# Patient Record
Sex: Female | Born: 1970
Health system: Southern US, Community
[De-identification: ages and names within clinical notes are randomized; demographics above are authoritative.]

## PROBLEM LIST (undated history)

## (undated) DIAGNOSIS — E876 Hypokalemia: Secondary | ICD-10-CM

## (undated) DIAGNOSIS — K219 Gastro-esophageal reflux disease without esophagitis: Secondary | ICD-10-CM

## (undated) DIAGNOSIS — J302 Other seasonal allergic rhinitis: Secondary | ICD-10-CM

## (undated) DIAGNOSIS — Z9109 Other allergy status, other than to drugs and biological substances: Secondary | ICD-10-CM

## (undated) DIAGNOSIS — R45851 Suicidal ideations: Secondary | ICD-10-CM

## (undated) DIAGNOSIS — N739 Female pelvic inflammatory disease, unspecified: Secondary | ICD-10-CM

## (undated) DIAGNOSIS — F419 Anxiety disorder, unspecified: Secondary | ICD-10-CM

## (undated) DIAGNOSIS — J45909 Unspecified asthma, uncomplicated: Secondary | ICD-10-CM

## (undated) DIAGNOSIS — G4730 Sleep apnea, unspecified: Secondary | ICD-10-CM

## (undated) DIAGNOSIS — F32A Depression, unspecified: Secondary | ICD-10-CM

## (undated) DIAGNOSIS — N879 Dysplasia of cervix uteri, unspecified: Secondary | ICD-10-CM

## (undated) DIAGNOSIS — N390 Urinary tract infection, site not specified: Secondary | ICD-10-CM

## (undated) DIAGNOSIS — B019 Varicella without complication: Secondary | ICD-10-CM

## (undated) DIAGNOSIS — I10 Essential (primary) hypertension: Secondary | ICD-10-CM

## (undated) DIAGNOSIS — R51 Headache: Secondary | ICD-10-CM

## (undated) DIAGNOSIS — J982 Interstitial emphysema: Secondary | ICD-10-CM

## (undated) DIAGNOSIS — T8859XA Other complications of anesthesia, initial encounter: Secondary | ICD-10-CM

## (undated) DIAGNOSIS — Z8489 Family history of other specified conditions: Secondary | ICD-10-CM

## (undated) DIAGNOSIS — F329 Major depressive disorder, single episode, unspecified: Secondary | ICD-10-CM

## (undated) HISTORY — PX: ABLATION: SHX5711

## (undated) HISTORY — DX: Essential (primary) hypertension: I10

## (undated) HISTORY — DX: Headache: R51

## (undated) HISTORY — DX: Other allergy status, other than to drugs and biological substances: Z91.09

## (undated) HISTORY — PX: REDUCTION MAMMAPLASTY: SUR839

## (undated) HISTORY — DX: Urinary tract infection, site not specified: N39.0

## (undated) HISTORY — PX: TUBAL LIGATION: SHX77

## (undated) HISTORY — DX: Other seasonal allergic rhinitis: J30.2

## (undated) HISTORY — DX: Varicella without complication: B01.9

## (undated) HISTORY — DX: Female pelvic inflammatory disease, unspecified: N73.9

---

## 2011-04-14 HISTORY — PX: BREAST BIOPSY: SHX20

## 2011-12-09 ENCOUNTER — Inpatient Hospital Stay (HOSPITAL_COMMUNITY): Payer: 59

## 2011-12-09 ENCOUNTER — Inpatient Hospital Stay (HOSPITAL_COMMUNITY)
Admission: EM | Admit: 2011-12-09 | Discharge: 2011-12-11 | DRG: 202 | Disposition: A | Discharge status: Home / Self Care | Payer: 59 | Attending: Internal Medicine | Admitting: Internal Medicine

## 2011-12-09 ENCOUNTER — Emergency Department (HOSPITAL_COMMUNITY): Payer: 59

## 2011-12-09 ENCOUNTER — Encounter (HOSPITAL_COMMUNITY): Payer: Self-pay | Admitting: Emergency Medicine

## 2011-12-09 DIAGNOSIS — E872 Acidosis, unspecified: Secondary | ICD-10-CM | POA: Diagnosis present

## 2011-12-09 DIAGNOSIS — J982 Interstitial emphysema: Secondary | ICD-10-CM

## 2011-12-09 DIAGNOSIS — F341 Dysthymic disorder: Secondary | ICD-10-CM

## 2011-12-09 DIAGNOSIS — R000 Tachycardia, unspecified: Secondary | ICD-10-CM

## 2011-12-09 DIAGNOSIS — J069 Acute upper respiratory infection, unspecified: Secondary | ICD-10-CM | POA: Diagnosis present

## 2011-12-09 DIAGNOSIS — Z880 Allergy status to penicillin: Secondary | ICD-10-CM

## 2011-12-09 DIAGNOSIS — F329 Major depressive disorder, single episode, unspecified: Secondary | ICD-10-CM | POA: Diagnosis present

## 2011-12-09 DIAGNOSIS — J45901 Unspecified asthma with (acute) exacerbation: Principal | ICD-10-CM | POA: Diagnosis present

## 2011-12-09 DIAGNOSIS — F411 Generalized anxiety disorder: Secondary | ICD-10-CM | POA: Diagnosis present

## 2011-12-09 DIAGNOSIS — F3289 Other specified depressive episodes: Secondary | ICD-10-CM | POA: Diagnosis present

## 2011-12-09 DIAGNOSIS — F419 Anxiety disorder, unspecified: Secondary | ICD-10-CM | POA: Diagnosis present

## 2011-12-09 DIAGNOSIS — Z833 Family history of diabetes mellitus: Secondary | ICD-10-CM

## 2011-12-09 DIAGNOSIS — E876 Hypokalemia: Secondary | ICD-10-CM

## 2011-12-09 DIAGNOSIS — R0600 Dyspnea, unspecified: Secondary | ICD-10-CM

## 2011-12-09 DIAGNOSIS — Z8249 Family history of ischemic heart disease and other diseases of the circulatory system: Secondary | ICD-10-CM

## 2011-12-09 DIAGNOSIS — Z882 Allergy status to sulfonamides status: Secondary | ICD-10-CM

## 2011-12-09 HISTORY — DX: Anxiety disorder, unspecified: F41.9

## 2011-12-09 HISTORY — DX: Depression, unspecified: F32.A

## 2011-12-09 HISTORY — DX: Major depressive disorder, single episode, unspecified: F32.9

## 2011-12-09 HISTORY — DX: Hypokalemia: E87.6

## 2011-12-09 HISTORY — DX: Unspecified asthma, uncomplicated: J45.909

## 2011-12-09 LAB — CBC WITH DIFFERENTIAL/PLATELET
Basophils Absolute: 0 10*3/uL (ref 0.0–0.1)
Basophils Relative: 0 % (ref 0–1)
Eosinophils Absolute: 0 K/uL (ref 0.0–0.7)
Eosinophils Relative: 0 % (ref 0–5)
HCT: 41.1 % (ref 36.0–46.0)
Hemoglobin: 14.4 g/dL (ref 12.0–15.0)
Lymphocytes Relative: 3 % — ABNORMAL LOW (ref 12–46)
Lymphs Abs: 0.4 K/uL — ABNORMAL LOW (ref 0.7–4.0)
MCH: 29.8 pg (ref 26.0–34.0)
MCHC: 35 g/dL (ref 30.0–36.0)
MCV: 84.9 fL (ref 78.0–100.0)
Monocytes Absolute: 0 K/uL — ABNORMAL LOW (ref 0.1–1.0)
Monocytes Relative: 0 % — ABNORMAL LOW (ref 3–12)
Neutro Abs: 13.3 10*3/uL — ABNORMAL HIGH (ref 1.7–7.7)
Neutrophils Relative %: 97 % — ABNORMAL HIGH (ref 43–77)
Platelets: 313 10*3/uL (ref 150–400)
RBC: 4.84 MIL/uL (ref 3.87–5.11)
RDW: 12.7 % (ref 11.5–15.5)
WBC: 13.7 10*3/uL — ABNORMAL HIGH (ref 4.0–10.5)

## 2011-12-09 LAB — POCT I-STAT 3, ART BLOOD GAS (G3+)
Acid-base deficit: 6 mmol/L — ABNORMAL HIGH (ref 0.0–2.0)
Bicarbonate: 21.3 mEq/L (ref 20.0–24.0)
O2 Saturation: 90 %
TCO2: 23 mmol/L (ref 0–100)
pCO2 arterial: 46 mmHg — ABNORMAL HIGH (ref 35.0–45.0)
pH, Arterial: 7.273 — ABNORMAL LOW (ref 7.350–7.450)
pO2, Arterial: 66 mmHg — ABNORMAL LOW (ref 80.0–100.0)

## 2011-12-09 LAB — CARBOXYHEMOGLOBIN
Carboxyhemoglobin: 0.8 % (ref 0.5–1.5)
Methemoglobin: 0.9 % (ref 0.0–1.5)
O2 Saturation: 90.3 %
Total hemoglobin: 14.7 g/dL (ref 12.0–16.0)

## 2011-12-09 LAB — BASIC METABOLIC PANEL
CO2: 20 mEq/L (ref 19–32)
Chloride: 100 mEq/L (ref 96–112)
Creatinine, Ser: 0.56 mg/dL (ref 0.50–1.10)
GFR calc Af Amer: >90 mL/min (ref >90)
Sodium: 138 mEq/L (ref 135–145)

## 2011-12-09 LAB — BASIC METABOLIC PANEL WITH GFR
BUN: 7 mg/dL (ref 6–23)
Calcium: 9 mg/dL (ref 8.4–10.5)
GFR calc non Af Amer: >90 mL/min (ref >90)
Glucose, Bld: 203 mg/dL — ABNORMAL HIGH (ref 70–99)
Potassium: 2.8 meq/L — ABNORMAL LOW (ref 3.5–5.1)

## 2011-12-09 LAB — D-DIMER, QUANTITATIVE: D-Dimer, Quant: 1.01 ug/mL-FEU — ABNORMAL HIGH (ref 0.00–0.48)

## 2011-12-09 MED ORDER — POTASSIUM CHLORIDE CRYS ER 20 MEQ PO TBCR
20.0000 meq | EXTENDED_RELEASE_TABLET | Freq: Once | ORAL | Status: DC
  Filled 2011-12-09: qty 1

## 2011-12-09 MED ORDER — RACEPINEPHRINE HCL 2.25 % IN NEBU: 0.5000 mL | INHALATION_SOLUTION | Freq: Once | RESPIRATORY_TRACT | Status: DC

## 2011-12-09 MED ORDER — HYDROMORPHONE HCL PF 1 MG/ML IJ SOLN
1.0000 mg | Freq: Once | INTRAMUSCULAR | Status: AC
  Administered 2011-12-09: 1 mg via INTRAVENOUS

## 2011-12-09 MED ORDER — ALBUTEROL SULFATE (5 MG/ML) 0.5% IN NEBU
2.5000 mg | INHALATION_SOLUTION | RESPIRATORY_TRACT | Status: DC
  Filled 2011-12-09 (×2): qty 0.5

## 2011-12-09 MED ORDER — ACETAMINOPHEN 650 MG RE SUPP: 650.0000 mg | Freq: Four times a day (QID) | RECTAL | Status: DC | PRN

## 2011-12-09 MED ORDER — IPRATROPIUM BROMIDE 0.02 % IN SOLN
RESPIRATORY_TRACT | Status: AC
  Filled 2011-12-09: qty 2.5

## 2011-12-09 MED ORDER — SODIUM CHLORIDE 0.9 % IV SOLN
INTRAVENOUS | Status: DC
  Administered 2011-12-10 (×2): via INTRAVENOUS

## 2011-12-09 MED ORDER — VENLAFAXINE HCL ER 75 MG PO CP24
75.0000 mg | ORAL_CAPSULE | Freq: Every day | ORAL | Status: DC
  Filled 2011-12-09 (×2): qty 1

## 2011-12-09 MED ORDER — ENOXAPARIN SODIUM 40 MG/0.4ML ~~LOC~~ SOLN
40.0000 mg | SUBCUTANEOUS | Status: DC
  Administered 2011-12-09 – 2011-12-10 (×2): 40 mg via SUBCUTANEOUS
  Filled 2011-12-09 (×3): qty 0.4

## 2011-12-09 MED ORDER — HYDROMORPHONE HCL PF 1 MG/ML IJ SOLN
0.5000 mg | INTRAMUSCULAR | Status: DC | PRN
  Administered 2011-12-09 – 2011-12-11 (×5): 0.5 mg via INTRAVENOUS
  Filled 2011-12-09 (×5): qty 1

## 2011-12-09 MED ORDER — LORAZEPAM 2 MG/ML IJ SOLN
1.0000 mg | Freq: Once | INTRAMUSCULAR | Status: AC
  Administered 2011-12-09: 1 mg via INTRAVENOUS
  Filled 2011-12-09: qty 1

## 2011-12-09 MED ORDER — IOHEXOL 350 MG/ML SOLN
80.0000 mL | Freq: Once | INTRAVENOUS | Status: AC | PRN
Start: 2011-12-09 — End: 2011-12-09
  Administered 2011-12-09: 80 mL via INTRAVENOUS

## 2011-12-09 MED ORDER — ACETAMINOPHEN 325 MG PO TABS: 650.0000 mg | ORAL_TABLET | Freq: Four times a day (QID) | ORAL | Status: DC | PRN

## 2011-12-09 MED ORDER — POTASSIUM CHLORIDE CRYS ER 20 MEQ PO TBCR
40.0000 meq | EXTENDED_RELEASE_TABLET | Freq: Two times a day (BID) | ORAL | Status: DC
  Administered 2011-12-09: 40 meq via ORAL
  Filled 2011-12-09 (×3): qty 2

## 2011-12-09 MED ORDER — HYDROMORPHONE HCL PF 1 MG/ML IJ SOLN
0.5000 mg | INTRAMUSCULAR | Status: DC | PRN
  Filled 2011-12-09: qty 1

## 2011-12-09 MED ORDER — IPRATROPIUM BROMIDE 0.02 % IN SOLN
0.5000 mg | Freq: Four times a day (QID) | RESPIRATORY_TRACT | Status: AC
  Administered 2011-12-09 – 2011-12-10 (×2): 0.5 mg via RESPIRATORY_TRACT
  Filled 2011-12-09 (×2): qty 2.5

## 2011-12-09 MED ORDER — INFLUENZA VIRUS VACC SPLIT PF IM SUSP
0.5000 mL | INTRAMUSCULAR | Status: AC
  Administered 2011-12-10: 0.5 mL via INTRAMUSCULAR
  Filled 2011-12-09: qty 0.5

## 2011-12-09 MED ORDER — ALBUTEROL SULFATE (5 MG/ML) 0.5% IN NEBU
2.5000 mg | INHALATION_SOLUTION | Freq: Four times a day (QID) | RESPIRATORY_TRACT | Status: AC
  Administered 2011-12-09 – 2011-12-10 (×2): 2.5 mg via RESPIRATORY_TRACT
  Filled 2011-12-09 (×2): qty 0.5

## 2011-12-09 MED ORDER — RACEPINEPHRINE HCL 2.25 % IN NEBU
INHALATION_SOLUTION | RESPIRATORY_TRACT | Status: AC
  Administered 2011-12-09: 0.5 mL via RESPIRATORY_TRACT
  Filled 2011-12-09: qty 0.5

## 2011-12-09 MED ORDER — IPRATROPIUM BROMIDE 0.02 % IN SOLN
0.5000 mg | RESPIRATORY_TRACT | Status: DC
  Filled 2011-12-09 (×2): qty 2.5

## 2011-12-09 MED ORDER — IPRATROPIUM BROMIDE 0.02 % IN SOLN
0.5000 mg | RESPIRATORY_TRACT | Status: DC | PRN
  Administered 2011-12-09 (×2): 0.5 mg via RESPIRATORY_TRACT

## 2011-12-09 MED ORDER — ALBUTEROL SULFATE (5 MG/ML) 0.5% IN NEBU
2.5000 mg | INHALATION_SOLUTION | RESPIRATORY_TRACT | Status: DC | PRN
  Administered 2011-12-10 – 2011-12-11 (×2): 2.5 mg via RESPIRATORY_TRACT
  Filled 2011-12-09 (×4): qty 0.5

## 2011-12-09 MED ORDER — ALBUTEROL (5 MG/ML) CONTINUOUS INHALATION SOLN
INHALATION_SOLUTION | RESPIRATORY_TRACT | Status: AC
  Filled 2011-12-09: qty 20

## 2011-12-09 MED ORDER — ALBUTEROL SULFATE (5 MG/ML) 0.5% IN NEBU
5.0000 mg | INHALATION_SOLUTION | Freq: Once | RESPIRATORY_TRACT | Status: AC
  Administered 2011-12-09: 5 mg via RESPIRATORY_TRACT
  Filled 2011-12-09: qty 1

## 2011-12-09 MED ORDER — RACEPINEPHRINE HCL 2.25 % IN NEBU
0.5000 mL | INHALATION_SOLUTION | RESPIRATORY_TRACT | Status: AC
  Administered 2011-12-09: 0.5 mL via RESPIRATORY_TRACT

## 2011-12-09 MED ORDER — SODIUM CHLORIDE 0.9 % IV SOLN
Freq: Once | INTRAVENOUS | Status: AC
  Administered 2011-12-09: 16:00:00 via INTRAVENOUS

## 2011-12-09 MED ORDER — IPRATROPIUM BROMIDE 0.02 % IN SOLN
0.5000 mg | RESPIRATORY_TRACT | Status: DC | PRN
  Administered 2011-12-10 – 2011-12-11 (×3): 0.5 mg via RESPIRATORY_TRACT
  Filled 2011-12-09 (×3): qty 2.5

## 2011-12-09 MED ORDER — ALBUTEROL SULFATE (5 MG/ML) 0.5% IN NEBU
2.5000 mg | INHALATION_SOLUTION | RESPIRATORY_TRACT | Status: DC | PRN
  Administered 2011-12-09 (×2): 2.5 mg via RESPIRATORY_TRACT

## 2011-12-09 NOTE — ED Notes (Signed)
Pt transported to CT ?

## 2011-12-09 NOTE — ED Notes (Signed)
Called to CT ref. Pt having breathing diff.  O2 sats 86% on 3LPM .  Increased 02 to 4LPM .  Pt was able to tolerate CT. But continues to be restless.  St's she can't get enough air.

## 2011-12-09 NOTE — ED Notes (Signed)
MD at bedside. 

## 2011-12-09 NOTE — H&P (Signed)
Hospital Admission Note Date: 12/09/2011  Patient name: Nancy Barnes Medical record number: [id number] Date of birth: 08-28-1970 Age: 41 y.o. Gender: female PCP: Donzetta Sprung, MD  Medical Service: [facility]  Attending physician: Dr. Dalphine Handing  1st Contact: Dr. Heloise Beecham   Pager: [phone] 2nd Contact: Dr. Bosie Clos   Pager: [phone] After 5 pm or weekends: 1st Contact:  Intern on call   Pager: [phone] 2nd Contact:  Resident on call  Pager: [phone]  Chief Complaint: Shortness of breath  History of Present Illness: Nancy Barnes is a 41 year old female with past medical history of poorly controlled asthma as well as depression who presents with acute worsening of shortness of breath. History is limited due to patient's shortness of breath during examination. History was augmented by discussion with parents at bedside as well as outside records from [facility].  Nancy Barnes reports that she has had cold-like symptoms for the past 3 days, including stuffy nose and mild cough. Over the same period of time, she is noticed increasing shortness of breath necessitating more frequent use of Combivent inhaler for asthma. She reports that she was diagnosed with asthma 20 years ago, but has never had pulmonary function tests performed. She denies any previous hospitalizations or intubations for asthma attack. She has had several ED visits for asthma exacerbation. She reports that her asthma has been subacutely worsening over the past year, requiring her to use her Combivent inhaler now multiple times daily and even nightly during the week. She's not on any inhaled steroid maintenance therapy at home. This morning, she woke acutely short of breath around 5 AM and presented to [facility], [facility] local hospital, for evaluation. She was treated with Solu-Medrol, albuterol/ipratropium nebulizer treatments, and azithromycin. She had a chest x-ray at the time, and there were no abnormalities reported from that  film. She returned home, but continued to feel very uncomfortable and short of breath, so she presented to [facility] ED. She denies any acute chest pain, but says she feels very uncomfortable breathing. She denies any recent fever, chills, nausea, vomiting, lower chimney edema, headache, dizziness, weakness, numbness or tingling.    Meds: Current Outpatient Rx  Name  Route  Sig  Dispense  Refill  . ALBUTEROL SULFATE (2.5 MG/3ML) 0.083% IN NEBU   Nebulization   Take 2.5 mg by nebulization every 4 (four) hours as needed. For shortness of breath         . IPRATROPIUM-ALBUTEROL 18-103 MCG/ACT IN AERO   Inhalation   Inhale 2 puffs into the lungs every 6 (six) hours as needed. For shortness of breath         . DESVENLAFAXINE SUCCINATE ER 50 MG PO TB24   Oral   Take 50 mg by mouth daily.           Allergies: Allergies as of 12/09/2011 - Review Complete 12/09/2011  Allergen Reaction Noted  . Nsaids Shortness Of Breath 12/09/2011  . Fish allergy Other (See Comments) 12/09/2011  . Levaquin (levofloxacin in d5w) Other (See Comments) 12/09/2011  . Other Other (See Comments) 12/09/2011  . Penicillins Other (See Comments) 12/09/2011  . Prednisone Other (See Comments) 12/09/2011  . Sulfa antibiotics Other (See Comments) 12/09/2011   Past Medical History  Diagnosis Date  . Asthma   . Hypokalemia    Past Surgical History  Procedure Date  . Tubal ligation   . Ablation    History reviewed. No pertinent family history. History   Social History  . Marital Status: Divorced  Spouse Name: N/A    Number of Children: N/A  . Years of Education: N/A   Occupational History  . Not on file.   Social History Main Topics  . Smoking status: Not on file  . Smokeless tobacco: Not on file  . Alcohol Use: No  . Drug Use: No  . Sexually Active: Not on file   Other Topics Concern  . Not on file   Social History Narrative  . No narrative on file    Review of Systems: 10 pt ROS  performed, pertinent positives and negatives noted in HPI  Physical Exam: Blood pressure 122/99, pulse 119, resp. rate 30, last menstrual period 12/09/2006, SpO2 95.00%. Vitals reviewed. General: female sitting up in bed, leaning forward, distressed appearing, rapid breathing HEENT: PERRL, EOMI, no scleral icterus. + Neck crepitus Cardiac: tachycardic to 120s with regular rate, no rubs, murmurs or gallops Pulm: Mild accessory muscle use w retractions, prominent decreased breath sounds over entire L lung fields. R lung field with soft bilateral inspiratory and expiratory wheezes but good air movement. No stridor.  Abd: soft, nontender, nondistended, BS present Ext: warm and well perfused, no pedal edema Neuro: alert and oriented X3, cranial nerves II-XII grossly intact, strength and sensation to light touch equal in bilateral upper and lower extremities  Lab results: Basic Metabolic Panel:  Basename 12/09/11 1240  NA 138  K 2.8*  CL 100  CO2 20  GLUCOSE 203*  BUN 7  CREATININE 0.56  CALCIUM 9.0  MG --  PHOS --   CBC:  Basename 12/09/11 1240  WBC 13.7*  NEUTROABS 13.3*  HGB 14.4  HCT 41.1  MCV 84.9  PLT 313   D-Dimer:  Basename 12/09/11 1432  DDIMER 1.01*   Imaging results:  Ct Angio Chest W/cm &/or Wo Cm  12/09/2011  *RADIOLOGY REPORT*  Clinical Data: Severe shortness of breath.  Cough.  Chest pain.  CT ANGIOGRAPHY CHEST  Technique:  Multidetector CT imaging of the chest using the standard protocol during bolus administration of intravenous contrast. Multiplanar reconstructed images including MIPs were obtained and reviewed to evaluate the vascular anatomy.  Contrast: 80mL OMNIPAQUE IOHEXOL 350 MG/ML SOLN  Comparison: No prior CT.  Two-view chest x-ray [facility] and portable chest x-ray [facility], both obtained earlier same date.  Findings: Since examinations earlier today, the patient has developed extensive pneumomediastinum, with gas tracking  upward into the central neck.  I do not identify a discrete defect within the trachea, mainstem bronchi, or the visualized gas filled esophagus.  There is no mediastinal fluid.  Contrast opacification of the pulmonary arteries is very good. Respiratory motion blurred images of the lung bases.  Overall, study is of good diagnostic quality for the detection of pulmonary emboli.  No filling defects within either main pulmonary artery or their branches in either lung to suggest pulmonary embolism.  Heart size normal.  No pericardial effusion.  No convincing pneumopericardium. No visible thoracic or upper abdominal atherosclerosis.  No visible coronary calcification.  Pulmonary parenchyma clear without localized airspace consolidation, interstitial disease, or parenchymal nodules or masses.  No pleural effusions.  No pneumothorax.  Visualized upper abdomen unremarkable.  No evidence of pneumoperitoneum or pneumoretroperitoneum.  Note is made of a simple cyst in the central liver.  IMPRESSION:  1.  Interval development of extensive pneumomediastinum since the chest x-rays earlier today. There is subcutaneous emphysema in the central neck as well.  There is no evidence of pneumothorax, pneumoperitoneum, or pneumoretroperitoneum. 2.  No acute cardiopulmonary disease otherwise. 3.  No evidence of pulmonary embolism.  These results were called by telephone on 12/09/2011 at 1634 hours to Dr. Judd Lien of the emergency department, who verbally acknowledged these results.   Original Report Authenticated By: Hulan Saas, M.D.    Dg Chest Port 1 View  12/09/2011  *RADIOLOGY REPORT*  Clinical Data: Shortness of breath all day.  Mid chest pain.  PORTABLE CHEST - 1 VIEW  Comparison: Chest x-ray 12/09/2011.  Findings: Lung volumes are normal.  No consolidative airspace disease.  No pleural effusions.  No pneumothorax.  No pulmonary nodule or mass noted.  Pulmonary vasculature and the cardiomediastinal silhouette are within normal  limits.  IMPRESSION: 1. No radiographic evidence of acute cardiopulmonary disease.   Original Report Authenticated By: Trudie Reed, M.D.    Dg Chest Port 1 View  12/09/2011  *RADIOLOGY REPORT*  Clinical Data: The chest pain and productive cough.  PORTABLE CHEST - 1 VIEW  Comparison: 12/09/2011.  Findings: The cardiac silhouette, mediastinal and hilar contours are normal and stable.  The lungs are clear.  No pleural effusion. The bony thorax is intact  IMPRESSION: Normal chest x-ray.   Original Report Authenticated By: Rudie Meyer, M.D.     Other results: EKG: Sinus tachycardia w rate 133. Normal axis and intervals, no acute or chronic ischemic changes visualized.  Assessment & Plan by Problem: Ms. Roulette is a 41 year old female with past medical history of poorly controlled asthma who presents with asthma exacerbation complicated by pneumomediastinum.   1) Asthma exacerbation complicated by pneumomediastinum Patient presents with what sounds like acute on subacute worsening of her asthma control. This current exacerbation was possibly precipitated by viral upper respiratory infection per history; however patient with very poor prior asthma control. She's never had pulmonary function tests, and her only medications have included Combivent inhaler. She's not any inhaled maintenance steroid therapy. She reports that for the last year she's been using her Combivent inhaler multiple times per day. Over the past week she reports using, the inhaler more than 3 times daily and even waking at night to use it. She is noted to have an elevated d-dimer in the ED, and CT of the chest was ordered to investigate for possible pulmonary embolism. No pulmonary embolus and was found, but CT did reveal extensive pneumomediastinum and subcutaneous emphysema. When we evaluated her, she was satting 96-100% on 4 L , but appeared uncomfortable. Also of concern was markedly decreased breath sounds over the entire left  lung field. She is hemodynamically stable. While we were in the room, cardiothoracic surgery (Dr. Dorris Fetch) and Pulmonology/Critical Care (Dr. Sung Amabile) came to evaluate the patient.  Per cardiothoracic surgery, there is no indication for surgical intervention unless the patient's respiratory status acutely decompensates. Pulm/Critical Care recommended repeating chest x-ray now and every 6 hours. Dr. Sung Amabile also recommened providing nonrebreather at 100% which will hasten the resorption of the pneumomediastinum. Treatment should be titrated to pneumomediastinum resorption and not oxygenation. We will treat her for asthma exacerbation was scheduled and when necessary albuterol and ipratropium nebs. There is not much wheezing on her examination. -Cardiothoracic surgery and pulmonology on board as described, precervical recommendations -Chest x-ray now and every 6 hours to monitor for worsening pneumomediastinum or tension pneumothorax. May require emergent needle decompression or chest tube placement expanding. -Scheduled nebulizer treatments every 6 hours and prn treatments every 3 hours -Nonrebreather with goal pneumomediastinum resorption and maximum oxygenation -Will continue to monitor for any evidence of hemodynamic compromise  in the step down unit -Will need to be discharged on daily inhaled corticosteroids and scheduled for pulmonary function tests after acute illness resolves   2) Combined acid/base disturbance Patient with pH of 7.27. Has AG acidosis w gap of 18 and delta-delta of 0.3 w PCO2 of 46, indicating concomitant respiratory acidosis. The respiratory acidosis can be explained by retention in setting of acute asthma exacerbation. The etiology of the anion gap acidosis is not clear at this point. She does not have any known diabetic disease. Her urine has been collected yet for ketones. Denies alcohol. Did not have a lactic acid ordered in ED. -Will treat respiratory acidosis as per  #1 -Will investigate anion gap metabolic acidosis by ordering lactate, urinalysis look for ketones, and salicylate level.  3) Hypokalemia  Patient noted to have potassium of 2.8 at [facility] this morning which was the same on repeat lab draw here in the emergency room. She received 20 mEq of potassium in ED. Etiology of hyperkalemia not clear this point. Possibly from heavy albuterol use. -Will replace orally as needed   4) Depression/anxiety Patient acknowledges increasing anxiety today up from her baseline which is not unexpected given her current condition. She does report increasing frequency of anxiety episodes at home, multiple times a week.  - Will need better outpatient followup of her depression and anxiety.  her current presentation.   Dispo: Disposition is deferred at this time, awaiting improvement of current medical problems. Anticipated discharge in approximately 2 day(s).   The patient does have a current PCP (DANIEL, TERRY, MD), therefore will not be requiring OPC follow-up after discharge.   The patient does not have transportation limitations that hinder transportation to clinic appointments.  Signed: Bronson Curb 12/09/2011, 6:53 PM

## 2011-12-09 NOTE — Consult Note (Signed)
Reason for Consult:Pneumomediastinum Referring Physician: Dr. Patrcia Dolly Nancy Barnes is an 41 y.o. female.  HPI: 41 yo WF with history of asthma brought to ED by EMS with a cc/o Cp and SOB. She is a nonsmoker with a history of asthma. Her asthma symptoms had been worsening over the past several days prior to admission. She was taking cold medication and inhalers at home without much relief.   Last night her shortness of breath worsened even more and after a severe coughing spell she developed chest pain radiating to her back. She went to the [facility] ED and was treated with steroids and nebulizers. A CXR was done. She was then discharged with nebs and a Z-pak.  She had to call EMS almost as soon as she got home. EMS gave her 2 more nebulizers, one nebulized racemic epinephrine and transported her to the [facility] ED. EMS noted that her sat was 88% and she appeared pale and a little cyanotic. She says the treatments helped "a little" but she still has pain and SOB. She feels like she can't get a complete breath in. She had a CT of the chest to rule out a PE and was found to have a pneumomediastinum.    Past Medical History  Diagnosis Date  . Asthma   . Hypokalemia     Past Surgical History  Procedure Date  . Tubal ligation   . Ablation     History reviewed. No pertinent family history.  Social History:  does not have a smoking history on file. She does not have any smokeless tobacco history on file. She reports that she does not drink alcohol or use illicit drugs.  Allergies:  Allergies  Allergen Reactions  . Nsaids Shortness Of Breath  . Fish Allergy Other (See Comments)    Reaction unknown  . Levaquin (Levofloxacin In D5w) Other (See Comments)    Reaction unknown  . Other Other (See Comments)    Reaction unknown  . Penicillins Other (See Comments)    Reaction unknown  . Prednisone Other (See Comments)    Reaction unknown  . Sulfa Antibiotics Other (See Comments)    Reaction  unknown    Medications:  Albuterol Atrovent Dilaudid Potassium chloride  Results for orders placed during the hospital encounter of 12/09/11 (from the past 48 hour(s))  CBC WITH DIFFERENTIAL     Status: Abnormal   Collection Time   12/09/11 12:40 PM      Component Value Range Comment   WBC 13.7 (*) 4.0 - 10.5 K/uL    RBC 4.84  3.87 - 5.11 MIL/uL    Hemoglobin 14.4  12.0 - 15.0 g/dL    HCT 16.1  09.6 - 04.5 %    MCV 84.9  78.0 - 100.0 fL    MCH 29.8  26.0 - 34.0 pg    MCHC 35.0  30.0 - 36.0 g/dL    RDW 40.9  81.1 - 91.4 %    Platelets 313  150 - 400 K/uL    Neutrophils Relative 97 (*) 43 - 77 %    Neutro Abs 13.3 (*) 1.7 - 7.7 K/uL    Lymphocytes Relative 3 (*) 12 - 46 %    Lymphs Abs 0.4 (*) 0.7 - 4.0 K/uL    Monocytes Relative 0 (*) 3 - 12 %    Monocytes Absolute 0.0 (*) 0.1 - 1.0 K/uL    Eosinophils Relative 0  0 - 5 %    Eosinophils Absolute 0.0  0.0 -  0.7 K/uL    Basophils Relative 0  0 - 1 %    Basophils Absolute 0.0  0.0 - 0.1 K/uL   BASIC METABOLIC PANEL     Status: Abnormal   Collection Time   12/09/11 12:40 PM      Component Value Range Comment   Sodium 138  135 - 145 mEq/L    Potassium 2.8 (*) 3.5 - 5.1 mEq/L    Chloride 100  96 - 112 mEq/L    CO2 20  19 - 32 mEq/L    Glucose, Bld 203 (*) 70 - 99 mg/dL    BUN 7  6 - 23 mg/dL    Creatinine, Ser 1.61  0.50 - 1.10 mg/dL    Calcium 9.0  8.4 - 09.6 mg/dL    GFR calc non Af Amer >90  >90 mL/min    GFR calc Af Amer >90  >90 mL/min   D-DIMER, QUANTITATIVE     Status: Abnormal   Collection Time   12/09/11  2:32 PM      Component Value Range Comment   D-Dimer, Quant 1.01 (*) 0.00 - 0.48 ug/mL-FEU   CARBOXYHEMOGLOBIN     Status: Normal   Collection Time   12/09/11  3:55 PM      Component Value Range Comment   Total hemoglobin 14.7  12.0 - 16.0 g/dL    O2 Saturation 04.5      Carboxyhemoglobin 0.8  0.5 - 1.5 %    Methemoglobin 0.9  0.0 - 1.5 %   POCT I-STAT 3, BLOOD GAS (G3+)     Status: Abnormal    Collection Time   12/09/11  3:58 PM      Component Value Range Comment   pH, Arterial 7.273 (*) 7.350 - 7.450    pCO2 arterial 46.0 (*) 35.0 - 45.0 mmHg    pO2, Arterial 66.0 (*) 80.0 - 100.0 mmHg    Bicarbonate 21.3  20.0 - 24.0 mEq/L    TCO2 23  0 - 100 mmol/L    O2 Saturation 90.0      Acid-base deficit 6.0 (*) 0.0 - 2.0 mmol/L    Collection site RADIAL, ALLEN'S TEST ACCEPTABLE      Drawn by Operator      Sample type ARTERIAL       Ct Angio Chest W/cm &/or Wo Cm  12/09/2011  *RADIOLOGY REPORT*  Clinical Data: Severe shortness of breath.  Cough.  Chest pain.  CT ANGIOGRAPHY CHEST  Technique:  Multidetector CT imaging of the chest using the standard protocol during bolus administration of intravenous contrast. Multiplanar reconstructed images including MIPs were obtained and reviewed to evaluate the vascular anatomy.  Contrast: 80mL OMNIPAQUE IOHEXOL 350 MG/ML SOLN  Comparison: No prior CT.  Two-view chest x-ray [facility] and portable chest x-ray [facility], both obtained earlier same date.  Findings: Since examinations earlier today, the patient has developed extensive pneumomediastinum, with gas tracking upward into the central neck.  I do not identify a discrete defect within the trachea, mainstem bronchi, or the visualized gas filled esophagus.  There is no mediastinal fluid.  Contrast opacification of the pulmonary arteries is very good. Respiratory motion blurred images of the lung bases.  Overall, study is of good diagnostic quality for the detection of pulmonary emboli.  No filling defects within either main pulmonary artery or their branches in either lung to suggest pulmonary embolism.  Heart size normal.  No pericardial effusion.  No convincing pneumopericardium. No visible  thoracic or upper abdominal atherosclerosis.  No visible coronary calcification.  Pulmonary parenchyma clear without localized airspace consolidation, interstitial disease, or parenchymal  nodules or masses.  No pleural effusions.  No pneumothorax.  Visualized upper abdomen unremarkable.  No evidence of pneumoperitoneum or pneumoretroperitoneum.  Note is made of a simple cyst in the central liver.  IMPRESSION:  1.  Interval development of extensive pneumomediastinum since the chest x-rays earlier today. There is subcutaneous emphysema in the central neck as well.  There is no evidence of pneumothorax, pneumoperitoneum, or pneumoretroperitoneum. 2.  No acute cardiopulmonary disease otherwise. 3.  No evidence of pulmonary embolism.  These results were called by telephone on 12/09/2011 at 1634 hours to Dr. Judd Lien of the emergency department, who verbally acknowledged these results.   Original Report Authenticated By: Nancy Barnes, M.D.    Dg Chest Port 1 View  12/09/2011  *RADIOLOGY REPORT*  Clinical Data: The chest pain and productive cough.  PORTABLE CHEST - 1 VIEW  Comparison: 12/09/2011.  Findings: The cardiac silhouette, mediastinal and hilar contours are normal and stable.  The lungs are clear.  No pleural effusion. The bony thorax is intact  IMPRESSION: Normal chest x-ray.   Original Report Authenticated By: Rudie Meyer, M.D.     Review of Systems  Constitutional: Negative for fever and chills.  Respiratory: Positive for cough, shortness of breath and wheezing. Negative for hemoptysis.   Cardiovascular: Positive for chest pain.   Blood pressure 122/99, pulse 119, resp. rate 30, last menstrual period 12/09/2006, SpO2 95.00%. Physical Exam  Vitals reviewed. Constitutional: She is oriented to person, place, and time. She appears well-developed and well-nourished. She appears distressed (mild).  HENT:  Head: Normocephalic and atraumatic.  Eyes: EOM are normal. Pupils are equal, round, and reactive to light.  Neck: Neck supple. No tracheal deviation present. No thyromegaly present.  Cardiovascular: Normal rate, regular rhythm and normal heart sounds.   Respiratory: Stridor  present. She is in respiratory distress. She has wheezes (right upper).       Increased WOB  GI: Soft. There is no tenderness.  Musculoskeletal: She exhibits no edema.  Lymphadenopathy:    She has no cervical adenopathy.  Neurological: She is alert and oriented to person, place, and time. No cranial nerve deficit.  Skin: Skin is warm and dry.    Assessment/Plan: 41 yo with a prolonged, severe exacerbation of her asthma who has a pneumomediastinum. She relates that the pain started immediately after a severe coughing spell. She has not had any difficulty with swallowing and denies vomiting. This is classic for a ruptured bleb with air dissecting back along the airway into the mediastinum. Treatment is control of the underlying asthma as well as pain medication to control the pain.   She is being seen by Dr. Sung Amabile from Pulmonary/ Critical care who will manage the asthma. Would follow with serial CXR overnight to make sure she does not develop a pneumothorax  Graeden Bitner C 12/09/2011, 6:40 PM

## 2011-12-09 NOTE — ED Provider Notes (Addendum)
History     CSN: [id number]  Arrival date & time 12/09/11  1206   First MD Initiated Contact with Patient 12/09/11 1210      Chief Complaint  Patient presents with  . Respiratory Distress    (Consider location/radiation/quality/duration/timing/severity/associated sxs/prior treatment) HPI Comments: Level 5 caveat due to respiratory distress.  Pt is brought by EMS from home due to respiratroy distress.  Pt has h/o asthma, doesn't smoke, has had pneumonia 2 times in the past, was worse over the past few days due to cold symptoms, has been taking meds at home with no sig relief.  Went to the local ED last night, was given steroids, 3 nebs, had CXR performed, discharged with meds and z pak, but she reports not feeling much improved.  She had to call EMS as soon as she got home again.  EMS gave her 2 more nebs, one nebulized epi and transported to [facility].  She denies recent long distance travel, chest tightness is present.  No fevers or chills.  Per EMS, at home, her RA sat was 88% and she appeared pale, somewhat cyanotic.  She improved en route with EMS meds.  Pt reports feeling somewhat improved.  On O2 currently, sats are 100%.    The history is provided by the patient and the EMS personnel.    Past Medical History  Diagnosis Date  . Asthma   . Hypokalemia     Past Surgical History  Procedure Date  . Tubal ligation   . Ablation     History reviewed. No pertinent family history.  History  Substance Use Topics  . Smoking status: Not on file  . Smokeless tobacco: Not on file  . Alcohol Use: No    OB History    Grav Para Term Preterm Abortions TAB SAB Ect Mult Living                  Review of Systems  Unable to perform ROS: Unstable vital signs    Allergies  Nsaids; Fish allergy; Levaquin; Other; Penicillins; Prednisone; and Sulfa antibiotics  Home Medications   Current Outpatient Rx  Name  Route  Sig  Dispense  Refill  . ALBUTEROL SULFATE (2.5 MG/3ML) 0.083% IN  NEBU   Nebulization   Take 2.5 mg by nebulization every 4 (four) hours as needed. For shortness of breath         . IPRATROPIUM-ALBUTEROL 18-103 MCG/ACT IN AERO   Inhalation   Inhale 2 puffs into the lungs every 6 (six) hours as needed. For shortness of breath         . DESVENLAFAXINE SUCCINATE ER 50 MG PO TB24   Oral   Take 50 mg by mouth daily.           BP 152/94  Pulse 131  Resp 28  SpO2 94%  LMP 12/09/2006  Physical Exam  Nursing note and vitals reviewed. Constitutional: She is oriented to person, place, and time. She appears well-developed and well-nourished. She appears distressed.  HENT:  Head: Normocephalic and atraumatic.  Eyes: Pupils are equal, round, and reactive to light.  Neck: Normal range of motion. Neck supple.  Cardiovascular: Regular rhythm, normal heart sounds and intact distal pulses.   No extrasystoles are present. Tachycardia present.   No murmur heard. Pulmonary/Chest: Accessory muscle usage present. Tachypnea noted. She has no decreased breath sounds. She has no wheezes. She has no rhonchi. She has no rales.  Abdominal: She exhibits no distension. There is no  tenderness.  Musculoskeletal: She exhibits no tenderness.  Neurological: She is oriented to person, place, and time. No cranial nerve deficit.  Skin: Skin is warm and dry. No rash noted. She is not diaphoretic. No pallor.  Psychiatric: She has a normal mood and affect.    ED Course  Procedures (including critical care time)  Labs Reviewed  CBC WITH DIFFERENTIAL - Abnormal; Notable for the following:    WBC 13.7 (*)     Neutrophils Relative 97 (*)     Neutro Abs 13.3 (*)     Lymphocytes Relative 3 (*)     Lymphs Abs 0.4 (*)     Monocytes Relative 0 (*)     Monocytes Absolute 0.0 (*)     All other components within normal limits  BASIC METABOLIC PANEL - Abnormal; Notable for the following:    Potassium 2.8 (*)     Glucose, Bld 203 (*)     All other components within normal limits   D-DIMER, QUANTITATIVE - Abnormal; Notable for the following:    D-Dimer, Quant 1.01 (*)     All other components within normal limits  CARBOXYHEMOGLOBIN   Dg Chest Port 1 View  12/09/2011  *RADIOLOGY REPORT*  Clinical Data: The chest pain and productive cough.  PORTABLE CHEST - 1 VIEW  Comparison: 12/09/2011.  Findings: The cardiac silhouette, mediastinal and hilar contours are normal and stable.  The lungs are clear.  No pleural effusion. The bony thorax is intact  IMPRESSION: Normal chest x-ray.   Original Report Authenticated By: Rudie Meyer, M.D.      1. Dyspnea   2. Hypokalemia   3. Sinus tachycardia     sats are 100% on O2 which is adequate on my interpretation.   ECG at time 13:00 shows sinus tachycardia at rate 133, normal axis, no ST or T wave abn's.    2:16 PM RT was unaware of ABG order.  For past 1.5 hours, pt's sats remain at 100% now on [place] O2 at 2 L.  HR remains at 130.  Will give some IV ativan for anti-anxiety, CXR is normal . No wheezing on exam initially.  I question whether anxiety had larger component of her SOB.  K+ is low, likely due to repeated doses of albuterol.  Pt also  With a known h/o hypokalemia in the past.  Will give some oral replacement here.     3:30 PM DDimer came back elevated at 1.01.  WBC up likely due to steroid given earlier as well as stress.  Lungs continue to be clear.  Will ask for records to be sent from [facility].  No PE risks, but due to continued tachycardia, sensation of dyspnea and + DDimer, will get CT of chest, angio.  Will sign out pt to Dr. Judd Lien to follow up on CT scan and provide definitive disposition.  ABG and CO are pending    MDM  No sig wheezing on my exam here.  No records in EPIC here to review.  Will get ABG, ddimer, CXR, give additional neb.  No risk factors for PE so neg ddimer will r/o PE.  Tachycardia is likely due to anxiety, medication side effects.  Will continue to monitor and obtain ECG as well.           Gavin Pound. Oletta Lamas, MD 12/09/11 1532  Gavin Pound. Nicholi Ghuman, MD 12/09/11 1646

## 2011-12-09 NOTE — ED Notes (Signed)
Patient states "unable to swallow K+ and that she took one this am".  MD notified of same.

## 2011-12-09 NOTE — Consult Note (Signed)
PULMONARY/CCM CONSULT NOTE  Requesting MD/Service: IMTS Date of admission: 11/26 Date of consult: 11/26 Reason for consultation: acute asthma exacerbation, pneumomediastinum  Pt Profile:  41 yo F admitted with acute asthma exac c/b pneumomediastinum   HPI:  41 yo F with hx of asthma and worsening control over several weeks to months presented to [facility] ED on the DOA with 3 days of URI symptoms and increased SOB. She was treated with methyl prednisolone, nebulized BDs and was discharged to home. She had continued symptoms, particularly chest pain and presented to later on this DOA with these symptoms. CT chest revealed pneumomediastinum. PCCM is asked to assist with mgmt of above. She denies F/C/S, purulent sputum, LE edma and calf tenderness  Past Medical History  Diagnosis Date  . Asthma   . Hypokalemia   . Anxiety   . Depression     MEDICATIONS: reviewed  History   Social History  . Marital Status: Divorced    Spouse Name: N/A    Number of Children: N/A  . Years of Education: N/A   Occupational History  . Not on file.   Social History Main Topics  . Smoking status: Never Smoker   . Smokeless tobacco: Not on file  . Alcohol Use: No  . Drug Use: No  . Sexually Active: Not on file   Other Topics Concern  . Not on file   Social History Narrative   Works for [employer]. Lives at home with 2 teenage children. Never smoker.    Family History  Problem Relation Age of Onset  . Heart attack Mother     Bypass x4  . Diabetes Mellitus II Father   . Hypertension Father     ROS - as per HPI. Otherwise a detailed ROS is N/C  Filed Vitals:   12/09/11 2145 12/09/11 2150 12/09/11 2237 12/09/11 2315  BP: 152/97   140/92  Pulse: 122 111  116  Temp: 98.6 F (37 C)     TempSrc: Oral     Resp: 23 31  27   Height: 5' 4.5" (1.638 m)     Weight: 73.6 kg (162 lb 4.1 oz)     SpO2: 96% 95% 97% 95%    EXAM:  Gen: appears uncomfortable but no overt  respiratory distress HEENT: WNL Neck: no JVD, plapable crepitations  Lungs: splinting, diminished BS, scattered distant wheezes Cardiovascular: RRR s M Abdomen: soft, NT, NABS Musculoskeletal: No edema, warm Neuro: grossly intact  DATA:   CXR: NACPD detected  CT chest: pneumomediastinum  All labs reviewed  IMPRESSION:    Pneumomediastinum likely due to asthma exacerbation  Asthma with acute exacerbation  Possible URI, acute Poolry controlled asthma @ baseline   PLAN:  Discussed in detail with Dr Bosie Clos Usual Rx for acute asthma exac - systemic steroids, nebulized BDs, empiric abx, etc Analgesia for chest pain High flow O2 will help speed resolution of pneumomediastinum Serial CXRs q 6 hrs X 2 to ensure stability   Billy Fischer, MD ; [facility] service Mobile [phone].  After 5:30 PM or weekends, call [phone]

## 2011-12-09 NOTE — ED Notes (Signed)
Family at bedside. 

## 2011-12-09 NOTE — Progress Notes (Signed)
Pt admitted from ER via stretcher on 6LPM [place]. Patient on arrival appears in respiratory distress with accessory muscle use and c/o SOB. Placed patient on 100% NRB. Notified RT of patient needing treatment. No family at bedside at this time. Oriented patient to unit and room. Will continue to monitor.

## 2011-12-09 NOTE — ED Notes (Signed)
[phone] Father Shonell Dutchover

## 2011-12-09 NOTE — ED Notes (Signed)
Pt given Dilaudid 1mg  IV for chest pain, st's pain has subsided.  Receiving Neb tx at this time.

## 2011-12-09 NOTE — ED Notes (Signed)
Patient is resting comfortably. 

## 2011-12-09 NOTE — ED Notes (Signed)
RT at bedside.

## 2011-12-09 NOTE — ED Notes (Signed)
Per EMS pt presents with severe respiratory distress. Pt was released from [facility] hospital this morning, was not admitted. Pt was 88% on 10L NRB. EMS gave 5mg  albuterol via neb and 500 mcg atrovent and 0.3mg  Epi 1:1,000 via neb, 125mg  Solumedrol via IV. Most recent SpO2 was 97%. Bp- 154/88 HR-138. Pt has hx of asthma, anxiety, depression.

## 2011-12-10 ENCOUNTER — Inpatient Hospital Stay (HOSPITAL_COMMUNITY): Payer: 59

## 2011-12-10 DIAGNOSIS — J069 Acute upper respiratory infection, unspecified: Secondary | ICD-10-CM

## 2011-12-10 LAB — URINALYSIS, ROUTINE W REFLEX MICROSCOPIC
Glucose, UA: 250 mg/dL — AB
Ketones, ur: 15 mg/dL — AB
Protein, ur: 100 mg/dL — AB

## 2011-12-10 LAB — CBC
HCT: 38.6 % (ref 36.0–46.0)
Hemoglobin: 13 g/dL (ref 12.0–15.0)
RDW: 13.1 % (ref 11.5–15.5)
WBC: 23.1 10*3/uL — ABNORMAL HIGH (ref 4.0–10.5)

## 2011-12-10 LAB — EXPECTORATED SPUTUM ASSESSMENT W GRAM STAIN, RFLX TO RESP C

## 2011-12-10 LAB — BASIC METABOLIC PANEL
Chloride: 104 mEq/L (ref 96–112)
GFR calc Af Amer: >90 mL/min (ref >90)
Potassium: 4.2 mEq/L (ref 3.5–5.1)
Sodium: 139 mEq/L (ref 135–145)

## 2011-12-10 LAB — URINE MICROSCOPIC-ADD ON

## 2011-12-10 MED ORDER — ALBUTEROL SULFATE (5 MG/ML) 0.5% IN NEBU: 2.5000 mg | INHALATION_SOLUTION | Freq: Four times a day (QID) | RESPIRATORY_TRACT | Status: DC

## 2011-12-10 MED ORDER — ALBUTEROL SULFATE (5 MG/ML) 0.5% IN NEBU
5.0000 mg | INHALATION_SOLUTION | Freq: Once | RESPIRATORY_TRACT | Status: AC
  Administered 2011-12-10: 5 mg via RESPIRATORY_TRACT

## 2011-12-10 MED ORDER — METHYLPREDNISOLONE SODIUM SUCC 125 MG IJ SOLR
125.0000 mg | Freq: Once | INTRAMUSCULAR | Status: AC
  Administered 2011-12-10: 125 mg via INTRAVENOUS
  Filled 2011-12-10: qty 2

## 2011-12-10 MED ORDER — AZITHROMYCIN 250 MG PO TABS: 250.0000 mg | ORAL_TABLET | Freq: Every day | ORAL | Status: DC

## 2011-12-10 MED ORDER — IPRATROPIUM BROMIDE 0.02 % IN SOLN
0.5000 mg | Freq: Four times a day (QID) | RESPIRATORY_TRACT | Status: DC
  Administered 2011-12-10 – 2011-12-11 (×4): 0.5 mg via RESPIRATORY_TRACT
  Filled 2011-12-10 (×4): qty 2.5

## 2011-12-10 MED ORDER — IPRATROPIUM BROMIDE 0.02 % IN SOLN: 0.5000 mg | Freq: Four times a day (QID) | RESPIRATORY_TRACT | Status: DC

## 2011-12-10 MED ORDER — POTASSIUM CHLORIDE CRYS ER 20 MEQ PO TBCR
40.0000 meq | EXTENDED_RELEASE_TABLET | Freq: Every day | ORAL | Status: DC
  Administered 2011-12-10 – 2011-12-11 (×2): 40 meq via ORAL
  Filled 2011-12-10: qty 2

## 2011-12-10 MED ORDER — ALBUTEROL SULFATE (5 MG/ML) 0.5% IN NEBU
2.5000 mg | INHALATION_SOLUTION | Freq: Four times a day (QID) | RESPIRATORY_TRACT | Status: DC
  Administered 2011-12-10 – 2011-12-11 (×4): 2.5 mg via RESPIRATORY_TRACT
  Filled 2011-12-10 (×4): qty 0.5

## 2011-12-10 MED ORDER — DESVENLAFAXINE SUCCINATE ER 50 MG PO TB24
50.0000 mg | ORAL_TABLET | Freq: Every day | ORAL | Status: DC
  Administered 2011-12-10: 50 mg via ORAL

## 2011-12-10 MED ORDER — DEXTROSE 5 % IV SOLN
500.0000 mg | INTRAVENOUS | Status: DC
  Administered 2011-12-10: 500 mg via INTRAVENOUS
  Filled 2011-12-10 (×2): qty 500

## 2011-12-10 NOTE — Progress Notes (Signed)
Utilization review completed.  

## 2011-12-10 NOTE — Progress Notes (Signed)
  Subjective: Still c/o difficulty breathing and pain, both still significant but much improved from time of admission. No difficulty swallowing   Objective: Vital signs in last 24 hours: Temp:  [97.5 F (36.4 C)-98.6 F (37 C)] 98.1 F (36.7 C) (11/27 0729) Pulse Rate:  [108-134] 108  (11/27 0340) Cardiac Rhythm:  [-] Sinus tachycardia (11/27 0340) Resp:  [20-33] 20  (11/27 0340) BP: (122-165)/(83-124) 146/94 mmHg (11/27 0729) SpO2:  [89 %-100 %] 100 % (11/27 0744) FiO2 (%):  [100 %] 100 % (11/27 0744) Weight:  [162 lb 4.1 oz (73.6 kg)] 162 lb 4.1 oz (73.6 kg) (11/26 2145)  Hemodynamic parameters for last 24 hours:    Intake/Output from previous day: 11/26 0701 - 11/27 0700 In: 1600 [P.O.:600; I.V.:1000] Out: 370 [Urine:370] Intake/Output this shift: Total I/O In: -  Out: 400 [Urine:400]  General appearance: alert and mild distress Lungs: wheezes bilaterally  Lab Results:  Basename 12/10/11 0435 12/09/11 1240  WBC 23.1* 13.7*  HGB 13.0 14.4  HCT 38.6 41.1  PLT 330 313   BMET:  Basename 12/10/11 0435 12/09/11 1240  NA 139 138  K 4.2 2.8*  CL 104 100  CO2 25 20  GLUCOSE 138* 203*  BUN 11 7  CREATININE 0.54 0.56  CALCIUM 8.9 9.0    PT/INR: No results found for this basename: LABPROT,INR in the last 72 hours ABG    Component Value Date/Time   PHART 7.273* 12/09/2011 1558   HCO3 21.3 12/09/2011 1558   TCO2 23 12/09/2011 1558   ACIDBASEDEF 6.0* 12/09/2011 1558   O2SAT 90.0 12/09/2011 1558   CBG (last 3)  No results found for this basename: GLUCAP:3 in the last 72 hours  Assessment/Plan: S/P   Pneumomediastinum Secondary to acute asthma attack Still wheezing badly but is moving air better than she was last night Suspect leukocytosis due to steroids given in ED CXR still shows pneumomediastinum and some subQ emphysema, no pneumothorax No indication for surgery, please call if we can be of any assitance   LOS: 1 day    HENDRICKSON,STEVEN  C 12/10/2011

## 2011-12-10 NOTE — H&P (Signed)
Internal Medicine Teaching Service Attending Note Date: 12/10/2011  Patient name: Nancy Barnes  Medical record number: [id number]  Date of birth: 12-13-1970   Chief Complaint: Shortness of breath . History of Present Illness The patient, Nancy Barnes, is a 41 y.o. year old female who comes in with the chief complaint of shortness of breath and discomfort breathing.The patient had not been feeling well for the past few days and went to a local hospital called [facility] where she was treated with Solumedrol, nebulizers and Azithromycin. She returned but did not feel better. She denies chest pain, but feels palpitations. She denies lightheadedness. She has some cough, but denies any fever, nausea, vomiting, dizziness, weakness or numbness. She is a non-smoker.  Past Medical History   has a past medical history of Asthma; Hypokalemia; Anxiety; and Depression.  Medications  Reviewed  Family History family history includes Diabetes Mellitus II in her father; Heart attack in her mother; and Hypertension in her father.  Social History  reports that she has never smoked. She does not have any smokeless tobacco history on file. She reports that she does not drink alcohol or use illicit drugs.  Review of Systems Positive for - shortness of breath, pain during breathing (can't get a complete breath in) Negative for - chest pain, lightheadedness, palpitations, fever chills.   Vital Signs: Filed Vitals:   12/10/11 0729  BP: 146/94  Pulse:   Temp: 98.1 F (36.7 C)  Resp:     Physical Exam:  I met with patient around 9 am today  Vitals reviewed.  General: Resting in bed, non-rebreather on. HEENT: PERRL, EOMI, no scleral icterus, no cervical adenopathy. Heart: Tachycardia no rubs, murmurs or gallops. Lungs: Breath sounds diminished on left side, no wheezes, rales, or rhonchi. Abdomen: Soft, nontender, nondistended, BS present. Extremities: Warm, no pedal edema. Neuro: Alert and  oriented X3, cranial nerves II-XII grossly intact,  strength and sensation to light touch equal in bilateral upper and lower extremities  Lab results: CMP     Component Value Date/Time   NA 139 12/10/2011 0435   K 4.2 12/10/2011 0435   CL 104 12/10/2011 0435   CO2 25 12/10/2011 0435   GLUCOSE 138* 12/10/2011 0435   BUN 11 12/10/2011 0435   CREATININE 0.54 12/10/2011 0435   CALCIUM 8.9 12/10/2011 0435   GFRNONAA >90 12/10/2011 0435   GFRAA >90 12/10/2011 0435   CBC    Component Value Date/Time   WBC 23.1* 12/10/2011 0435   RBC 4.42 12/10/2011 0435   HGB 13.0 12/10/2011 0435   HCT 38.6 12/10/2011 0435   PLT 330 12/10/2011 0435   MCV 87.3 12/10/2011 0435   MCH 29.4 12/10/2011 0435   MCHC 33.7 12/10/2011 0435   RDW 13.1 12/10/2011 0435   LYMPHSABS 0.4* 12/09/2011 1240   MONOABS 0.0* 12/09/2011 1240   EOSABS 0.0 12/09/2011 1240   BASOSABS 0.0 12/09/2011 1240   DDIMER 1.01*   Urinalysis    Component Value Date/Time   COLORURINE YELLOW 12/10/2011 0145   APPEARANCEUR CLEAR 12/10/2011 0145   LABSPEC 1.025 12/10/2011 0145   PHURINE 5.5 12/10/2011 0145   GLUCOSEU 250* 12/10/2011 0145   HGBUR TRACE* 12/10/2011 0145   BILIRUBINUR NEGATIVE 12/10/2011 0145   KETONESUR 15* 12/10/2011 0145   PROTEINUR 100* 12/10/2011 0145   UROBILINOGEN 0.2 12/10/2011 0145   NITRITE NEGATIVE 12/10/2011 0145   LEUKOCYTESUR NEGATIVE 12/10/2011 0145     Imaging results:  Ct Angio Chest W/cm &/or Wo Cm  12/09/2011  *  RADIOLOGY REPORT*  Clinical Data: Severe shortness of breath.  Cough.  Chest pain.  CT ANGIOGRAPHY CHEST  Technique:  Multidetector CT imaging of the chest using the standard protocol during bolus administration of intravenous contrast. Multiplanar reconstructed images including MIPs were obtained and reviewed to evaluate the vascular anatomy.  Contrast: 80mL OMNIPAQUE IOHEXOL 350 MG/ML SOLN  Comparison: No prior CT.  Two-view chest x-ray [facility] and portable chest  x-ray [facility], both obtained earlier same date.  Findings: Since examinations earlier today, the patient has developed extensive pneumomediastinum, with gas tracking upward into the central neck.  I do not identify a discrete defect within the trachea, mainstem bronchi, or the visualized gas filled esophagus.  There is no mediastinal fluid.  Contrast opacification of the pulmonary arteries is very good. Respiratory motion blurred images of the lung bases.  Overall, study is of good diagnostic quality for the detection of pulmonary emboli.  No filling defects within either main pulmonary artery or their branches in either lung to suggest pulmonary embolism.  Heart size normal.  No pericardial effusion.  No convincing pneumopericardium. No visible thoracic or upper abdominal atherosclerosis.  No visible coronary calcification.  Pulmonary parenchyma clear without localized airspace consolidation, interstitial disease, or parenchymal nodules or masses.  No pleural effusions.  No pneumothorax.  Visualized upper abdomen unremarkable.  No evidence of pneumoperitoneum or pneumoretroperitoneum.  Note is made of a simple cyst in the central liver.  IMPRESSION:  1.  Interval development of extensive pneumomediastinum since the chest x-rays earlier today. There is subcutaneous emphysema in the central neck as well.  There is no evidence of pneumothorax, pneumoperitoneum, or pneumoretroperitoneum. 2.  No acute cardiopulmonary disease otherwise. 3.  No evidence of pulmonary embolism.  These results were called by telephone on 12/09/2011 at 1634 hours to Dr. Judd Lien of the emergency department, who verbally acknowledged these results.   Original Report Authenticated By: Hulan Saas, M.D.    Dg Chest Port 1 View  12/10/2011  *RADIOLOGY REPORT*  Clinical Data: Pneumomediastinum  PORTABLE CHEST - 1 VIEW  Comparison: 12/09/2011  Findings: Improving pneumomediastinum and subcutaneous emphysema in the supraclavicular  regions.  No enlarging pneumothorax.  Stable heart size and vascularity.  Lungs remain clear.  No effusion. Trachea is midline.  IMPRESSION: Improving pneumomediastinum and subcutaneous emphysema.  No pneumothorax or other acute process   Original Report Authenticated By: Judie Petit. Miles Costain, M.D.    Dg Chest Port 1 View  12/09/2011  *RADIOLOGY REPORT*  Clinical Data: Known pneumomediastinum; assess for pneumothorax.  PORTABLE CHEST - 1 VIEW  Comparison: Chest radiograph and CTA of the chest performed earlier today, at 06:30 p.m. and 04:18 p.m.  Findings: Pneumomediastinum is again noted, with scattered soft tissue air tracking along the neck.  There is no definite evidence of pneumothorax.  The lungs appear grossly clear.  No focal consolidation or pleural effusion is seen.  The cardiomediastinal silhouette is normal in size.  No acute osseous abnormalities are identified.  IMPRESSION: Pneumomediastinum again noted; no definite evidence of pneumothorax.   Original Report Authenticated By: Tonia Ghent, M.D.    Dg Chest Port 1 View  12/09/2011  *RADIOLOGY REPORT*  Clinical Data: Shortness of breath all day.  Mid chest pain.  PORTABLE CHEST - 1 VIEW  Comparison: Chest x-ray 12/09/2011.  Findings: Lung volumes are normal.  No consolidative airspace disease.  No pleural effusions.  No pneumothorax.  No pulmonary nodule or mass noted.  Pulmonary vasculature and the cardiomediastinal silhouette are  within normal limits.  IMPRESSION: 1. No radiographic evidence of acute cardiopulmonary disease.   Original Report Authenticated By: Trudie Reed, M.D.    Dg Chest Port 1 View  12/09/2011  *RADIOLOGY REPORT*  Clinical Data: The chest pain and productive cough.  PORTABLE CHEST - 1 VIEW  Comparison: 12/09/2011.  Findings: The cardiac silhouette, mediastinal and hilar contours are normal and stable.  The lungs are clear.  No pleural effusion. The bony thorax is intact  IMPRESSION: Normal chest x-ray.   Original Report  Authenticated By: Rudie Meyer, M.D.     Assessment and Plan:  Asthma Exacerbation with pneumomediastinum. The patient is a 41 year old lady who has complicated asthma at this point - pneumomediastinum after a suspected coughing spell during her asthma exacerbation. From her last exam per my resident, I feel that there is improvement in her movement of air in her left lung. We are doing usual asthma management with solumedrol and nebs. CTA Chest has ruled out PE. Critical care/Pulmomology is already on board and we have followed their recommendations. CT Surgery contacted and followed their recommendations.   Rest chronic problem management, per Dr. Wyvonnia Dusky note.   Thanks, Aletta Edouard, MD 11/27/201311:46 AM

## 2011-12-10 NOTE — Progress Notes (Signed)
Subjective: Pt sitting up in bed finishing breakfast when I enter. Satting 82-85% on 4L nasal cannula while eating. 100% NRB put back on after finished breakfast, sats up to 96-100%.  Breathing much easier, able to speak in full sentences, nondistressed. Still with chest pain with deep inspiration.  Denies N/V, dizziness, abdominal pain, diarrhea. No dysphagia.  Objective: Vital signs in last 24 hours: Filed Vitals:   12/10/11 0340 12/10/11 0400 12/10/11 0729 12/10/11 0744  BP: 127/85  146/94   Pulse: 108     Temp:  97.5 F (36.4 C) 98.1 F (36.7 C)   TempSrc:  Oral Oral   Resp: 20     Height:      Weight:      SpO2: 100%   100%   Weight change:   Intake/Output Summary (Last 24 hours) at 12/10/11 0957 Last data filed at 12/10/11 0729  Gross per 24 hour  Intake   1600 ml  Output    770 ml  Net    830 ml   Vitals reviewed. General: female sitting in bed, non-distressed. Speaking in full sentences HEENT: PERRL, EOMI, no scleral icterus.   Cardiac: tachycardic to 120-140s with regular rate, no rubs, murmurs or gallops  Pulm: No accessory muscle use. Interval improvement in aeration of L lung field. Bilateral soft inspiratory and expiratory wheezes. No stridor.   Abd: soft, nontender, nondistended, BS present  Ext: warm and well perfused, no pedal edema  Neuro: alert and oriented X3, cranial nerves II-XII grossly intact, strength and sensation to light touch equal in bilateral upper and lower extremities  Lab Results: Basic Metabolic Panel:  Lab 12/10/11 1610 12/09/11 1240  NA 139 138  K 4.2 2.8*  CL 104 100  CO2 25 20  GLUCOSE 138* 203*  BUN 11 7  CREATININE 0.54 0.56  CALCIUM 8.9 9.0  MG -- --  PHOS -- --   CBC:  Lab 12/10/11 0435 12/09/11 1240  WBC 23.1* 13.7*  NEUTROABS -- 13.3*  HGB 13.0 14.4  HCT 38.6 41.1  MCV 87.3 84.9  PLT 330 313   D-Dimer:  Lab 12/09/11 1432  DDIMER 1.01*   Urinalysis:  Lab 12/10/11 0145  COLORURINE YELLOW  LABSPEC 1.025    PHURINE 5.5  GLUCOSEU 250*  HGBUR TRACE*  BILIRUBINUR NEGATIVE  KETONESUR 15*  PROTEINUR 100*  UROBILINOGEN 0.2  NITRITE NEGATIVE  LEUKOCYTESUR NEGATIVE    Micro Results: Recent Results (from the past 240 hour(s))  MRSA PCR SCREENING     Status: Normal   Collection Time   12/09/11 10:15 PM      Component Value Range Status Comment   MRSA by PCR NEGATIVE  NEGATIVE Final    Studies/Results: Ct Angio Chest W/cm &/or Wo Cm  12/09/2011  *RADIOLOGY REPORT*  Clinical Data: Severe shortness of breath.  Cough.  Chest pain.  CT ANGIOGRAPHY CHEST  Technique:  Multidetector CT imaging of the chest using the standard protocol during bolus administration of intravenous contrast. Multiplanar reconstructed images including MIPs were obtained and reviewed to evaluate the vascular anatomy.  Contrast: 80mL OMNIPAQUE IOHEXOL 350 MG/ML SOLN  Comparison: No prior CT.  Two-view chest x-ray [facility] and portable chest x-ray [facility], both obtained earlier same date.  Findings: Since examinations earlier today, the patient has developed extensive pneumomediastinum, with gas tracking upward into the central neck.  I do not identify a discrete defect within the trachea, mainstem bronchi, or the visualized gas filled esophagus.  There is  no mediastinal fluid.  Contrast opacification of the pulmonary arteries is very good. Respiratory motion blurred images of the lung bases.  Overall, study is of good diagnostic quality for the detection of pulmonary emboli.  No filling defects within either main pulmonary artery or their branches in either lung to suggest pulmonary embolism.  Heart size normal.  No pericardial effusion.  No convincing pneumopericardium. No visible thoracic or upper abdominal atherosclerosis.  No visible coronary calcification.  Pulmonary parenchyma clear without localized airspace consolidation, interstitial disease, or parenchymal nodules or masses.  No pleural effusions.   No pneumothorax.  Visualized upper abdomen unremarkable.  No evidence of pneumoperitoneum or pneumoretroperitoneum.  Note is made of a simple cyst in the central liver.  IMPRESSION:  1.  Interval development of extensive pneumomediastinum since the chest x-rays earlier today. There is subcutaneous emphysema in the central neck as well.  There is no evidence of pneumothorax, pneumoperitoneum, or pneumoretroperitoneum. 2.  No acute cardiopulmonary disease otherwise. 3.  No evidence of pulmonary embolism.  These results were called by telephone on 12/09/2011 at 1634 hours to Dr. Judd Lien of the emergency department, who verbally acknowledged these results.   Original Report Authenticated By: Hulan Saas, M.D.    Dg Chest Port 1 View  12/10/2011  *RADIOLOGY REPORT*  Clinical Data: Pneumomediastinum  PORTABLE CHEST - 1 VIEW  Comparison: 12/09/2011  Findings: Improving pneumomediastinum and subcutaneous emphysema in the supraclavicular regions.  No enlarging pneumothorax.  Stable heart size and vascularity.  Lungs remain clear.  No effusion. Trachea is midline.  IMPRESSION: Improving pneumomediastinum and subcutaneous emphysema.  No pneumothorax or other acute process   Original Report Authenticated By: Judie Petit. Miles Costain, M.D.    Dg Chest Port 1 View  12/09/2011  *RADIOLOGY REPORT*  Clinical Data: Known pneumomediastinum; assess for pneumothorax.  PORTABLE CHEST - 1 VIEW  Comparison: Chest radiograph and CTA of the chest performed earlier today, at 06:30 p.m. and 04:18 p.m.  Findings: Pneumomediastinum is again noted, with scattered soft tissue air tracking along the neck.  There is no definite evidence of pneumothorax.  The lungs appear grossly clear.  No focal consolidation or pleural effusion is seen.  The cardiomediastinal silhouette is normal in size.  No acute osseous abnormalities are identified.  IMPRESSION: Pneumomediastinum again noted; no definite evidence of pneumothorax.   Original Report Authenticated By:  Tonia Ghent, M.D.    Dg Chest Port 1 View  12/09/2011  *RADIOLOGY REPORT*  Clinical Data: Shortness of breath all day.  Mid chest pain.  PORTABLE CHEST - 1 VIEW  Comparison: Chest x-ray 12/09/2011.  Findings: Lung volumes are normal.  No consolidative airspace disease.  No pleural effusions.  No pneumothorax.  No pulmonary nodule or mass noted.  Pulmonary vasculature and the cardiomediastinal silhouette are within normal limits.  IMPRESSION: 1. No radiographic evidence of acute cardiopulmonary disease.   Original Report Authenticated By: Trudie Reed, M.D.    Dg Chest Port 1 View  12/09/2011  *RADIOLOGY REPORT*  Clinical Data: The chest pain and productive cough.  PORTABLE CHEST - 1 VIEW  Comparison: 12/09/2011.  Findings: The cardiac silhouette, mediastinal and hilar contours are normal and stable.  The lungs are clear.  No pleural effusion. The bony thorax is intact  IMPRESSION: Normal chest x-ray.   Original Report Authenticated By: Rudie Meyer, M.D.    Medications: I have reviewed the patient's current medications. Scheduled Meds:   . [COMPLETED] sodium chloride   Intravenous Once  . [COMPLETED] albuterol  2.5 mg Nebulization  Q6H  . albuterol  2.5 mg Nebulization Q6H  . [COMPLETED] albuterol  5 mg Nebulization Once  . [COMPLETED] albuterol  5 mg Nebulization Once  . desvenlafaxine  50 mg Oral Daily  . enoxaparin (LOVENOX) injection  40 mg Subcutaneous Q24H  . [COMPLETED]  HYDROmorphone (DILAUDID) injection  1 mg Intravenous Once  . influenza  inactive virus vaccine  0.5 mL Intramuscular Tomorrow-1000  . [COMPLETED] ipratropium  0.5 mg Nebulization Q6H  . ipratropium  0.5 mg Nebulization Q6H  . [COMPLETED] LORazepam  1 mg Intravenous Once  . methylPREDNISolone (SOLU-MEDROL) injection  125 mg Intravenous Once  . potassium chloride SA  40 mEq Oral Daily  . [COMPLETED] Racepinephrine HCl  0.5 mL Nebulization STAT  . [DISCONTINUED] albuterol  2.5 mg Nebulization Q4H  .  [DISCONTINUED] ipratropium  0.5 mg Nebulization Q4H  . [DISCONTINUED] potassium chloride  20 mEq Oral Once  . [DISCONTINUED] potassium chloride SA  40 mEq Oral BID  . [DISCONTINUED] Racepinephrine HCl  0.5 mL Nebulization Once  . [DISCONTINUED] venlafaxine XR  75 mg Oral Daily   Continuous Infusions:   . sodium chloride 100 mL/hr at 12/10/11 0722   PRN Meds:.acetaminophen, acetaminophen, albuterol, HYDROmorphone (DILAUDID) injection, [COMPLETED] iohexol, ipratropium, [DISCONTINUED] albuterol, [DISCONTINUED]  HYDROmorphone (DILAUDID) injection, [DISCONTINUED] ipratropium  Assessment/Plan: 1) Asthma exacerbation complicated by pneumomediastinum  Asthma exacerbation improving with solumedrol, duonebs scheduled an prn. Pt much more comfortable. Satting 96-100% on NRB, no distress. Chest Xrays q6h x3 overnight with no obvious pneumothorax. Better interval aeration of L lung, still w bilateral wheezing.  CVTS (Dr. Joya Gaskins) and PCCM (Dr. Sung Amabile) following. - Solumedrol 125mg  IV, duonebs q6 hours scheduled and q3 hours prn - Continue 100% NRB for goal of resorption of pneumomediastinum w hyperoxygenation -Will continue to monitor for any evidence of hemodynamic compromise in the step down unit  -Will need to be discharged on daily inhaled corticosteroids and scheduled for pulmonary function tests after acute illness resolves  - No abx for acute asthma exacerbation.   2) Leukocytosis Suspect 2/2 IV steroids. Will continue to monitor for clinical signs/symptoms of infection.  3) Combined acid/base disturbance  Resolved.  On admission, patient with pH of 7.27 and AG acidosis w gap of 18 and delta-delta of 0.3 w PCO2 of 46, indicating concomitant respiratory acidosis. The respiratory acidosis can be explained by retention in setting of acute asthma exacerbation. She also had elevated lactic acid which explains her anion gap. Repeat anion gap this morning is normal.  3) Hypokalemia  Patient  noted to have potassium of 2.8 in ED, possibly in setting of albuterol use. Replaced orally, repeat is 4.2 this morning. - Kdur 40mg  daily  4) Depression/anxiety  Patient acknowledges increasing anxiety today up from her baseline which is not unexpected given her current condition. She does report increasing frequency of anxiety episodes at home, multiple times a week. Prefers to take home Pristiq over formulary Effexor. - Pt will bring in meds from home to take while in hospital   Dispo: Disposition is deferred at this time, awaiting improvement of current medical problems.  Anticipated discharge in approximately 1-2 day(s).   The patient does not have a current PCP (DANIEL, TERRY, MD), therefore will not be requiring [facility]PC follow-up after discharge.   The patient does have transportation limitations that hinder transportation to clinic appointments.  .Services Needed at time of discharge: Y = Yes, Blank = No PT:   OT:   RN:   Equipment:   Other:  LOS: 1 day   Bronson Curb 12/10/2011, 9:57 AM

## 2011-12-10 NOTE — Progress Notes (Addendum)
PULMONARY/CCM CONSULT NOTE  Requesting MD/Service: IMTS Date of admission: 11/26 Date of consult: 11/26 Reason for consultation: acute asthma exacerbation, pneumomediastinum  Pt Profile:  41 yo F admitted with acute asthma exac c/b pneumomediastinum  Subjective/Overnight:  Very SOB this am. Wants more frequent BD.  Looks like they have been d/c? Increased sputum production - purulent   Filed Vitals:   12/10/11 0400 12/10/11 0729 12/10/11 0744 12/10/11 1146  BP:  146/94    Pulse:      Temp: 97.5 F (36.4 C) 98.1 F (36.7 C)  98 F (36.7 C)  TempSrc: Oral Oral  Oral  Resp:      Height:      Weight:      SpO2:   100%     EXAM:  Gen: appears uncomfortable but no overt respiratory distress HEENT: WNL Neck: no JVD, plapable crepitations  Lungs: resps even, mildly labored, mildly tachypneic, significant wheeze throughout, few scattered ronchi L>R  Cardiovascular: RRR s M Abdomen: soft, NT, NABS Musculoskeletal: No edema, warm Neuro: grossly intact  DATA:   CXR: 11/27>> improved  CT chest: pneumomediastinum  All labs reviewed  IMPRESSION:    Pneumomediastinum likely due to asthma exacerbation/couging  Asthma with acute exacerbation  Possible URI, acute Poorly controlled asthma @ baseline   PLAN:  Usual Rx for acute asthma exac - systemic steroids, nebulized BDs, empiric abx, etc On d/c needs asthma controller (Advair, etc) but cont scheduled nebs for now with cont bronchospasm, change to q4 Cont IV solumedrol Analgesia for chest pain High flow O2 will help speed resolution of pneumomediastinum F/u CXR in am  Will need outpt pulm f/u  -- have arranged outpt appt with Dr. Shelle Iron 12/10 Sputum culture  Consider empiric abx for ?purulent bronchitis    WHITEHEART,KATHRYN, NP 12/10/2011  12:21 PM Pager: [phone] or [phone]      I have interviewed and examined the patient and reviewed the database. I have formulated the assessment and plan as  reflected in the note above with amendments made by me. She looks much better but is still wheezing. Chest pain seems markedly improved. From her on out, would treat as any other asthma exacerbation. She will need a controller medication after discharge - advair or symbicort. She has F/U with [facility] as above. PCCM will sign off. Please call if we can be of further assistance  Billy Fischer, MD;  PCCM service; Mobile [phone]

## 2011-12-11 ENCOUNTER — Inpatient Hospital Stay (HOSPITAL_COMMUNITY): Payer: 59

## 2011-12-11 LAB — CBC WITH DIFFERENTIAL/PLATELET
Basophils Absolute: 0 10*3/uL (ref 0.0–0.1)
Basophils Relative: 0 % (ref 0–1)
Eosinophils Relative: 0 % (ref 0–5)
HCT: 38.6 % (ref 36.0–46.0)
MCHC: 32.4 g/dL (ref 30.0–36.0)
MCV: 87.9 fL (ref 78.0–100.0)
Monocytes Absolute: 0.4 10*3/uL (ref 0.1–1.0)
Platelets: 313 10*3/uL (ref 150–400)
RDW: 13 % (ref 11.5–15.5)
WBC: 15.9 10*3/uL — ABNORMAL HIGH (ref 4.0–10.5)

## 2011-12-11 LAB — BASIC METABOLIC PANEL
Calcium: 8.8 mg/dL (ref 8.4–10.5)
Creatinine, Ser: 0.51 mg/dL (ref 0.50–1.10)
GFR calc Af Amer: >90 mL/min (ref >90)
GFR calc non Af Amer: >90 mL/min (ref >90)
Sodium: 141 mEq/L (ref 135–145)

## 2011-12-11 MED ORDER — AZITHROMYCIN 250 MG PO TABS: ORAL_TABLET | ORAL | Status: DC

## 2011-12-11 MED ORDER — FLUTICASONE-SALMETEROL 500-50 MCG/DOSE IN AEPB: 1.0000 | INHALATION_SPRAY | Freq: Two times a day (BID) | RESPIRATORY_TRACT | Status: DC

## 2011-12-11 MED ORDER — IPRATROPIUM-ALBUTEROL 0.5-2.5 (3) MG/3ML IN SOLN: 3.0000 mL | Freq: Four times a day (QID) | RESPIRATORY_TRACT | Status: DC | PRN

## 2011-12-11 MED ORDER — ALBUTEROL SULFATE HFA 108 (90 BASE) MCG/ACT IN AERS: 2.0000 | INHALATION_SPRAY | RESPIRATORY_TRACT | Status: DC | PRN

## 2011-12-11 NOTE — Discharge Summary (Signed)
Internal Medicine Teaching [facility] Discharge Note  Name: Nancy Barnes MRN: [id number] DOB: 08/20/1970 41 y.o.  Date of Admission: 12/09/2011 12:06 PM Date of Discharge: 12/12/2011 Attending Physician: Aletta Edouard  Discharge Diagnosis: Asthma exacerbation complicated by pneumomediastinum Combined acid/base disturbance Hypokalemia Depression/Anxiety  Discharge Medications:   Medication List     As of 12/12/2011  6:27 PM    TAKE these medications         albuterol 108 (90 BASE) MCG/ACT inhaler   Commonly known as: PROVENTIL HFA;VENTOLIN HFA   Inhale 2 puffs into the lungs every 2 (two) hours as needed for wheezing or shortness of breath (cough).      albuterol (2.5 MG/3ML) 0.083% nebulizer solution   Commonly known as: PROVENTIL   Take 2.5 mg by nebulization every 4 (four) hours as needed. For shortness of breath      azithromycin 250 MG tablet   Commonly known as: ZITHROMAX   2 po day one, then 1 daily x 4 days      Fluticasone-Salmeterol 500-50 MCG/DOSE Aepb   Commonly known as: ADVAIR   Inhale 1 puff into the lungs 2 (two) times daily.      ipratropium-albuterol 0.5-2.5 (3) MG/3ML Soln   Commonly known as: DUONEB   Take 3 mLs by nebulization every 6 (six) hours as needed.      albuterol-ipratropium 18-103 MCG/ACT inhaler   Commonly known as: COMBIVENT   Inhale 2 puffs into the lungs every 6 (six) hours as needed. For shortness of breath      PRISTIQ 50 MG 24 hr tablet   Generic drug: desvenlafaxine   Take 50 mg by mouth daily.         Disposition and follow-up:   [name] was discharged from [facility] in good condition.  At the hospital follow up visit please address   1) Asthma control Patient was discharged on daily maintenance therapy with inhaled corticosteroid which she had not been on previously. Will ultimately need PFTs when acute illness resolves as pt has never had in the past. Please assess disease control on  current therapy.   2) Pneumomediastinum Patient will need repeat CXR to look for resolution of pneumomediastinum. She has an appointment with pulmonology on 12/23/11.   3) Hypokalemia Pt had potassium of 2.8 on admission, rose to 4.2 with oral supplementation x1. Possibly low in setting of heavy albuterol usage. Please recheck potassium level at follow-up appointment and assess need for replacement.   4) Anxiety Patient feels is not well controlled on Pristiq. Will need closer follow-up as outpatient.    Follow-up Appointments: Follow-up Information    Follow up with Barbaraann Share, MD. On 12/23/2011. (PULMONOLOGY - Your appointment is on 12/23/2011 at 11:00am )    Contact information:   [address] [place] [address] [phone]       Follow up with Donzetta Sprung, MD. On 12/18/2011. (PRIMARY CARE PHYSICIAN - Your appointment is on 12/18/2011 at 9:45AM)    Contact information:   [address] [place] [address] [phone]         Discharge Orders    Future Appointments: Provider: Department: Dept Phone: Center:   12/23/2011 11:00 AM Barbaraann Share, MD  Pulmonary Care [phone] None     Future Orders Please Complete By Expires   Diet - low sodium heart healthy      Increase activity slowly      Discharge instructions  Comments:   1. Please start using advair inhaler, 1 puff into the lungs twice a day, EVERY DAY, NO MATTER HOW YOU FEEL.  2. Please use the duoneb treatment every 6 hours ONLY IF YOU HAVE WHEEZING OR SHORTNESS OF BREATH on your home nebulizer. Use your combivent inhaler as a rescue inhaler to use if your symptoms get out of control. 3. Please take azithromycin for 5 more days as prescribed. 4. Please follow-up with your primary care doctor, Dr. Aurther Loft, on Thursday, December 5th at 9:30 am 5. Please follow-up with Dr. Shelle Iron with pulmonology on 12/23/11 at 11:00 am 6. Please come back to the Emergency Room if your symptoms of shortness  of breath and chest pain get dramatically worse, or if your symptoms do not respond to nebulizer/inhaler therapy.   Call MD for:  difficulty breathing, headache or visual disturbances      Call MD for:  severe uncontrolled pain          Consultations:  Pulm/Critical Care - Dr. Sung Amabile Cardiothoracic Surgery - Dr. Joya Gaskins   Procedures Performed:  Ct Angio Chest W/cm &/or Wo Cm  12/09/2011  *RADIOLOGY REPORT*  Clinical Data: Severe shortness of breath.  Cough.  Chest pain.  CT ANGIOGRAPHY CHEST  Technique:  Multidetector CT imaging of the chest using the standard protocol during bolus administration of intravenous contrast. Multiplanar reconstructed images including MIPs were obtained and reviewed to evaluate the vascular anatomy.  Contrast: 80mL OMNIPAQUE IOHEXOL 350 MG/ML SOLN  Comparison: No prior CT.  Two-view chest x-ray [facility] and portable chest x-ray [facility], both obtained earlier same date.  Findings: Since examinations earlier today, the patient has developed extensive pneumomediastinum, with gas tracking upward into the central neck.  I do not identify a discrete defect within the trachea, mainstem bronchi, or the visualized gas filled esophagus.  There is no mediastinal fluid.  Contrast opacification of the pulmonary arteries is very good. Respiratory motion blurred images of the lung bases.  Overall, study is of good diagnostic quality for the detection of pulmonary emboli.  No filling defects within either main pulmonary artery or their branches in either lung to suggest pulmonary embolism.  Heart size normal.  No pericardial effusion.  No convincing pneumopericardium. No visible thoracic or upper abdominal atherosclerosis.  No visible coronary calcification.  Pulmonary parenchyma clear without localized airspace consolidation, interstitial disease, or parenchymal nodules or masses.  No pleural effusions.  No pneumothorax.  Visualized upper abdomen  unremarkable.  No evidence of pneumoperitoneum or pneumoretroperitoneum.  Note is made of a simple cyst in the central liver.  IMPRESSION:  1.  Interval development of extensive pneumomediastinum since the chest x-rays earlier today. There is subcutaneous emphysema in the central neck as well.  There is no evidence of pneumothorax, pneumoperitoneum, or pneumoretroperitoneum. 2.  No acute cardiopulmonary disease otherwise. 3.  No evidence of pulmonary embolism.  These results were called by telephone on 12/09/2011 at 1634 hours to Dr. Judd Lien of the emergency department, who verbally acknowledged these results.   Original Report Authenticated By: Hulan Saas, M.D.    Dg Chest Port 1 View  12/11/2011  *RADIOLOGY REPORT*  Clinical Data: Asthmatic attack, pneumomediastinum  PORTABLE CHEST - 1 VIEW  Comparison: 12/10/2011; 12/09/2011; chest CT - 12/09/2011  Findings: Unchanged cardiac silhouette and mediastinal contours. Apparent resolution of previously noted pneumomediastinum.  No definite pneumothorax.  Interval development of a small left-sided pleural effusion.  Lung volumes are reduced with worsening basilar opacities.  Unchanged  bones.  IMPRESSION: 1.  Apparent resolution of previously noted pneumomediastinum on this AP portable examination.  Further evaluation with a PA and lateral chest radiograph may be obtained as clinically indicated. 2.  Interval development of small left-sided effusion and bibasilar opacities, atelectasis versus infiltrate.  Continued attention on follow-up is recommended.   Original Report Authenticated By: Tacey Ruiz, MD    Dg Chest Port 1 View  12/10/2011  *RADIOLOGY REPORT*  Clinical Data: Pneumomediastinum  PORTABLE CHEST - 1 VIEW  Comparison: 12/09/2011  Findings: Improving pneumomediastinum and subcutaneous emphysema in the supraclavicular regions.  No enlarging pneumothorax.  Stable heart size and vascularity.  Lungs remain clear.  No effusion. Trachea is midline.   IMPRESSION: Improving pneumomediastinum and subcutaneous emphysema.  No pneumothorax or other acute process   Original Report Authenticated By: Judie Petit. Miles Costain, M.D.    Dg Chest Port 1 View  12/09/2011  *RADIOLOGY REPORT*  Clinical Data: Known pneumomediastinum; assess for pneumothorax.  PORTABLE CHEST - 1 VIEW  Comparison: Chest radiograph and CTA of the chest performed earlier today, at 06:30 p.m. and 04:18 p.m.  Findings: Pneumomediastinum is again noted, with scattered soft tissue air tracking along the neck.  There is no definite evidence of pneumothorax.  The lungs appear grossly clear.  No focal consolidation or pleural effusion is seen.  The cardiomediastinal silhouette is normal in size.  No acute osseous abnormalities are identified.  IMPRESSION: Pneumomediastinum again noted; no definite evidence of pneumothorax.   Original Report Authenticated By: Tonia Ghent, M.D.    Dg Chest Port 1 View  12/09/2011  *RADIOLOGY REPORT*  Clinical Data: Shortness of breath all day.  Mid chest pain.  PORTABLE CHEST - 1 VIEW  Comparison: Chest x-ray 12/09/2011.  Findings: Lung volumes are normal.  No consolidative airspace disease.  No pleural effusions.  No pneumothorax.  No pulmonary nodule or mass noted.  Pulmonary vasculature and the cardiomediastinal silhouette are within normal limits.  IMPRESSION: 1. No radiographic evidence of acute cardiopulmonary disease.   Original Report Authenticated By: Trudie Reed, M.D.    Dg Chest Port 1 View  12/09/2011  *RADIOLOGY REPORT*  Clinical Data: The chest pain and productive cough.  PORTABLE CHEST - 1 VIEW  Comparison: 12/09/2011.  Findings: The cardiac silhouette, mediastinal and hilar contours are normal and stable.  The lungs are clear.  No pleural effusion. The bony thorax is intact  IMPRESSION: Normal chest x-ray.   Original Report Authenticated By: Rudie Meyer, M.D.    Admission HPI:  Ms. Castaneda is a 41 year old female with past medical history of poorly  controlled asthma as well as depression who presents with acute worsening of shortness of breath. History is limited due to patient's shortness of breath during examination. History was augmented by discussion with parents at bedside as well as outside records from [facility].  Ms. Mizenko reports that she has had cold-like symptoms for the past 3 days, including stuffy nose and mild cough. Over the same period of time, she is noticed increasing shortness of breath necessitating more frequent use of Combivent inhaler for asthma. She reports that she was diagnosed with asthma 20 years ago, but has never had pulmonary function tests performed. She denies any previous hospitalizations or intubations for asthma attack. She has had several ED visits for asthma exacerbation. She reports that her asthma has been subacutely worsening over the past year, requiring her to use her Combivent inhaler now multiple times daily and even nightly during the week. She's not on  any inhaled steroid maintenance therapy at home.  This morning, she woke acutely short of breath around 5 AM and presented to [facility], her local hospital, for evaluation. She was treated with Solu-Medrol, albuterol/ipratropium nebulizer treatments, and azithromycin. She had a chest x-ray at the time, and there were no abnormalities reported from that film.  She returned home, but continued to feel very uncomfortable and short of breath, so she presented to [facility] ED. She denies any acute chest pain, but says she feels very uncomfortable breathing. She denies any recent fever, chills, nausea, vomiting, lower chimney edema, headache, dizziness, weakness, numbness or tingling.   Hospital Course by problem list: 1) Asthma exacerbation complicated by pneumomediastinum  Patient presented acute on subacute worsening of her asthma control. This current exacerbation was possibly precipitated by viral upper respiratory infection per history;  however patient with very poor prior asthma control. She had never had pulmonary function tests, and her only medications have included Combivent inhaler. She was not taking any inhaled maintenance steroid therapy prior to admission. She reported that for the last year she's been using her Combivent inhaler multiple times per day. During the week prior to admission, she reported using the inhaler more than 3 times daily and even waking at night to use it.  She was noted to have an elevated d-dimer in the ED, and CT of the chest was ordered to investigate for possible pulmonary embolism. No pulmonary embolus and was found, but CT did reveal extensive pneumomediastinum and subcutaneous emphysema.  When we evaluated her, she was satting 96-100% on 4 L , but appeared uncomfortable. She had increased WOB and accessory muscle use. ABG showed pH of 7.27 and pCO2 of 46. She had soft inspiratory and expiratory wheezes of R lung field. Also of concern was markedly decreased breath sounds over the entire left lung field. She was hemodynamically stable. At time of admisssion, she was evaluated by Cardiothoracic Surgery (Dr. Dorris Fetch) and Pulmonology/Critical Care (Dr. Sung Amabile). Per cardiothoracic surgery, there was no indication for surgical intervention of pneumomediastinum. Pulm/Critical Care recommended repeating chest x-ray every 6 hours x3 during the first day of her admission. Dr. Sung Amabile also recommended providing nonrebreather at 100%, which would hasten the resorption of the pneumomediastinum. Treatment was titrated to pneumomediastinum resorption and not oxygenation.  We treated her with duonebs scheduled every 6 hrs and every 3 hours prn for asthma exacerbation. We also provided solumedrol 125mg  qd and azithromycin. Patient's clinical status improved greatly with no worsening of pneumomediastinum or evidence of tension PTX on sequential chest Xrays. She had no hemodynamic compromise. Her wheezing and aeration  of L lung field also improved. She was treated with 100% NRB oxygen for 2 days for resorption of pneumomediastinum, and then was transitioned to nasal cannula oxygen and then room air on hospital day 3. On day of discharge, she was without any wheezes or increased work of breathing. She was satting 100% on room air and was able to maintain saturations >98% on ambulation. She was discharged on daily inhaled corticosteroid therapy, a 5 day oral course of azithromycin, and refills for her duonebs. She refused prednisone therapy as she reported hive allergy.  She has a follow-up appt with pulmonology on 12/23/11 to monitor resolution of pneumomediastinum  2) Combined acid/base disturbance  On admission, patient with pH of 7.27 and AG acidosis w gap of 18 and delta-delta of 0.3 w PCO2 of 46, indicating concomitant respiratory acidosis. The respiratory acidosis can be explained by retention in  setting of acute asthma exacerbation. She also had elevated lactic acid which explains her anion gap. Repeat metabolic panels demonstrate closed AG.  3) Hypokalemia  Patient noted to have potassium of 2.8 at [facility] the morning of admissoin which was the same on repeat lab draw here in the emergency room. She received 20 mEq of potassium in ED. Etiology of hypokalemia possibly from heavy albuterol use. Was stable at 4.2 on last 2 days of hospitalization with no need for oral replacement.    4) Depression/anxiety  Patient acknowledged increasing anxiety up from her baseline. During admission, she preferred to take home Pristiq over formulary Effexor. She brought in Pristiq from home to take while in hospital. Will need better follow-up as outpatient.  .   Discharge Vitals:  BP 137/93  Pulse 84  Temp 97.6 F (36.4 C) (Oral)  Resp 18  Ht 5' 4.5" (1.638 m)  Wt 162 lb 4.1 oz (73.6 kg)  BMI 27.42 kg/m2  SpO2 99%  LMP 12/09/2006  Discharge Labs:  No results found for this or any previous visit (from the  past 24 hour(s)).  Signed: Bronson Curb 12/12/2011, 6:27 PM   Time Spent on Discharge: 40 min Services Ordered on Discharge: none Equipment Ordered on Discharge: none

## 2011-12-11 NOTE — Progress Notes (Signed)
Patient has been discharged home today. IV was D/C'd. Have reviewed all discharge instructions with the patient, all answers and concerns have been addressed.

## 2011-12-11 NOTE — Progress Notes (Signed)
Patient has called [phone] and is now getting family member to go to  [employer] [facility] Store here in [place] to pick up her Rx.

## 2011-12-11 NOTE — Progress Notes (Signed)
Subjective: Patient with uneventful night and sleeping soundly. This morning satting 100% on nasal cannula. I removed nasal cannula and maintained sats 96-100% on room air. Chest pain is improving, no longer w SOB. No dysphagia.  Denies SOB, N/V, dizziness, abdominal pain, diarrhea. No dysphagia.  Objective: Vital signs in last 24 hours: Filed Vitals:   12/11/11 0409 12/11/11 0800 12/11/11 0816 12/11/11 0859  BP: 132/90 137/93 137/93   Pulse: 100  84   Temp: 98.1 F (36.7 C)  97.6 F (36.4 C)   TempSrc: Oral  Oral   Resp: 21  18   Height:      Weight:      SpO2: 99%  100% 99%   Weight change:   Intake/Output Summary (Last 24 hours) at 12/11/11 0959 Last data filed at 12/11/11 4540  Gross per 24 hour  Intake 4025.83 ml  Output   2400 ml  Net 1625.83 ml   Vitals reviewed. General: Female resting in bed peacefully HEENT: PERRL, EOMI, no scleral icterus.   Cardiac:Regular rate and rhythm, no rubs, murmurs or gallops  Pulm: No accessory muscle use. Good air movement bilateral lung fields. Soft end expiratory wheezes Abd: soft, nontender, nondistended, BS present  Ext: warm and well perfused, no pedal edema  Neuro: alert and oriented X3, cranial nerves II-XII grossly intact, strength and sensation to light touch equal in bilateral upper and lower extremities  Lab Results: Basic Metabolic Panel:  Lab 12/11/11 9811 12/10/11 0435  NA 141 139  K 4.2 4.2  CL 107 104  CO2 27 25  GLUCOSE 138* 138*  BUN 11 11  CREATININE 0.51 0.54  CALCIUM 8.8 8.9  MG -- --  PHOS -- --   CBC:  Lab 12/11/11 0427 12/10/11 0435 12/09/11 1240  WBC 15.9* 23.1* --  NEUTROABS 14.7* -- 13.3*  HGB 12.5 13.0 --  HCT 38.6 38.6 --  MCV 87.9 87.3 --  PLT 313 330 --   D-Dimer:  Lab 12/09/11 1432  DDIMER 1.01*   Urinalysis:  Lab 12/10/11 0145  COLORURINE YELLOW  LABSPEC 1.025  PHURINE 5.5  GLUCOSEU 250*  HGBUR TRACE*  BILIRUBINUR NEGATIVE  KETONESUR 15*  PROTEINUR 100*  UROBILINOGEN  0.2  NITRITE NEGATIVE  LEUKOCYTESUR NEGATIVE    Micro Results: Recent Results (from the past 240 hour(s))  MRSA PCR SCREENING     Status: Normal   Collection Time   12/09/11 10:15 PM      Component Value Range Status Comment   MRSA by PCR NEGATIVE  NEGATIVE Final   CULTURE, EXPECTORATED SPUTUM-ASSESSMENT     Status: Normal   Collection Time   12/10/11  3:37 PM      Component Value Range Status Comment   Specimen Description SPUTUM   Final    Special Requests NONE   Final    Sputum evaluation     Final    Value: THIS SPECIMEN IS ACCEPTABLE. RESPIRATORY CULTURE REPORT TO FOLLOW.   Report Status 12/10/2011 FINAL   Final   CULTURE, RESPIRATORY     Status: Normal (Preliminary result)   Collection Time   12/10/11  3:37 PM      Component Value Range Status Comment   Specimen Description SPUTUM   Final    Special Requests NONE   Final    Gram Stain PENDING   Incomplete    Culture NO GROWTH 1 DAY   Final    Report Status PENDING   Incomplete    Studies/Results: Ct Angio Chest  W/cm &/or Wo Cm  12/09/2011  *RADIOLOGY REPORT*  Clinical Data: Severe shortness of breath.  Cough.  Chest pain.  CT ANGIOGRAPHY CHEST  Technique:  Multidetector CT imaging of the chest using the standard protocol during bolus administration of intravenous contrast. Multiplanar reconstructed images including MIPs were obtained and reviewed to evaluate the vascular anatomy.  Contrast: 80mL OMNIPAQUE IOHEXOL 350 MG/ML SOLN  Comparison: No prior CT.  Two-view chest x-ray [facility] and portable chest x-ray [facility], both obtained earlier same date.  Findings: Since examinations earlier today, the patient has developed extensive pneumomediastinum, with gas tracking upward into the central neck.  I do not identify a discrete defect within the trachea, mainstem bronchi, or the visualized gas filled esophagus.  There is no mediastinal fluid.  Contrast opacification of the pulmonary arteries is very  good. Respiratory motion blurred images of the lung bases.  Overall, study is of good diagnostic quality for the detection of pulmonary emboli.  No filling defects within either main pulmonary artery or their branches in either lung to suggest pulmonary embolism.  Heart size normal.  No pericardial effusion.  No convincing pneumopericardium. No visible thoracic or upper abdominal atherosclerosis.  No visible coronary calcification.  Pulmonary parenchyma clear without localized airspace consolidation, interstitial disease, or parenchymal nodules or masses.  No pleural effusions.  No pneumothorax.  Visualized upper abdomen unremarkable.  No evidence of pneumoperitoneum or pneumoretroperitoneum.  Note is made of a simple cyst in the central liver.  IMPRESSION:  1.  Interval development of extensive pneumomediastinum since the chest x-rays earlier today. There is subcutaneous emphysema in the central neck as well.  There is no evidence of pneumothorax, pneumoperitoneum, or pneumoretroperitoneum. 2.  No acute cardiopulmonary disease otherwise. 3.  No evidence of pulmonary embolism.  These results were called by telephone on 12/09/2011 at 1634 hours to Dr. Judd Lien of the emergency department, who verbally acknowledged these results.   Original Report Authenticated By: Hulan Saas, M.D.    Dg Chest Port 1 View  12/11/2011  *RADIOLOGY REPORT*  Clinical Data: Asthmatic attack, pneumomediastinum  PORTABLE CHEST - 1 VIEW  Comparison: 12/10/2011; 12/09/2011; chest CT - 12/09/2011  Findings: Unchanged cardiac silhouette and mediastinal contours. Apparent resolution of previously noted pneumomediastinum.  No definite pneumothorax.  Interval development of a small left-sided pleural effusion.  Lung volumes are reduced with worsening basilar opacities.  Unchanged bones.  IMPRESSION: 1.  Apparent resolution of previously noted pneumomediastinum on this AP portable examination.  Further evaluation with a PA and lateral chest  radiograph may be obtained as clinically indicated. 2.  Interval development of small left-sided effusion and bibasilar opacities, atelectasis versus infiltrate.  Continued attention on follow-up is recommended.   Original Report Authenticated By: Tacey Ruiz, MD    Dg Chest Port 1 View  12/10/2011  *RADIOLOGY REPORT*  Clinical Data: Pneumomediastinum  PORTABLE CHEST - 1 VIEW  Comparison: 12/09/2011  Findings: Improving pneumomediastinum and subcutaneous emphysema in the supraclavicular regions.  No enlarging pneumothorax.  Stable heart size and vascularity.  Lungs remain clear.  No effusion. Trachea is midline.  IMPRESSION: Improving pneumomediastinum and subcutaneous emphysema.  No pneumothorax or other acute process   Original Report Authenticated By: Judie Petit. Miles Costain, M.D.    Dg Chest Port 1 View  12/09/2011  *RADIOLOGY REPORT*  Clinical Data: Known pneumomediastinum; assess for pneumothorax.  PORTABLE CHEST - 1 VIEW  Comparison: Chest radiograph and CTA of the chest performed earlier today, at 06:30 p.m. and 04:18 p.m.  Findings: Pneumomediastinum is again noted, with scattered soft tissue air tracking along the neck.  There is no definite evidence of pneumothorax.  The lungs appear grossly clear.  No focal consolidation or pleural effusion is seen.  The cardiomediastinal silhouette is normal in size.  No acute osseous abnormalities are identified.  IMPRESSION: Pneumomediastinum again noted; no definite evidence of pneumothorax.   Original Report Authenticated By: Tonia Ghent, M.D.    Dg Chest Port 1 View  12/09/2011  *RADIOLOGY REPORT*  Clinical Data: Shortness of breath all day.  Mid chest pain.  PORTABLE CHEST - 1 VIEW  Comparison: Chest x-ray 12/09/2011.  Findings: Lung volumes are normal.  No consolidative airspace disease.  No pleural effusions.  No pneumothorax.  No pulmonary nodule or mass noted.  Pulmonary vasculature and the cardiomediastinal silhouette are within normal limits.  IMPRESSION: 1.  No radiographic evidence of acute cardiopulmonary disease.   Original Report Authenticated By: Trudie Reed, M.D.    Dg Chest Port 1 View  12/09/2011  *RADIOLOGY REPORT*  Clinical Data: The chest pain and productive cough.  PORTABLE CHEST - 1 VIEW  Comparison: 12/09/2011.  Findings: The cardiac silhouette, mediastinal and hilar contours are normal and stable.  The lungs are clear.  No pleural effusion. The bony thorax is intact  IMPRESSION: Normal chest x-ray.   Original Report Authenticated By: Rudie Meyer, M.D.    Medications: I have reviewed the patient's current medications. Scheduled Meds:    . ipratropium  0.5 mg Nebulization QID   And  . albuterol  2.5 mg Nebulization QID  . azithromycin (ZITHROMAX) 500 MG IVPB  500 mg Intravenous Q24H  . desvenlafaxine  50 mg Oral Daily  . enoxaparin (LOVENOX) injection  40 mg Subcutaneous Q24H  . [COMPLETED] influenza  inactive virus vaccine  0.5 mL Intramuscular Tomorrow-1000  . [COMPLETED] methylPREDNISolone (SOLU-MEDROL) injection  125 mg Intravenous Once  . potassium chloride SA  40 mEq Oral Daily  . [DISCONTINUED] albuterol  2.5 mg Nebulization Q6H  . [DISCONTINUED] azithromycin  250 mg Oral Daily  . [DISCONTINUED] ipratropium  0.5 mg Nebulization Q6H   Continuous Infusions:    . sodium chloride 50 mL/hr at 12/10/11 2028   PRN Meds:.acetaminophen, acetaminophen, albuterol, HYDROmorphone (DILAUDID) injection, ipratropium  Assessment/Plan: 1) Asthma exacerbation complicated by pneumomediastinum  Asthma exacerbation improving with solumedrol, duonebs scheduled an prn. Satting 100% on RA, no distress. Resolution of pneumomediastinum on CXR.  -Will need to be discharged on daily inhaled corticosteroids and scheduled for pulmonary function tests after acute illness resolves. Pt does not want to be discharged on prednisone (urticarial reaction).  Will provide duonebs as outpatient - Will need follow-up CXR to look for interval improvement  of atelectasis + pneumomediastinum - Finish course of azithromycin as outpatient - Has follow-up w PCP next week  2) Leukocytosis Suspect 2/2 IV steroids. Trending down. Will d/c w azithromycin.  3) Combined acid/base disturbance  Resolved.  On admission, patient with pH of 7.27 and AG acidosis w gap of 18 and delta-delta of 0.3 w PCO2 of 46, indicating concomitant respiratory acidosis. The respiratory acidosis can be explained by retention in setting of acute asthma exacerbation. She also had elevated lactic acid which explains her anion gap. Repeat metabolic panels demonstrate closed AG.  3) Hypokalemia  Patient noted to have potassium of 2.8 in ED, possibly in setting of albuterol use. Replaced orally, repeat is 4.2 for past 2 days.   4) Depression/anxiety  Patient acknowledges increasing anxiety today up from her baseline  which is not unexpected given her current condition. She does report increasing frequency of anxiety episodes at home, multiple times a week. Prefers to take home Pristiq over formulary Effexor. - Pt will bring in meds from home to take while in hospital   Dispo: Today  The patient does have a current PCP (DANIEL, TERRY, MD), therefore will not be requiring OPC follow-up after discharge.   The patient does not have transportation limitations that hinder transportation to clinic appointments.  .Services Needed at time of discharge: Y = Yes, Blank = No PT:   OT:   RN:   Equipment:   Other:     LOS: 2 days   Bronson Curb 12/11/2011, 9:59 AM

## 2011-12-11 NOTE — Progress Notes (Signed)
Have just received new orders to place patient on nasal canula. Patient has ben placed on 4 liters and is presently oxygen is 100%

## 2011-12-11 NOTE — Progress Notes (Signed)
Patient has just ambulated 330 feet on room air tolerated well. Vitals: HR=113, pulse ox was 98% on room air, and Blood pressure was 130/90.

## 2011-12-11 NOTE — Progress Notes (Signed)
Patient has called this Clinical research associate to inform us that the [facility] pharmacy that she normally uses is closed today. Have contacted resident on call and Dr. Bosie Clos who has found a pharmacy open 24 hrs. She will call her Rx. In and I am now getting hold of the patient with this information.

## 2011-12-12 LAB — CULTURE, RESPIRATORY W GRAM STAIN

## 2011-12-23 ENCOUNTER — Encounter: Payer: Self-pay | Admitting: Pulmonary Disease

## 2011-12-23 ENCOUNTER — Ambulatory Visit (INDEPENDENT_AMBULATORY_CARE_PROVIDER_SITE_OTHER): Payer: 59 | Admitting: Pulmonary Disease

## 2011-12-23 ENCOUNTER — Telehealth: Payer: Self-pay | Admitting: *Deleted

## 2011-12-23 ENCOUNTER — Other Ambulatory Visit: Payer: 59

## 2011-12-23 ENCOUNTER — Ambulatory Visit (INDEPENDENT_AMBULATORY_CARE_PROVIDER_SITE_OTHER)
Admission: RE | Admit: 2011-12-23 | Discharge: 2011-12-23 | Disposition: A | Discharge status: Home / Self Care | Payer: 59 | Source: Ambulatory Visit | Attending: Pulmonary Disease | Admitting: Pulmonary Disease

## 2011-12-23 VITALS — BP 136/84 | HR 100 | Temp 98.3°F | Ht 64.5 in | Wt 169.4 lb

## 2011-12-23 DIAGNOSIS — J982 Interstitial emphysema: Secondary | ICD-10-CM

## 2011-12-23 DIAGNOSIS — J45909 Unspecified asthma, uncomplicated: Secondary | ICD-10-CM

## 2011-12-23 DIAGNOSIS — J4540 Moderate persistent asthma, uncomplicated: Secondary | ICD-10-CM | POA: Insufficient documentation

## 2011-12-23 MED ORDER — FLUTICASONE-SALMETEROL 250-50 MCG/DOSE IN AEPB: 1.0000 | INHALATION_SPRAY | Freq: Two times a day (BID) | RESPIRATORY_TRACT | Status: DC

## 2011-12-23 MED ORDER — ALBUTEROL SULFATE HFA 108 (90 BASE) MCG/ACT IN AERS: 2.0000 | INHALATION_SPRAY | Freq: Four times a day (QID) | RESPIRATORY_TRACT | Status: DC | PRN

## 2011-12-23 NOTE — Patient Instructions (Addendum)
Will decrease your advair to the 250/50 strength.  Be sure and rinse/gargle well after using.  Take this everyday whether you think you need it or not. Do NOT use your neb machine except for emergencies. Will change your rescue inhaler to albuterol alone, 2 puffs every 6hrs if needed.  Can use current combivent until it runs out.  Keep in mind asthma is a steroid responsive disease, and inhaled corticosteroids are the mainstay of treatment, not bronchodilators like albuterol.  If you are using your rescue inhaler more than 2 times a week, I need to know. Will check cxr today to followup your air leak. Will check alpha one antitrypsin level in light of your recent pneumomediastinum. Will schedule you for breathing studies, and call you with results. Will arrange for allergy testing with Dr. Maple Hudson in our office. Would like to see you back in 8 weeks for followup Will fill out your FMLA papers once I see your breathing tests, but if you need before then, would suggest having your primary doctor do this. Ok with me for you to return to work.

## 2011-12-23 NOTE — Progress Notes (Signed)
  Subjective:    Patient ID: Nancy Barnes, female    DOB: July 14, 1970, 41 y.o.   MRN: [id number]  HPI The patient is a 41 year old female who been asked to see for management of asthma and recent pneumomediastinum.  The patient is a never smoker, and tells me that she was diagnosed with asthma over 20 years ago.  This has been very poorly controlled, but she admits that she has not taken inhaled corticosteroids on a regular basis.  She was recently in the hospital the end of November for an acute exacerbation, complicated by a pneumomediastinum on CT chest.  She did not require tube thoracostomy.  She believes this was related to a viral upper respiratory infection.  On discharge, the patient was started on Advair 500, and has been staying on this compliantly.  She feels that her breathing is stable, but she continues to have some degree of substernal chest pain.  She has not had a followup chest x-ray since discharge.  She has had hoarseness since being on the Advair, but she is on the highest strength.  The patient states that she has year round allergies with chronic postnasal drip.  She believes that she has many different food allergies, and is requesting formal allergy testing.  She also has significant reflux symptoms but takes medications as needed.   Review of Systems  Constitutional: Negative for fever and unexpected weight change.  HENT: Positive for congestion, rhinorrhea, voice change ( since beginning Advair) and postnasal drip. Negative for ear pain, nosebleeds, sore throat, sneezing, trouble swallowing, dental problem and sinus pressure.   Eyes: Negative for redness and itching.  Respiratory: Positive for cough ( productive at times), chest tightness and shortness of breath ( exertion and rest). Negative for wheezing.   Cardiovascular: Positive for chest pain and palpitations ( ireeg heart rhythm). Negative for leg swelling.  Gastrointestinal: Negative for nausea and vomiting.   Genitourinary: Negative for dysuria.  Musculoskeletal: Negative for joint swelling.  Skin: Negative for rash.  Neurological: Positive for headaches.  Hematological: Does not bruise/bleed easily.  Psychiatric/Behavioral: Positive for dysphoric mood. The patient is nervous/anxious.        Objective:   Physical Exam Constitutional:  Overweight female, no acute distress  HENT:  Nares patent without discharge  Oropharynx without exudate, palate and uvula are normal  Eyes:  Perrla, eomi, no scleral icterus  Neck:  No JVD, no TMG  Cardiovascular:  Normal rate, regular rhythm, no rubs or gallops.  No murmurs        Intact distal pulses  Pulmonary :  Normal breath sounds, no stridor or respiratory distress   No rales, rhonchi, or wheezing  Abdominal:  Soft, nondistended, bowel sounds present.  No tenderness noted.   Musculoskeletal:  No lower extremity edema noted.  Lymph Nodes:  No cervical lymphadenopathy noted  Skin:  No cyanosis noted  Neurologic:  Alert, appropriate, moves all 4 extremities without obvious deficit.         Assessment & Plan:

## 2011-12-23 NOTE — Telephone Encounter (Signed)
FMLA paperwork in folder to fill out/sign. Please return to me so that I may contact patient to pick up. Patient aware that paperwork may not be completed until the end of week. Needs to be completed by 01/05/12 at the latest.   Message sent to [facility] as FYI.

## 2011-12-23 NOTE — Discharge Summary (Signed)
I admitted Ms Nancy Barnes and supervised her care with my team until 12/10/11. She was discharged on 11/29, a day when I was not covering but the physician who was covering is not with [facility] any more. I did not see the patient on the day of discharge, but I have gone over the labs and follow ups and the discharge documentation note. I am signing this summary for administrative purposes only. Thanks.

## 2011-12-23 NOTE — Assessment & Plan Note (Signed)
The patient has had a recent pneumomediastinum by CT chest, associated with an acute exacerbation of her asthma.  Her CT does not show a lot of cystic lung disease, but I have recommended that we check an alpha-1 antitrypsin level for completeness.  Will also do a followup chest x-ray today.

## 2011-12-23 NOTE — Telephone Encounter (Signed)
Discussed with her.  Cannot fill out until evaluation complete.  If she feels she needs before then, would have her primary md fill out.

## 2011-12-23 NOTE — Assessment & Plan Note (Signed)
The patient has a history of asthma for 20 years, but apparently has never had pulmonary function studies.  She is getting over a recent acute exacerbation, complicated by pneumomediastinum.  She has never taken inhaled corticosteroids on a regular basis, and I have stressed to her the importance of doing this.  She is really caught up and using a rescue inhaler frequently, and I have had a long discussion with her about asthma being an inflammatory airways disease.  I have told her that she stays on her controller medicines on a consistent basis, more than likely she will rarely need her rescue inhaler.  She is currently on the highest strength of Advair, and is having a lot of hoarseness and upper airway cough.  I would like to change her to the 250/50 strength, and see how she does.  Will also need to keep in mind that the role of allergic disease as well as reflux.  She is requesting an allergy evaluation, and I will make the referral.  We'll also schedule her for full pulmonary function studies.

## 2011-12-23 NOTE — Addendum Note (Signed)
Addended by: Nita Sells on: 12/23/2011 12:16 PM   Modules accepted: Orders

## 2011-12-31 LAB — ALPHA-1 ANTITRYPSIN PHENOTYPE: A-1 Antitrypsin: 131 mg/dL (ref 83–199)

## 2012-01-01 ENCOUNTER — Telehealth: Payer: Self-pay | Admitting: Pulmonary Disease

## 2012-01-01 NOTE — Telephone Encounter (Signed)
Error. Another encounter is already opened. Hazel Sams

## 2012-01-01 NOTE — Telephone Encounter (Signed)
Spoke with pt and notified of recs per [facility] She verbalized understanding Will come and pick up the forms tomorrow and have her PCP fill out Pt states nothing further needed

## 2012-01-01 NOTE — Telephone Encounter (Signed)
Pt states she is returning a call from [employer]. Call pt at work- [phone]. Nancy Barnes

## 2012-01-26 LAB — PULMONARY FUNCTION TEST

## 2012-02-03 ENCOUNTER — Encounter: Payer: Self-pay | Admitting: Pulmonary Disease

## 2012-02-03 ENCOUNTER — Ambulatory Visit (INDEPENDENT_AMBULATORY_CARE_PROVIDER_SITE_OTHER): Payer: 59 | Admitting: Pulmonary Disease

## 2012-02-03 VITALS — BP 142/94 | HR 75 | Temp 98.7°F | Ht 64.5 in | Wt 173.0 lb

## 2012-02-03 DIAGNOSIS — J45909 Unspecified asthma, uncomplicated: Secondary | ICD-10-CM

## 2012-02-03 MED ORDER — FLUTICASONE-SALMETEROL 250-50 MCG/DOSE IN AEPB: 1.0000 | INHALATION_SPRAY | Freq: Two times a day (BID) | RESPIRATORY_TRACT | Status: DC

## 2012-02-03 NOTE — Patient Instructions (Addendum)
Stay on advair 250/50 one inhalation am and pm.  Keep mouth rinsed well. Switch your rescue inhaler over to albuterol once your combivent runs out.  Keep your apptm for allergy evaluation and upcoming pfts. followup with me in 3mos., and will call you with the breathing test results.

## 2012-02-03 NOTE — Assessment & Plan Note (Signed)
The patient has seen a difference in her breathing since being on the Advair on a consistent basis.  However, she is concerned that she still had some degree of dyspnea on exertion if she tried to run or do heavier exertional activities.  I have reminded her that she needs to give this a little more time until her airway inflammation is resolved.  She also needs to work on weight loss and conditioning.

## 2012-02-03 NOTE — Progress Notes (Signed)
  Subjective:    Patient ID: Nancy Barnes, female    DOB: 1971-01-10, 42 y.o.   MRN: [id number]  HPI The patient comes in today for followup of her history of asthma.  She is maintained on her Advair, but ran out approximately 4 days ago.  She has seen a decline in her breathing since that time.  She has not had any further acute exacerbation, has had to use her rescue inhaler a few times when she was heavily exerting herself.  Her recent chest x-ray showed no acute process, and her alpha-1 antitrypsin level was normal.  She is scheduled for upcoming pulmonary function studies.   Review of Systems  Constitutional: Negative for fever and unexpected weight change.  HENT: Negative for ear pain, nosebleeds, congestion, sore throat, rhinorrhea, sneezing, trouble swallowing, dental problem, postnasal drip and sinus pressure.   Eyes: Negative for redness and itching.  Respiratory: Positive for cough ( recent chest cold) and shortness of breath. Negative for chest tightness and wheezing.   Cardiovascular: Negative for palpitations and leg swelling.  Gastrointestinal: Negative for nausea and vomiting.  Genitourinary: Negative for dysuria.  Musculoskeletal: Negative for joint swelling.  Skin: Negative for rash.  Neurological: Negative for headaches.  Hematological: Does not bruise/bleed easily.  Psychiatric/Behavioral: Negative for dysphoric mood. The patient is not nervous/anxious.        Objective:   Physical Exam Overweight female in no acute distress Nose without purulence or discharge noted Neck without lymphadenopathy or thyromegaly Chest totally clear to auscultation, no wheezing Cardiac exam with regular rate and rhythm Lower extremities without edema, no cyanosis Alert and oriented, moves all 4 extremities.       Assessment & Plan:

## 2012-02-13 ENCOUNTER — Ambulatory Visit (INDEPENDENT_AMBULATORY_CARE_PROVIDER_SITE_OTHER): Payer: 59 | Admitting: Pulmonary Disease

## 2012-02-13 DIAGNOSIS — J45909 Unspecified asthma, uncomplicated: Secondary | ICD-10-CM

## 2012-02-13 NOTE — Progress Notes (Signed)
PFT done today. 

## 2012-02-19 ENCOUNTER — Telehealth: Payer: Self-pay | Admitting: Pulmonary Disease

## 2012-02-19 NOTE — Telephone Encounter (Signed)
Please let pt know that her breathing tests are totally normal.

## 2012-02-20 NOTE — Telephone Encounter (Signed)
lmomtcb x1---detailed---office closes at 530 will reopen Monday morning at 8am.

## 2012-02-24 ENCOUNTER — Ambulatory Visit: Payer: 59

## 2012-02-24 ENCOUNTER — Ambulatory Visit (INDEPENDENT_AMBULATORY_CARE_PROVIDER_SITE_OTHER): Payer: 59 | Admitting: Internal Medicine

## 2012-02-24 ENCOUNTER — Encounter: Payer: Self-pay | Admitting: Internal Medicine

## 2012-02-24 VITALS — BP 124/70 | HR 80 | Ht 64.5 in | Wt 175.4 lb

## 2012-02-24 DIAGNOSIS — L500 Allergic urticaria: Secondary | ICD-10-CM

## 2012-02-24 DIAGNOSIS — J302 Other seasonal allergic rhinitis: Secondary | ICD-10-CM

## 2012-02-24 DIAGNOSIS — J45909 Unspecified asthma, uncomplicated: Secondary | ICD-10-CM

## 2012-02-24 DIAGNOSIS — Z91018 Allergy to other foods: Secondary | ICD-10-CM

## 2012-02-24 DIAGNOSIS — J309 Allergic rhinitis, unspecified: Secondary | ICD-10-CM

## 2012-02-24 DIAGNOSIS — T781XXA Other adverse food reactions, not elsewhere classified, initial encounter: Secondary | ICD-10-CM

## 2012-02-24 DIAGNOSIS — Z888 Allergy status to other drugs, medicaments and biological substances status: Secondary | ICD-10-CM

## 2012-02-24 DIAGNOSIS — Z789 Other specified health status: Secondary | ICD-10-CM

## 2012-02-24 NOTE — Telephone Encounter (Signed)
Pt aware of results of PFT per [facility]. Nothing further needed.

## 2012-02-24 NOTE — Patient Instructions (Addendum)
Order- lab Allergy profile                      Dx Allergic asthma, allergic rhinitis, food allergy, urticaria                  Food Allergy Profile                  Seafood Allergy profile- 4657                  Penicilloyl G- [id number]                  Penicilloyl V- [id number]                  Aspirin IgE [phone]
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
## 2012-02-25 LAB — ALLERGY FULL PROFILE
Allergen,Goose feathers, e70: <0.1 kU/L
Bermuda Grass: <0.1 kU/L
Box Elder IgE: <0.1 kU/L
Candida Albicans: 0.2 kU/L — ABNORMAL HIGH
Common Ragweed: <0.1 kU/L
Dog Dander: <0.1 kU/L
Fescue: <0.1 kU/L
G005 Rye, Perennial: <0.1 kU/L
Goldenrod: <0.1 kU/L
Helminthosporium halodes: <0.1 kU/L
House Dust Hollister: <0.1 kU/L
IgE (Immunoglobulin E), Serum: 43.2 IU/mL (ref 0.0–180.0)
Oak: <0.1 kU/L
Stemphylium Botryosum: <0.1 kU/L
Timothy Grass: <0.1 kU/L

## 2012-02-25 LAB — ALLERGEN SEAFOOD PANEL
Clams: <0.1 kU/L
Crab: <0.1 kU/L
Scallop IgE: <0.1 kU/L
Tuna IgE: <0.1 kU/L

## 2012-02-28 LAB — ALLERGEN PENICILLIN V (MINOR): Allergen Penicillin V (Minor): <0.1 kU/L

## 2012-02-29 ENCOUNTER — Encounter: Payer: Self-pay | Admitting: Internal Medicine

## 2012-02-29 DIAGNOSIS — Z789 Other specified health status: Secondary | ICD-10-CM | POA: Insufficient documentation

## 2012-02-29 DIAGNOSIS — Z91018 Allergy to other foods: Secondary | ICD-10-CM | POA: Insufficient documentation

## 2012-02-29 DIAGNOSIS — J302 Other seasonal allergic rhinitis: Secondary | ICD-10-CM | POA: Insufficient documentation

## 2012-02-29 NOTE — Assessment & Plan Note (Signed)
Plan-allergy profile 

## 2012-02-29 NOTE — Assessment & Plan Note (Signed)
Not sure all of these are the same issue, or that allergy is the problem. She does note to avoid aspirin products. Plan-allergy antibody assessment for penicillin and aspirin

## 2012-02-29 NOTE — Assessment & Plan Note (Signed)
She understands to avoid any foods that trigger symptoms. Be quit use antihistamine. Plan-food allergy profile and seafood profile. Consider EpiPen.

## 2012-03-03 LAB — IGG FOOD PANEL
Allergen, Milk, IgG: >30 ug/mL — ABNORMAL HIGH (ref <0.15)
Beef, IgG: 27.6 ug/mL — ABNORMAL HIGH (ref <2.0)
Corn, IgG: <0.15 ug/mL (ref <0.15)
Egg white, IgG: 11.4 ug/mL — ABNORMAL HIGH (ref <2.0)
Peanut, IgG: <0.15 ug/mL (ref <0.15)

## 2012-03-11 ENCOUNTER — Telehealth: Payer: Self-pay | Admitting: Internal Medicine

## 2012-03-11 NOTE — Telephone Encounter (Signed)
Notes Recorded by Ronny Bacon, CMA on 03/11/2012 at 9:41 AM LMTCB ------  Notes Recorded by Waymon Budge, MD on 03/05/2012 at 8:05 PM Allergy antibodies elevated especially to house dust. Some food antibody elevations- will discuss at next ov. No antibodies against penicillin, making penicillin allergy unlikely. Pt advised. Carron Curie, CMA

## 2012-03-11 NOTE — Progress Notes (Signed)
Quick Note:  LMTCB ______ 

## 2012-03-15 ENCOUNTER — Telehealth: Payer: Self-pay | Admitting: Internal Medicine

## 2012-03-15 NOTE — Telephone Encounter (Signed)
Pt returned triage's call.  Nancy Barnes ° °

## 2012-03-15 NOTE — Telephone Encounter (Signed)
lmomtcb x1 for pt 

## 2012-03-15 NOTE — Telephone Encounter (Signed)
Notes Recorded by Waymon Budge, MD on 03/05/2012 at 8:05 PM Allergy antibodies elevated especially to house dust. Some food antibody elevations- will discuss at next ov. No antibodies against penicillin, making penicillin allergy unlikely.  -----  Called, spoke with pt.  She has a few questions about the allergy test results: 1.  As beef was elevated at 27.6 and egg yolk at 7.2, she would like to know if this is considered an allergy or a sensitivity? 2.  She would like to know if she should d/c these food from her diet all together?  And, if she should do this at once. 3.  Pt states she was tested for peanut allergy but not tree nuts.  She would like to know if this needs to be done. 4.  Pt was asked to f/u with CDY in 1 month from OV with him on 02/24/12.  Somehow, this was scheduled with [facility] on March 11 and her pending OV with [facility] in April was cancelled.  When she was last seen by [facility] in Jan, he wanted to see her back in 3 months.  We have cancelled the appt with [facility] on March 11 and rescheduled this to the orginial appt on April 21 at 1:45 pm with [facility]  However, she still needs a 1 month OV with CDY.  She would like to know if she can be worked in with him on March 11 around 1:45 as she has already requested this day from work and this was the day our office had given her.  Katie/Dr. Maple Hudson, there are no openings around this time. Pls advise if pt can be worked in.  Thank you.

## 2012-03-16 NOTE — Telephone Encounter (Signed)
lmomtcb to schedule this appt.

## 2012-03-16 NOTE — Telephone Encounter (Signed)
Unfortunately we are unable to schedule patient for March 11,2014 at 1:45pm as CY is booked. We can offer Wednesday 03-24-12 at 11:15am with CY and she and CY can discuss labs in greater detail then.

## 2012-03-17 NOTE — Telephone Encounter (Signed)
Called and spoke with pt and she is aware of appt with CY on 3/12 at 11:15.  She is aware that they will discuss her allergy test in greater detail. Nothing further is needed.

## 2012-03-17 NOTE — Telephone Encounter (Signed)
Returning call can be reached at [phone].Raylene Everts

## 2012-03-23 ENCOUNTER — Ambulatory Visit: Payer: 59 | Admitting: Pulmonary Disease

## 2012-03-24 ENCOUNTER — Ambulatory Visit (INDEPENDENT_AMBULATORY_CARE_PROVIDER_SITE_OTHER): Payer: 59 | Admitting: Internal Medicine

## 2012-03-24 ENCOUNTER — Encounter: Payer: Self-pay | Admitting: Internal Medicine

## 2012-03-24 ENCOUNTER — Other Ambulatory Visit: Payer: 59

## 2012-03-24 DIAGNOSIS — J309 Allergic rhinitis, unspecified: Secondary | ICD-10-CM

## 2012-03-24 DIAGNOSIS — J302 Other seasonal allergic rhinitis: Secondary | ICD-10-CM

## 2012-03-24 MED ORDER — AZELASTINE-FLUTICASONE 137-50 MCG/ACT NA SUSP: 2.0000 | Freq: Every day | NASAL | Status: DC

## 2012-03-24 NOTE — Patient Instructions (Addendum)
Order- lab- Food IgE profile                    Nut allergy panel  Sample Dymista nasal spray     1-2 puffs each nostril once daily at bedtime        You can use it twice daily if needed  Consider keeping a food diary to track symptoms with what you ate each day   Consider the environmental dust and pollen precautions we discussed     Especially dust mite encasings and maybe a HEPA air cleaner for bedroom  Schedule allergy skin testing   Antihistamines directly block the skin tests, so please, for 3 days before skin testing, no Zyrtec, Dymista or other antihistamines including otc cough and cold meds, otc sleep meds. Check with [place] if any questions.
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
## 2012-03-25 LAB — ALLERGEN FOOD PROFILE SPECIFIC IGE: IgE (Immunoglobulin E), Serum: 37.8 IU/mL (ref 0.0–180.0)

## 2012-03-27 NOTE — Assessment & Plan Note (Signed)
Plan-food diary. Avoid foods that definitely cause symptoms. Food IgE profile.

## 2012-03-27 NOTE — Assessment & Plan Note (Addendum)
Probable seasonal allergic rhinitis now related to tree pollens. Cannot exclude a mild viral upper respiratory infection. Plan-sample Dymista nasal spray. Use Sudafed if needed. Return off of antihistamines for allergy skin testing.

## 2012-04-15 ENCOUNTER — Telehealth: Payer: Self-pay | Admitting: Internal Medicine

## 2012-04-15 NOTE — Telephone Encounter (Signed)
Pt is aware that her labs are back but CY has to take a look at them.  She verbalized understanding.  CY - please take a look at her labs. Thanks.

## 2012-04-15 NOTE — Telephone Encounter (Signed)
Per CY-allergy antibodies elevated only for shrimp, not for other foods or any of the nuts tested. Avoid foods that cause problems. I had to leave a message for patient to call back and speak with Triage nurse as I am off on Friday 04-16-12.

## 2012-04-15 NOTE — Telephone Encounter (Signed)
I called and got results faxed from [facility] and message on your cart to advise. Thanks.

## 2012-04-16 NOTE — Telephone Encounter (Signed)
Spoke with patient informed her of results as listed below per CY. Patient verbalized understanding Nothing further needed

## 2012-04-16 NOTE — Telephone Encounter (Signed)
Pt returned call and can be reached @ [phone]. Nancy Barnes

## 2012-05-03 ENCOUNTER — Encounter: Payer: Self-pay | Admitting: Pulmonary Disease

## 2012-05-03 ENCOUNTER — Ambulatory Visit (INDEPENDENT_AMBULATORY_CARE_PROVIDER_SITE_OTHER): Payer: 59 | Admitting: Pulmonary Disease

## 2012-05-03 ENCOUNTER — Ambulatory Visit: Payer: 59 | Admitting: Pulmonary Disease

## 2012-05-03 VITALS — BP 120/86 | HR 76 | Temp 98.3°F | Ht 64.5 in | Wt 176.2 lb

## 2012-05-03 DIAGNOSIS — J45909 Unspecified asthma, uncomplicated: Secondary | ICD-10-CM

## 2012-05-03 NOTE — Assessment & Plan Note (Signed)
The patient appears to be doing well from a pulmonary standpoint, and is having no issues with her medications.  I have asked her to continue on her Advair, and to let us know if she has increased rescue inhaler needed.  She is to followup with [place] in one year, or sooner if having issues.

## 2012-05-03 NOTE — Progress Notes (Signed)
  Subjective:    Patient ID: Nancy Barnes, female    DOB: [birth year], 42 y.o.   MRN: [id number]  HPI The patient comes in today for followup of her asthma.  She has done very well overall on her current regimen, and has not had to overuse her albuterol rescue.  She is having some issues with the current allergy season, but is being followed closely by Dr. Maple Hudson for these.  She denies any significant cough, mucus production, or worsening shortness of breath.   Review of Systems  Constitutional: Negative for fever and unexpected weight change.  HENT: Positive for postnasal drip. Negative for ear pain, nosebleeds, congestion, sore throat, rhinorrhea, sneezing, trouble swallowing, dental problem and sinus pressure.   Eyes: Negative for redness and itching.  Respiratory: Negative for cough, chest tightness, shortness of breath and wheezing.   Cardiovascular: Positive for palpitations. Negative for leg swelling.  Gastrointestinal: Negative for nausea and vomiting.  Genitourinary: Negative for dysuria.  Musculoskeletal: Negative for joint swelling.  Skin: Negative for rash.  Neurological: Positive for headaches.  Hematological: Does not bruise/bleed easily.  Psychiatric/Behavioral: Positive for dysphoric mood. The patient is nervous/anxious.        Objective:   Physical Exam Wd female in nad Nose without purulence or discharge noted. Neck without LN or TMG Chest with clear bs, no wheezing. Cor with rrr LE without edema, no cyanosis Alert and oriented, moves all 4.        Assessment & Plan:

## 2012-05-03 NOTE — Patient Instructions (Addendum)
Stay on current asthma medications followup with me in one year if doing well, but call if having issues or increased albuterol use.

## 2012-05-19 ENCOUNTER — Encounter: Payer: Self-pay | Admitting: Internal Medicine

## 2012-05-19 ENCOUNTER — Ambulatory Visit (INDEPENDENT_AMBULATORY_CARE_PROVIDER_SITE_OTHER): Payer: 59 | Admitting: Internal Medicine

## 2012-05-19 VITALS — BP 122/90 | HR 90 | Ht 64.5 in | Wt 182.2 lb

## 2012-05-19 DIAGNOSIS — J302 Other seasonal allergic rhinitis: Secondary | ICD-10-CM

## 2012-05-19 DIAGNOSIS — J45909 Unspecified asthma, uncomplicated: Secondary | ICD-10-CM

## 2012-05-19 DIAGNOSIS — J309 Allergic rhinitis, unspecified: Secondary | ICD-10-CM

## 2012-05-19 DIAGNOSIS — J45901 Unspecified asthma with (acute) exacerbation: Secondary | ICD-10-CM

## 2012-05-19 DIAGNOSIS — Z5189 Encounter for other specified aftercare: Secondary | ICD-10-CM

## 2012-05-19 MED ORDER — MONTELUKAST SODIUM 10 MG PO TABS: 10.0000 mg | ORAL_TABLET | Freq: Every day | ORAL | Status: DC

## 2012-05-19 MED ORDER — AZELASTINE-FLUTICASONE 137-50 MCG/ACT NA SUSP: 1.0000 | Freq: Every day | NASAL | Status: DC

## 2012-05-19 NOTE — Patient Instructions (Addendum)
Scripts sent for Singulair and Dymista  You can try Allegra/ fexofenadine as an alternative antihistamine  Your skin test results do qualify you for other approaches, like allergy vaccine, if needed.  Please call as needed
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
## 2012-05-27 ENCOUNTER — Encounter: Payer: Self-pay | Admitting: Internal Medicine

## 2012-05-27 NOTE — Assessment & Plan Note (Signed)
There is an atopic component but asthma is adequately controlled at this time. 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

## 2012-05-27 NOTE — Assessment & Plan Note (Signed)
Avoid troublesome foods

## 2012-05-27 NOTE — Assessment & Plan Note (Signed)
Basic environmental precautions reviewed. Allergy vaccine could be an option if symptomatic therapy is insufficient. Plan-Singulair, Dymista, Allegra

## 2012-06-04 ENCOUNTER — Encounter: Payer: Self-pay | Admitting: Internal Medicine

## 2012-07-09 ENCOUNTER — Encounter: Payer: Self-pay | Admitting: Internal Medicine

## 2012-07-09 ENCOUNTER — Ambulatory Visit (INDEPENDENT_AMBULATORY_CARE_PROVIDER_SITE_OTHER): Payer: 59 | Admitting: Internal Medicine

## 2012-07-09 VITALS — BP 138/90 | HR 76 | Ht 64.5 in | Wt 179.6 lb

## 2012-07-09 DIAGNOSIS — J4520 Mild intermittent asthma, uncomplicated: Secondary | ICD-10-CM

## 2012-07-09 DIAGNOSIS — J309 Allergic rhinitis, unspecified: Secondary | ICD-10-CM

## 2012-07-09 DIAGNOSIS — J45909 Unspecified asthma, uncomplicated: Secondary | ICD-10-CM

## 2012-07-09 DIAGNOSIS — J302 Other seasonal allergic rhinitis: Secondary | ICD-10-CM

## 2012-07-09 MED ORDER — FLUTICASONE PROPIONATE HFA 110 MCG/ACT IN AERO: 1.0000 | INHALATION_SPRAY | Freq: Two times a day (BID) | RESPIRATORY_TRACT | Status: DC
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
## 2012-07-09 NOTE — Patient Instructions (Addendum)
Try sample Flovent 110 maintenance steroid inhaler   2 puffs then rinse mouth, twice daily     Compare this to Advair 250

## 2012-07-25 ENCOUNTER — Encounter: Payer: Self-pay | Admitting: Internal Medicine

## 2012-07-25 NOTE — Assessment & Plan Note (Addendum)
Instead of Advair 100, she will try Flovent 110 if she can get it cheaper, as discussed. Unless she wishes the allergy interventions I can offer, she will be well served by Dr Shelle Iron and will not need to continue following with me.

## 2012-07-25 NOTE — Assessment & Plan Note (Signed)
She will make do with antihistamines.

## 2012-10-15 ENCOUNTER — Encounter: Payer: Self-pay | Admitting: Internal Medicine

## 2012-10-15 ENCOUNTER — Ambulatory Visit (INDEPENDENT_AMBULATORY_CARE_PROVIDER_SITE_OTHER): Payer: 59 | Admitting: Internal Medicine

## 2012-10-15 VITALS — BP 120/76 | HR 65 | Ht 64.5 in | Wt 181.0 lb

## 2012-10-15 DIAGNOSIS — J309 Allergic rhinitis, unspecified: Secondary | ICD-10-CM

## 2012-10-15 DIAGNOSIS — J302 Other seasonal allergic rhinitis: Secondary | ICD-10-CM

## 2012-10-15 DIAGNOSIS — Z23 Encounter for immunization: Secondary | ICD-10-CM

## 2012-10-15 DIAGNOSIS — J45909 Unspecified asthma, uncomplicated: Secondary | ICD-10-CM

## 2012-10-15 MED ORDER — FLUTICASONE-SALMETEROL 250-50 MCG/DOSE IN AEPB: INHALATION_SPRAY | RESPIRATORY_TRACT | Status: DC

## 2012-10-15 MED ORDER — FLUTICASONE-SALMETEROL 250-50 MCG/DOSE IN AEPB: 1.0000 | INHALATION_SPRAY | Freq: Two times a day (BID) | RESPIRATORY_TRACT | Status: DC

## 2012-10-15 MED ORDER — AZELASTINE-FLUTICASONE 137-50 MCG/ACT NA SUSP: 2.0000 | Freq: Every day | NASAL | Status: DC
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
## 2012-10-15 NOTE — Patient Instructions (Addendum)
Sample and script Advair 250     1 puff then rinse mouth twice daily  Sample Dymista nasal spray    1-2 puffs each nostril once daily at bedtime  Flu vax

## 2012-10-21 ENCOUNTER — Emergency Department (HOSPITAL_COMMUNITY): Payer: Worker's Compensation

## 2012-10-21 ENCOUNTER — Encounter (HOSPITAL_COMMUNITY): Payer: Self-pay | Admitting: Emergency Medicine

## 2012-10-21 ENCOUNTER — Emergency Department (HOSPITAL_COMMUNITY)
Admission: EM | Admit: 2012-10-21 | Discharge: 2012-10-21 | Disposition: A | Discharge status: Home / Self Care | Payer: Worker's Compensation | Attending: Emergency Medicine | Admitting: Emergency Medicine

## 2012-10-21 DIAGNOSIS — Z862 Personal history of diseases of the blood and blood-forming organs and certain disorders involving the immune mechanism: Secondary | ICD-10-CM | POA: Insufficient documentation

## 2012-10-21 DIAGNOSIS — J705 Respiratory conditions due to smoke inhalation: Secondary | ICD-10-CM | POA: Insufficient documentation

## 2012-10-21 DIAGNOSIS — F329 Major depressive disorder, single episode, unspecified: Secondary | ICD-10-CM | POA: Insufficient documentation

## 2012-10-21 DIAGNOSIS — Z880 Allergy status to penicillin: Secondary | ICD-10-CM | POA: Insufficient documentation

## 2012-10-21 DIAGNOSIS — R002 Palpitations: Secondary | ICD-10-CM | POA: Insufficient documentation

## 2012-10-21 DIAGNOSIS — Z79899 Other long term (current) drug therapy: Secondary | ICD-10-CM | POA: Insufficient documentation

## 2012-10-21 DIAGNOSIS — R42 Dizziness and giddiness: Secondary | ICD-10-CM | POA: Insufficient documentation

## 2012-10-21 DIAGNOSIS — Z8639 Personal history of other endocrine, nutritional and metabolic disease: Secondary | ICD-10-CM | POA: Insufficient documentation

## 2012-10-21 DIAGNOSIS — F411 Generalized anxiety disorder: Secondary | ICD-10-CM | POA: Insufficient documentation

## 2012-10-21 DIAGNOSIS — F3289 Other specified depressive episodes: Secondary | ICD-10-CM | POA: Insufficient documentation

## 2012-10-21 DIAGNOSIS — J45901 Unspecified asthma with (acute) exacerbation: Secondary | ICD-10-CM | POA: Insufficient documentation

## 2012-10-21 DIAGNOSIS — R110 Nausea: Secondary | ICD-10-CM | POA: Insufficient documentation

## 2012-10-21 HISTORY — DX: Interstitial emphysema: J98.2

## 2012-10-21 LAB — CARBOXYHEMOGLOBIN
Total hemoglobin: 14.1 g/dL (ref 12.0–16.0)
Total oxygen content: 19.2 mL/dL (ref 15.0–23.0)

## 2012-10-21 NOTE — ED Notes (Signed)
Patient with no complaints at this time. Respirations even and unlabored. Skin warm/dry. Discharge instructions reviewed with patient at this time. Patient given opportunity to voice concerns/ask questions. IV removed per policy and band-aid applied to site. Patient discharged at this time and left Emergency Department with steady gait.  
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
## 2012-10-22 ENCOUNTER — Telehealth: Payer: Self-pay | Admitting: Pulmonary Disease
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
## 2012-10-22 NOTE — Telephone Encounter (Signed)
Called, spoke with pt.  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  Pt would like to pick up letter on Monday, if possible.  Dr. Shelle Iron, pls advise if this is ok.  Thank you.

## 2012-10-22 NOTE — Telephone Encounter (Signed)
[email or web address]  

## 2012-10-25 ENCOUNTER — Encounter: Payer: Self-pay | Admitting: Internal Medicine

## 2012-10-25 NOTE — Assessment & Plan Note (Signed)
Sample Dymista nasal spray for trial

## 2012-10-25 NOTE — Telephone Encounter (Signed)
LMTCB

## 2012-10-25 NOTE — Telephone Encounter (Signed)
Done.  Please copy and have this scanned into pts chart.

## 2012-10-25 NOTE — Telephone Encounter (Signed)
Patient returning call.

## 2012-10-25 NOTE — Assessment & Plan Note (Signed)
Plan-she asks medication change so I have switched her to Advair 250 as discussed. Flu vaccine

## 2012-10-25 NOTE — Telephone Encounter (Signed)
I spoke with pt. I made her aware. She stated she would like to pick this letter up once done. Please advise KC thanks

## 2012-10-25 NOTE — Telephone Encounter (Signed)
Pt aware that letter has been completed and at front for pick up. Also,copy made to have scanned in EPIC. Will sign off on message.

## 2012-11-13 ENCOUNTER — Emergency Department (HOSPITAL_COMMUNITY)
Admission: EM | Admit: 2012-11-13 | Discharge: 2012-11-13 | Disposition: A | Discharge status: Home / Self Care | Payer: 59 | Attending: Emergency Medicine | Admitting: Emergency Medicine

## 2012-11-13 ENCOUNTER — Encounter (HOSPITAL_COMMUNITY): Payer: Self-pay | Admitting: Emergency Medicine

## 2012-11-13 DIAGNOSIS — Z79899 Other long term (current) drug therapy: Secondary | ICD-10-CM | POA: Insufficient documentation

## 2012-11-13 DIAGNOSIS — Z8639 Personal history of other endocrine, nutritional and metabolic disease: Secondary | ICD-10-CM | POA: Insufficient documentation

## 2012-11-13 DIAGNOSIS — Z862 Personal history of diseases of the blood and blood-forming organs and certain disorders involving the immune mechanism: Secondary | ICD-10-CM | POA: Insufficient documentation

## 2012-11-13 DIAGNOSIS — J45901 Unspecified asthma with (acute) exacerbation: Secondary | ICD-10-CM | POA: Insufficient documentation

## 2012-11-13 DIAGNOSIS — F3289 Other specified depressive episodes: Secondary | ICD-10-CM | POA: Insufficient documentation

## 2012-11-13 DIAGNOSIS — F329 Major depressive disorder, single episode, unspecified: Secondary | ICD-10-CM | POA: Insufficient documentation

## 2012-11-13 DIAGNOSIS — Z880 Allergy status to penicillin: Secondary | ICD-10-CM | POA: Insufficient documentation

## 2012-11-13 DIAGNOSIS — R209 Unspecified disturbances of skin sensation: Secondary | ICD-10-CM | POA: Insufficient documentation

## 2012-11-13 DIAGNOSIS — IMO0002 Reserved for concepts with insufficient information to code with codable children: Secondary | ICD-10-CM | POA: Insufficient documentation

## 2012-11-13 DIAGNOSIS — F410 Panic disorder [episodic paroxysmal anxiety] without agoraphobia: Secondary | ICD-10-CM | POA: Insufficient documentation

## 2012-11-13 MED ORDER — DIAZEPAM 5 MG PO TABS: 5.0000 mg | ORAL_TABLET | Freq: Three times a day (TID) | ORAL | Status: DC

## 2012-11-13 MED ORDER — DIAZEPAM 5 MG PO TABS
5.0000 mg | ORAL_TABLET | Freq: Once | ORAL | Status: AC
  Administered 2012-11-13: 5 mg via ORAL
  Filled 2012-11-13: qty 1

## 2012-11-13 NOTE — ED Provider Notes (Signed)
Medical screening examination/treatment/procedure(s) were performed by non-physician practitioner and as supervising physician I was immediately available for consultation/collaboration.  EKG Interpretation   None      Devoria Albe, MD, Armando Gang   Ward Givens, MD 11/13/12 2241
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
## 2012-11-13 NOTE — ED Notes (Signed)
Patient given discharge instruction, verbalized understand. Patient ambulatory out of the department.  

## 2013-01-13 ENCOUNTER — Other Ambulatory Visit: Payer: Self-pay | Admitting: Pulmonary Disease

## 2013-03-19 ENCOUNTER — Emergency Department (HOSPITAL_COMMUNITY)
Admission: EM | Admit: 2013-03-19 | Discharge: 2013-03-19 | Disposition: A | Discharge status: Other Acute Inpatient Hospital | Payer: 59 | Attending: Emergency Medicine | Admitting: Emergency Medicine

## 2013-03-19 ENCOUNTER — Inpatient Hospital Stay (HOSPITAL_COMMUNITY)
Admission: AD | Admit: 2013-03-19 | Discharge: 2013-03-23 | DRG: 885 | Disposition: A | Discharge status: Home / Self Care | Payer: 59 | Source: Intra-hospital | Attending: Psychiatry | Admitting: Psychiatry

## 2013-03-19 ENCOUNTER — Encounter (HOSPITAL_COMMUNITY): Payer: Self-pay | Admitting: Emergency Medicine

## 2013-03-19 ENCOUNTER — Encounter (HOSPITAL_COMMUNITY): Payer: Self-pay

## 2013-03-19 DIAGNOSIS — F411 Generalized anxiety disorder: Secondary | ICD-10-CM | POA: Insufficient documentation

## 2013-03-19 DIAGNOSIS — Z823 Family history of stroke: Secondary | ICD-10-CM

## 2013-03-19 DIAGNOSIS — F419 Anxiety disorder, unspecified: Secondary | ICD-10-CM

## 2013-03-19 DIAGNOSIS — F329 Major depressive disorder, single episode, unspecified: Secondary | ICD-10-CM

## 2013-03-19 DIAGNOSIS — IMO0002 Reserved for concepts with insufficient information to code with codable children: Secondary | ICD-10-CM | POA: Insufficient documentation

## 2013-03-19 DIAGNOSIS — G4700 Insomnia, unspecified: Secondary | ICD-10-CM | POA: Insufficient documentation

## 2013-03-19 DIAGNOSIS — Z880 Allergy status to penicillin: Secondary | ICD-10-CM | POA: Insufficient documentation

## 2013-03-19 DIAGNOSIS — Z8709 Personal history of other diseases of the respiratory system: Secondary | ICD-10-CM | POA: Insufficient documentation

## 2013-03-19 DIAGNOSIS — J45909 Unspecified asthma, uncomplicated: Secondary | ICD-10-CM | POA: Diagnosis present

## 2013-03-19 DIAGNOSIS — R079 Chest pain, unspecified: Secondary | ICD-10-CM | POA: Insufficient documentation

## 2013-03-19 DIAGNOSIS — Z8249 Family history of ischemic heart disease and other diseases of the circulatory system: Secondary | ICD-10-CM

## 2013-03-19 DIAGNOSIS — Z79899 Other long term (current) drug therapy: Secondary | ICD-10-CM | POA: Insufficient documentation

## 2013-03-19 DIAGNOSIS — Z8639 Personal history of other endocrine, nutritional and metabolic disease: Secondary | ICD-10-CM | POA: Insufficient documentation

## 2013-03-19 DIAGNOSIS — F32A Depression, unspecified: Secondary | ICD-10-CM

## 2013-03-19 DIAGNOSIS — Z3202 Encounter for pregnancy test, result negative: Secondary | ICD-10-CM | POA: Insufficient documentation

## 2013-03-19 DIAGNOSIS — Z833 Family history of diabetes mellitus: Secondary | ICD-10-CM

## 2013-03-19 DIAGNOSIS — F332 Major depressive disorder, recurrent severe without psychotic features: Principal | ICD-10-CM | POA: Diagnosis present

## 2013-03-19 DIAGNOSIS — F410 Panic disorder [episodic paroxysmal anxiety] without agoraphobia: Secondary | ICD-10-CM | POA: Diagnosis present

## 2013-03-19 DIAGNOSIS — Z862 Personal history of diseases of the blood and blood-forming organs and certain disorders involving the immune mechanism: Secondary | ICD-10-CM | POA: Insufficient documentation

## 2013-03-19 DIAGNOSIS — F319 Bipolar disorder, unspecified: Secondary | ICD-10-CM | POA: Insufficient documentation

## 2013-03-19 LAB — RAPID URINE DRUG SCREEN, HOSP PERFORMED
Amphetamines: NOT DETECTED
BARBITURATES: NOT DETECTED
Benzodiazepines: NOT DETECTED
Cocaine: NOT DETECTED
Opiates: NOT DETECTED
TETRAHYDROCANNABINOL: NOT DETECTED

## 2013-03-19 LAB — CBC WITH DIFFERENTIAL/PLATELET
BASOS ABS: 0 10*3/uL (ref 0.0–0.1)
Basophils Relative: 0 % (ref 0–1)
Eosinophils Absolute: 0.1 10*3/uL (ref 0.0–0.7)
Eosinophils Relative: 1 % (ref 0–5)
HEMATOCRIT: 40 % (ref 36.0–46.0)
HEMOGLOBIN: 14.1 g/dL (ref 12.0–15.0)
LYMPHS PCT: 14 % (ref 12–46)
Lymphs Abs: 1.5 10*3/uL (ref 0.7–4.0)
MCH: 30.2 pg (ref 26.0–34.0)
MCHC: 35.3 g/dL (ref 30.0–36.0)
MCV: 85.7 fL (ref 78.0–100.0)
MONO ABS: 0.4 10*3/uL (ref 0.1–1.0)
MONOS PCT: 4 % (ref 3–12)
Neutro Abs: 8.6 10*3/uL — ABNORMAL HIGH (ref 1.7–7.7)
Neutrophils Relative %: 81 % — ABNORMAL HIGH (ref 43–77)
Platelets: 364 10*3/uL (ref 150–400)
RBC: 4.67 MIL/uL (ref 3.87–5.11)
RDW: 12.8 % (ref 11.5–15.5)
WBC: 10.6 10*3/uL — AB (ref 4.0–10.5)

## 2013-03-19 LAB — COMPREHENSIVE METABOLIC PANEL
ALBUMIN: 4 g/dL (ref 3.5–5.2)
ALK PHOS: 104 U/L (ref 39–117)
ALT: 15 U/L (ref 0–35)
AST: 16 U/L (ref 0–37)
BILIRUBIN TOTAL: 0.6 mg/dL (ref 0.3–1.2)
BUN: 7 mg/dL (ref 6–23)
CHLORIDE: 101 meq/L (ref 96–112)
CO2: 26 mEq/L (ref 19–32)
CREATININE: 0.66 mg/dL (ref 0.50–1.10)
Calcium: 9.1 mg/dL (ref 8.4–10.5)
GFR calc Af Amer: >90 mL/min (ref >90)
GFR calc non Af Amer: >90 mL/min (ref >90)
Glucose, Bld: 121 mg/dL — ABNORMAL HIGH (ref 70–99)
Potassium: 3.5 mEq/L — ABNORMAL LOW (ref 3.7–5.3)
Sodium: 139 mEq/L (ref 137–147)
Total Protein: 7.8 g/dL (ref 6.0–8.3)

## 2013-03-19 LAB — PREGNANCY, URINE: PREG TEST UR: NEGATIVE

## 2013-03-19 LAB — ETHANOL

## 2013-03-19 MED ORDER — ONDANSETRON HCL 4 MG PO TABS: 4.0000 mg | ORAL_TABLET | Freq: Three times a day (TID) | ORAL | Status: DC | PRN

## 2013-03-19 MED ORDER — IPRATROPIUM-ALBUTEROL 0.5-2.5 (3) MG/3ML IN SOLN
3.0000 mL | Freq: Four times a day (QID) | RESPIRATORY_TRACT | Status: DC | PRN
  Filled 2013-03-19: qty 3

## 2013-03-19 MED ORDER — DESVENLAFAXINE SUCCINATE ER 50 MG PO TB24
50.0000 mg | ORAL_TABLET | Freq: Every day | ORAL | Status: DC
  Administered 2013-03-19: 50 mg via ORAL
  Filled 2013-03-19 (×2): qty 1

## 2013-03-19 MED ORDER — HYDROXYZINE HCL 25 MG PO TABS
25.0000 mg | ORAL_TABLET | Freq: Every evening | ORAL | Status: DC | PRN
  Administered 2013-03-20: 25 mg via ORAL
  Filled 2013-03-19: qty 1

## 2013-03-19 MED ORDER — ALUM & MAG HYDROXIDE-SIMETH 200-200-20 MG/5ML PO SUSP: 30.0000 mL | ORAL | Status: DC | PRN

## 2013-03-19 MED ORDER — PANTOPRAZOLE SODIUM 40 MG PO TBEC
80.0000 mg | DELAYED_RELEASE_TABLET | Freq: Every day | ORAL | Status: DC
  Administered 2013-03-19 – 2013-03-23 (×5): 80 mg via ORAL
  Filled 2013-03-19 (×9): qty 2

## 2013-03-19 MED ORDER — TRAZODONE HCL 50 MG PO TABS
50.0000 mg | ORAL_TABLET | Freq: Every evening | ORAL | Status: DC | PRN
  Filled 2013-03-19: qty 1

## 2013-03-19 MED ORDER — MOMETASONE FURO-FORMOTEROL FUM 100-5 MCG/ACT IN AERO
2.0000 | INHALATION_SPRAY | Freq: Two times a day (BID) | RESPIRATORY_TRACT | Status: DC
  Administered 2013-03-19: 2 via RESPIRATORY_TRACT
  Filled 2013-03-19: qty 8.8

## 2013-03-19 MED ORDER — IPRATROPIUM-ALBUTEROL 0.5-2.5 (3) MG/3ML IN SOLN: 3.0000 mL | Freq: Four times a day (QID) | RESPIRATORY_TRACT | Status: DC | PRN

## 2013-03-19 MED ORDER — DIAZEPAM 5 MG PO TABS
5.0000 mg | ORAL_TABLET | Freq: Two times a day (BID) | ORAL | Status: DC | PRN
  Administered 2013-03-19: 2.5 mg via ORAL

## 2013-03-19 MED ORDER — MAGNESIUM HYDROXIDE 400 MG/5ML PO SUSP: 30.0000 mL | Freq: Every day | ORAL | Status: DC | PRN

## 2013-03-19 MED ORDER — ALBUTEROL SULFATE HFA 108 (90 BASE) MCG/ACT IN AERS: 2.0000 | INHALATION_SPRAY | Freq: Four times a day (QID) | RESPIRATORY_TRACT | Status: DC | PRN

## 2013-03-19 MED ORDER — ACETAMINOPHEN 325 MG PO TABS
650.0000 mg | ORAL_TABLET | Freq: Four times a day (QID) | ORAL | Status: DC | PRN
  Administered 2013-03-20 – 2013-03-23 (×4): 650 mg via ORAL
  Filled 2013-03-19 (×4): qty 2

## 2013-03-19 MED ORDER — DIAZEPAM 5 MG PO TABS
ORAL_TABLET | ORAL | Status: AC
  Filled 2013-03-19: qty 1

## 2013-03-19 MED ORDER — LORATADINE 10 MG PO TABS
10.0000 mg | ORAL_TABLET | Freq: Every day | ORAL | Status: DC
  Administered 2013-03-19: 10 mg via ORAL

## 2013-03-19 MED ORDER — VENLAFAXINE HCL ER 75 MG PO CP24
75.0000 mg | ORAL_CAPSULE | Freq: Every day | ORAL | Status: DC
  Filled 2013-03-19 (×2): qty 1
  Filled 2013-03-19: qty 2
  Filled 2013-03-19: qty 1

## 2013-03-19 MED ORDER — ACETAMINOPHEN 325 MG PO TABS: 650.0000 mg | ORAL_TABLET | ORAL | Status: DC | PRN

## 2013-03-19 MED ORDER — ALBUTEROL SULFATE HFA 108 (90 BASE) MCG/ACT IN AERS
1.0000 | INHALATION_SPRAY | RESPIRATORY_TRACT | Status: DC
  Administered 2013-03-20 – 2013-03-22 (×6): 2 via RESPIRATORY_TRACT
  Filled 2013-03-19 (×2): qty 6.7

## 2013-03-19 MED ORDER — LORATADINE 10 MG PO TABS
ORAL_TABLET | ORAL | Status: AC
  Administered 2013-03-19: 09:00:00 10 mg via ORAL
  Filled 2013-03-19: qty 1

## 2013-03-19 NOTE — Progress Notes (Signed)
Pt's referral has been faxed to the following facilities with bed availability:  [facility]- per [facility] can fax referral information   Nancy BambergerMariya Kyrin Garn Disposition [facility]

## 2013-03-19 NOTE — Tx Team (Signed)
Initial Interdisciplinary Treatment Plan  PATIENT STRENGTHS: (choose at least two) Ability for insight Average or above average intelligence Motivation for treatment/growth  PATIENT STRESSORS: Educational concerns Financial difficulties Medication change or noncompliance   PROBLEM LIST: Problem List/Patient Goals Date to be addressed Date deferred Reason deferred Estimated date of resolution  Depression 03/19/13     " I want normalcy and peace and be able to control my life" 03/19/13                                                DISCHARGE CRITERIA:  Improved stabilization in mood, thinking, and/or behavior Need for constant or close observation no longer present Verbal commitment to aftercare and medication compliance  PRELIMINARY DISCHARGE PLAN: Return to previous living arrangement Return to previous work or school arrangements  PATIENT/FAMIILY INVOLVEMENT: This treatment plan has been presented to and reviewed with the patient, Nancy Barnes, and/or family me   The patient and family have been given the opportunity to ask questions and make suggestions.  Nancy Barnes 03/19/2013, 4:11 PM

## 2013-03-19 NOTE — BH Assessment (Signed)
Received a call for a tele-assessment. Spoke with Oris DroneBrian D. Hyacinth MeekerMiller, MD who stated patient has a long history of depression approximately 20 years after the birth of one of her children. Pt has been prescribed Pristiq for approximately 5 1/2 years. Since 11/14 patient has stated to decompensate. Over the past several days, patient depression has worsen. Pt is experiencing racing thoughts and passive SI. It has been recommended that pt has some changes to her medication and it should be done inpatient due to how distraught the patient is.
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
## 2013-03-19 NOTE — ED Provider Notes (Signed)
CSN: [id number]     Arrival date & time 03/19/13  0055 History   First MD Initiated Contact with Patient 03/19/13 0114     Chief Complaint  Patient presents with  . V70.1     (Consider location/radiation/quality/duration/timing/severity/associated sxs/prior Treatment) HPI Comments: Pt presents with c/o Depression - she has a hx of bipolar d/o and has been treated with multiple different medications in the past including Pristiq which she has been on for approximately 5 years. She also uses Valium intermittently and has recently been started on Latuda in the last 2 weeks though she feels that she is becoming agitated and developing insomnia. Today she stated that she was unable to become comfortable, she has increasing depression and is now starting to have thoughts of passive suicidality though she has no plan. She states that she is unsure if she would be able to carry out a plan if she got to that point. She does have a friend that committed suicide in the past, she has had depression since she was pregnant with her child 21 years ago. He has no history of suicide attempts, and has had allergic reaction to Abilify the past. She denies hallucinations. She does have associated diarrhea when she becomes anxious and depressed and she had associated chest pain yesterday but has no chest pain today. No history of coronary disease. She does have a history of pneumomediastinum which occurred in the past spontaneously  The history is provided by the patient and medical records.    Past Medical History  Diagnosis Date  . Asthma   . Hypokalemia   . Anxiety   . Depression   . Seasonal allergies   . Environmental allergies     food/medication  . Pneumomediastinum    Past Surgical History  Procedure Laterality Date  . Tubal ligation    . Ablation    . Breast biopsy  04/2011   Family History  Problem Relation Age of Onset  . Heart attack Mother     Bypass x5  . Diabetes Mellitus II Father   .  Hypertension Father   . Allergies Mother   . Allergies Daughter   . Allergies Maternal Grandmother   . Atrial fibrillation Maternal Grandmother   . Stroke Maternal Grandmother    History  Substance Use Topics  . Smoking status: Never Smoker   . Smokeless tobacco: Not on file  . Alcohol Use: No   OB History   Grav Para Term Preterm Abortions TAB SAB Ect Mult Living                 Review of Systems  All other systems reviewed and are negative.      Allergies  Nsaids; Shellfish allergy; Amoxicillin; Fish allergy; Levaquin; Other; Penicillins; Prednisone; and Sulfa antibiotics  Home Medications   Current Outpatient Rx  Name  Route  Sig  Dispense  Refill  . cetirizine (ZYRTEC) 10 MG tablet   Oral   Take 10 mg by mouth daily.         Marland Kitchen desvenlafaxine (PRISTIQ) 50 MG 24 hr tablet   Oral   Take 50 mg by mouth daily.         . diazepam (VALIUM) 5 MG tablet   Oral   Take 1 tablet (5 mg total) by mouth 3 (three) times daily.   21 tablet   0   . EPIPEN 2-PAK 0.3 MG/0.3ML SOAJ      Use as directed         .  Fluticasone-Salmeterol (ADVAIR DISKUS) 250-50 MCG/DOSE AEPB   Inhalation   Inhale 1 puff into the lungs 2 (two) times daily.   1 each   0   . ipratropium-albuterol (DUONEB) 0.5-2.5 (3) MG/3ML SOLN   Nebulization   Take 3 mLs by nebulization every 6 (six) hours as needed.   360 mL   5   . VENTOLIN HFA 108 (90 BASE) MCG/ACT inhaler      2 PUFFS EVERY 6 HOURS AS NEEDED FOR WHEEZING   18 each   2    BP 132/90  Pulse 95  Temp(Src) 98.9 F (37.2 C)  Resp 18  Ht 5' 4.5" (1.638 m)  Wt 174 lb (78.926 kg)  BMI 29.42 kg/m2  SpO2 100% Physical Exam  Nursing note and vitals reviewed. Constitutional: She appears well-developed and well-nourished. No distress.  HENT:  Head: Normocephalic and atraumatic.  Mouth/Throat: Oropharynx is clear and moist. No oropharyngeal exudate.  Eyes: Conjunctivae and EOM are normal. Pupils are equal, round, and reactive  to light. Right eye exhibits no discharge. Left eye exhibits no discharge. No scleral icterus.  Neck: Normal range of motion. Neck supple. No JVD present. No thyromegaly present.  Cardiovascular: Normal rate, regular rhythm, normal heart sounds and intact distal pulses.  Exam reveals no gallop and no friction rub.   No murmur heard. Pulmonary/Chest: Effort normal and breath sounds normal. No respiratory distress. She has no wheezes. She has no rales.  Abdominal: Soft. Bowel sounds are normal. She exhibits no distension and no mass. There is no tenderness.  Musculoskeletal: Normal range of motion. She exhibits no edema and no tenderness.  Lymphadenopathy:    She has no cervical adenopathy.  Neurological: She is alert. Coordination normal.  Skin: Skin is warm and dry. No rash noted. No erythema.  Psychiatric:  Depressed mood, tearful, no hallucinations    ED Course  Procedures (including critical care time) Labs Review Labs Reviewed  CBC WITH DIFFERENTIAL - Abnormal; Notable for the following:    WBC 10.6 (*)    Neutrophils Relative % 81 (*)    Neutro Abs 8.6 (*)    All other components within normal limits  COMPREHENSIVE METABOLIC PANEL - Abnormal; Notable for the following:    Potassium 3.5 (*)    Glucose, Bld 121 (*)    All other components within normal limits  URINE RAPID DRUG SCREEN (HOSP PERFORMED)  ETHANOL  PREGNANCY, URINE   Imaging Review No results found.   EKG Interpretation   Date/Time:  Saturday March 19 2013 06:40:06 EST Ventricular Rate:  92 PR Interval:  148 QRS Duration: 78 QT Interval:  354 QTC Calculation: 437 R Axis:   14 Text Interpretation:  Normal sinus rhythm Normal ECG Since last tracing  rate slower Confirmed by Harriette Tovey  MD, Kemara Quigley ([id number]) on 03/19/2013 6:44:45 AM      MDM   Final diagnoses:  Depression    The patient denies any substance abuse, she has an unremarkable physical exam other than her anxiety and depression, she will need to be  seen by psychiatry for medication management and consideration for admission to the hospital given her severe depression.  [facility] - [facility] agree with admission to inpatient facility - pt has been stable overnight - change of shift at 7 AM - will sign out to oncoming physician.      Vida RollerBrian D Kaeden Depaz, MD 03/19/13 [phone]

## 2013-03-19 NOTE — Progress Notes (Signed)
Voluntary admission for a 43 year old female with flat affect, anxious and depressed mood.  Pt. Reports that she had been taking Prestique for the last 3 years but  Recently started having some anxiety.   Her doctor changed her medication to JordanLatuda which she took a dose on 03/18/13  and  started "not  Feeling just right, having racing thoughts and uncontrollable shaking."  On the way to the hospital pt. Felt that she didn't want to die but it would not be an issue if she did.    Presently  Denies SI/HI and denies A/V hallucinations.  Pt. Oriented to unit.  Support given.

## 2013-03-19 NOTE — ED Notes (Signed)
Patient states she took a first dose of Latuda at 7pm; states she cannot sit still and cannot sleep.  Patient states symptoms began at 10pm.

## 2013-03-19 NOTE — ED Notes (Signed)
Pt's mother at bedside visiting.

## 2013-03-19 NOTE — BH Assessment (Signed)
[facility] Assessment Progress Note  Update:  [facility] Jacquelyne BalintShalita Forrest, AC, to Dr. Elsie SaasJonnalagadda to bed 507-2.  Informed pt's nurse Joselyn Glassmanyler and EDP Horton @ [phone].  Pt's nurse to complete support paperwork with pt.  [facility]  Casimer LaniusKristen Illeana Edick, MS, [facility] Licensed Professional Counselor Triage Specialist

## 2013-03-19 NOTE — Progress Notes (Signed)
The focus of this group is to help patients review their daily goal of treatment and discuss progress on daily workbooks. Pt attended the evening group session and responded to all discussion prompts from the Writer. Pt shared that today was a good day on the unit, the highlight of which was how nice her hallway turned out to be. "I was scared of this place before I got here, but it's turned out better than I expected." Pt also shared that she received surprise visits from her family, which was another highlight. Pt's only additional request this evening was to receive another blanket, which was given to her following group. Pt's affect was appropriate.

## 2013-03-20 DIAGNOSIS — F332 Major depressive disorder, recurrent severe without psychotic features: Principal | ICD-10-CM

## 2013-03-20 DIAGNOSIS — F411 Generalized anxiety disorder: Secondary | ICD-10-CM

## 2013-03-20 MED ORDER — IPRATROPIUM BROMIDE 0.02 % IN SOLN: 0.5000 mg | Freq: Four times a day (QID) | RESPIRATORY_TRACT | Status: DC | PRN

## 2013-03-20 MED ORDER — ALBUTEROL SULFATE (2.5 MG/3ML) 0.083% IN NEBU: 2.5000 mg | INHALATION_SOLUTION | Freq: Four times a day (QID) | RESPIRATORY_TRACT | Status: DC | PRN

## 2013-03-20 NOTE — [facility] Suicide Risk Assessment (Signed)
Suicide Risk Assessment  Admission Assessment     Nursing information obtained from:  Patient Demographic factors:  Caucasian;Access to firearms Current Mental Status:   (Denies) Loss Factors:  Financial problems / change in socioeconomic status Historical Factors:   (verbal, emotional abuse) Risk Reduction Factors:  Sense of responsibility to family;Religious beliefs about death;Employed;Living with another person, especially a relative Total Time spent with patient: 1 hour  CLINICAL FACTORS:   Depression:   Anhedonia Hopelessness Dysthymia Unstable or Poor Therapeutic Relationship Previous Psychiatric Diagnoses and Treatments  Psychiatric Specialty Exam:     Blood pressure 154/108, pulse 69, temperature 98.2 F (36.8 C), temperature source Oral, resp. rate 17, height 5\' 5"  (1.651 m), weight 78.019 kg (172 lb).Body mass index is 28.62 kg/(m^2).  General Appearance: Casual  Eye Contact::  Fair  Speech:  Normal Rate  Volume:  Decreased  Mood:  Dysphoric  Affect:  Congruent  Thought Process:  Linear  Orientation:  Full (Time, Place, and Person)  Thought Content:  Rumination  Suicidal Thoughts:  No  Homicidal Thoughts:  No  Memory:  Recent;   Fair  Judgement:  Poor  Insight:  Shallow  Psychomotor Activity:  Decreased  Concentration:  Fair  Recall:  FiservFair  Fund of Knowledge:Fair  Language: Fair  Akathisia:  Negative  Handed:  Right  AIMS (if indicated):     Assets:  Communication Skills Leisure Time  Sleep:  Number of Hours: 5.75   Musculoskeletal: Strength & Muscle Tone: within normal limits Gait & Station: normal Patient leans: Front  COGNITIVE FEATURES THAT CONTRIBUTE TO RISK:  Closed-mindedness Polarized thinking    SUICIDE RISK:   Mild:  Suicidal ideation of limited frequency, intensity, duration, and specificity.  There are no identifiable plans, no associated intent, mild dysphoria and related symptoms, good self-control (both objective and subjective  assessment), few other risk factors, and identifiable protective factors, including available and accessible social support.  PLAN OF CARE:  I certify that inpatient services furnished can reasonably be expected to improve the patient's condition.  Nancy Barnes, Mihira Tozzi  MD  03/20/2013, 10:18 AM

## 2013-03-20 NOTE — Progress Notes (Signed)
Patient ID: Nancy ChurchKelly Toya, female   DOB: October 26, 1970, 43 y.o.   MRN: [id number] D)  Has been pleasant, cooperative, has been out on the hall and dayroom, participating in the milieu.  Attended group this evening, voiced her worries about having her meds adjusted, with so many allergies.  Received supportive comments from peers and was appreciative but still apprehensive.  Refused albuterol inhaler tonight, agreed to notify staf if she felt she needed it.  Went to bed fairly early, stated was tired, voiced no other c/o's. A)  Will continue to monitor for safety, support, continue POC R)  Safety maintained at this time.

## 2013-03-20 NOTE — H&P (Signed)
Psychiatric Admission Assessment Adult  Patient Identification:  Nancy Barnes Date of Evaluation:  03/20/2013 Chief Complaint:  MDD History of Present Illness::  Nancy Barnes is an 43 y.o. female presenting to [facility] due to ongoing issues with depression. Pt reported that she has been suffering with depression for the past 21 years. Pt reported that she has nervous thoughts and anxiety all the time. Pt also shared that she was prescribed Latuda and tonight was her first night taking it. Pt reported that her body does not like the medication and described the side effects as racing thoughts, shivering, diarrhea and her nerves being everywhere.   During admission assessment, pt rates her anxiety at 3-4/10 and depression at 0/10. Denies SI, HI, and AVH, contracts for safety. Pt's goals here are to balance her medications, learn coping mechanisms and learn how to "just be happy and be normal and not feel hopeless". Pt reports that she wants to take Pristiq (verified Rx for $Remo'50mg'ntPTF$  daily with [facility] in [place], [place]). Pt was informed that we only have Effexor here inpatient. Pt will have home meds brought in for [place] to dispense. 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    Elements:  Location:  Generalized, inpatient [facility]. Quality:  Worsening. Severity:  Severe. Timing:  Constant. Duration:  Chronic. Associated Signs/Synptoms: Depression Symptoms:  depressed mood, psychomotor retardation, difficulty concentrating, hopelessness, recurrent thoughts of death, anxiety, panic attacks, weight gain, (Hypo) Manic Symptoms:  Denies Anxiety Symptoms:  Excessive Worry, Psychotic Symptoms:  Denies PTSD Symptoms: Denies Total Time spent with patient: Greater than 30 minutes   Psychiatric Specialty Exam: Physical Exam  Review of Systems  Constitutional: Negative.   HENT: Negative.   Eyes: Negative.    Respiratory: Negative.   Cardiovascular: Negative.   Gastrointestinal: Negative.   Genitourinary: Negative.   Musculoskeletal: Negative.   Skin: Negative.   Neurological: Negative.   Endo/Heme/Allergies: Negative.   Psychiatric/Behavioral: Positive for depression. Negative for suicidal ideas. The patient is nervous/anxious. The patient does not have insomnia.     Blood pressure 154/108, pulse 69, temperature 98.2 F (36.8 C), temperature source Oral, resp. rate 17, height $RemoveBe'5\' 5"'IyoXqpagv$  (1.651 m), weight 78.019 kg (172 lb).Body mass index is 28.62 kg/(m^2).  General Appearance: Casual  Eye Contact::  Good  Speech:  Clear and Coherent  Volume:  Normal  Mood:  Anxious and Depressed  Affect:  Depressed  Thought Process:  Coherent  Orientation:  Full (Time, Place, and Person)  Thought Content:  WDL  Suicidal Thoughts:  No  Homicidal Thoughts:  No  Memory:  Immediate;   Good Recent;   Good Remote;   Good  Judgement:  Good  Insight:  Good  Psychomotor Activity:  Normal  Concentration:  Good  Recall:  Good  Fund of Knowledge:Good  Language: Good  Akathisia:  NA  Handed:  Right  AIMS (if indicated):     Assets:  Communication Skills Desire for Improvement Resilience  Sleep:  Number of Hours: 5.75    Musculoskeletal: Strength & Muscle Tone: within normal limits Gait & Station: normal Patient leans: N/A  Past Psychiatric History: Diagnosis: MDD with suicidal ideation (no plan)  Hospitalizations: Denies  Outpatient Care: Dr. Lissa Hoard ([facility]), counseling for panic/anxiety  Substance Abuse Care:Denies  Self-Mutilation: Denies  Suicidal Attempts: Denies  Violent Behaviors: Denies   Past Medical History:   Past Medical History  Diagnosis  Date  . Asthma   . Hypokalemia   . Anxiety   . Depression   . Seasonal allergies   . Environmental allergies     food/medication  . Pneumomediastinum    None. Allergies:   Allergies  Allergen Reactions  . Nsaids Shortness Of Breath   . Shellfish Allergy Anaphylaxis  . Abilify [Aripiprazole] Other (See Comments)    hallucinations  . Amoxicillin   . Fish Allergy Other (See Comments)    Reaction unknown  . Latuda [Lurasidone Hcl] Other (See Comments)    Uncontrollable shaking, hopelessness.  [place] [Levofloxacin In D5w] Other (See Comments)    Reaction unknown  . Other Other (See Comments)    Reaction unknown to nuts  . Penicillins Other (See Comments)    Reaction unknown  . Prednisone Other (See Comments)    Pruritus/ hives  . Seroquel [Quetiapine Fumarate] Other (See Comments)    Patient states it made her not care about the world and she just sat down and didn't get up.  . Sulfa Antibiotics Other (See Comments)    Reaction unknown  . Ativan [Lorazepam] Anxiety    Shakiness  . Xanax [Alprazolam] Anxiety    Makes pt. anxious   PTA Medications: Prescriptions prior to admission  Medication Sig Dispense Refill  . desvenlafaxine (PRISTIQ) 50 MG 24 hr tablet Take 50 mg by mouth daily.      . diazepam (VALIUM) 5 MG tablet Take 2.5 mg by mouth daily as needed for anxiety.      . Fluticasone-Salmeterol (ADVAIR DISKUS) 250-50 MCG/DOSE AEPB Inhale 1 puff into the lungs 2 (two) times daily.  1 each  0  . Lurasidone HCl (LATUDA) 20 MG TABS Take 1 tablet by mouth daily.      Marland Kitchen omeprazole (PRILOSEC) 20 MG capsule Take 20 mg by mouth daily.      . Potassium Gluconate 595 MG CAPS Take 1 capsule by mouth daily.      . VENTOLIN HFA 108 (90 BASE) MCG/ACT inhaler 2 PUFFS EVERY 6 HOURS AS NEEDED FOR WHEEZING  18 each  2  . EPIPEN 2-PAK 0.3 MG/0.3ML SOAJ Use as directed      . ipratropium-albuterol (DUONEB) 0.5-2.5 (3) MG/3ML SOLN Take 3 mLs by nebulization every 6 (six) hours as needed (shortness of breath/wheezing.).        Previous Psychotropic Medications:  Medication/Dose  SEE MAR               Substance Abuse History in the last 12 months:  no  Consequences of Substance Abuse: NA  Social History:   reports that she has never smoked. She does not have any smokeless tobacco history on file. She reports that she does not drink alcohol or use illicit drugs. Additional Social History: Pain Medications: denies Prescriptions: See PTA Over the Counter: denies History of alcohol / drug use?: No history of alcohol / drug abuse                    Current Place of Residence:  [place], [place] Place of Birth:  [place], [place] Family Members: Parents Marital Status:  Divorced Children:  Sons: 1  Daughters: 1 Relationships: Single Education: Some Dentist Problems/Performance: Denies Religious Beliefs/Practices: Baptist History of Abuse (Emotional/Phsycial/Sexual) Occupational Experiences; Research officer, trade union, [employer] (Optometrist History: Denies Legal History: Denies Hobbies/Interests: Knit, photography  Family History:   Family History  Problem Relation Age of Onset  . Heart attack Mother  Bypass x5  . Diabetes Mellitus II Father   . Hypertension Father   . Allergies Mother   . Allergies Daughter   . Allergies Maternal Grandmother   . Atrial fibrillation Maternal Grandmother   . Stroke Maternal Grandmother     Results for orders placed during the hospital encounter of 03/19/13 (from the past 72 hour(s))  URINE RAPID DRUG SCREEN (HOSP PERFORMED)     Status: None   Collection Time    03/19/13  1:19 AM      Result Value Ref Range   Opiates NONE DETECTED  NONE DETECTED   Cocaine NONE DETECTED  NONE DETECTED   Benzodiazepines NONE DETECTED  NONE DETECTED   Amphetamines NONE DETECTED  NONE DETECTED   Tetrahydrocannabinol NONE DETECTED  NONE DETECTED   Barbiturates NONE DETECTED  NONE DETECTED   Comment:            DRUG SCREEN FOR MEDICAL PURPOSES     ONLY.  IF CONFIRMATION IS NEEDED     FOR ANY PURPOSE, NOTIFY LAB     WITHIN 5 DAYS.                LOWEST DETECTABLE LIMITS     FOR URINE DRUG SCREEN     Drug Class       Cutoff (ng/mL)      Amphetamine      1000     Barbiturate      200     Benzodiazepine   885     Tricyclics       027     Opiates          300     Cocaine          300     THC              50  PREGNANCY, URINE     Status: None   Collection Time    03/19/13  1:19 AM      Result Value Ref Range   Preg Test, Ur NEGATIVE  NEGATIVE   Comment:            THE SENSITIVITY OF THIS     METHODOLOGY IS >20 mIU/mL.  CBC WITH DIFFERENTIAL     Status: Abnormal   Collection Time    03/19/13  1:26 AM      Result Value Ref Range   WBC 10.6 (*) 4.0 - 10.5 K/uL   RBC 4.67  3.87 - 5.11 MIL/uL   Hemoglobin 14.1  12.0 - 15.0 g/dL   HCT 40.0  36.0 - 46.0 %   MCV 85.7  78.0 - 100.0 fL   MCH 30.2  26.0 - 34.0 pg   MCHC 35.3  30.0 - 36.0 g/dL   RDW 12.8  11.5 - 15.5 %   Platelets 364  150 - 400 K/uL   Neutrophils Relative % 81 (*) 43 - 77 %   Neutro Abs 8.6 (*) 1.7 - 7.7 K/uL   Lymphocytes Relative 14  12 - 46 %   Lymphs Abs 1.5  0.7 - 4.0 K/uL   Monocytes Relative 4  3 - 12 %   Monocytes Absolute 0.4  0.1 - 1.0 K/uL   Eosinophils Relative 1  0 - 5 %   Eosinophils Absolute 0.1  0.0 - 0.7 K/uL   Basophils Relative 0  0 - 1 %   Basophils Absolute 0.0  0.0 - 0.1 K/uL  COMPREHENSIVE METABOLIC PANEL  Status: Abnormal   Collection Time    03/19/13  1:27 AM      Result Value Ref Range   Sodium 139  137 - 147 mEq/L   Potassium 3.5 (*) 3.7 - 5.3 mEq/L   Chloride 101  96 - 112 mEq/L   CO2 26  19 - 32 mEq/L   Glucose, Bld 121 (*) 70 - 99 mg/dL   BUN 7  6 - 23 mg/dL   Creatinine, Ser 0.66  0.50 - 1.10 mg/dL   Calcium 9.1  8.4 - 10.5 mg/dL   Total Protein 7.8  6.0 - 8.3 g/dL   Albumin 4.0  3.5 - 5.2 g/dL   AST 16  0 - 37 U/L   ALT 15  0 - 35 U/L   Alkaline Phosphatase 104  39 - 117 U/L   Total Bilirubin 0.6  0.3 - 1.2 mg/dL   GFR calc non Af Amer >90  >90 mL/min   GFR calc Af Amer >90  >90 mL/min   Comment: (NOTE)     The eGFR has been calculated using the CKD EPI equation.     This calculation has not been  validated in all clinical situations.     eGFR's persistently <90 mL/min signify possible Chronic Kidney     Disease.  ETHANOL     Status: None   Collection Time    03/19/13  1:27 AM      Result Value Ref Range   Alcohol, Ethyl (B) <11  0 - 11 mg/dL   Comment:            LOWEST DETECTABLE LIMIT FOR     SERUM ALCOHOL IS 11 mg/dL     FOR MEDICAL PURPOSES ONLY   Psychological Evaluations:  Assessment:   DSM5: Depressive Disorders:  Major Depressive Disorder - Severe (296.23)  AXIS I:  Generalized Anxiety Disorder and Major Depression, Recurrent severe AXIS II:  Deferred AXIS III:   Past Medical History  Diagnosis Date  . Asthma   . Hypokalemia   . Anxiety   . Depression   . Seasonal allergies   . Environmental allergies     food/medication  . Pneumomediastinum    AXIS IV:  other psychosocial or environmental problems and problems related to social environment AXIS V:  41-50 serious symptoms  Treatment Plan/Recommendations:   Review of chart, vital signs, medications, and notes.  1-Individual and group therapy  2-Medication management for depression and anxiety: Medications reviewed with the patient and she stated no untoward effects, unchanged. 3-Coping skills for depression, anxiety  4-Continue crisis stabilization and management  5-Address health issues--monitoring vital signs, stable  6-Treatment plan in progress to prevent relapse of depression and anxiety  Treatment Plan Summary: Daily contact with patient to assess and evaluate symptoms and progress in treatment Medication management Current Medications:  Current Facility-Administered Medications  Medication Dose Route Frequency Provider Last Rate Last Dose  . acetaminophen (TYLENOL) tablet 650 mg  650 mg Oral Q6H PRN Benjamine Mola, FNP   650 mg at 03/20/13 1029  . albuterol (PROVENTIL HFA;VENTOLIN HFA) 108 (90 BASE) MCG/ACT inhaler 1-2 puff  1-2 puff Inhalation Q4H Benjamine Mola, FNP      . albuterol  (PROVENTIL) (2.5 MG/3ML) 0.083% nebulizer solution 2.5 mg  2.5 mg Nebulization Q6H PRN Durward Parcel, MD       And  . ipratropium (ATROVENT) nebulizer solution 0.5 mg  0.5 mg Nebulization Q6H PRN Durward Parcel, MD      .  alum & mag hydroxide-simeth (MAALOX/MYLANTA) 200-200-20 MG/5ML suspension 30 mL  30 mL Oral Q4H PRN Benjamine Mola, FNP      . hydrOXYzine (ATARAX/VISTARIL) tablet 25 mg  25 mg Oral QHS PRN Benjamine Mola, FNP      . magnesium hydroxide (MILK OF MAGNESIA) suspension 30 mL  30 mL Oral Daily PRN Benjamine Mola, FNP      . pantoprazole (PROTONIX) EC tablet 80 mg  80 mg Oral Daily Benjamine Mola, FNP   80 mg at 03/20/13 0820  . traZODone (DESYREL) tablet 50 mg  50 mg Oral QHS PRN Benjamine Mola, FNP      . venlafaxine XR (EFFEXOR-XR) 24 hr capsule 75 mg  75 mg Oral Q breakfast Benjamine Mola, FNP        Observation Level/Precautions:  15 minute checks  Laboratory:  Labs resulted, reviewed, and stable at this time.   Psychotherapy:  Group therapy, individual therapy, psychoeducation  Medications:  See MAR above  Consultations: None    Discharge Concerns: None    Estimated LOS: 5-7 days  Other:  N/A   I certify that inpatient services furnished can reasonably be expected to improve the patient's condition.   Benjamine Mola, FNP-BC 3/8/20153:09 PM I have examined the patient and agreed with the findings of H&P and treatment plan. I have done suicide assessment and agree with care plan.

## 2013-03-20 NOTE — Progress Notes (Signed)
[facility] Group Notes:  (Nursing/MHT/Case Management/Adjunct)  Date:  03/20/2013  Time:  2:05 PM  Type of Therapy:  Psychoeducational Skills  Participation Level:  Did Not Attend  Participation Quality:  Did not attend  Affect:  Did not attend  Cognitive:  Did not attend  Insight:  None  Engagement in Group:  Did not attend  Modes of Intervention:  Did not attend  Summary of Progress/Problems: Did not attend personal inventory group despite staff encouragement.  Waylin Dorko R 03/20/2013, 2:05 PM 
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
## 2013-03-20 NOTE — Progress Notes (Signed)
03-20-13 NSG NOTE 7a-3p D: Affect is depressed. Mood is depressed. Behavior is cooperative with encouragement, direction and support. Interacts appropriately with peers and staff with direction.  Did not participate in RN group, participated in counselor lead group with staff encouragement. Reports good sleep, good appetite, normal energy levels and improving ability to pay attention. Focus for today healthy support systems.  Upset want MD to start her back on [employer].  A: Medications per MD order. Support given throughout day. 1:1 time spent with pt. R: Following treatment plan.  Denies HI/SI, auditory or visual hallucinations. Contracts for safety.

## 2013-03-20 NOTE — [facility] Group Notes (Signed)
[facility] Group Notes:  (Clinical Social Work)  03/20/2013   1:15-2:15PM  Summary of Progress/Problems:  The main focus of today's process group was to identify the patient's current support system and decide on other supports that can be put in place.  The picture on workbook was used to discuss why additional supports are needed.  An emphasis was placed on using counselor, doctor, therapy groups, 12-step groups, and problem-specific support groups to expand supports.   There was also an extensive discussion about what constitutes a healthy support versus an unhealthy support.  The patient expressed full comprehension of the concepts presented, and agreed that there is a need to add more supports.  The patient stated that she has no idea what current supports there are for her.  We talked at length about support groups, and she is willing to add this as one additional support, as well as to work with a therapist to figure out how to limit her family's role in her life since they are so negative about mental illness.  Type of Therapy:  Process Group  Participation Level:  Active  Participation Quality:  Attentive and Sharing  Affect:  Blunted and Depressed  Cognitive:  Appropriate and Oriented  Insight:  Engaged  Engagement in Therapy:  Engaged  Modes of Intervention:  Education,  Support and [employer]  Nancy Willert Grossman-Orr, LCSW 03/20/2013, 4:00pm

## 2013-03-21 MED ORDER — DESVENLAFAXINE SUCCINATE ER 50 MG PO TB24
50.0000 mg | ORAL_TABLET | Freq: Every day | ORAL | Status: DC
  Administered 2013-03-21 – 2013-03-22 (×2): 50 mg via ORAL

## 2013-03-21 NOTE — Progress Notes (Addendum)
[facility] MD Progress Note  03/21/2013 6:08 PM Nancy Barnes  MRN:  [id number] Subjective:  Nancy Barnes is an 43 y.o. female presenting to [facility] due to ongoing issues with depression. Pt reported that she has been suffering with depression for the past 21 years. Pt reported that she has nervous thoughts and anxiety all the time. Pt also shared that she was prescribed Latuda and tonight was her first night taking it. Pt reported that her body does not like the medication and described the side effects as racing thoughts, shivering, diarrhea and her nerves being everywhere.   During today's assessment, pt rates anxiety at 6/10 and depression at 5/10. Pt states that her "medications are working great now that I'm back on the Pristiq". Pt reports many stressors in her life, including a feeling of lost sense of identity, stating that she had a certain identity with her husband, that he almost enabled her to be a certain way, and now that he's gone, she is having trouble letting the past go and moving forward. Pt states that she "always tries to help others, but they are not thankful, like it is never enough".   Diagnosis:   DSM5:  Depressive Disorders:  Major Depressive Disorder - Severe (296.23) Total Time spent with patient: Greater than 25 minutes  Axis I: Generalized Anxiety Disorder and Major Depression, Recurrent severe Axis II: Deferred Axis III:  Past Medical History  Diagnosis Date  . Asthma   . Hypokalemia   . Anxiety   . Depression   . Seasonal allergies   . Environmental allergies     food/medication  . Pneumomediastinum    Axis IV: other psychosocial or environmental problems and problems related to social environment Axis V: 41-50 serious symptoms  ADL's:  Intact  Sleep: Good  Appetite:  Good  Suicidal Ideation:  Denies Homicidal Ideation:  Denies AEB (as evidenced by):  Psychiatric Specialty Exam: Physical Exam  Review of Systems  Constitutional: Negative.   HENT:  Negative.   Eyes: Negative.   Respiratory: Negative.   Cardiovascular: Negative.   Gastrointestinal: Negative.   Genitourinary: Negative.   Musculoskeletal: Negative.   Skin: Negative.   Neurological: Negative.   Endo/Heme/Allergies: Negative.     Blood pressure 136/94, pulse 92, temperature 98.3 F (36.8 C), temperature source Oral, resp. rate 18, height 5\' 5"  (1.651 m), weight 78.019 kg (172 lb).Body mass index is 28.62 kg/(m^2).  General Appearance: Casual  Eye Contact::  Good  Speech:  Clear and Coherent  Volume:  Normal  Mood:  Anxious  Affect:  Appropriate  Thought Process:  Coherent  Orientation:  Full (Time, Place, and Person)  Thought Content:  WDL  Suicidal Thoughts:  No  Homicidal Thoughts:  No  Memory:  Immediate;   Good Recent;   Good Remote;   Good  Judgement:  Fair  Insight:  Fair  Psychomotor Activity:  Normal  Concentration:  Good  Recall:  Good  Fund of Knowledge:Good  Language: Good  Akathisia:  NA  Handed:  Right  AIMS (if indicated):     Assets:  Communication Skills Desire for Improvement Resilience  Sleep:  Number of Hours: 6.75   Musculoskeletal: Strength & Muscle Tone: within normal limits Gait & Station: normal Patient leans: N/A  Current Medications: Current Facility-Administered Medications  Medication Dose Route Frequency Provider Last Rate Last Dose  . acetaminophen (TYLENOL) tablet 650 mg  650 mg Oral Q6H PRN Beau Fanny, FNP   650 mg at 03/21/13 1438  .  albuterol (PROVENTIL HFA;VENTOLIN HFA) 108 (90 BASE) MCG/ACT inhaler 1-2 puff  1-2 puff Inhalation Q4H Beau FannyJohn C Withrow, FNP   2 puff at 03/21/13 1303  . albuterol (PROVENTIL) (2.5 MG/3ML) 0.083% nebulizer solution 2.5 mg  2.5 mg Nebulization Q6H PRN Nehemiah SettleJanardhaha R Brucha Ahlquist, MD       And  . ipratropium (ATROVENT) nebulizer solution 0.5 mg  0.5 mg Nebulization Q6H PRN Nehemiah SettleJanardhaha R Deborrah Mabin, MD      . alum & mag hydroxide-simeth (MAALOX/MYLANTA) 200-200-20 MG/5ML suspension 30  mL  30 mL Oral Q4H PRN Beau FannyJohn C Withrow, FNP      . desvenlafaxine (PRISTIQ) 24 hr tablet 50 mg  50 mg Oral Daily Nehemiah SettleJanardhaha R Gentle Hoge, MD   50 mg at 03/21/13 1057  . hydrOXYzine (ATARAX/VISTARIL) tablet 25 mg  25 mg Oral QHS PRN Beau FannyJohn C Withrow, FNP   25 mg at 03/20/13 2002  . magnesium hydroxide (MILK OF MAGNESIA) suspension 30 mL  30 mL Oral Daily PRN Beau FannyJohn C Withrow, FNP      . pantoprazole (PROTONIX) EC tablet 80 mg  80 mg Oral Daily Beau FannyJohn C Withrow, FNP   80 mg at 03/21/13 0806  . traZODone (DESYREL) tablet 50 mg  50 mg Oral QHS PRN Beau FannyJohn C Withrow, FNP        Lab Results: No results found for this or any previous visit (from the past 48 hour(s)).  Physical Findings: AIMS: Facial and Oral Movements Muscles of Facial Expression: None, normal Lips and Perioral Area: None, normal Jaw: None, normal Tongue: None, normal,Extremity Movements Upper (arms, wrists, hands, fingers): None, normal Lower (legs, knees, ankles, toes): None, normal, Trunk Movements Neck, shoulders, hips: None, normal, Overall Severity Severity of abnormal movements (highest score from questions above): None, normal Incapacitation due to abnormal movements: None, normal Patient's awareness of abnormal movements (rate only patient's report): No Awareness, Dental Status Current problems with teeth and/or dentures?: No Does patient usually wear dentures?: No  CIWA:  CIWA-Ar Total: 1 COWS:  COWS Total Score: 2  Treatment Plan Summary: Daily contact with patient to assess and evaluate symptoms and progress in treatment Medication management  Plan: Review of chart, vital signs, medications, and notes.  1-Individual and group therapy  2-Medication management for depression and anxiety: Medications reviewed with the patient and she stated no untoward effects, unchanged. 3-Coping skills for depression, anxiety  4-Continue crisis stabilization and management  5-Address health issues--monitoring vital signs, stable   6-Treatment plan in progress to prevent relapse of depression and anxiety  Medical Decision Making Problem Points:  Established problem, stable/improving (1), Review of last therapy session (1) and Review of psycho-social stressors (1) Data Points:  Review or order clinical lab tests (1) Review or order medicine tests (1) Review of medication regiment & side effects (2) Review of new medications or change in dosage (2)  I certify that inpatient services furnished can reasonably be expected to improve the patient's condition.   Beau FannyWithrow, John C, FNP-BC 03/21/2013, 6:08 PM   Reviewed the information documented and agree with the treatment plan.  Rafael Salway,JANARDHAHA R. 03/22/2013 1:52 PM
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
## 2013-03-21 NOTE — [facility] Group Notes (Signed)
[facility]   03/21/2013 12:15 PM    Participation Quality:  Appropraite  Mood/Affect:  Appropriate  Depression Rating:  4  Anxiety Rating:  6  Thoughts of Suicide:  No  Will you contract for safety?   NA  Current AVH:  No  Plan for Discharge/Comments:  Patient attended discharge planning group and actively participated in group.  She advised she was seen by Anne Fulay Shugart at [facility] but does not want to seen by him at discharge.  She was also seeing [name] at [facility] but would want to continue with her for counseling. CSW provided all participants with daily workbook.   Transportation Means: Patient has transportation.   Supports:  Patient has a support system.   Denishia Citro, Joesph JulyQuylle Hairston

## 2013-03-21 NOTE — Progress Notes (Signed)
D:  Patient's self inventory sheet, patient has fair sleep, good appetite, low energy level, improving attention span.  Rated depression 6, hopeless 1.  Denied withdrawals.  Denied SI.  Physical problems, dizziness, diarrhea, nervousness, headaches.  Worst pain 3, zero pain goal.  Plans to interact more with others, be more positive, learn to stop people pleasing, easy into new life.  Tell people if fish fried with same food.  Tomasa BlaseBacon and [employer] Companyhashbrowns taste like fish! Allergy.   Nerves messed up this morning.  Several incidents yesterday made nervous.  EMT-B.  Here for nerves, anxiety, depression.  Woman screaming last night.  Woke up thinking someone harming her daughter.  Nerves are off today. A:  Medications administered per MD orders.  Emotional support and encouragement given patient. R:  Denied SI and HI.  Denied A/V hallucinations.  Will continue to monitor patient for safety with 15 minute checks.  Safety maintained.

## 2013-03-21 NOTE — Tx Team (Signed)
Interdisciplinary Treatment Plan Update   Date Reviewed:  03/21/2013  Time Reviewed:  9:58 AM  Progress in Treatment:   Attending groups: Yes Participating in groups: Yes Taking medication as prescribed: Yes  Tolerating medication: Yes Family/Significant other contact made:  No, but will ask patient for consent for collateral contact Patient understands diagnosis: Yes  Discussing patient identified problems/goals with staff: Yes Medical problems stabilized or resolved: Yes Denies suicidal/homicidal ideation: Yes Patient has not harmed self or others: Yes  For review of initial/current patient goals, please see plan of care.  Estimated Length of Stay:  2-3 days  Reasons for Continued Hospitalization:  Anxiety Depression Medication stabilization   New Problems/Goals identified:    Discharge Plan or Barriers:   Home with outpatient follow up to be determined  Additional Comments:    Nancy Barnes is an 43 y.o. female presenting to [facility] due to ongoing issues with depression. Pt reported that she has been suffering with depression for the past 21 years. Pt reported that she has nervous thoughts and anxiety all the time. Pt also shared that she was prescribed Latuda and tonight was her first night taking it. Pt reported that her body does not like the medication and described the side effects as racing thoughts, shivering, diarrhea and her nerves being everywhere. During admission assessment, pt rates her anxiety at 3-4/10 and depression at 0/10. Denies SI, HI, and AVH, contracts for safety. Pt's goals here are to balance her medications, learn coping mechanisms and learn how to "just be happy and be normal and not feel hopeless".    Attendees:  Patient:  03/21/2013 9:58 AM   Signature: Mervyn GayJ. Jonnalagadda, MD 03/21/2013 9:58 AM  Signature:   03/21/2013 9:58 AM  Signature:   Chrisandra Nettersana Green, RN  03/21/2013 9:58 AM  Signature:  Quintella ReichertBeverly Knight, RN 03/21/2013 9:58 AM  Signature:  03/21/2013 9:58 AM   Signature:  Juline PatchQuylle Zaniya Mcaulay, LCSW 03/21/2013 9:58 AM  Signature:  Reyes Ivanhelsea Horton, LCSW 03/21/2013 9:58 AM  Signature:  Leisa LenzValerie Enoch, Care Coordinator [facility] 03/21/2013 9:58 AM  Signature:  Aloha GellKrista Dopson, RN 03/21/2013 9:58 AM  Signature: 03/21/2013  9:58 AM  Signature:   Onnie BoerJennifer Clark, RN [facility] 03/21/2013  9:58 AM  Signature:   03/21/2013  9:58 AM    Scribe for Treatment Team:   Juline PatchQuylle Dalana Pfahler,  03/21/2013 9:58 AM

## 2013-03-22 MED ORDER — CLONIDINE HCL 0.1 MG PO TABS
0.1000 mg | ORAL_TABLET | Freq: Once | ORAL | Status: AC
  Administered 2013-03-22: 0.1 mg via ORAL
  Filled 2013-03-22 (×2): qty 1

## 2013-03-22 MED ORDER — DESVENLAFAXINE SUCCINATE ER 50 MG PO TB24
50.0000 mg | ORAL_TABLET | Freq: Every day | ORAL | Status: DC
  Administered 2013-03-23: 50 mg via ORAL

## 2013-03-22 MED ORDER — HYDROXYZINE HCL 25 MG PO TABS
25.0000 mg | ORAL_TABLET | Freq: Four times a day (QID) | ORAL | Status: DC | PRN
  Administered 2013-03-22: 25 mg via ORAL
  Filled 2013-03-22: qty 1

## 2013-03-22 MED ORDER — DESVENLAFAXINE SUCCINATE ER 100 MG PO TB24: 100.0000 mg | ORAL_TABLET | Freq: Every day | ORAL | Status: DC

## 2013-03-22 NOTE — Progress Notes (Signed)
The focus of this group is to educate the patient on the purpose and policies of crisis stabilization and provide a format to answer questions about their admission.  The group details unit policies and expectations of patients while admitted.  Patient attended 0900 nurse education orientation group this morning.  Patient actively participated, appropriate affect, alert, appropriate insight and engagement.  Today patient will work on 3 goals for discharge.  

## 2013-03-22 NOTE — Progress Notes (Signed)
Patient ID: Nancy Barnes, female   DOB: May 02, 1970, 43 y.o.   MRN: [id number] [facility] MD Progress Note  03/22/2013 2:45 PM Merion Caton  MRN:  [id number] Subjective:  Nancy Barnes is an 43 y.o. female presenting to [facility] due to ongoing issues with depression. Pt reported that she has been suffering with depression for the past 21 years. Pt reported that she has nervous thoughts and anxiety all the time. Pt also shared that she was prescribed Latuda and tonight was her first night taking it. Pt reported that her body does not like the medication and described the side effects as racing thoughts, shivering, diarrhea and her nerves being everywhere.   Patient was seen and chart reviewed. Patient has been doing well without significant difficulties on the unit. Patient has been actively participating in unit activities including groups and therapies. Patient rates anxiety at 2-3/10 and depression at 4/10. Patient has been compliant with her medication and reportedly has no adverse effects. Patient continued to feel like she needed high dose of her medication pristiq because she was taking the medication for long time, even though she does not have any significant symptoms of depression or anxiety at this timetime does not appear to be in distress. Patient reported a mild bipolar have always been depressed. Patient is having trouble letting the past go and moving forward. Patient denies recent onset ideation and contracts for safety while in the hospital.   Diagnosis:   DSM5:  Depressive Disorders:  Major Depressive Disorder - Severe (296.23) Total Time spent with patient: Greater than 25 minutes  Axis I: Generalized Anxiety Disorder and Major Depression, Recurrent severe Axis II: Deferred Axis III:  Past Medical History  Diagnosis Date  . Asthma   . Hypokalemia   . Anxiety   . Depression   . Seasonal allergies   . Environmental allergies     food/medication  . Pneumomediastinum    Axis IV: other  psychosocial or environmental problems and problems related to social environment Axis V: 41-50 serious symptoms  ADL's:  Intact  Sleep: Good  Appetite:  Good  Suicidal Ideation:  Denies Homicidal Ideation:  Denies AEB (as evidenced by):  Psychiatric Specialty Exam: Physical Exam  Review of Systems  Constitutional: Negative.   HENT: Negative.   Eyes: Negative.   Respiratory: Negative.   Cardiovascular: Negative.   Gastrointestinal: Negative.   Genitourinary: Negative.   Musculoskeletal: Negative.   Skin: Negative.   Neurological: Negative.   Endo/Heme/Allergies: Negative.     Blood pressure 139/91, pulse 91, temperature 98 F (36.7 C), temperature source Oral, resp. rate 18, height 5\' 5"  (1.651 m), weight 78.019 kg (172 lb).Body mass index is 28.62 kg/(m^2).  General Appearance: Casual  Eye Contact::  Good  Speech:  Clear and Coherent  Volume:  Normal  Mood:  Anxious  Affect:  Appropriate  Thought Process:  Coherent  Orientation:  Full (Time, Place, and Person)  Thought Content:  WDL  Suicidal Thoughts:  No  Homicidal Thoughts:  No  Memory:  Immediate;   Good Recent;   Good Remote;   Good  Judgement:  Fair  Insight:  Fair  Psychomotor Activity:  Normal  Concentration:  Good  Recall:  Good  Fund of Knowledge:Good  Language: Good  Akathisia:  NA  Handed:  Right  AIMS (if indicated):     Assets:  Communication Skills Desire for Improvement Resilience  Sleep:  Number of Hours: 6.75   Musculoskeletal: Strength & Muscle Tone: within normal limits Gait &  Station: normal Patient leans: N/A  Current Medications: Current Facility-Administered Medications  Medication Dose Route Frequency Provider Last Rate Last Dose  . acetaminophen (TYLENOL) tablet 650 mg  650 mg Oral Q6H PRN Beau Fanny, FNP   650 mg at 03/21/13 1438  . albuterol (PROVENTIL HFA;VENTOLIN HFA) 108 (90 BASE) MCG/ACT inhaler 1-2 puff  1-2 puff Inhalation Q4H Beau Fanny, FNP   2 puff at  03/22/13 1156  . albuterol (PROVENTIL) (2.5 MG/3ML) 0.083% nebulizer solution 2.5 mg  2.5 mg Nebulization Q6H PRN Nehemiah Settle, MD       And  . ipratropium (ATROVENT) nebulizer solution 0.5 mg  0.5 mg Nebulization Q6H PRN Nehemiah Settle, MD      . alum & mag hydroxide-simeth (MAALOX/MYLANTA) 200-200-20 MG/5ML suspension 30 mL  30 mL Oral Q4H PRN Beau Fanny, FNP      . [START ON 03/23/2013] desvenlafaxine (PRISTIQ) 24 hr tablet 50 mg  50 mg Oral Daily Nehemiah Settle, MD      . hydrOXYzine (ATARAX/VISTARIL) tablet 25 mg  25 mg Oral QHS PRN Beau Fanny, FNP   25 mg at 03/20/13 2002  . magnesium hydroxide (MILK OF MAGNESIA) suspension 30 mL  30 mL Oral Daily PRN Beau Fanny, FNP      . pantoprazole (PROTONIX) EC tablet 80 mg  80 mg Oral Daily Beau Fanny, FNP   80 mg at 03/22/13 0758  . traZODone (DESYREL) tablet 50 mg  50 mg Oral QHS PRN Beau Fanny, FNP        Lab Results: No results found for this or any previous visit (from the past 48 hour(s)).  Physical Findings: AIMS: Facial and Oral Movements Muscles of Facial Expression: None, normal Lips and Perioral Area: None, normal Jaw: None, normal Tongue: None, normal,Extremity Movements Upper (arms, wrists, hands, fingers): None, normal Lower (legs, knees, ankles, toes): None, normal, Trunk Movements Neck, shoulders, hips: None, normal, Overall Severity Severity of abnormal movements (highest score from questions above): None, normal Incapacitation due to abnormal movements: None, normal Patient's awareness of abnormal movements (rate only patient's report): No Awareness, Dental Status Current problems with teeth and/or dentures?: No Does patient usually wear dentures?: No  CIWA:  CIWA-Ar Total: 1 COWS:  COWS Total Score: 2  Treatment Plan Summary: Daily contact with patient to assess and evaluate symptoms and progress in treatment Medication management  Plan: Review of chart, vital  signs, medications, and notes.  1-Individual and group therapy  2-Medication management for depression and anxiety: Medications reviewed with the patient and she stated no untoward effects, unchanged. Continue pristiq 50 mg daily for depression and trazodone 50 mg at bedtime for insomnia and a hydroxyzine 25 mg for anxiety  3-Coping skills for depression, anxiety  4-Continue crisis stabilization and management  5-Address health issues--monitoring vital signs, stable  6-Treatment plan in progress to prevent relapse of depression and anxiety 7. Disposition plans are in progress the patient may be discharged she continued to contract for safety and show clinical improvement and this will be discussed in the morning.  Medical Decision Making Problem Points:  Established problem, stable/improving (1), Review of last therapy session (1) and Review of psycho-social stressors (1) Data Points:  Review or order clinical lab tests (1) Review or order medicine tests (1) Review of medication regiment & side effects (2) Review of new medications or change in dosage (2)  I certify that inpatient services furnished can reasonably be expected to improve the  patient's condition.    Raunak Antuna,JANARDHAHA R. 03/22/2013 2:45 PM

## 2013-03-22 NOTE — Progress Notes (Addendum)
D:  Patient's self inventory sheet, patient sleeps well, good appetite, normal energy level, good attention span.  Rated depression and hopeless 1, zero anxiety.  Denied withdrawals.  Denied SI.  Denied physical problems.  "Drop things that are pressuring, relax, find support groups.  Thank you for all you do.  You have very difficult, very unappreciated jobs.  You guys are awesome."  No discharge plans.  No problems taking meds after discharge. A:  Medications administered per MD orders.  Emotional support and encouragement given patient. R:  Denied SI and HI.  Denied A/V hallucinations.  Will continue to monitor patient for safety with 15 minute checks.  Safety maintained.  1800  Patient would like another anxiety medication.  Feels she needs more medication to help her because she is worried about discharge tomorrow and how she is going to cope.  Will discuss medications with MD.  Nurse talked with patient about coping skills, deep breathing, etc. Patient and nurse discussed coping skills to control her stress about discharge tomorrow.

## 2013-03-22 NOTE — [facility] Group Notes (Signed)
[facility] LCSW Group Therapy      Feelings About Diagnosis 1:15 - 2:30 PM         03/22/2013  3:16 PM    Type of Therapy:  Group Therapy  Participation Level:  Active  Participation Quality:  Appropriate  Affect:  Appropriate  Cognitive:  Alert and Appropriate  Insight:  Developing/Improving and Engaged  Engagement in Therapy:  Developing/Improving and Engaged  Modes of Intervention:  Discussion, Education, Exploration, Problem-Solving, Rapport Building, Support  Summary of Progress/Problems:  Patient actively participated in group. Patient discussed past and present diagnosis and the effects it has had on  life.  Patient talked about family and society being judgmental and the stigma associated with having a mental health diagnosis.Patient shared she often feels embarrassed by having a mental health diagnosis.  She shared she would like to wake up in peace and not feel so overwhelmed with life.  [name], Nancy Barnes 03/22/2013  3:16 PM

## 2013-03-22 NOTE — Progress Notes (Signed)
Recreation Therapy Notes  Animal-Assisted Activity/Therapy (AAA/T) Program Checklist/Progress Notes Patient Eligibility Criteria Checklist & Daily Group note for Rec Tx Intervention  Date: 03.10.2015 Time: 2:45pm Location: [address] Dayroom    AAA/T Program Assumption of Risk Form signed by Patient/ or Parent Legal Guardian yes  Patient is free of allergies or sever asthma yes  Patient reports no fear of animals yes  Patient reports no history of cruelty to animals yes   Patient understands his/her participation is voluntary yes  Patient washes hands before animal contact yes  Patient washes hands after animal contact yes  Behavioral Response: Engaged, Appropriate   Education: Hand Washing, Appropriate Animal Interaction   Education Outcome: Acknowledges understanding   Clinical Observations/Feedback: Patient interacted appropriately with dog team and group members.    Marykay Lexenise L Keoki Mchargue, LRT/CTRS  Earnie Bechard L 03/22/2013 4:11 PM

## 2013-03-22 NOTE — Progress Notes (Signed)
Adult Psychoeducational Group Note  Date:  03/22/2013 Time:  1:44 AM  Group Topic/Focus:  Wrap-Up Group:   The focus of this group is to help patients review their daily goal of treatment and discuss progress on daily workbooks.  Participation Level:  Active  Participation Quality:  Appropriate  Affect:  Appropriate  Cognitive:  Appropriate  Insight: Appropriate  Engagement in Group:  Engaged  Modes of Intervention:  Discussion  Additional Comments:  Patient says that her day started off bad but got better. Pt says that she got to see her kids today and realized that they are reason to breath. Patient says for her wellness she likes to knit and wants to start back walking.  Percell LocusJOHNSON,TAWANA 03/22/2013, 1:44 AM

## 2013-03-22 NOTE — [facility] Suicide Risk Assessment (Signed)
[facility] INPATIENT:  Family/Significant Other Suicide Prevention Education  Suicide Prevention Education:  Education Completed; Nancy BraceLinda Schweer, Mother, [phone]; has been identified by the patient as the family member/significant other with whom the patient will be residing, and identified as the person(s) who will aid the patient in the event of a mental health crisis (suicidal ideations/suicide attempt).  With written consent from the patient, the family member/significant other has been provided the following suicide prevention education, prior to the and/or following the discharge of the patient.  The suicide prevention education provided includes the following:  Suicide risk factors  Suicide prevention and interventions  National Suicide Hotline telephone number  [facility] assessment telephone number  [facility] Assistance 911  [facility] and/or [facility]idential Mobile Crisis [facility] telephone number  Request made of family/significant other to:  Remove weapons (e.g., guns, rifles, knives), all items previously/currently identified as safety concern.  Mother advised patient does not have access to weapons.   Remove drugs/medications (over-the-counter, prescriptions, illicit drugs), all items previously/currently identified as a safety concern.  The family member/significant other verbalizes understanding of the suicide prevention education information provided.  The family member/significant other agrees to remove the items of safety concern listed above.  Nancy Barnes 03/22/2013, 3:10 PM

## 2013-03-22 NOTE — Progress Notes (Addendum)
D: Patient in her room in bed awake on approach.  Patient states she was relaxing and states she was given vistaril to help her anxiety.  Patient states she was also given clonidine to help her high blood pressure.  Patient states her blood pressures have been elevated for the last few days.  Patient blood pressure rechecked tonight and it was 121/79.  Patient denies SI/HI and denies AVH. A: Staff to monitor Q 15 mins for safety.  Encouragement and support offered.  No scheduled medications administered per orders. Patient refused albuterol inhaler. R: Patient remains safe on the unit.  Patient did not attend group tonight.  Patient not visible on the unit tonight.  Patient calm and cooperative and had no complaints after speaking with Clinical research associatewriter.

## 2013-03-23 MED ORDER — HYDROXYZINE HCL 25 MG PO TABS: 25.0000 mg | ORAL_TABLET | Freq: Four times a day (QID) | ORAL | Status: DC | PRN

## 2013-03-23 MED ORDER — DESVENLAFAXINE SUCCINATE ER 50 MG PO TB24: 50.0000 mg | ORAL_TABLET | Freq: Every day | ORAL | Status: DC

## 2013-03-23 NOTE — Discharge Summary (Signed)
Physician Discharge Summary Note  Patient:  Nancy Barnes is an 43 y.o., female MRN:  [id number] DOB:  24-Sep-1970 Patient phone:  [phone] (home)  Patient address:   [address] [place] [address],  Total Time spent with patient: Greater than 30 minutes  Date of Admission:  03/19/2013 Date of Discharge: 03/23/2013  Reason for Admission:  MDD   Discharge Diagnoses: Active Problems:   Medication management   Psychiatric Specialty Exam: Physical Exam  ROS  Blood pressure 125/91, pulse 94, temperature 97.8 F (36.6 C), temperature source Oral, resp. rate 20, height 5\' 5"  (1.651 m), weight 78.019 kg (172 lb).Body mass index is 28.62 kg/(m^2).  General Appearance: Casual  Eye Contact::  Good  Speech:  Clear and Coherent  Volume:  Normal  Mood:  Anxious  Affect:  Appropriate  Thought Process:  Coherent  Orientation:  Full (Time, Place, and Person)  Thought Content:  WDL  Suicidal Thoughts:  No  Homicidal Thoughts:  No  Memory:  Immediate;   Good Recent;   Good Remote;   Good  Judgement:  Good  Insight:  Good  Psychomotor Activity:  Normal  Concentration:  Good  Recall:  Good  Fund of Knowledge:Good  Language: Good  Akathisia:  NA  Handed:  Right  AIMS (if indicated):     Assets:  Communication Skills Desire for Improvement Resilience  Sleep:  Number of Hours: 5.5     Musculoskeletal: Strength & Muscle Tone: within normal limits Gait & Station: normal Patient leans: N/A  DSM5:  Depressive Disorders:  Major Depressive Disorder - Severe (296.23)  Axis Diagnosis:   AXIS I:  Generalized Anxiety Disorder and Major Depression, Recurrent severe AXIS II:  Deferred AXIS III:   Past Medical History  Diagnosis Date  . Asthma   . Hypokalemia   . Anxiety   . Depression   . Seasonal allergies   . Environmental allergies     food/medication  . Pneumomediastinum    AXIS IV:  other psychosocial or environmental problems and problems related to social  environment AXIS V:  61-70 mild symptoms  Level of Care:  OP  Hospital Course:  Nancy Barnes is an 43 y.o. female presenting to [facility] due to ongoing issues with depression. Pt reported that she has been suffering with depression for the past 21 years. Pt reported that she has nervous thoughts and anxiety all the time. Pt also shared that she was prescribed Latuda and tonight was her first night taking it. Pt reported that her body does not like the medication and described the side effects as racing thoughts, shivering, diarrhea and her nerves being everywhere.   During Hospitalization: Medications managed, psychoeducation, group and individual therapy. Pt currently denies SI, HI, and Psychosis. At discharge, pt rates anxiety at 1/10 and depression at 0/10. Pt states that she does have a good supportive home environment, that her mother is a major support system for her, and will followup with outpatient treatment. Affirms agreement with medication regimen and discharge plan. Denies other physical and psychological concerns at time of discharge.   Consults:  None  Significant Diagnostic Studies:  None  Discharge Vitals:   Blood pressure 125/91, pulse 94, temperature 97.8 F (36.6 C), temperature source Oral, resp. rate 20, height 5\' 5"  (1.651 m), weight 78.019 kg (172 lb). Body mass index is 28.62 kg/(m^2). Lab Results:   No results found for this or any previous visit (from the past 72 hour(s)).  Physical Findings: AIMS: Facial and  Oral Movements Muscles of Facial Expression: None, normal Lips and Perioral Area: None, normal Jaw: None, normal Tongue: None, normal,Extremity Movements Upper (arms, wrists, hands, fingers): None, normal Lower (legs, knees, ankles, toes): None, normal, Trunk Movements Neck, shoulders, hips: None, normal, Overall Severity Severity of abnormal movements (highest score from questions above): None, normal Incapacitation due to abnormal movements: None,  normal Patient's awareness of abnormal movements (rate only patient's report): No Awareness, Dental Status Current problems with teeth and/or dentures?: No Does patient usually wear dentures?: No  CIWA:  CIWA-Ar Total: 1 COWS:  COWS Total Score: 2  Psychiatric Specialty Exam: See Psychiatric Specialty Exam and Suicide Risk Assessment completed by Attending Physician prior to discharge.  Discharge destination:  Home  Is patient on multiple antipsychotic therapies at discharge:  No   Has Patient had three or more failed trials of antipsychotic monotherapy by history:  No  Recommended Plan for Multiple Antipsychotic Therapies: NA   Future Appointments Provider Department Dept Phone   05/03/2013 9:15 AM Waymon Budgelinton D Young, MD [facility] [phone]       Medication List    STOP taking these medications       diazepam 5 MG tablet  Commonly known as:  VALIUM     LATUDA 20 MG Tabs  Generic drug:  Lurasidone HCl      TAKE these medications     Indication   desvenlafaxine 50 MG 24 hr tablet  Commonly known as:  PRISTIQ  Take 1 tablet (50 mg total) by mouth daily.   Indication:  mood stabilization     EPIPEN 2-PAK 0.3 mg/0.3 mL Soaj injection  Generic drug:  EPINEPHrine  Use as directed      Fluticasone-Salmeterol 250-50 MCG/DOSE Aepb  Commonly known as:  ADVAIR DISKUS  Inhale 1 puff into the lungs 2 (two) times daily.      hydrOXYzine 25 MG tablet  Commonly known as:  ATARAX/VISTARIL  Take 1 tablet (25 mg total) by mouth every 6 (six) hours as needed for anxiety.   Indication:  anxiety     ipratropium-albuterol 0.5-2.5 (3) MG/3ML Soln  Commonly known as:  DUONEB  Take 3 mLs by nebulization every 6 (six) hours as needed (shortness of breath/wheezing.).      omeprazole 20 MG capsule  Commonly known as:  PRILOSEC  Take 20 mg by mouth daily.      Potassium Gluconate 595 MG Caps  Take 1 capsule by mouth daily.      VENTOLIN HFA 108 (90 BASE) MCG/ACT inhaler   Generic drug:  albuterol  2 PUFFS EVERY 6 HOURS AS NEEDED FOR WHEEZING            Follow-up Information   Follow up with [facility] On 04[facility]/2013. (Monday, April 25, 2013 at 3:00.  Please arrive at 2:30 with completed registration form.)    Contact information:   [address] [place]   [id number]  [phone]        Follow up with [name] On 03/25/2013. (You are scheduled Ulice BoldCarson Sarvis at 2:15 Friday, March 25, 2013)    Contact information:   [address] [place], [place]   [id number]  [phone]      Follow-up recommendations:  Activity:  As tolerated Diet:  Heart healthy with low sodium.  Comments:   Take all medications as prescribed. Keep all follow-up appointments as scheduled.  Do not consume alcohol or use illegal drugs while on prescription medications.  Report any adverse effects from your medications to your primary care provider promptly.  In the event of recurrent symptoms or worsening symptoms, call 911, a crisis hotline, or go to the nearest emergency department for evaluation.   Total Discharge Time:  Greater than 30 minutes.  Signed: Beau Fanny, FNP-BC 03/23/2013, 12:17 PM  Patient was seen face to face for psychiatric evaluation, suicide risk assessment and case discussed with treatment team, physician extender and made disposition plan after completing treatment. Reviewed the information documented and agree with the treatment plan.  Debbrah Sampedro,JANARDHAHA R. 03/25/2013 6:40 PM
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
## 2013-03-23 NOTE — Progress Notes (Signed)
Pt was discharged home today.  She denied any S/I H/I or A/V hallucinations.    She was given f/u appointment, rx, hotline info booklet, and letter provided by the case manager.  She voiced understanding to all instructions provided.  She declined the need for smoking cessation materials.

## 2013-03-26 ENCOUNTER — Encounter (HOSPITAL_COMMUNITY): Payer: Self-pay | Admitting: Emergency Medicine

## 2013-03-26 ENCOUNTER — Emergency Department (HOSPITAL_COMMUNITY)
Admission: EM | Admit: 2013-03-26 | Discharge: 2013-03-26 | Disposition: A | Discharge status: Home / Self Care | Payer: 59 | Attending: Emergency Medicine | Admitting: Emergency Medicine

## 2013-03-26 DIAGNOSIS — R6889 Other general symptoms and signs: Secondary | ICD-10-CM | POA: Insufficient documentation

## 2013-03-26 DIAGNOSIS — R45851 Suicidal ideations: Secondary | ICD-10-CM | POA: Insufficient documentation

## 2013-03-26 DIAGNOSIS — R4584 Anhedonia: Secondary | ICD-10-CM

## 2013-03-26 DIAGNOSIS — F411 Generalized anxiety disorder: Secondary | ICD-10-CM | POA: Insufficient documentation

## 2013-03-26 DIAGNOSIS — J45909 Unspecified asthma, uncomplicated: Secondary | ICD-10-CM | POA: Insufficient documentation

## 2013-03-26 DIAGNOSIS — F329 Major depressive disorder, single episode, unspecified: Secondary | ICD-10-CM

## 2013-03-26 DIAGNOSIS — F3289 Other specified depressive episodes: Secondary | ICD-10-CM | POA: Insufficient documentation

## 2013-03-26 DIAGNOSIS — F419 Anxiety disorder, unspecified: Secondary | ICD-10-CM

## 2013-03-26 DIAGNOSIS — Z862 Personal history of diseases of the blood and blood-forming organs and certain disorders involving the immune mechanism: Secondary | ICD-10-CM | POA: Insufficient documentation

## 2013-03-26 DIAGNOSIS — F332 Major depressive disorder, recurrent severe without psychotic features: Secondary | ICD-10-CM

## 2013-03-26 DIAGNOSIS — Z880 Allergy status to penicillin: Secondary | ICD-10-CM | POA: Insufficient documentation

## 2013-03-26 DIAGNOSIS — Z8639 Personal history of other endocrine, nutritional and metabolic disease: Secondary | ICD-10-CM | POA: Insufficient documentation

## 2013-03-26 DIAGNOSIS — Z79899 Other long term (current) drug therapy: Secondary | ICD-10-CM | POA: Insufficient documentation

## 2013-03-26 DIAGNOSIS — R4589 Other symptoms and signs involving emotional state: Secondary | ICD-10-CM

## 2013-03-26 DIAGNOSIS — F32A Depression, unspecified: Secondary | ICD-10-CM | POA: Diagnosis present

## 2013-03-26 HISTORY — DX: Suicidal ideations: R45.851

## 2013-03-26 MED ORDER — LISINOPRIL 10 MG PO TABS
10.0000 mg | ORAL_TABLET | Freq: Once | ORAL | Status: AC
  Administered 2013-03-26: 10 mg via ORAL
  Filled 2013-03-26: qty 1

## 2013-03-26 MED ORDER — DESVENLAFAXINE SUCCINATE ER 50 MG PO TB24: 100.0000 mg | ORAL_TABLET | Freq: Every day | ORAL | Status: DC

## 2013-03-26 MED ORDER — CLONAZEPAM 1 MG PO TABS: 1.0000 mg | ORAL_TABLET | Freq: Two times a day (BID) | ORAL | Status: DC | PRN

## 2013-03-26 MED ORDER — CLONAZEPAM 0.5 MG PO TABS
1.0000 mg | ORAL_TABLET | Freq: Two times a day (BID) | ORAL | Status: DC | PRN
  Administered 2013-03-26: 0.5 mg via ORAL
  Filled 2013-03-26: qty 2

## 2013-03-26 NOTE — ED Provider Notes (Signed)
CSN: [id number]     Arrival date & time 03/26/13  1010 History   First MD Initiated Contact with Patient 03/26/13 1017     Chief Complaint  Patient presents with  . Depression  . Anxiety     (Consider location/radiation/quality/duration/timing/severity/associated sxs/prior Treatment) HPI  Nancy Barnes Is a 43 year old female who presents the emergency department with chief complaint of depression and anhedonia.  The patient was seen at any pain emergency department on 03/19/2013 with the same complaints.  She had increasing and worsening of her depression with passive suicidal ideation at that time after beginning JordanLatuda.  The patient was admitted to behavioral health for group therapy.  Her latuda was discontinued.  At discharge she appeared to have less anxiety and depression and was discharged to followup with her therapist and a provider at behavioral health outpatient clinic.  The patient states that she has had no changes in her medication however she continues to have deep internal sadness, feelings of emptying his and hopelessness, anhedonia.  The patient is tearful during our conversation.  The patient denies suicidal ideation, homicidal ideation, audiovisual hallucinations.  She is requesting change to her medication and she feels that her depression is continually plummeting. His past medical history of depression since the birth of her child 21 years ago, spontaneous pneumomediastinum.  She has no medical complaints at this time.  Past Medical History  Diagnosis Date  . Asthma   . Hypokalemia   . Anxiety   . Depression   . Seasonal allergies   . Environmental allergies     food/medication  . Pneumomediastinum   . Suicidal ideations    Past Surgical History  Procedure Laterality Date  . Tubal ligation    . Ablation    . Breast biopsy  04/2011   Family History  Problem Relation Age of Onset  . Heart attack Mother     Bypass x5  . Diabetes Mellitus II Father   .  Hypertension Father   . Allergies Mother   . Allergies Daughter   . Allergies Maternal Grandmother   . Atrial fibrillation Maternal Grandmother   . Stroke Maternal Grandmother    History  Substance Use Topics  . Smoking status: Never Smoker   . Smokeless tobacco: Not on file  . Alcohol Use: No   OB History   Grav Para Term Preterm Abortions TAB SAB Ect Mult Living                 Review of Systems  Ten systems reviewed and are negative for acute change, except as noted in the HPI.    Allergies  Nsaids; Shellfish allergy; Abilify; Amoxicillin; Fish allergy; Latuda; Levaquin; Other; Penicillins; Prednisone; Seroquel; Sulfa antibiotics; Ativan; and Xanax  Home Medications   Current Outpatient Rx  Name  Route  Sig  Dispense  Refill  . albuterol (PROVENTIL HFA;VENTOLIN HFA) 108 (90 BASE) MCG/ACT inhaler   Inhalation   Inhale 2 puffs into the lungs every 6 (six) hours as needed for wheezing or shortness of breath.         . cetirizine (ZYRTEC) 10 MG tablet   Oral   Take 10 mg by mouth daily.         Marland Kitchen. desvenlafaxine (PRISTIQ) 50 MG 24 hr tablet   Oral   Take 1 tablet (50 mg total) by mouth daily.   30 tablet   0   . hydrOXYzine (ATARAX/VISTARIL) 25 MG tablet   Oral   Take 1 tablet (  25 mg total) by mouth every 6 (six) hours as needed for anxiety.   14 tablet   0   . lisinopril (PRINIVIL,ZESTRIL) 10 MG tablet   Oral   Take 10 mg by mouth daily.         Marland Kitchen omeprazole (PRILOSEC) 20 MG capsule   Oral   Take 20 mg by mouth daily.         Marland Kitchen EPIPEN 2-PAK 0.3 MG/0.3ML SOAJ      Use as directed         . ipratropium-albuterol (DUONEB) 0.5-2.5 (3) MG/3ML SOLN   Nebulization   Take 3 mLs by nebulization every 6 (six) hours as needed (shortness of breath/wheezing.).          BP 145/99  Pulse 91  Temp(Src) 99.2 F (37.3 C) (Oral)  Resp 16  SpO2 98% Physical Exam Physical Exam  Nursing note and vitals reviewed. Constitutional: She is oriented to  person, place, and time. She appears well-developed and well-nourished. No distress.  Tearful. HENT:  Head: Normocephalic and atraumatic.  Eyes: Conjunctivae normal and EOM are normal. Pupils are equal, round, and reactive to light. No scleral icterus.  Neck: Normal range of motion.  Cardiovascular: Normal rate, regular rhythm and normal heart sounds.  Exam reveals no gallop and no friction rub.   No murmur heard. Pulmonary/Chest: Effort normal and breath sounds normal. No respiratory distress.  Abdominal: Soft. Bowel sounds are normal. She exhibits no distension and no mass. There is no tenderness. There is no guarding.  Neurological: She is alert and oriented to person, place, and time.  Skin: Skin is warm and dry. She is not diaphoretic.    ED Course  Procedures (including critical care time) Labs Review Labs Reviewed - No data to display Imaging Review No results found.   EKG Interpretation None      MDM   Final diagnoses:  Depression  Anhedonia  Alteration in meaningfulness as evidenced by hopelessness  Anxiety   Patient with need for medication change. I have spoken with Dr. Addison Naegeli who will see the patient in the ED. I feel pateint does not need inpatient treatment at this time.   2:04 PM Pateint seen and assessed by Psych. She will be discharged with med changes. Follow up at [facility] OP services. patient in agreement with plan of care.    Arthor Captain, PA-C 03/26/13 [phone]

## 2013-03-26 NOTE — [facility] Suicide Risk Assessment (Addendum)
Suicide Risk Assessment  Discharge Assessment     Demographic Factors:  Caucasian  Total Time spent with patient: 20 minutes  Psychiatric Specialty Exam:     Blood pressure 145/99, pulse 91, temperature 99.2 F (37.3 C), temperature source Oral, resp. rate 16, SpO2 98.00%.There is no weight on file to calculate BMI.  General Appearance: Casual  Eye Contact::  Fair  Speech:  Normal Rate  Volume:  Normal  Mood:  Depressed  Affect:  Tearful  Thought Process:  Coherent  Orientation:  Full (Time, Place, and Person)  Thought Content:  WDL  Suicidal Thoughts:  No  Homicidal Thoughts:  No  Memory:  Immediate;   Fair Recent;   Fair Remote;   Fair  Judgement:  Fair  Insight:  Good  Psychomotor Activity:  Normal  Concentration:  Fair  Recall:  Good  Fund of Knowledge:Good  Language: Good  Akathisia:  No  Handed:  Right  AIMS (if indicated):     Assets:  Leisure Time Physical Health Resilience Social Support  Sleep:       Musculoskeletal: Strength & Muscle Tone: within normal limits Gait & Station: normal Patient leans: N/A   Mental Status Per Nursing Assessment::   On Admission:   Patient has been crying frequently since discharge on the 11th, denies suicidal/homicidal ideations and hallucinations.  Good support system in place.  [facility] and she agrees.  Current Mental Status by Physician: NA  Loss Factors: NA  Historical Factors: NA  Risk Reduction Factors:   Sense of responsibility to family, Employed, Positive social support, Positive therapeutic relationship and Positive coping skills or problem solving skills  Continued Clinical Symptoms:  Depression:   Anhedonia  Cognitive Features That Contribute To Risk:  NA  Suicide Risk:  Minimal: No identifiable suicidal ideation.  Patients presenting with no risk factors but with morbid ruminations; may be classified as minimal risk based on the severity of the depressive symptoms  Discharge  Diagnoses:   AXIS I:  Anxiety Disorder NOS and Major Depression, Recurrent severe AXIS II:  Deferred AXIS III:   Past Medical History  Diagnosis Date  . Asthma   . Hypokalemia   . Anxiety   . Depression   . Seasonal allergies   . Environmental allergies     food/medication  . Pneumomediastinum   . Suicidal ideations    AXIS IV:  problems with access to health care services AXIS V:  61-70 mild symptoms  Plan Of Care/Follow-up recommendations:  Activity:  as tolerated Diet:  heart-healthy diet  Is patient on multiple antipsychotic therapies at discharge:  No   Has Patient had three or more failed trials of antipsychotic monotherapy by history:  No  Recommended Plan for Multiple Antipsychotic Therapies: NA    Elasia Furnish, PMH-NP 03/26/2013, 12:03 PM

## 2013-03-26 NOTE — Discharge Instructions (Signed)

## 2013-03-26 NOTE — Consult Note (Signed)
[facility] Face-to-Face Psychiatry Consult   Reason for Consult:  Depression Referring Physician:  ED MD  Nancy Barnes is an 43 y.o. female. Total Time spent with patient: 30 minutes  Assessment: AXIS I:  Anxiety Disorder NOS and Major Depression, Recurrent severe AXIS II:  Deferred AXIS III:   Past Medical History  Diagnosis Date  . Asthma   . Hypokalemia   . Anxiety   . Depression   . Seasonal allergies   . Environmental allergies     food/medication  . Pneumomediastinum   . Suicidal ideations    AXIS IV:  problems with access to health care services--psychiatrist not available AXIS V:  61-70 mild symptoms  Review of Systems  Constitutional: Negative.   HENT: Negative.   Eyes: Negative.   Respiratory: Negative.   Cardiovascular: Negative.   Gastrointestinal: Negative.   Genitourinary: Negative.   Musculoskeletal: Negative.   Skin: Negative.   Neurological: Negative.   Endo/Heme/Allergies: Negative.   Psychiatric/Behavioral: Positive for depression.   Plan:  No evidence of imminent risk to self or others at present.  Patient's medications adjusted and referred to [facility] IOP.  Dr. Elsie Saas assessed the patient and concurs with the treatment plan.  Subjective:   Nancy Barnes is a 43 y.o. female patient does not warrant admission.  HPI:  Patient was discharged on Wednesday and has had increased crying spells.  She feels her medications needed adjusting but her psychiatrist was not available.  Positive for anhedonia and decreased motivation.  She is accompanied by her mother.  Patient's medications were adjusted and Klonopin started with a positive effect.  She will call [facility] IOP to set up an appointment to the depression IOP.  Her mother and father are good supports who live near her and her 44 and 74 yo children live with her, good relationship.   HPI Elements:   Location:  generalized. Quality:  acute. Severity:  mild. Timing:  intermittent. Duration:  since  Wednesday. Context:  stressors.  Past Psychiatric History: Past Medical History  Diagnosis Date  . Asthma   . Hypokalemia   . Anxiety   . Depression   . Seasonal allergies   . Environmental allergies     food/medication  . Pneumomediastinum   . Suicidal ideations     reports that she has never smoked. She does not have any smokeless tobacco history on file. She reports that she does not drink alcohol or use illicit drugs. Family History  Problem Relation Age of Onset  . Heart attack Mother     Bypass x5  . Diabetes Mellitus II Father   . Hypertension Father   . Allergies Mother   . Allergies Daughter   . Allergies Maternal Grandmother   . Atrial fibrillation Maternal Grandmother   . Stroke Maternal Grandmother            Allergies:   Allergies  Allergen Reactions  . Nsaids Shortness Of Breath  . Shellfish Allergy Anaphylaxis  . Abilify [Aripiprazole] Other (See Comments)    hallucinations  . Amoxicillin   . Fish Allergy Other (See Comments)    Reaction unknown  . Latuda [Lurasidone Hcl] Other (See Comments)    Uncontrollable shaking, hopelessness.  Barbera Setters [Levofloxacin In D5w] Other (See Comments)    Reaction unknown  . Other Other (See Comments)    Reaction unknown to nuts  . Penicillins Other (See Comments)    Reaction unknown  . Prednisone Other (See Comments)    Pruritus/ hives  .  Seroquel [Quetiapine Fumarate] Other (See Comments)    Patient states it made her not care about the world and she just sat down and didn't get up.  . Sulfa Antibiotics Other (See Comments)    Reaction unknown  . Ativan [Lorazepam] Anxiety    Shakiness  . Xanax [Alprazolam] Anxiety    Makes pt. anxious    ACT Assessment Complete:  Yes:    Educational Status    Risk to Self: Risk to self Is patient at risk for suicide?: No, but patient needs Medical Clearance Substance abuse history and/or treatment for substance abuse?: No  Risk to Others:    Abuse:    Prior  Inpatient Therapy:    Prior Outpatient Therapy:    Additional Information:                    Objective: Blood pressure 138/95, pulse 87, temperature 99.2 F (37.3 C), temperature source Oral, resp. rate 18, SpO2 98.00%.There is no weight on file to calculate BMI.No results found for this or any previous visit (from the past 72 hour(s)). Labs are reviewed and are pertinent for no medical issues noted.  Current Facility-Administered Medications  Medication Dose Route Frequency Provider Last Rate Last Dose  . clonazePAM (KLONOPIN) tablet 1 mg  1 mg Oral BID PRN Nanine MeansJamison Lord, NP   0.5 mg at 03/26/13 1223   Current Outpatient Prescriptions  Medication Sig Dispense Refill  . albuterol (PROVENTIL HFA;VENTOLIN HFA) 108 (90 BASE) MCG/ACT inhaler Inhale 2 puffs into the lungs every 6 (six) hours as needed for wheezing or shortness of breath.      . hydrOXYzine (ATARAX/VISTARIL) 25 MG tablet Take 1 tablet (25 mg total) by mouth every 6 (six) hours as needed for anxiety.  14 tablet  0  . cetirizine (ZYRTEC) 10 MG tablet Take 1 tablet (10 mg total) by mouth daily.      . clonazePAM (KLONOPIN) 1 MG tablet Take 1 tablet (1 mg total) by mouth 2 (two) times daily as needed (anxiety).  30 tablet  0  . desvenlafaxine (PRISTIQ) 50 MG 24 hr tablet Take 2 tablets (100 mg total) by mouth daily.  60 tablet  0  . EPINEPHrine (EPIPEN 2-PAK) 0.3 mg/0.3 mL SOAJ injection Use as directed  1 Device    . ipratropium-albuterol (DUONEB) 0.5-2.5 (3) MG/3ML SOLN Take 3 mLs by nebulization every 6 (six) hours as needed (shortness of breath/wheezing.).      Marland Kitchen. lisinopril (PRINIVIL,ZESTRIL) 10 MG tablet Take 1 tablet (10 mg total) by mouth daily.  30 tablet  0  . omeprazole (PRILOSEC) 20 MG capsule Take 1 capsule (20 mg total) by mouth daily.        Psychiatric Specialty Exam:     Blood pressure 138/95, pulse 87, temperature 99.2 F (37.3 C), temperature source Oral, resp. rate 18, SpO2 98.00%.There is no  weight on file to calculate BMI.  General Appearance: Casual  Eye Contact::  Fair  Speech:  Normal Rate  Volume:  Normal  Mood:  Depressed  Affect:  Congruent  Thought Process:  Coherent  Orientation:  Full (Time, Place, and Person)  Thought Content:  WDL  Suicidal Thoughts:  No  Homicidal Thoughts:  No  Memory:  Immediate;   Fair Recent;   Fair Remote;   Fair  Judgement:  Fair  Insight:  Good  Psychomotor Activity:  Normal  Concentration:  Fair  Recall:  Fair  Fund of Knowledge:Good  Language: Good  Akathisia:  No  Handed:  Right  AIMS (if indicated):     Assets:  Leisure Time Physical Health Resilience Social Support  Sleep:      Musculoskeletal: Strength & Muscle Tone: within normal limits Gait & Station: normal Patient leans: N/A  Treatment Plan Summary: Patient does not meet criteria for acute psychiatric hospitalization as she has no safety concerns. Medication adjustment was completed and provided followup care. Pristiq increased from 50 mg daily to 100 mg for depression, Klonopin 1 mg BID for anxiety started.  Patient will follow-up with [facility] IOP on Monday.  Nanine Means, PMH-NP 03/26/2013 1:50 PM  Patient was seen face-to-face for psychiatric evaluation, case discussed with her physician extender and formulated treatment plan.Reviewed the information documented and agree with the treatment plan.   Deloras Reichard,JANARDHAHA R. 03/26/2013 7:19 PM

## 2013-03-26 NOTE — ED Notes (Signed)
She remains in no distress.  We have been awaiting the delivery of pt's. D/c papers by the psych. N.P., which h ave just arrived.

## 2013-03-26 NOTE — ED Notes (Signed)
She cites persistent depression made worse by thr prescribing of Latuda.  She has ceased JordanLatuda; and had looked forward to her medications being evaluated and "changed" while at a [facility] "but they kept me on the same meds I was on".  She is here with her mom.  She is a bit tearful while I speak with her.

## 2013-03-27 NOTE — ED Provider Notes (Signed)
Medical screening examination/treatment/procedure(s) were performed by non-physician practitioner and as supervising physician I was immediately available for consultation/collaboration.   EKG Interpretation None        Ellington Greenslade Y. OlettGavin Pounda LamasGhim, MD 03/27/13 [id number]

## 2013-03-28 NOTE — Progress Notes (Signed)
Patient Discharge Instructions:  After Visit Summary (AVS):   Faxed to:  03/28/13 Discharge Summary Note:   Faxed to:  03/28/13 Psychiatric Admission Assessment Note:   Faxed to:  03/28/13 Suicide Risk Assessment - Discharge Assessment:   Faxed to:  03/28/13 Faxed/Sent to the Next Level Care provider:  03/28/13 Next Level Care Provider Has Access to the EMR, 03/28/13 Records provided to [facility] via [facility]/Epic access. Faxed to [facility] @ [phone]  Jerelene ReddenSheena E [place], 03/28/2013, 3:46 PM

## 2013-03-29 ENCOUNTER — Other Ambulatory Visit (HOSPITAL_COMMUNITY): Payer: 59 | Attending: Psychiatry | Admitting: Psychiatry

## 2013-03-29 ENCOUNTER — Encounter (HOSPITAL_COMMUNITY): Payer: Self-pay

## 2013-03-29 DIAGNOSIS — F411 Generalized anxiety disorder: Secondary | ICD-10-CM | POA: Insufficient documentation

## 2013-03-29 DIAGNOSIS — G4700 Insomnia, unspecified: Secondary | ICD-10-CM | POA: Insufficient documentation

## 2013-03-29 DIAGNOSIS — J45909 Unspecified asthma, uncomplicated: Secondary | ICD-10-CM | POA: Insufficient documentation

## 2013-03-29 DIAGNOSIS — F332 Major depressive disorder, recurrent severe without psychotic features: Secondary | ICD-10-CM | POA: Insufficient documentation

## 2013-03-29 DIAGNOSIS — F331 Major depressive disorder, recurrent, moderate: Secondary | ICD-10-CM
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
## 2013-03-29 NOTE — Progress Notes (Signed)
Patient ID: Nancy Barnes, female   DOB: 1970/08/02, 43 y.o.   MRN: [id number] D:  Pt started in [facility] today.  Length of stay is two-three weeks.  It is recommended that pt be out of work until 04-25-13; rtw on 04-26-13; without any restrictions.  R:  Pt receptive.

## 2013-03-30 ENCOUNTER — Other Ambulatory Visit (HOSPITAL_COMMUNITY): Payer: 59 | Attending: Psychiatry | Admitting: Psychiatry

## 2013-03-30 DIAGNOSIS — F32A Depression, unspecified: Secondary | ICD-10-CM

## 2013-03-30 DIAGNOSIS — F411 Generalized anxiety disorder: Secondary | ICD-10-CM | POA: Insufficient documentation

## 2013-03-30 DIAGNOSIS — F329 Major depressive disorder, single episode, unspecified: Secondary | ICD-10-CM

## 2013-03-30 DIAGNOSIS — J45909 Unspecified asthma, uncomplicated: Secondary | ICD-10-CM | POA: Insufficient documentation

## 2013-03-30 DIAGNOSIS — I10 Essential (primary) hypertension: Secondary | ICD-10-CM | POA: Insufficient documentation

## 2013-03-30 DIAGNOSIS — Z881 Allergy status to other antibiotic agents status: Secondary | ICD-10-CM | POA: Insufficient documentation

## 2013-03-30 DIAGNOSIS — R51 Headache: Secondary | ICD-10-CM | POA: Insufficient documentation

## 2013-03-30 DIAGNOSIS — Z882 Allergy status to sulfonamides status: Secondary | ICD-10-CM | POA: Insufficient documentation

## 2013-03-30 DIAGNOSIS — Z91013 Allergy to seafood: Secondary | ICD-10-CM | POA: Insufficient documentation

## 2013-03-30 DIAGNOSIS — Z888 Allergy status to other drugs, medicaments and biological substances status: Secondary | ICD-10-CM | POA: Insufficient documentation

## 2013-03-30 DIAGNOSIS — F331 Major depressive disorder, recurrent, moderate: Secondary | ICD-10-CM | POA: Insufficient documentation

## 2013-03-30 DIAGNOSIS — G4700 Insomnia, unspecified: Secondary | ICD-10-CM | POA: Insufficient documentation

## 2013-03-30 DIAGNOSIS — Z880 Allergy status to penicillin: Secondary | ICD-10-CM | POA: Insufficient documentation
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
## 2013-03-30 NOTE — Progress Notes (Signed)
    Daily Group Progress Note  Program: IOP  Group Time: 9:00-10:30  Participation Level: Active  Behavioral Response: Appropriate  Type of Therapy:  Group Therapy  Summary of Progress: Pt. Participated in heartmath and reflective meditation. Pt. Shared guilt related to her children having mother with mental illness and not being able to meet emotional needs of family.      Group Time: 10:30-12:00  Participation Level:  Active  Behavioral Response: Appropriate  Type of Therapy: Psycho-education Group  Summary of Progress: Pt. Participated in group on developing healthy relationship boundaries.  Shaune PollackBrown, Jennifer B, COUNS

## 2013-03-30 NOTE — Progress Notes (Signed)
    Daily Group Progress Note  Program: IOP  Group Time: 9:00-10:30  Participation Level: Active  Behavioral Response: Appropriate  Type of Therapy:  Group Therapy  Summary of Progress: Pt. Met with physician and case manager.     Group Time: 10:30-12:00  Participation Level:  Active  Behavioral Response: Appropriate  Type of Therapy: Psycho-education Group  Summary of Progress: Pt. Participated in discussion about RAIN method of meditation.  [name], COUNS

## 2013-03-31 ENCOUNTER — Encounter (HOSPITAL_COMMUNITY): Payer: Self-pay | Admitting: Psychiatry

## 2013-03-31 ENCOUNTER — Other Ambulatory Visit (HOSPITAL_COMMUNITY): Payer: 59 | Admitting: Psychiatry

## 2013-04-01 ENCOUNTER — Encounter (HOSPITAL_COMMUNITY): Payer: Self-pay | Admitting: Psychiatry

## 2013-04-01 ENCOUNTER — Other Ambulatory Visit (HOSPITAL_COMMUNITY): Payer: 59 | Admitting: Psychiatry

## 2013-04-01 DIAGNOSIS — F329 Major depressive disorder, single episode, unspecified: Secondary | ICD-10-CM

## 2013-04-01 DIAGNOSIS — F32A Depression, unspecified: Secondary | ICD-10-CM

## 2013-04-01 NOTE — Progress Notes (Signed)
    Daily Group Progress Note  Program: IOP  Group Time: 9:00-10:30  Participation Level: Active  Behavioral Response: Appropriate  Type of Therapy:  Group Therapy  Summary of Progress: Pt. Participated in heartmath and guided visualization. Pt. Shared challenge of developing healthy relationship boundaries.     Group Time: 10:30-12:00  Participation Level:  Active  Behavioral Response: Appropriate  Type of Therapy: Psycho-education Group  Summary of Progress: Pt. Participated in discussion about how to develop healthy relationship boundaries.  Shaune PollackBrown, Jennifer B, COUNS

## 2013-04-01 NOTE — Progress Notes (Signed)
    Daily Group Progress Note  Program: IOP  Group Time: 9:00-10:30  Participation Level: Active  Behavioral Response: Appropriate  Type of Therapy:  Group Therapy  Summary of Progress: Pt. Participated in discussion about developing healthy relationship boundaries and identifying unhealthy relationship behaviors.     Group Time: 10:30-12:00  Participation Level:  Active  Behavioral Response: Appropriate  Type of Therapy: Psycho-education Group  Summary of Progress: Pt. Participated in intention building exercise for the weekend and in discussion about cultivating joy and pleasure in our lives.  Shaune PollackBrown, Andre Swander B, COUNS

## 2013-04-02 ENCOUNTER — Encounter (HOSPITAL_COMMUNITY): Payer: Self-pay | Admitting: Psychiatry

## 2013-04-04 ENCOUNTER — Other Ambulatory Visit (HOSPITAL_COMMUNITY): Payer: 59 | Admitting: Psychiatry

## 2013-04-04 ENCOUNTER — Encounter (HOSPITAL_COMMUNITY): Payer: Self-pay | Admitting: Psychiatry

## 2013-04-04 DIAGNOSIS — F329 Major depressive disorder, single episode, unspecified: Secondary | ICD-10-CM

## 2013-04-04 DIAGNOSIS — F32A Depression, unspecified: Secondary | ICD-10-CM
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
## 2013-04-05 ENCOUNTER — Encounter (HOSPITAL_COMMUNITY): Payer: Self-pay | Admitting: Psychiatry

## 2013-04-05 ENCOUNTER — Other Ambulatory Visit (HOSPITAL_COMMUNITY): Payer: 59 | Admitting: Psychiatry

## 2013-04-05 DIAGNOSIS — F32A Depression, unspecified: Secondary | ICD-10-CM

## 2013-04-05 DIAGNOSIS — F329 Major depressive disorder, single episode, unspecified: Secondary | ICD-10-CM
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
## 2013-04-06 ENCOUNTER — Other Ambulatory Visit (HOSPITAL_COMMUNITY): Payer: 59 | Admitting: Psychiatry

## 2013-04-06 ENCOUNTER — Encounter (HOSPITAL_COMMUNITY): Payer: Self-pay | Admitting: Psychiatry

## 2013-04-06 DIAGNOSIS — F329 Major depressive disorder, single episode, unspecified: Secondary | ICD-10-CM

## 2013-04-06 DIAGNOSIS — F32A Depression, unspecified: Secondary | ICD-10-CM
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
## 2013-04-06 NOTE — Progress Notes (Signed)
Patient ID: Nancy Barnes, female   DOB: 1970-08-16, 43 y.o.   MRN: [id number] Patient and the chart was reviewed and case was discussed with the case manager Ms. Jeri Modenaita Clark. He should is a 43 year old divorced white female who was discharged from the inpatient unit on 03/23/2013. Her depression worsened on 314 and she was seen by Dr. Elsie SaasJonnalagadda who started her on Klonopin 1 mg twice a day which the patient stated made her extremely drowsy so she  discontinued. He also referred her to IOP. Patient was on Pristiq at bedtime and this loss increased Prisstic 100 mg. Subsequently she started IOP and needle M[place]burn discontinued the Pristiq and started her on Effexor XR 75 mg every day. Patient states that she is experiencing a headache by lunchtime after she takes the Effexor in the morning. Discussed switching to Effexor to evening and she stated understanding. Patient will see me in 2 days and I discussed increasing the Effexor XR to 150 mg every day she stated understanding.  Patient reports that her sleep is good appetite tends to fluctuate as does her mood. She is anxious about her schoolwork. Denies suicidal or homicidal ideation and has no hallucinations or delusions.

## 2013-04-07 ENCOUNTER — Other Ambulatory Visit (HOSPITAL_COMMUNITY): Payer: 59 | Admitting: Psychiatry

## 2013-04-07 ENCOUNTER — Encounter (HOSPITAL_COMMUNITY): Payer: Self-pay | Admitting: Psychiatry

## 2013-04-07 DIAGNOSIS — F329 Major depressive disorder, single episode, unspecified: Secondary | ICD-10-CM

## 2013-04-07 DIAGNOSIS — F32A Depression, unspecified: Secondary | ICD-10-CM
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
## 2013-04-08 ENCOUNTER — Other Ambulatory Visit (HOSPITAL_COMMUNITY): Payer: 59 | Admitting: Psychiatry

## 2013-04-08 ENCOUNTER — Encounter (HOSPITAL_COMMUNITY): Payer: Self-pay | Admitting: Psychiatry

## 2013-04-08 DIAGNOSIS — F329 Major depressive disorder, single episode, unspecified: Secondary | ICD-10-CM

## 2013-04-08 DIAGNOSIS — F32A Depression, unspecified: Secondary | ICD-10-CM
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
## 2013-04-11 ENCOUNTER — Other Ambulatory Visit (HOSPITAL_COMMUNITY): Payer: 59 | Admitting: Psychiatry

## 2013-04-11 ENCOUNTER — Encounter (HOSPITAL_COMMUNITY): Payer: Self-pay | Admitting: Psychiatry

## 2013-04-11 DIAGNOSIS — F329 Major depressive disorder, single episode, unspecified: Secondary | ICD-10-CM

## 2013-04-11 DIAGNOSIS — F32A Depression, unspecified: Secondary | ICD-10-CM

## 2013-04-11 MED ORDER — VENLAFAXINE HCL ER 150 MG PO CP24: 75.0000 mg | ORAL_CAPSULE | Freq: Every day | ORAL | Status: DC
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
## 2013-04-12 ENCOUNTER — Other Ambulatory Visit (HOSPITAL_COMMUNITY): Payer: 59 | Admitting: Psychiatry

## 2013-04-12 ENCOUNTER — Encounter (HOSPITAL_COMMUNITY): Payer: Self-pay | Admitting: Psychiatry

## 2013-04-12 DIAGNOSIS — F32A Depression, unspecified: Secondary | ICD-10-CM

## 2013-04-12 DIAGNOSIS — F329 Major depressive disorder, single episode, unspecified: Secondary | ICD-10-CM

## 2013-04-12 MED ORDER — HYDROXYZINE HCL 25 MG PO TABS: 25.0000 mg | ORAL_TABLET | Freq: Four times a day (QID) | ORAL | Status: DC | PRN

## 2013-04-12 NOTE — Progress Notes (Signed)
    Daily Group Progress Note  Program: IOP  Group Time: 9:00-10:30  Participation Level: Active  Behavioral Response: Appropriate  Type of Therapy:  Group Therapy  Summary of Progress: Pt. Participated in morning meditation and body scan. Pt. Shared fears about adult children leaving the home, developing new relationships with her children as they enter new stage in their lives.     Group Time: 10:30-12:00  Participation Level:  Active  Behavioral Response: Appropriate  Type of Therapy: Psycho-education Group  Summary of Progress: Pt. Participated in discussion about developing self-compassion using Kristin Neff's model of self-compassion.  Shaune PollackBrown, Clarisa Danser B, COUNS

## 2013-04-13 ENCOUNTER — Encounter (HOSPITAL_COMMUNITY): Payer: Self-pay | Admitting: Psychiatry

## 2013-04-13 ENCOUNTER — Other Ambulatory Visit (HOSPITAL_COMMUNITY): Payer: 59 | Attending: Psychiatry | Admitting: Psychiatry

## 2013-04-13 DIAGNOSIS — F329 Major depressive disorder, single episode, unspecified: Secondary | ICD-10-CM

## 2013-04-13 DIAGNOSIS — Z881 Allergy status to other antibiotic agents status: Secondary | ICD-10-CM | POA: Insufficient documentation

## 2013-04-13 DIAGNOSIS — F332 Major depressive disorder, recurrent severe without psychotic features: Secondary | ICD-10-CM | POA: Insufficient documentation

## 2013-04-13 DIAGNOSIS — Z880 Allergy status to penicillin: Secondary | ICD-10-CM | POA: Insufficient documentation

## 2013-04-13 DIAGNOSIS — F32A Depression, unspecified: Secondary | ICD-10-CM

## 2013-04-13 DIAGNOSIS — J45909 Unspecified asthma, uncomplicated: Secondary | ICD-10-CM | POA: Insufficient documentation

## 2013-04-13 DIAGNOSIS — I10 Essential (primary) hypertension: Secondary | ICD-10-CM | POA: Insufficient documentation

## 2013-04-13 DIAGNOSIS — Z91013 Allergy to seafood: Secondary | ICD-10-CM | POA: Insufficient documentation

## 2013-04-13 DIAGNOSIS — Z882 Allergy status to sulfonamides status: Secondary | ICD-10-CM | POA: Insufficient documentation

## 2013-04-13 DIAGNOSIS — R51 Headache: Secondary | ICD-10-CM | POA: Insufficient documentation

## 2013-04-13 DIAGNOSIS — Z888 Allergy status to other drugs, medicaments and biological substances status: Secondary | ICD-10-CM | POA: Insufficient documentation

## 2013-04-13 DIAGNOSIS — G4700 Insomnia, unspecified: Secondary | ICD-10-CM | POA: Insufficient documentation

## 2013-04-13 DIAGNOSIS — F411 Generalized anxiety disorder: Secondary | ICD-10-CM | POA: Insufficient documentation

## 2013-04-13 NOTE — Progress Notes (Signed)
    Daily Group Progress Note  Program: IOP  Group Time: 9:00-10:30  Participation Level: Active  Behavioral Response: Appropriate  Type of Therapy:  Group Therapy  Summary of Progress: Pt. Continues to show brightened affect, talks and laughs appropriately; occasional tearfulness usually related to developing awareness of childhood wounds. Pt. Participated in discussion about the role of nutrition in the treatment of depression.     Group Time: 10:30-12:00  Participation Level:  Active  Behavioral Response: Appropriate  Type of Therapy: Psycho-education Group  Summary of Progress: Pt. Participated in discussion about "The Art of Being Yourself" by Genia Delaroline McHugh.  Shaune PollackBrown, Jennifer B, COUNS

## 2013-04-14 ENCOUNTER — Other Ambulatory Visit (HOSPITAL_COMMUNITY): Payer: 59 | Admitting: Psychiatry

## 2013-04-14 NOTE — Progress Notes (Unsigned)
    Daily Group Progress Note  Program: IOP  Group Time: 9:00-10:30  Participation Level: Active  Behavioral Response: Appropriate  Type of Therapy:  Group Therapy  Summary of Progress: Pt. Participated in morning meditation. Pt. Processed feelings related to it being her last day in group. Pt. Shared that she had learned about how to develop her self-worth, how to communicate to her family about her depression, and how to set better boundaries in her relationships.     Group Time: 10:30-12:00  Participation Level:  Active  Behavioral Response: Appropriate  Type of Therapy: Psycho-education Group  Summary of Progress: Pt. Participated in discussion about how to develop acceptance for full range of emotions and how emotions can serve and inform our lives.  Shaune PollackBrown, Jennifer B, COUNS
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
## 2013-04-14 NOTE — Patient Instructions (Signed)
Patient successfully completed MH-IOP today.  Will follow up with [name], [facility] (will call for an appointment), and Landis MartinsMeghan Blankman, NP on 04-25-13 @ 3pm.  Will return to work on 05-02-13, without any restrictions.  Encouraged support groups.
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
## 2013-04-15 ENCOUNTER — Other Ambulatory Visit (HOSPITAL_COMMUNITY): Payer: 59

## 2013-04-18 ENCOUNTER — Other Ambulatory Visit (HOSPITAL_COMMUNITY): Payer: 59

## 2013-04-19 ENCOUNTER — Other Ambulatory Visit (HOSPITAL_COMMUNITY): Payer: 59

## 2013-04-25 ENCOUNTER — Encounter (HOSPITAL_COMMUNITY): Payer: Self-pay | Admitting: Psychiatry

## 2013-04-25 ENCOUNTER — Ambulatory Visit (INDEPENDENT_AMBULATORY_CARE_PROVIDER_SITE_OTHER): Payer: 59 | Admitting: Psychiatry

## 2013-04-25 VITALS — BP 134/96 | HR 92 | Ht 64.5 in | Wt 171.6 lb

## 2013-04-25 DIAGNOSIS — F4310 Post-traumatic stress disorder, unspecified: Secondary | ICD-10-CM

## 2013-04-25 DIAGNOSIS — F332 Major depressive disorder, recurrent severe without psychotic features: Secondary | ICD-10-CM

## 2013-04-25 MED ORDER — VENLAFAXINE HCL ER 150 MG PO CP24: 150.0000 mg | ORAL_CAPSULE | Freq: Every day | ORAL | Status: DC

## 2013-04-25 MED ORDER — HYDROXYZINE HCL 10 MG PO TABS: 10.0000 mg | ORAL_TABLET | Freq: Three times a day (TID) | ORAL | Status: DC | PRN
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
## 2013-04-28 ENCOUNTER — Encounter: Payer: Self-pay | Admitting: Internal Medicine

## 2013-05-03 ENCOUNTER — Ambulatory Visit: Payer: 59 | Admitting: Pulmonary Disease

## 2013-05-03 ENCOUNTER — Ambulatory Visit: Payer: 59 | Admitting: Internal Medicine

## 2013-05-31 ENCOUNTER — Ambulatory Visit (INDEPENDENT_AMBULATORY_CARE_PROVIDER_SITE_OTHER): Payer: 59 | Admitting: Psychiatry

## 2013-05-31 ENCOUNTER — Encounter (HOSPITAL_COMMUNITY): Payer: Self-pay | Admitting: Psychiatry

## 2013-05-31 VITALS — BP 130/94 | HR 82 | Ht 65.0 in | Wt 176.6 lb

## 2013-05-31 DIAGNOSIS — F419 Anxiety disorder, unspecified: Principal | ICD-10-CM

## 2013-05-31 DIAGNOSIS — F329 Major depressive disorder, single episode, unspecified: Secondary | ICD-10-CM

## 2013-05-31 DIAGNOSIS — F341 Dysthymic disorder: Secondary | ICD-10-CM

## 2013-05-31 MED ORDER — HYDROXYZINE HCL 10 MG PO TABS: 10.0000 mg | ORAL_TABLET | Freq: Three times a day (TID) | ORAL | Status: DC | PRN

## 2013-05-31 MED ORDER — VENLAFAXINE HCL ER 150 MG PO CP24: 150.0000 mg | ORAL_CAPSULE | Freq: Every day | ORAL | Status: DC

## 2013-05-31 NOTE — Progress Notes (Signed)
   [facility]  Nancy ChurchKelly Dahlem September 17, 1970  Date:  05/31/13 Subjective:  Patient is here for follow up. Pt is overwhelmed with school. Discussed importance of proper nutrition and exercise, and stress reduction ie deep breathing. She is craving chocolate. Pt denies SI/HI/AVH. She doesn't want to change her medications. Still depressed and anxious, but doing better on medication, energy level is low but more motivated.  Efffexor XR 150 mg po QD, hydroxyzine 10 mg tid prn anxiety. Rtc in 4 weeks.   There were no vitals filed for this visit.  Mental Status Examination  Appearance: casual  Alert: Yes Attention: fair  Cooperative: Yes Eye Contact: Good Speech: slow  Psychomotor Activity: Psychomotor Retardation Memory/Concentration: fair  Oriented: time/date, day of week and month of year Mood: Anxious and Dysphoric Affect: Constricted and Depressed Thought Processes and Associations: Goal Directed, Linear and Logical Fund of Knowledge: Fair Thought Content: preoccupations Insight: Fair Judgement: Fair  Diagnosis:  Depression and anxiety  Treatment Plan:  Rtc in 4 weeks Effexor XR 150 mg po QD Hydroxyzine 10 mg tid prn   Kendrick FriesBLANKMANN, Kilan Banfill, NP

## 2013-06-07 ENCOUNTER — Ambulatory Visit: Payer: 59 | Admitting: Internal Medicine

## 2013-06-09 ENCOUNTER — Other Ambulatory Visit: Payer: Self-pay | Admitting: Internal Medicine

## 2013-06-09 MED ORDER — ALBUTEROL SULFATE HFA 108 (90 BASE) MCG/ACT IN AERS: 2.0000 | INHALATION_SPRAY | Freq: Four times a day (QID) | RESPIRATORY_TRACT | Status: DC | PRN

## 2013-06-27 ENCOUNTER — Ambulatory Visit (INDEPENDENT_AMBULATORY_CARE_PROVIDER_SITE_OTHER): Payer: 59 | Admitting: Psychiatry

## 2013-06-27 ENCOUNTER — Encounter (HOSPITAL_COMMUNITY): Payer: Self-pay | Admitting: Psychiatry

## 2013-06-27 VITALS — BP 140/100 | HR 81 | Ht 64.5 in | Wt 173.4 lb

## 2013-06-27 DIAGNOSIS — F331 Major depressive disorder, recurrent, moderate: Secondary | ICD-10-CM

## 2013-06-27 MED ORDER — VENLAFAXINE HCL 37.5 MG PO TABS: 37.5000 mg | ORAL_TABLET | Freq: Every morning | ORAL | Status: DC

## 2013-06-27 MED ORDER — VENLAFAXINE HCL ER 37.5 MG PO CP24: 37.5000 mg | ORAL_CAPSULE | Freq: Every day | ORAL | Status: DC

## 2013-06-27 MED ORDER — HYDROXYZINE HCL 10 MG PO TABS: 10.0000 mg | ORAL_TABLET | Freq: Three times a day (TID) | ORAL | Status: DC | PRN

## 2013-06-27 MED ORDER — VENLAFAXINE HCL ER 150 MG PO CP24: 150.0000 mg | ORAL_CAPSULE | Freq: Every day | ORAL | Status: DC
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
## 2013-07-05 ENCOUNTER — Encounter: Payer: Self-pay | Admitting: Internal Medicine

## 2013-07-27 ENCOUNTER — Ambulatory Visit (INDEPENDENT_AMBULATORY_CARE_PROVIDER_SITE_OTHER): Payer: 59 | Admitting: Psychiatry

## 2013-07-27 ENCOUNTER — Encounter (HOSPITAL_COMMUNITY): Payer: Self-pay | Admitting: Psychiatry

## 2013-07-27 VITALS — BP 123/94 | HR 96 | Ht 64.5 in | Wt 177.4 lb

## 2013-07-27 DIAGNOSIS — F331 Major depressive disorder, recurrent, moderate: Secondary | ICD-10-CM

## 2013-07-27 MED ORDER — HYDROXYZINE HCL 25 MG PO TABS: 25.0000 mg | ORAL_TABLET | Freq: Three times a day (TID) | ORAL | Status: DC | PRN

## 2013-07-27 MED ORDER — HYDROXYZINE HCL 10 MG PO TABS: 25.0000 mg | ORAL_TABLET | Freq: Three times a day (TID) | ORAL | Status: DC | PRN

## 2013-07-27 MED ORDER — VENLAFAXINE HCL ER 150 MG PO CP24: 150.0000 mg | ORAL_CAPSULE | Freq: Every day | ORAL | Status: DC

## 2013-07-27 MED ORDER — VENLAFAXINE HCL ER 37.5 MG PO CP24: 37.5000 mg | ORAL_CAPSULE | Freq: Every day | ORAL | Status: DC

## 2013-07-27 NOTE — Progress Notes (Signed)
   [facility]  Nancy ChurchKelly Barnes [birth year]  Date:  07/27/13 Subjective: Pt is here for follow up depression Sleeping is fair. Appetite is poor. Doesn't like to cook for self. Mood is "okay." She denies SI/HI/AVH. She is talking with [place] in [place], so she is optimistic about a new romance. She is taking it slowly.  She is tolerating her medications. Discussed TMS with patient, she expressed an interest in that therapy, but is in mild-moderate depression. Rtc in 4 weeks.   There were no vitals filed for this visit.  Mental Status Examination  Appearance: casual  Alert: Yes Attention: fair  Cooperative: Yes Eye Contact: Fair Speech: wdl  Psychomotor Activity: Psychomotor Retardation Memory/Concentration: fair  Oriented: time/date and day of week Mood: Anxious, Depressed and Dysphoric Affect: Congruent and Depressed Thought Processes and Associations: Coherent Fund of Knowledge: Fair Thought Content: preoccupations Insight: Fair Judgement: Fair  Diagnosis: MDD, recurrent, moderate  Treatment Plan:  Rtc in 4 weeks  Effexor XR 150 mg for depression Effexor XR 37.5 mg  Hydroxyzine 25 mg tid prn anxiety/sleep  Nancy FriesBLANKMANN, Arlean Thies, NP

## 2013-08-12 ENCOUNTER — Ambulatory Visit: Payer: 59 | Admitting: Internal Medicine

## 2013-08-30 ENCOUNTER — Ambulatory Visit (HOSPITAL_COMMUNITY): Payer: Self-pay | Admitting: Psychiatry

## 2013-09-09 ENCOUNTER — Encounter (HOSPITAL_COMMUNITY): Payer: Self-pay | Admitting: Psychiatry

## 2013-09-09 ENCOUNTER — Ambulatory Visit (INDEPENDENT_AMBULATORY_CARE_PROVIDER_SITE_OTHER): Payer: 59 | Admitting: Psychiatry

## 2013-09-09 VITALS — BP 134/96 | HR 83 | Ht 64.5 in | Wt 177.0 lb

## 2013-09-09 DIAGNOSIS — G4700 Insomnia, unspecified: Secondary | ICD-10-CM

## 2013-09-09 DIAGNOSIS — F331 Major depressive disorder, recurrent, moderate: Secondary | ICD-10-CM

## 2013-09-09 MED ORDER — VENLAFAXINE HCL ER 37.5 MG PO CP24: 37.5000 mg | ORAL_CAPSULE | Freq: Every day | ORAL | Status: DC

## 2013-09-09 MED ORDER — VENLAFAXINE HCL ER 150 MG PO CP24: 150.0000 mg | ORAL_CAPSULE | Freq: Every day | ORAL | Status: DC

## 2013-09-09 MED ORDER — HYDROXYZINE HCL 25 MG PO TABS: 25.0000 mg | ORAL_TABLET | Freq: Three times a day (TID) | ORAL | Status: DC | PRN

## 2013-09-09 NOTE — Progress Notes (Signed)
   [facility]  Nancy Barnes 11/14/1970  Date:  09/09/13 Subjective:  Sleep is poor. Sleep study in October. Appetite is fair. Mood is dysphoric and anxious. Pt does kayaking, and photography, as coping skills.  Has a lot of stressors at home, ie sister that is narcissistic, daughter is sexually active. Talked to Clinical research associate about these topics.  She is overwhelmed at home. She is in school, has a full time job, as well as Theatre stage manager prn.Needs to see therapist, every month. Discussed taking some time for herself. Tolerating the medications. She denies SI/HI/AVH.   Filed Vitals:   09/09/13 1537  BP: 134/96  Pulse: 83    Mental Status Examination  Appearance: casual  Alert: Yes Attention: fair  Cooperative: Yes Eye Contact: Fair Speech:wnl  Psychomotor Activity: Normal Memory/Concentration: fair  Oriented: time/date and situation Mood: Anxious and Dysphoric Affect: Appropriate Thought Processes and Associations: Circumstantial Fund of Knowledge: Fair Thought Content: preoccupations Insight: Fair Judgement: Fair  Diagnosis:  Insomnia MDD, recurrent, moderate Treatment Plan:  Hydroxyzine 25 mg tid prn anxiety Venlafaxine XR 150 mg po for depression Venlafaxine XR 37.5 mg po for depression   Kendrick Fries, NP

## 2013-10-04 IMAGING — CR DG CHEST 2V
3 series · 3 of 3 positions shown · non-contrast
Comparison: Multiple prior chest radiographs, most recent dated
December 11, 2011

CLINICAL DATA: Chest pain and cough; asthma.  Recent
pneumomediastinum.

CHEST - 2 VIEW

[view not recorded (1 of 3)]
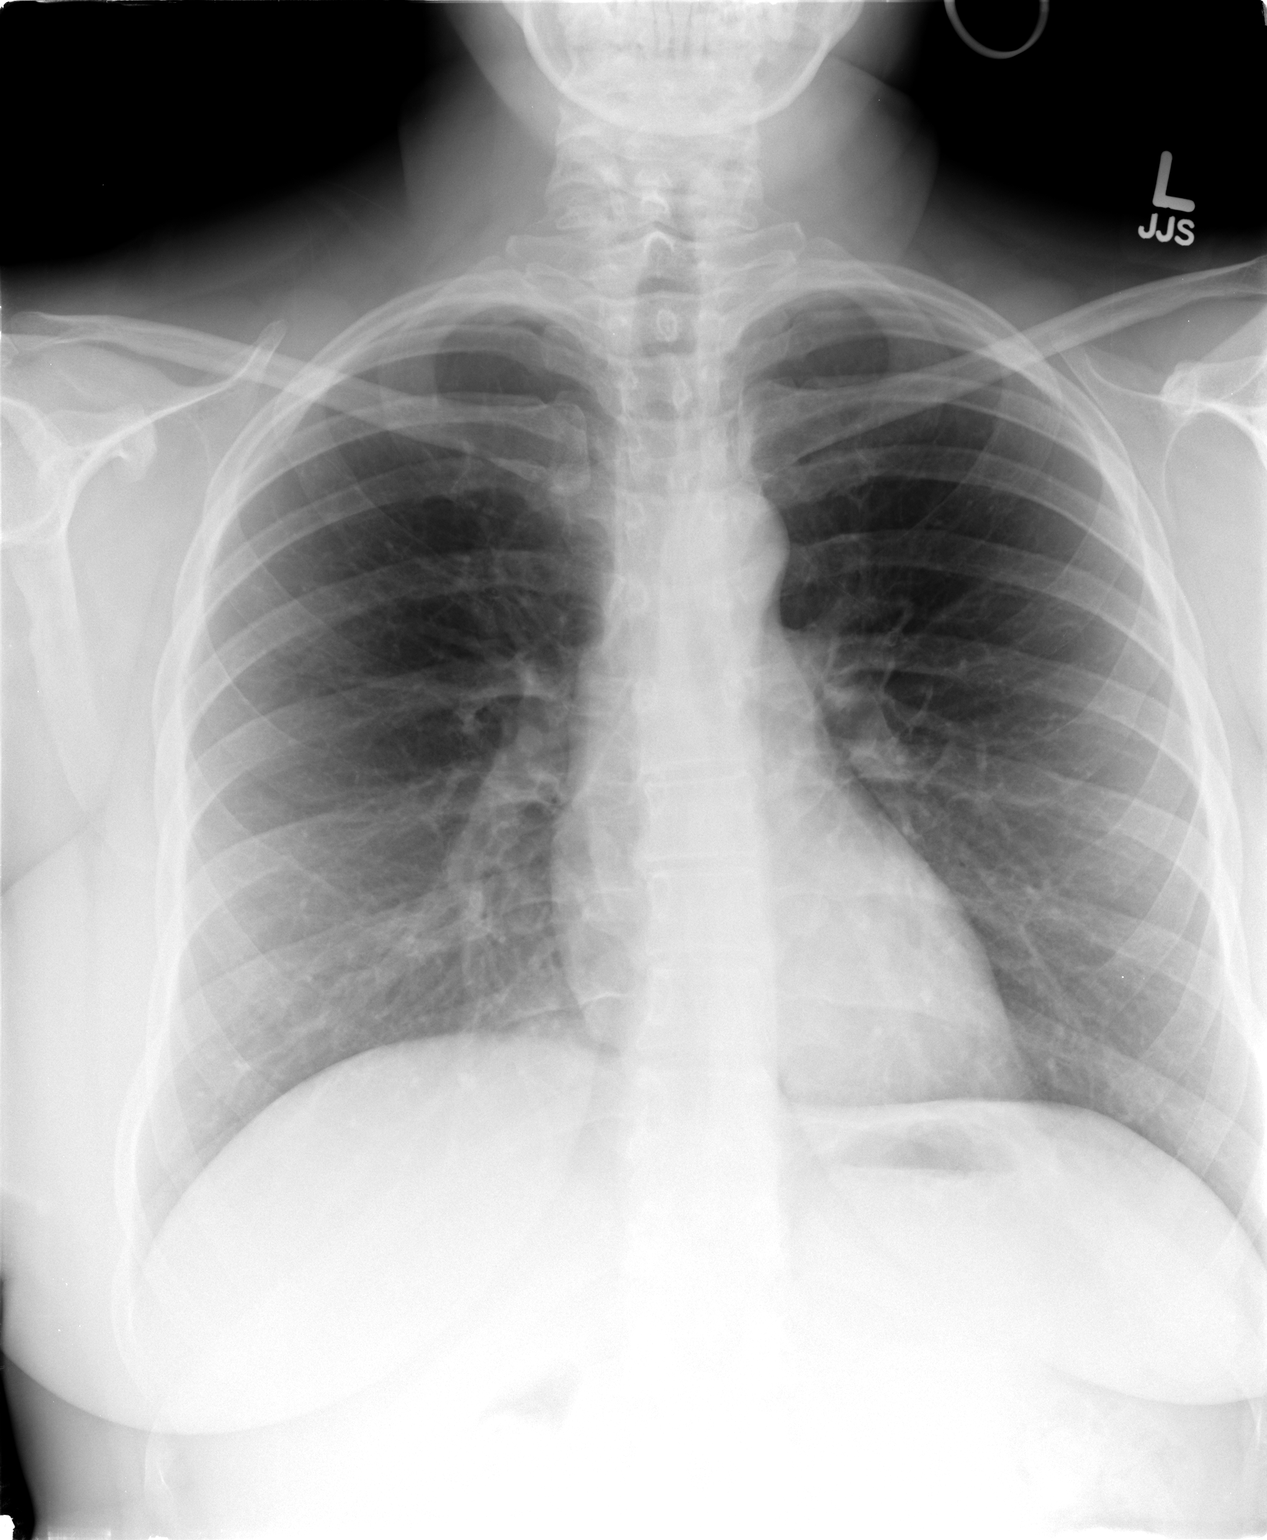

[view not recorded (2 of 3)]
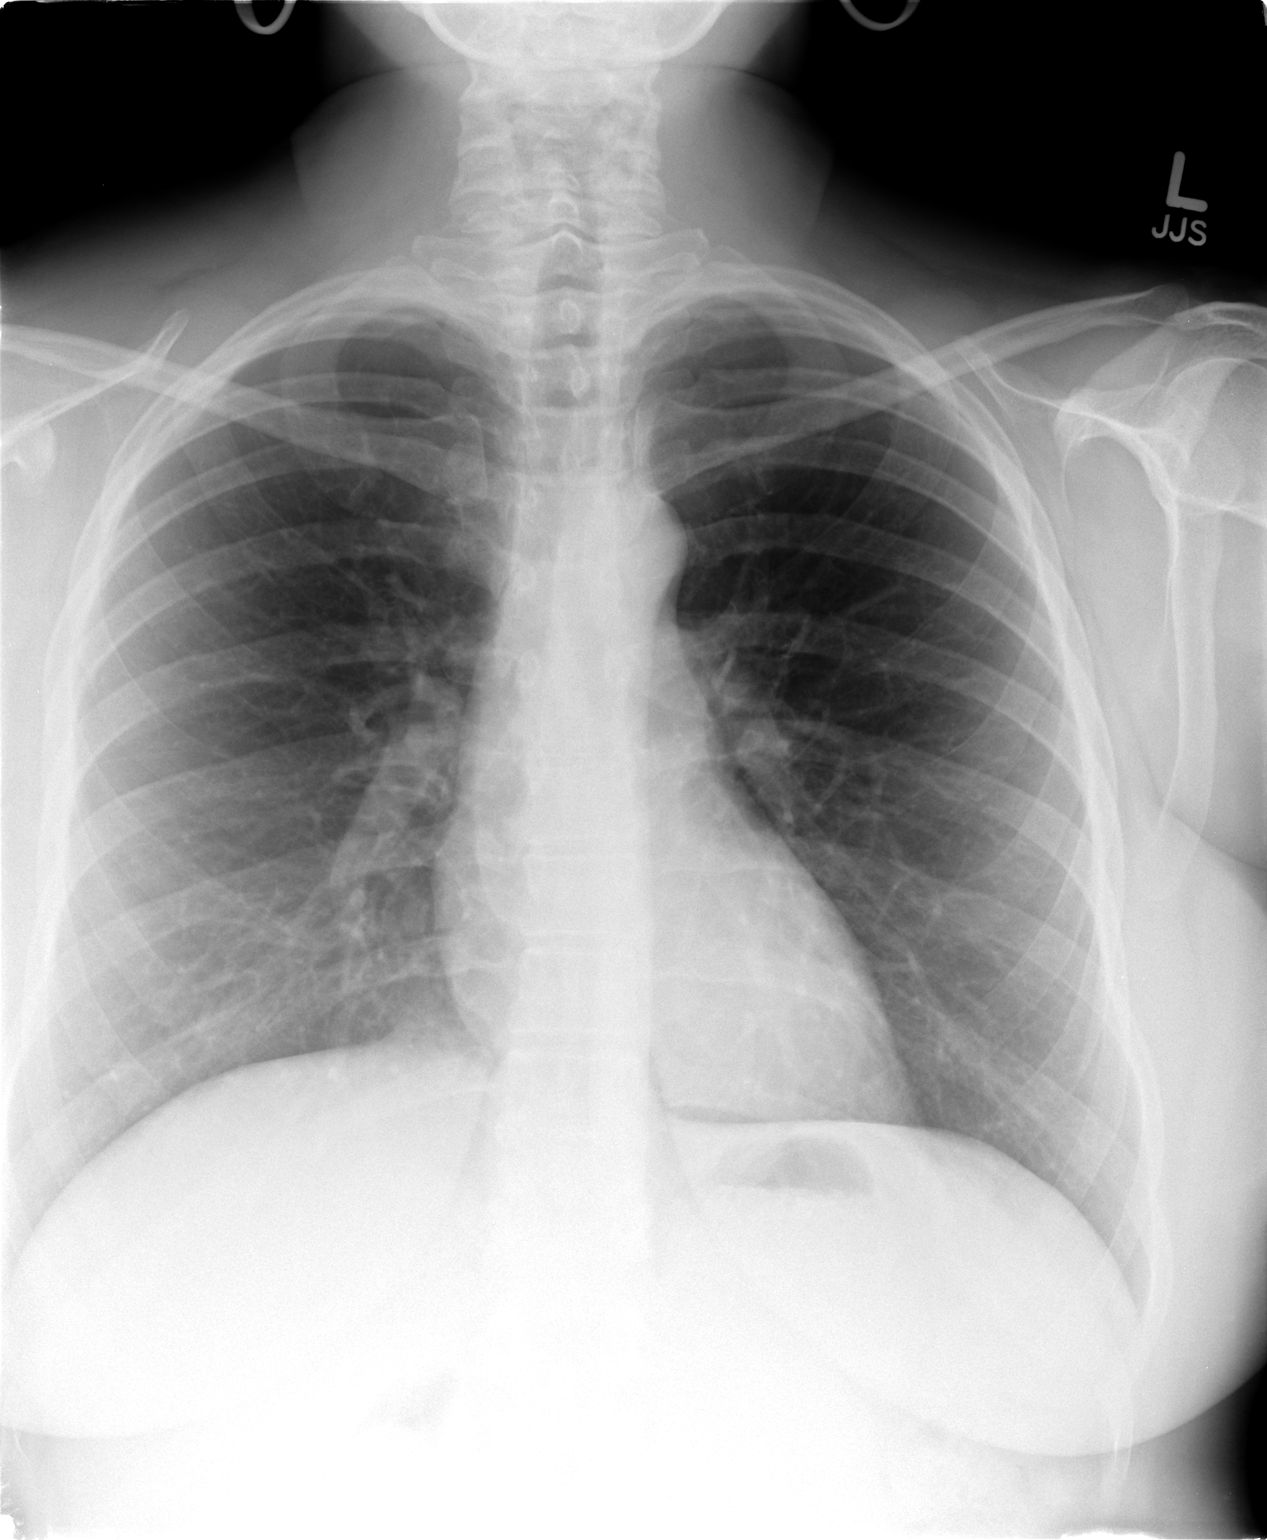

[view not recorded (3 of 3)]
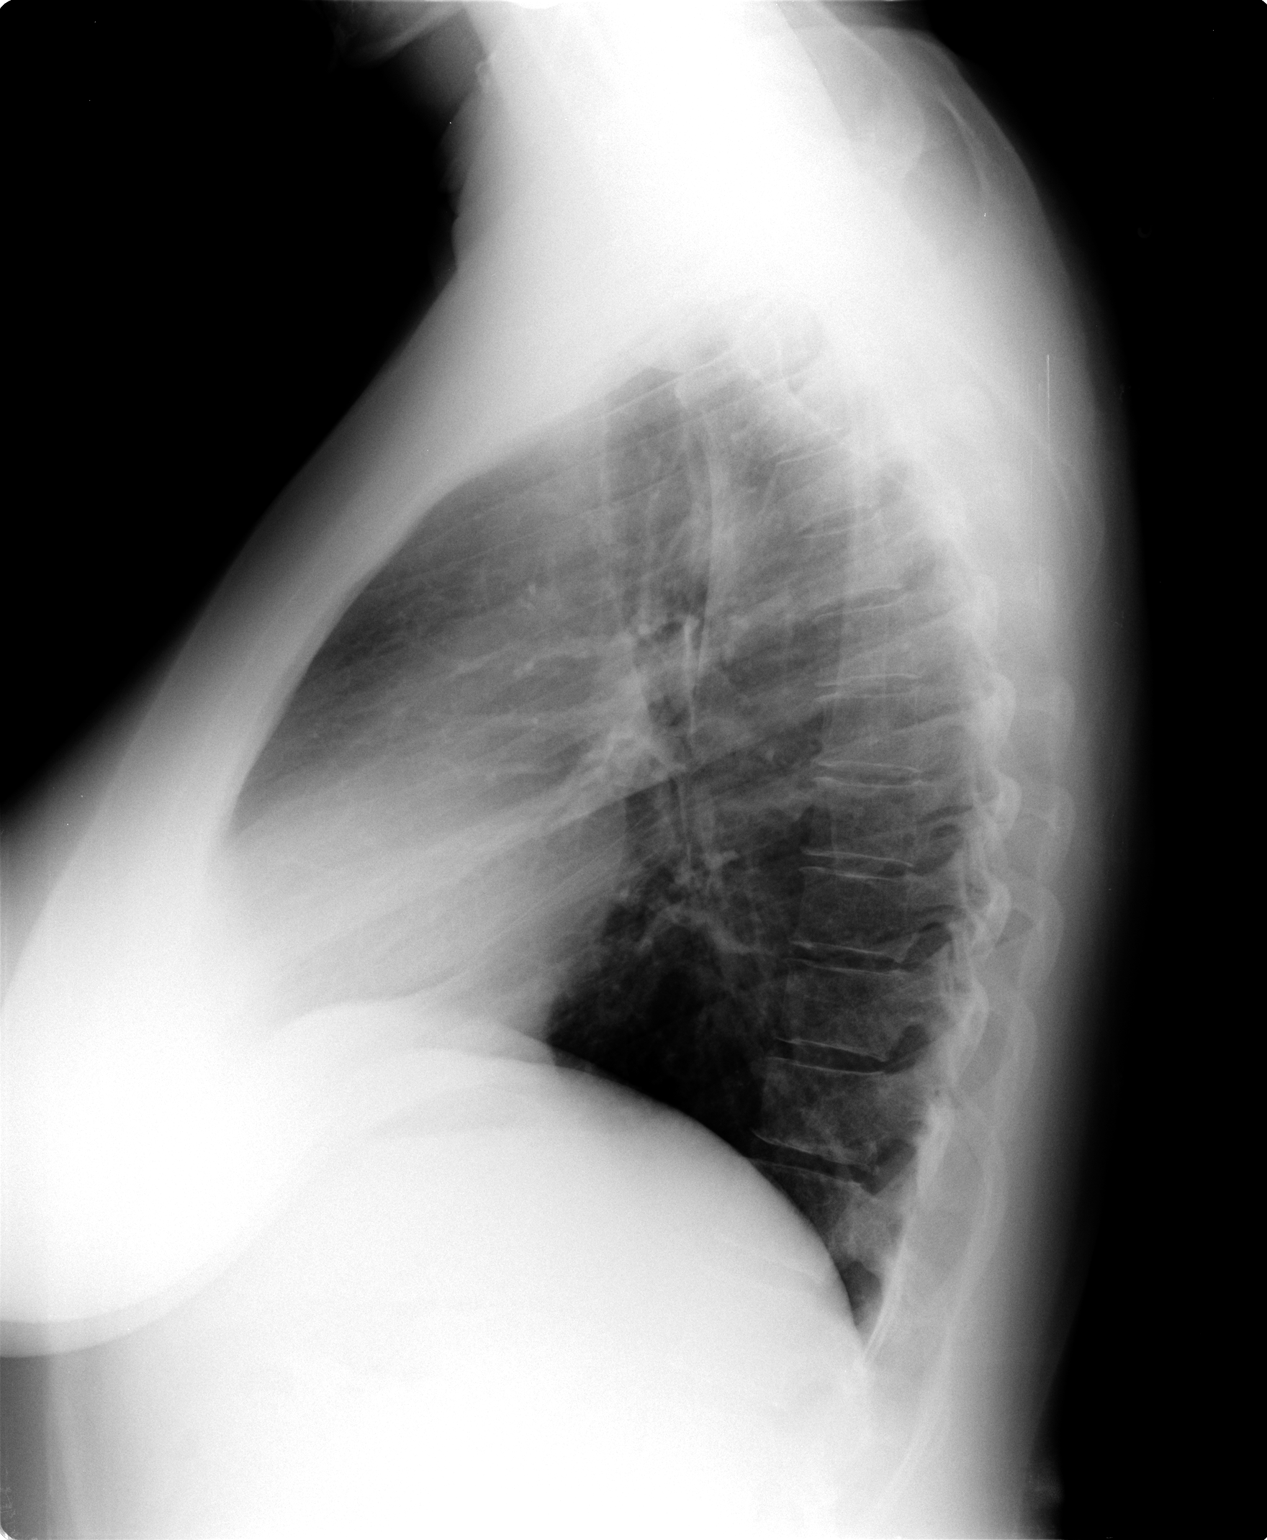

[3 of 3 positions shown; findings below may reference images not displayed]

FINDINGS: Lungs clear.  The heart size and pulmonary vascularity
are normal.  No adenopathy.  No pneumothorax or pneumomediastinum.
No bony abnormality.
IMPRESSION: No abnormality noted.

## 2013-10-11 ENCOUNTER — Ambulatory Visit (HOSPITAL_COMMUNITY): Payer: Self-pay | Admitting: Psychiatry

## 2013-10-24 ENCOUNTER — Ambulatory Visit (HOSPITAL_BASED_OUTPATIENT_CLINIC_OR_DEPARTMENT_OTHER): Payer: 59 | Attending: Internal Medicine | Admitting: Radiology

## 2013-10-24 VITALS — Ht 64.5 in | Wt 176.0 lb

## 2013-10-24 DIAGNOSIS — G4730 Sleep apnea, unspecified: Secondary | ICD-10-CM | POA: Insufficient documentation

## 2013-10-24 DIAGNOSIS — G4733 Obstructive sleep apnea (adult) (pediatric): Secondary | ICD-10-CM

## 2013-10-29 DIAGNOSIS — G4733 Obstructive sleep apnea (adult) (pediatric): Secondary | ICD-10-CM

## 2013-10-29 NOTE — Sleep Study (Signed)
   NAME: Nancy ChurchKelly Barnes DATE OF BIRTH:  April 03, 1970 MEDICAL RECORD NUMBER [id number]  LOCATION: [facility]  PHYSICIAN: Emmakate Hypes D  DATE OF STUDY: 10/24/2013  SLEEP STUDY TYPE: Nocturnal Polysomnogram               REFERRING PHYSICIAN: Christia ReadingBates, Dwight, MD  INDICATION FOR STUDY: Hypersomnia with sleep apnea  EPWORTH SLEEPINESS SCORE:   12/24 HEIGHT: 5' 4.5" (163.8 cm)  WEIGHT: 176 lb (79.833 kg)    Body mass index is 29.75 kg/(m^2).  NECK SIZE: 14 in.  MEDICATIONS: Charted for review  SLEEP ARCHITECTURE: Total sleep time 329 minutes with sleep efficiency 75.8%. Stage I was 9.4%, stage II 74%, stage III 3.6%, REM 12.9% of total sleep time. Sleep latency 35.5 minutes, REM latency 354.5 minutes, awake after sleep onset 68 minutes, arousal index 24.3.  RESPIRATORY DATA: Apnea hypopneas index (AHI) 3.5 per hour. 19 total events scored including 7 obstructive apneas, 3 central apneas, 9 hypopneas. Events were seen in all positions. REM AHI 7.1 per hour. There were not enough events to permit split protocol CPAP titration.  OXYGEN DATA: Moderately loud snoring with oxygen desaturation to a nadir of 93% and mean saturation 97.6% on room air.  CARDIAC DATA: Normal sinus rhythm  MOVEMENT/PARASOMNIA: No significant movement disturbance, bathroom x1  IMPRESSION/ RECOMMENDATION:   1) Within normal limits. Occasional respiratory event with sleep disturbance, AHI 3.5 per hour. The normal range for adults is an AHI from 0-5 events per hour). Events were seen in all positions. REM AHI 7.1 per hour. Moderately loud snoring with oxygen desaturation to a nadir of 93% and mean saturation 97.6% on room air.   Waymon BudgeYOUNG,Danilo Cappiello D Diplomate, American Board of Sleep Medicine  ELECTRONICALLY SIGNED ON:  10/29/2013, 10:17 AM [facility] PH: [phone]   FX: [phone] ACCREDITED BY THE AMERICAN ACADEMY OF SLEEP MEDICINE

## 2013-11-03 ENCOUNTER — Ambulatory Visit (INDEPENDENT_AMBULATORY_CARE_PROVIDER_SITE_OTHER): Payer: 59 | Admitting: Psychiatry

## 2013-11-03 ENCOUNTER — Encounter (HOSPITAL_COMMUNITY): Payer: Self-pay | Admitting: Psychiatry

## 2013-11-03 VITALS — BP 150/106 | HR 75 | Ht 64.5 in | Wt 176.4 lb

## 2013-11-03 DIAGNOSIS — F331 Major depressive disorder, recurrent, moderate: Secondary | ICD-10-CM

## 2013-11-03 DIAGNOSIS — F411 Generalized anxiety disorder: Secondary | ICD-10-CM | POA: Insufficient documentation

## 2013-11-03 DIAGNOSIS — F333 Major depressive disorder, recurrent, severe with psychotic symptoms: Secondary | ICD-10-CM | POA: Insufficient documentation

## 2013-11-03 MED ORDER — VENLAFAXINE HCL ER 37.5 MG PO CP24: 37.5000 mg | ORAL_CAPSULE | Freq: Every day | ORAL | Status: DC

## 2013-11-03 MED ORDER — VENLAFAXINE HCL ER 150 MG PO CP24: 150.0000 mg | ORAL_CAPSULE | Freq: Every day | ORAL | Status: DC

## 2013-11-03 NOTE — Progress Notes (Signed)
[facility] [id number] [facility]  Nancy ChurchKelly Barnes [id number] 43 y.o.  11/03/2013 9:29 AM  Chief Complaint: running all day  History of Present Illness: Pt had a sleep study and was told she only had one good hour of sleep, loud snoring. She doesn' t have sleep apnea and was recommended breath right strips. She is not using them b/c it causes acne.  Energy is low but is able to do things as long as she is busy. She feels like she could sleep all the time.   Pt has taken a break from school.   Pt doesn't socialize much and doesn't go out much but knows she needs to. Pt reports low self esteem. It majorly contributes to her depression. Pt feels down 2-3x/week. On those days she has anhedonia. Endorsing worthlessness and hopelessness. Concentration is on/off. Appetite is so/so. States she just wants to feel happy again and "be normal".   Pt states she worries a lot but has not had a panic attack in a long while. Pt worries about money, food, work, health, her parents, her children, ect. She takes Vistaril 1-2x/month.   Pt is taking Effexor as prescribed and denies SE.   Suicidal Ideation: No Plan Formed: No Patient has means to carry out plan: No  Homicidal Ideation: No Plan Formed: No Patient has means to carry out plan: No  Review of Systems: Psychiatric: Agitation: No Hallucination: No Depressed Mood: Yes Insomnia: Yes Hypersomnia: Yes Altered Concentration: No Feels Worthless: Yes Grandiose Ideas: No Belief In Special Powers: No New/Increased Substance Abuse: No Compulsions: No  Neurologic: Headache: Yes Seizure: No Paresthesias: No  Review of Systems  Constitutional: Positive for malaise/fatigue. Negative for fever and chills.  HENT: Positive for nosebleeds. Negative for congestion and sore throat.   Eyes: Negative for blurred vision and redness.  Respiratory: Negative for cough and shortness of breath.   Cardiovascular: Positive for chest pain. Negative  for palpitations and leg swelling.  Gastrointestinal: Positive for heartburn. Negative for nausea, vomiting and abdominal pain.  Musculoskeletal: Negative for back pain, joint pain, myalgias and neck pain.  Skin: Negative for itching and rash.  Neurological: Positive for headaches. Negative for dizziness, sensory change, focal weakness and seizures.  Psychiatric/Behavioral: Positive for depression. Negative for suicidal ideas, hallucinations and substance abuse. The patient is nervous/anxious. The patient does not have insomnia.      Past Medical Family, Social History: Pt has 2 kids who live at home with her. She is divorced. 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  reports that she has never smoked. She has never used smokeless tobacco. She reports that she does not drink alcohol or use illicit drugs.  Family History  Problem Relation Age of Onset  . Heart attack Mother     Bypass x5  . Allergies Mother   . Diabetes Mellitus II Father   . Hypertension Father   . Allergies Daughter   . Allergies Maternal Grandmother   . Atrial fibrillation Maternal Grandmother   . Stroke Maternal Grandmother   . Suicidality Neg Hx   . Depression Neg Hx   . Anxiety disorder Neg Hx     Past Medical History  Diagnosis Date  . Asthma   . Hypokalemia   . Anxiety   . Depression   . Seasonal allergies   . Environmental allergies     food/medication  . Pneumomediastinum   .  Suicidal ideations   . Headache(784.0)   . Hypertension      Outpatient Encounter Prescriptions as of 11/03/2013  Medication Sig  . albuterol (PROVENTIL HFA;VENTOLIN HFA) 108 (90 BASE) MCG/ACT inhaler Inhale 2 puffs into the lungs every 6 (six) hours as needed for wheezing or shortness of breath.  . cetirizine (ZYRTEC) 10 MG tablet Take 1 tablet (10 mg total) by mouth daily.  Marland Kitchen EPINEPHrine (EPIPEN 2-PAK) 0.3 mg/0.3 mL SOAJ  injection Use as directed  . hydrOXYzine (ATARAX/VISTARIL) 25 MG tablet Take 1 tablet (25 mg total) by mouth 3 (three) times daily as needed for anxiety.  Marland Kitchen ipratropium-albuterol (DUONEB) 0.5-2.5 (3) MG/3ML SOLN Take 3 mLs by nebulization every 6 (six) hours as needed (shortness of breath/wheezing.).  Marland Kitchen lisinopril (PRINIVIL,ZESTRIL) 10 MG tablet Take 1 tablet (10 mg total) by mouth daily.  Marland Kitchen omeprazole (PRILOSEC) 20 MG capsule Take 1 capsule (20 mg total) by mouth daily.  Marland Kitchen Specialty Vitamins Products (MAGNESIUM, AMINO ACID CHELATE,) 133 MG tablet Take 1 tablet by mouth 2 (two) times daily.  Marland Kitchen venlafaxine XR (EFFEXOR XR) 37.5 MG 24 hr capsule Take 1 capsule (37.5 mg total) by mouth daily.  Marland Kitchen venlafaxine XR (EFFEXOR-XR) 150 MG 24 hr capsule Take 1 capsule (150 mg total) by mouth daily with breakfast.    Past Psychiatric History/Hospitalization(s): Anxiety: Yes Bipolar Disorder: No Depression: Yes Mania: No Psychosis: No Schizophrenia: No Personality Disorder: No Hospitalization for psychiatric illness: Yes History of Electroconvulsive Shock Therapy: No Prior Suicide Attempts: Yes  Physical Exam: Constitutional:  BP 134/99  Pulse 82  Ht 5' 4.5" (1.638 m)  Wt 176 lb 6.4 oz (80.015 kg)  BMI 29.82 kg/m2  General Appearance: alert, oriented, no acute distress  Musculoskeletal: Strength & Muscle Tone: within normal limits Gait & Station: normal Patient leans: N/A  Mental Status Examination/Evaluation: Objective: Attitude: Calm and cooperative  Appearance: Well Groomed, appears to be stated age  Eye Contact::  Good  Speech:  Clear and Coherent and Normal Rate  Volume:  Normal  Mood:  depressed  Affect:  Full Range  Thought Process:  Goal Directed, Linear and Logical  Orientation:  Full (Time, Place, and Person)  Thought Content:  Negative  Suicidal Thoughts:  No  Homicidal Thoughts:  No  Judgement:  Good  Insight:  Good  Concentration: good  Memory:  Immediate-good Recent-good Remote-good  Recall: fair  Language: fair  Gait and Station: normal  [employer] of Knowledge: average  Psychomotor Activity:  Normal  Akathisia:  No  Handed:  Right  AIMS (if indicated):  n/a  Assets:  Manufacturing systems engineer Desire for Improvement Financial Resources/Insurance Housing Leisure Time Physical Health Resilience Water engineer (Choose Three): Review of Psycho-Social Stressors (1), Review or order clinical lab tests (1), Established Problem, Worsening (2) and Review of Medication Regimen & Side Effects (2)  Assessment: Axis I: MDD- recurrent, moderate; GAD  Axis II: deferred  Axis III:  Past Medical History  Diagnosis Date  . Asthma   . Hypokalemia   . Anxiety   . Depression   . Seasonal allergies   . Environmental allergies     food/medication  . Pneumomediastinum   . Suicidal ideations   . Headache(784.0)   . Hypertension    Axis IV: poor coping skills, financial stressors  Axis V: GAF 60   Plan:  Medication management with supportive therapy. Risks/benefits and SE of the medication discussed. Pt verbalized understanding and verbal consent obtained for  treatment.  Affirm with the patient that the medications are taken as ordered. Patient expressed understanding of how their medications were to be used.  - no change in symptoms  Vistaril 25mg  TID prn anxiety and insomnia Effexor XR 37.5mg  + 150mg  qD for depression. Pt could benefit from increase in dose but does not want dose or medication changed today. May consider increasing dose in the future.  May consider adding Wellbutrin for mood augmentation  Labs: 03/19/13 K low, gluc elevated; UDS negative  Therapy: brief supportive therapy provided. Discussed psychosocial stressors in detail.    Pt denies SI and is at an acute low risk for suicide.Patient told to call clinic if any problems occur. Patient advised to go to ER if they  should develop SI/HI, side effects, or if symptoms worsen. Has crisis numbers to call if needed. Pt verbalized understanding.  F/up in 2 months or sooner if needed   Oletta DarterAGARWAL, Renella Steig, MD 11/03/2013

## 2013-11-18 ENCOUNTER — Emergency Department (HOSPITAL_COMMUNITY)
Admission: EM | Admit: 2013-11-18 | Discharge: 2013-11-18 | Disposition: A | Discharge status: Home / Self Care | Payer: 59 | Attending: Emergency Medicine | Admitting: Emergency Medicine

## 2013-11-18 ENCOUNTER — Encounter (HOSPITAL_COMMUNITY): Payer: Self-pay | Admitting: Emergency Medicine

## 2013-11-18 DIAGNOSIS — I10 Essential (primary) hypertension: Secondary | ICD-10-CM | POA: Insufficient documentation

## 2013-11-18 DIAGNOSIS — Z8639 Personal history of other endocrine, nutritional and metabolic disease: Secondary | ICD-10-CM | POA: Insufficient documentation

## 2013-11-18 DIAGNOSIS — J45901 Unspecified asthma with (acute) exacerbation: Secondary | ICD-10-CM | POA: Diagnosis not present

## 2013-11-18 DIAGNOSIS — Y9289 Other specified places as the place of occurrence of the external cause: Secondary | ICD-10-CM | POA: Insufficient documentation

## 2013-11-18 DIAGNOSIS — T781XXA Other adverse food reactions, not elsewhere classified, initial encounter: Secondary | ICD-10-CM | POA: Insufficient documentation

## 2013-11-18 DIAGNOSIS — Z79899 Other long term (current) drug therapy: Secondary | ICD-10-CM | POA: Insufficient documentation

## 2013-11-18 DIAGNOSIS — Z880 Allergy status to penicillin: Secondary | ICD-10-CM | POA: Diagnosis not present

## 2013-11-18 DIAGNOSIS — F329 Major depressive disorder, single episode, unspecified: Secondary | ICD-10-CM | POA: Insufficient documentation

## 2013-11-18 DIAGNOSIS — R062 Wheezing: Secondary | ICD-10-CM | POA: Diagnosis present

## 2013-11-18 DIAGNOSIS — T7840XA Allergy, unspecified, initial encounter: Secondary | ICD-10-CM

## 2013-11-18 DIAGNOSIS — F419 Anxiety disorder, unspecified: Secondary | ICD-10-CM | POA: Insufficient documentation

## 2013-11-18 DIAGNOSIS — X58XXXA Exposure to other specified factors, initial encounter: Secondary | ICD-10-CM | POA: Diagnosis not present

## 2013-11-18 DIAGNOSIS — Y9389 Activity, other specified: Secondary | ICD-10-CM | POA: Diagnosis not present

## 2013-11-18 MED ORDER — EPINEPHRINE 0.3 MG/0.3ML IJ SOAJ: 0.3000 mg | Freq: Once | INTRAMUSCULAR | Status: DC

## 2013-11-18 NOTE — Discharge Instructions (Signed)
Epinephrine Injection Epinephrine is a medicine given by injection to temporarily treat an emergency allergic reaction. It is also used to treat severe asthmatic attacks and other lung problems. The medicine helps to enlarge (dilate) the small breathing tubes of the lungs. A life-threatening, sudden allergic reaction that involves the whole body is called anaphylaxis. Because of potential side effects, epinephrine should only be used as directed by your caregiver. RISKS AND COMPLICATIONS Possible side effects of epinephrine injections include:  Chest pain.  Irregular or rapid heartbeat.  Shortness of breath.  Nausea.  Vomiting.  Abdominal pain or cramping.  Sweating.  Dizziness.  Weakness.  Headache.  Nervousness. Report all side effects to your caregiver. HOW TO GIVE AN EPINEPHRINE INJECTION Give the epinephrine injection immediately when symptoms of a severe reaction begin. Inject the medicine into the outer thigh or any available, large muscle. Your caregiver can teach you how to do this. You do not need to remove any clothing. After the injection, call your local emergency services (911 in [place] Even if you improve after the injection, you need to be examined at a hospital emergency department. Epinephrine works quickly, but it also wears off quickly. Delayed reactions can occur. A delayed reaction may be as serious and dangerous as the initial reaction. HOME CARE INSTRUCTIONS  Make sure you and your family know how to give an epinephrine injection.  Use epinephrine injections as directed by your caregiver. Do not use this medicine more often or in larger doses than prescribed.  Always carry your epinephrine injection or anaphylaxis kit with you. This can be lifesaving if you have a severe reaction.  Store the medicine in a cool, dry place. If the medicine becomes discolored or cloudy, dispose of it properly and replace it with new medicine.  Check the expiration date on  your medicine. It may be unsafe to use medicines past their expiration date.  Tell your caregiver about any other medicines you are taking. Some medicines can react badly with epinephrine.  Tell your caregiver about any medical conditions you have, such as diabetes, high blood pressure (hypertension), heart disease, irregular heartbeats, or if you are pregnant. SEEK IMMEDIATE MEDICAL CARE IF:  You have used an epinephrine injection. Call your local emergency services (911 in [place] Even if you improve after the injection, you need to be examined at a hospital emergency department to make sure your allergic reaction is under control. You will also be monitored for adverse effects from the medicine.  You have chest pain.  You have irregular or fast heartbeats.  You have shortness of breath.  You have severe headaches.  You have severe nausea, vomiting, or abdominal cramps.  You have severe pain, swelling, or redness in the area where you gave the injection. Document Released: 12/28/1999 Document Revised: 03/24/2011 Document Reviewed: 09/18/2010 [facility] Patient Information 2015 [place], [place]. This information is not intended to replace advice given to you by your health care provider. Make sure you discuss any questions you have with your health care provider.   Anaphylactic Reaction An anaphylactic reaction is a sudden, severe allergic reaction that involves the whole body. It can be life threatening. A hospital stay is often required. People with asthma, eczema, or hay fever are slightly more likely to have an anaphylactic reaction. CAUSES  An anaphylactic reaction may be caused by anything to which you are allergic. After being exposed to the allergic substance, your immune system becomes sensitized to it. When you are exposed to that allergic substance  again, an allergic reaction can occur. Common causes of an anaphylactic reaction include:  Medicines.  Foods, especially peanuts,  wheat, shellfish, milk, and eggs.  Insect bites or stings.  Blood products.  Chemicals, such as dyes, latex, and contrast material used for imaging tests. SYMPTOMS  When an allergic reaction occurs, the body releases histamine and other substances. These substances cause symptoms such as tightening of the airway. Symptoms often develop within seconds or minutes of exposure. Symptoms may include:  Skin rash or hives.  Itching.  Chest tightness.  Swelling of the eyes, tongue, or lips.  Trouble breathing or swallowing.  Lightheadedness or fainting.  Anxiety or confusion.  Stomach pains, vomiting, or diarrhea.  Nasal congestion.  A fast or irregular heartbeat (palpitations). DIAGNOSIS  Diagnosis is based on your history of recent exposure to allergic substances, your symptoms, and a physical exam. Your caregiver may also perform blood or urine tests to confirm the diagnosis. TREATMENT  Epinephrine medicine is the main treatment for an anaphylactic reaction. Other medicines that may be used for treatment include antihistamines, steroids, and albuterol. In severe cases, fluids and medicine to support blood pressure may be given through an intravenous line (IV). Even if you improve after treatment, you need to be observed to make sure your condition does not get worse. This may require a stay in the hospital. [place] a medical alert bracelet or necklace stating your allergy.  You and your family must learn how to use an anaphylaxis kit or give an epinephrine injection to temporarily treat an emergency allergic reaction. Always carry your epinephrine injection or anaphylaxis kit with you. This can be lifesaving if you have a severe reaction.  Do not drive or perform tasks after treatment until the medicines used to treat your reaction have worn off, or until your caregiver says it is okay.  If you have hives or a rash:  Take medicines as directed by your  caregiver.  You may use an over-the-counter antihistamine (diphenhydramine) as needed.  Apply cold compresses to the skin or take baths in cool water. Avoid hot baths or showers. SEEK MEDICAL CARE IF:   You develop symptoms of an allergic reaction to a new substance. Symptoms may start right away or minutes later.  You develop a rash, hives, or itching.  You develop new symptoms. SEEK IMMEDIATE MEDICAL CARE IF:   You have swelling of the mouth, difficulty breathing, or wheezing.  You have a tight feeling in your chest or throat.  You develop hives, swelling, or itching all over your body.  You develop severe vomiting or diarrhea.  You feel faint or pass out. This is an emergency. Use your epinephrine injection or anaphylaxis kit as you have been instructed. Call your local emergency services (911 in [place] Even if you improve after the injection, you need to be examined at a hospital emergency department. MAKE SURE YOU:   Understand these instructions.  Will watch your condition.  Will get help right away if you are not doing well or get worse. Document Released: 12/30/2004 Document Revised: 01/04/2013 Document Reviewed: 04/02/2011 [facility] Patient Information 2015 [place], [place]. This information is not intended to replace advice given to you by your health care provider. Make sure you discuss any questions you have with your health care provider.

## 2013-11-18 NOTE — ED Provider Notes (Signed)
TIME SEEN: 10:26 AM  CHIEF COMPLAINT: allergic reaction  HPI: Pt is a 43 y.o. F history of asthma, multiple food and medication allergies, hypertension, depression who presents to the emergency department with episode of hives, scratchy throat, wheezing and shortness of breath that started approximate 5 minutes after eating a bagel with knots in it this morning and around 8:30 AM. She reports that she gave herself an epinephrine injection intramuscularly around 9 AM. Upon EMS arrival, patient was given 50 mg of IV Benadryl 50 mg of IV Zantac and 10 mg of albuterol neb treatment. She reports she is feeling much better. She was not given steroids because she reports a allergic reaction with urticaria and shortness of breath to prednisone. She reports when she has had these episodes in the past she has never been discharged on steroids. No other new exposures. She did have vomiting today after eating a bagel but no diarrhea. No fevers or chills. No cough.  ROS: See HPI Constitutional: no fever  Eyes: no drainage  ENT: no runny nose   Cardiovascular:  no chest pain  Resp: SOB  GI: no vomiting GU: no dysuria Integumentary: no rash  Allergy: hives  Musculoskeletal: no leg swelling  Neurological: no slurred speech ROS otherwise negative  PAST MEDICAL HISTORY/PAST SURGICAL HISTORY:  Past Medical History  Diagnosis Date  . Asthma   . Hypokalemia   . Anxiety   . Depression   . Seasonal allergies   . Environmental allergies     food/medication  . Pneumomediastinum   . Suicidal ideations   . Headache(784.0)   . Hypertension     MEDICATIONS:  Prior to Admission medications   Medication Sig Start Date End Date Taking? Authorizing Provider  albuterol (PROVENTIL HFA;VENTOLIN HFA) 108 (90 BASE) MCG/ACT inhaler Inhale 2 puffs into the lungs every 6 (six) hours as needed for wheezing or shortness of breath. 06/09/13   Waymon Budgelinton D Young, MD  cetirizine (ZYRTEC) 10 MG tablet Take 1 tablet (10 mg  total) by mouth daily. 03/26/13   Nanine MeansJamison Lord, NP  EPINEPHrine (EPIPEN 2-PAK) 0.3 mg/0.3 mL SOAJ injection Use as directed 03/26/13   Nanine MeansJamison Lord, NP  hydrOXYzine (ATARAX/VISTARIL) 25 MG tablet Take 1 tablet (25 mg total) by mouth 3 (three) times daily as needed for anxiety. 09/09/13   Meghan Blankmann, NP  ipratropium-albuterol (DUONEB) 0.5-2.5 (3) MG/3ML SOLN Take 3 mLs by nebulization every 6 (six) hours as needed (shortness of breath/wheezing.).    Historical Provider, MD  lisinopril (PRINIVIL,ZESTRIL) 10 MG tablet Take 1 tablet (10 mg total) by mouth daily. 03/26/13   Nanine MeansJamison Lord, NP  omeprazole (PRILOSEC) 20 MG capsule Take 1 capsule (20 mg total) by mouth daily. 03/26/13   Nanine MeansJamison Lord, NP  Specialty Vitamins Products (MAGNESIUM, AMINO ACID CHELATE,) 133 MG tablet Take 1 tablet by mouth 2 (two) times daily.    Historical Provider, MD  venlafaxine XR (EFFEXOR XR) 37.5 MG 24 hr capsule Take 1 capsule (37.5 mg total) by mouth daily. 11/03/13 11/03/14  Oletta DarterSalina Agarwal, MD  venlafaxine XR (EFFEXOR-XR) 150 MG 24 hr capsule Take 1 capsule (150 mg total) by mouth daily with breakfast. 11/03/13   Oletta DarterSalina Agarwal, MD    ALLERGIES:  Allergies  Allergen Reactions  . Nsaids Shortness Of Breath  . Shellfish Allergy Anaphylaxis  . Abilify [Aripiprazole] Other (See Comments)    hallucinations  . Amoxicillin   . Fish Allergy Other (See Comments)    Reaction unknown  . Kasandra KnudsenLatuda [Lurasidone Hcl] Other (See  Comments)    Uncontrollable shaking, hopelessness.  Barbera Setters [Levofloxacin In D5w] Other (See Comments)    Reaction unknown  . Other Other (See Comments)    Reaction unknown to nuts  . Penicillins Other (See Comments)    Reaction unknown  . Prednisone Other (See Comments)    Pruritus/ hives  . Seroquel [Quetiapine Fumarate] Other (See Comments)    Patient states it made her not care about the world and she just sat down and didn't get up.  . Sulfa Antibiotics Other (See Comments)    Reaction  unknown  . Ativan [Lorazepam] Anxiety    Shakiness  . Xanax [Alprazolam] Anxiety    Makes pt. anxious    SOCIAL HISTORY:  History  Substance Use Topics  . Smoking status: Never Smoker   . Smokeless tobacco: Never Used  . Alcohol Use: No    FAMILY HISTORY: Family History  Problem Relation Age of Onset  . Heart attack Mother     Bypass x5  . Allergies Mother   . Diabetes Mellitus II Father   . Hypertension Father   . Allergies Daughter   . Allergies Maternal Grandmother   . Atrial fibrillation Maternal Grandmother   . Stroke Maternal Grandmother   . Suicidality Neg Hx   . Depression Neg Hx   . Anxiety disorder Neg Hx     EXAM: BP 108/89 mmHg  Pulse 110  Temp(Src) 98.5 F (36.9 C) (Oral)  Resp 16  Ht 5\' 4"  (1.626 m)  Wt 176 lb (79.833 kg)  BMI 30.20 kg/m2  SpO2 97% CONSTITUTIONAL: Alert and oriented and responds appropriately to questions. Well-appearing; well-nourished HEAD: Normocephalic EYES: Conjunctivae clear, PERRL ENT: normal nose; no rhinorrhea; moist mucous membranes; pharynx without lesions noted; no trismus or drooling, no angioedema, tongue sits flat in the floor of the mouth, no muffled voice, no stridor NECK: Supple, no meningismus, no LAD  CARD: regular and tachycardic; S1 and S2 appreciated; no murmurs, no clicks, no rubs, no gallops RESP: Normal chest excursion without splinting or tachypnea; breath sounds clear and equal bilaterally; no wheezes, no rhonchi, no rales, no hypoxia or respiratory distress, good aeration, no increased work of breathing ABD/GI: Normal bowel sounds; non-distended; soft, non-tender, no rebound, no guarding BACK:  The back appears normal and is non-tender to palpation, there is no CVA tenderness EXT: Normal ROM in all joints; non-tender to palpation; no edema; normal capillary refill; no cyanosis    SKIN: Normal color for age and race; warm; no urticaria NEURO: Moves all extremities equally PSYCH: The patient's mood and  manner are appropriate. Grooming and personal hygiene are appropriate.  MEDICAL DECISION MAKING: Pt here after the allergic reaction. She received epinephrine that was self-administered, Benadryl, Zantac and albuterol. She reports she is feeling much better. She is hemodynamically stable but mildly tachycardic likely from albuterol and epinephrine. No current complaints. Lungs are clear to auscultation with no wheezing and good aeration. No hypoxia or increased work of breathing. No urticaria present. We'll continue to monitor for at least 4 hours after patient injected her epinephrine pen at 9 AM. Given she reports an anaphylactic type reaction to steroids, will hold on any steroids at this time.  ED PROGRESS: 1:10 PM  Pt is still doing well, heart rate has improved into the 90s. She is otherwise hemodynamically stable with no hypotension. No return of urticaria. No angioedema. Lungs are still clear to auscultation with no increased work of breathing and no hypoxia. I feel she is  safe to be discharged home. Discussed return precautions and importance of using Benadryl at home. We'll discharge with prescription for EpiPen. Patient verbalizes understanding and is comfortable with plan.     Layla MawKristen N Emric Kowalewski, DO 11/18/13 1308

## 2013-11-18 NOTE — ED Notes (Signed)
Pt was eating supposed nut free bagel from [employer] when she developed hives, scratchy throat, mild wheezing and SOB. Pt self administered home epi pen approx 1 hour ago. EMS arrival 18g LAC, 50mg  benedry, 50mg  zantac, 10mg  albuterol given. Pt awake, alert, NAd at present.

## 2013-12-27 ENCOUNTER — Ambulatory Visit (HOSPITAL_COMMUNITY): Payer: Self-pay | Admitting: Psychiatry

## 2014-01-03 ENCOUNTER — Encounter (HOSPITAL_COMMUNITY): Payer: Self-pay | Admitting: Psychiatry

## 2014-01-03 ENCOUNTER — Telehealth (HOSPITAL_COMMUNITY): Payer: Self-pay | Admitting: *Deleted

## 2014-01-03 ENCOUNTER — Ambulatory Visit (INDEPENDENT_AMBULATORY_CARE_PROVIDER_SITE_OTHER): Payer: 59 | Admitting: Psychiatry

## 2014-01-03 VITALS — BP 129/91 | HR 99 | Ht 64.0 in | Wt 178.8 lb

## 2014-01-03 DIAGNOSIS — F331 Major depressive disorder, recurrent, moderate: Secondary | ICD-10-CM

## 2014-01-03 DIAGNOSIS — F411 Generalized anxiety disorder: Secondary | ICD-10-CM

## 2014-01-03 MED ORDER — VENLAFAXINE HCL ER 75 MG PO CP24: 225.0000 mg | ORAL_CAPSULE | Freq: Every day | ORAL | Status: DC

## 2014-01-03 NOTE — Progress Notes (Signed)
Patient ID: Nancy Barnes, female   DOB: Jul 31, 1970, 43 y.o.   MRN: [id number]  [facility] [address] Progress Note  Nancy Barnes [id number] 43 y.o.  01/03/2014 12:00 PM  Chief Complaint: scatter brained  History of Present Illness: Pt messed up last week and took 2 days worth of meds on Friday and skipped meds on Saturday. Reports she is very scatter brained.Tuesday she was very down and was crying a lot.   Energy remains low but is able to do things as long as she is busy. She feels like she could sleep all the time. Pt is sleeping about 8-9 hrs. Appetite is poor and she is unable to lose weight.   Pt still doesn't socialize much and doesn't go out much but knows she needs to. Pt reports low self esteem. It majorly contributes to her depression. Pt feels down 2-3x/week. She has anhedonia. Denies worthlessness and hopelessness. Concentration is poor. States she just wants to feel happy again and "be normal".   Pt states she worries a lot. She had a panic attack last week. She takes Vistaril 1-2x/month.   Pt is taking Effexor as prescribed and denies SE.   Suicidal Ideation: No Plan Formed: No Patient has means to carry out plan: No  Homicidal Ideation: No Plan Formed: No Patient has means to carry out plan: No  Review of Systems: Psychiatric: Agitation: Yes Hallucination: No Depressed Mood: Yes Insomnia: No Hypersomnia: Yes Altered Concentration: Yes Feels Worthless: Yes Grandiose Ideas: No Belief In Special Powers: No New/Increased Substance Abuse: No Compulsions: No  Neurologic: Headache: No Seizure: No Paresthesias: No  Review of Systems  Constitutional: Positive for malaise/fatigue. Negative for fever and chills.  HENT: Positive for nosebleeds. Negative for congestion and sore throat.   Eyes: Negative for blurred vision, discharge and redness.  Respiratory: Negative for cough, sputum production and shortness of breath.   Cardiovascular: Negative for chest  pain, palpitations and leg swelling.  Gastrointestinal: Positive for heartburn. Negative for nausea, vomiting and abdominal pain.  Musculoskeletal: Negative for myalgias, back pain, joint pain and neck pain.  Skin: Negative for itching and rash.  Neurological: Negative for dizziness, sensory change, focal weakness, seizures and headaches.  Psychiatric/Behavioral: Positive for depression. Negative for suicidal ideas, hallucinations and substance abuse. The patient is nervous/anxious. The patient does not have insomnia.      Past Medical Family, Social History: Pt has 2 kids who live at home with her. She is divorced. 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  reports that she has never smoked. She has never used smokeless tobacco. She reports that she does not drink alcohol or use illicit drugs.  Family History  Problem Relation Age of Onset  . Heart attack Mother     Bypass x5  . Allergies Mother   . Diabetes Mellitus II Father   . Hypertension Father   . Allergies Daughter   . Allergies Maternal Grandmother   . Atrial fibrillation Maternal Grandmother   . Stroke Maternal Grandmother   . Suicidality Neg Hx   . Depression Neg Hx   . Anxiety disorder Neg Hx     Past Medical History  Diagnosis Date  . Asthma   . Hypokalemia   . Anxiety   . Depression   . Seasonal allergies   . Environmental allergies     food/medication  . Pneumomediastinum   . Suicidal ideations   .  Headache(784.0)   . Hypertension      Outpatient Encounter Prescriptions as of 01/03/2014  Medication Sig  . albuterol (PROVENTIL HFA;VENTOLIN HFA) 108 (90 BASE) MCG/ACT inhaler Inhale 2 puffs into the lungs every 6 (six) hours as needed for wheezing or shortness of breath.  . cetirizine (ZYRTEC) 10 MG tablet Take 1 tablet (10 mg total) by mouth daily.  Marland Kitchen. EPINEPHrine (EPIPEN 2-PAK) 0.3 mg/0.3 mL SOAJ injection  Use as directed  . EPINEPHrine 0.3 mg/0.3 mL IJ SOAJ injection Inject 0.3 mLs (0.3 mg total) into the muscle once.  . hydrOXYzine (ATARAX/VISTARIL) 25 MG tablet Take 1 tablet (25 mg total) by mouth 3 (three) times daily as needed for anxiety.  Marland Kitchen. ipratropium-albuterol (DUONEB) 0.5-2.5 (3) MG/3ML SOLN Take 3 mLs by nebulization every 6 (six) hours as needed (shortness of breath/wheezing.).  Marland Kitchen. lisinopril (PRINIVIL,ZESTRIL) 10 MG tablet Take 1 tablet (10 mg total) by mouth daily.  Marland Kitchen. lisinopril (PRINIVIL,ZESTRIL) 20 MG tablet Take 20 mg by mouth daily.  . Magnesium 250 MG TABS Take 1 tablet by mouth every other day.  Marland Kitchen. omeprazole (PRILOSEC) 20 MG capsule Take 1 capsule (20 mg total) by mouth daily.  Marland Kitchen. Specialty Vitamins Products (MAGNESIUM, AMINO ACID CHELATE,) 133 MG tablet Take 1 tablet by mouth 2 (two) times daily.  Marland Kitchen. venlafaxine XR (EFFEXOR XR) 37.5 MG 24 hr capsule Take 1 capsule (37.5 mg total) by mouth daily.  Marland Kitchen. venlafaxine XR (EFFEXOR-XR) 150 MG 24 hr capsule Take 1 capsule (150 mg total) by mouth daily with breakfast.    Past Psychiatric History/Hospitalization(s): Anxiety: Yes Bipolar Disorder: No Depression: Yes Mania: No Psychosis: No Schizophrenia: No Personality Disorder: No Hospitalization for psychiatric illness: Yes History of Electroconvulsive Shock Therapy: No Prior Suicide Attempts: Yes  Physical Exam: Constitutional:  BP 129/91 mmHg  Pulse 99  Ht 5\' 4"  (1.626 m)  Wt 178 lb 12.8 oz (81.103 kg)  BMI 30.68 kg/m2  General Appearance: alert, oriented, no acute distress  Musculoskeletal: Strength & Muscle Tone: within normal limits Gait & Station: normal Patient leans: N/A  Mental Status Examination/Evaluation: Objective: Attitude: Calm and cooperative  Appearance: Well Groomed, appears to be stated age  Eye Contact::  Good  Speech:  Clear and Coherent and Normal Rate  Volume:  Normal  Mood:  depressed  Affect:  Full Range  Thought Process:  Goal Directed,  Linear and Logical  Orientation:  Full (Time, Place, and Person)  Thought Content:  Negative  Suicidal Thoughts:  No  Homicidal Thoughts:  No  Judgement:  Good  Insight:  Good  Concentration: good  Memory: Immediate-good Recent-good Remote-good  Recall: fair  Language: fair  Gait and Station: normal  [employer] of Knowledge: average  Psychomotor Activity:  Normal  Akathisia:  No  Handed:  Right  AIMS (if indicated):  n/a  Assets:  Manufacturing systems engineerCommunication Skills Desire for Improvement Financial Resources/Insurance Housing Leisure Time Physical Health Resilience Water engineerTalents/Skills Transportation     Medical Decision Making (Choose Three): Review of Psycho-Social Stressors (1), Review or order clinical lab tests (1), Established Problem, Worsening (2) and Review of Medication Regimen & Side Effects (2)  Assessment: Axis I: MDD- recurrent, moderate; GAD  Axis II: deferred  Axis III:  Past Medical History  Diagnosis Date  . Asthma   . Hypokalemia   . Anxiety   . Depression   . Seasonal allergies   . Environmental allergies     food/medication  . Pneumomediastinum   . Suicidal ideations   . Headache(784.0)   .  Hypertension    Axis IV: poor coping skills, financial stressors  Axis V: GAF 60   Plan:  Medication management with supportive therapy. Risks/benefits and SE of the medication discussed. Pt verbalized understanding and verbal consent obtained for treatment.  Affirm with the patient that the medications are taken as ordered. Patient expressed understanding of how their medications were to be used.  - no change in symptoms  Vistaril 25mg  TID prn anxiety and insomnia Increase Effexor XR to 225mg  qD for depression.  Pt could not tolerate Wellbutrin in the past May consider augmenting with Cytomel  Labs: 03/19/13 K low, gluc elevated; UDS negative  Therapy: brief supportive therapy provided. Discussed psychosocial stressors in detail.    Pt denies SI and is at an  acute low risk for suicide.Patient told to call clinic if any problems occur. Patient advised to go to ER if they should develop SI/HI, side effects, or if symptoms worsen. Has crisis numbers to call if needed. Pt verbalized understanding.  F/up in 2 months or sooner if needed   Oletta DarterAGARWAL, Chelse Matas, MD 01/03/2014

## 2014-01-03 NOTE — Telephone Encounter (Signed)
Contacted pt in response to referral from her psychiatrist for Transcranial Magnetic Stimulation for Major Depressive Disorder. Conducted phone screening for [employer]. Pt screened positive. Administered PHQ-9 over the phone. Pt scored a 20 (severe depression). [facility] for TMS consultation with MD.

## 2014-01-04 ENCOUNTER — Encounter (HOSPITAL_COMMUNITY): Payer: Self-pay | Admitting: Psychiatry

## 2014-01-04 ENCOUNTER — Other Ambulatory Visit (HOSPITAL_COMMUNITY): Payer: 59 | Attending: Psychiatry | Admitting: Psychiatry

## 2014-01-04 VITALS — BP 156/106 | HR 78 | Ht 64.5 in | Wt 179.0 lb

## 2014-01-04 DIAGNOSIS — F332 Major depressive disorder, recurrent severe without psychotic features: Secondary | ICD-10-CM
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
## 2014-01-09 ENCOUNTER — Other Ambulatory Visit (HOSPITAL_COMMUNITY): Payer: Self-pay | Admitting: Psychiatry

## 2014-01-20 ENCOUNTER — Telehealth (HOSPITAL_COMMUNITY): Payer: Self-pay | Admitting: *Deleted

## 2014-01-20 NOTE — Telephone Encounter (Signed)
Called pt to clarify psychotropic medication history. Pt stated she is "very sensitive to medication side effects." Pt stated she has tried various psychotropic medications in the past but had to stop due to intolerable side effects. Pt stated Wellbutrin previously caused irregular heartrate, Prozac caused increased anxiety and agitation, Abilify caused increased anxiety/panic and jitters, and Latuda caused suicidality in the first dose which led to a suicide attempt.

## 2014-02-08 ENCOUNTER — Other Ambulatory Visit (HOSPITAL_COMMUNITY): Payer: Self-pay | Admitting: Psychiatry

## 2014-02-09 ENCOUNTER — Ambulatory Visit (INDEPENDENT_AMBULATORY_CARE_PROVIDER_SITE_OTHER): Payer: 59 | Admitting: Psychiatry

## 2014-02-09 ENCOUNTER — Encounter (HOSPITAL_COMMUNITY): Payer: Self-pay | Admitting: Psychiatry

## 2014-02-09 VITALS — BP 156/114 | HR 96 | Ht 64.5 in | Wt 178.2 lb

## 2014-02-09 DIAGNOSIS — F332 Major depressive disorder, recurrent severe without psychotic features: Secondary | ICD-10-CM

## 2014-02-09 NOTE — Progress Notes (Deleted)
[facility]  02/09/2014 5:12 PM Nancy ChurchKelly Barnes  MRN:  [id number]            Psychiatric Specialty Exam: Physical Exam  ROS  There were no vitals taken for this visit.There is no weight on file to calculate BMI.  General Appearance: {Appearance:22683}  Eye Contact::  {BHH EYE CONTACT:22684}  Speech:  {Speech:22685}  Volume:  {Volume (PAA):22686}  Mood:    Affect:  {Affect (PAA):22687}  Thought Process:  {Thought Process (PAA):22688}  Orientation:  {BHH ORIENTATION (PAA):22689}  Thought Content:  {Thought Content:22690}  Suicidal Thoughts:  {ST/HT (PAA):22692}  Homicidal Thoughts:  {ST/HT (PAA):22692}  Memory:  {BHH MEMORY:22881}  Judgement:  {Judgement (PAA):22694}  Insight:  {Insight (PAA):22695}  Psychomotor Activity:  {Psychomotor (PAA):22696}  Concentration:    Recall:  {BHH GOOD/FAIR/POOR:22877}  Fund of Knowledge:{BHH GOOD/FAIR/POOR:22877}  Language: {BHH GOOD/FAIR/POOR:22877}  Akathisia:  {BHH YES OR NO:22294}  Handed:  {Handed:22697}  AIMS (if indicated):     Assets:  {Assets (PAA):22698}  ADL's:    Cognition: {chl bhh cognition:304700322}  Sleep:        Current Medications:       Nancy Barnes 02/09/2014, 5:12 PM

## 2014-02-09 NOTE — Progress Notes (Signed)
Patient ID: Nancy ChurchKelly Gauthreaux, female   DOB: 10-07-1970, 44 y.o.   MRN: [id number] Spoke with pt during her TMS treatment today. States she is performing poorly at work due to her depression. States she is making errors and is not meeting her productivity goals. Pt and employer feel pt was doing better before and needs to go out on Short term disability. Pt is starting 6 weeks of TMS treatment today. Pt and I agree to begin short term disability today for 6 weeks.

## 2014-02-10 ENCOUNTER — Other Ambulatory Visit (HOSPITAL_COMMUNITY): Payer: 59 | Attending: Psychiatry | Admitting: *Deleted

## 2014-02-10 VITALS — BP 152/98 | HR 90

## 2014-02-10 DIAGNOSIS — F332 Major depressive disorder, recurrent severe without psychotic features: Secondary | ICD-10-CM

## 2014-02-10 DIAGNOSIS — F329 Major depressive disorder, single episode, unspecified: Secondary | ICD-10-CM | POA: Insufficient documentation

## 2014-02-10 NOTE — Progress Notes (Signed)
Patient ID: Nancy Barnes, female   DOB: 03-Feb-1970, 44 y.o.   MRN: [id number] [facility] for Motor Threshold Determination for Repetitive Transcranial Magnetic Stimulation treatment for Major Depressive Disorder. Checked pt's vitals prior to procedure, finding an elevated BP (see vitals). Pt endorsed high level of anxiety due to occupational stress as the cause of her hypertension. Pt reported no change in medication regimen, alcohol/substance use, caffeine consumption, sleep pattern, or metal implant status since previous tx. Pt completed a PHQ-9 with a score of 21 (severe depression). Pt also completed a Beck's Depression Inventory with a score of 28 (moderate depression). Procedure began by placing tx coil on pt's head according to anatomical markers (tragus, body angle alignment) at a superior oblique angle (SOA) of 30 degrees. Titrated power up until motor response was provoked. First motor response occurred at 1.33 SMT. Began anterior/posterior (A/P) hunt for most pronounced motor response. Applied pulses between 3.5 and 7.0 cm in 0.5 cm increments, at SOA of 30 degrees and power of 1.33 SMT. Multiple strong responses noted, so power was reduced to 1.23 SMT. Searched again from A/P distances of 3.5 cm to 5.5 cm, with SOA remaining constant at 30 degrees. Most pronounced response found at 5.0 cm. Utilized MT assist algorithm to calculate Motor Threshold. Motor Threshold was calculated at 1.24 SMT. Per these findings, pt's tx parameters are as follows: A/P -- 10.5 cm, SOA -- 30 degrees, coil angle -- 0 degrees. Pt will receive standard course of HF-rTMS with treatments on the left side, 3000 pulses per session, 10 pulses per second, 4 seconds of stimulation, 26 second intervals between stimulation, and stimulation occurring at 120% MT. Pt received her first session of treatment after Motor Threshold Determination procedure. Pt tolerated tx well. Pt verbalized  significant discomfort after beginning treatment, which was mitigated by increasing the coil angle to +5 degrees. All other coil location parameters remained constant. %MTwas  titrated up to 100%. Will attempt to titrate up to 120% at the following tx session. Pt endorsed a mild headache after treatment, but she said this had already begun prior to the start of tx today. Checked BP again after tx, but pt remained hypertensive (see vitals). In response to this, staff encouraged pt to report to the nearest ED if she begins to experience any unusual symptoms. Pt said she would. Will report these findings to MD. Pt departed from clinic without issue  TMS mapping completed Nelly RoutKUMAR,ARCHANA, MD.

## 2014-02-10 NOTE — Progress Notes (Addendum)
Patient ID: Nancy Barnes, female   DOB: 09/14/1970, 44 y.o.   MRN: [id number] Pt [facility] for Repetitve Transcranial Magnetic Stimulation treatment for Major Depressive Disorder. Pt again exhibiting hypertension today (see vitals). Pt reported no change in medication regimen, alcohol/substance use, caffeine consumption, or metal implant status since previous tx. Pt stated she had a mild headache that was localized to the treatment location after she completed treatment yesterday. Pt reported that this headache resolved on it's own about an hour after she left the clinic. Pt stated that she did not sleep well last night due to anxiety. Pt stated "I just couldn't get things off my mind." Pt reported that she feels highly stressed due to financial concerns, as she has decided to take a leave of absence from her work during her course of TMS treatment, per the suggestion of her boss. Pt tolerated tx well. Pt endorsed significant discomfort in her left eye during pulses, so coil angle was increased to +10 degrees, which mitigated the discomfort to a tolerable level. %MT was titrated up to 115%. Pt reported that the stimulation felt intense at this level, so no further increase was attempted. Will attempt to titrate up to 120% at the next treatment session. Pt with no complaints post-tx. Pt's BP was checked again post-tx, with reading showing increased systolic and slightly decreased diastolic (see vitals). Pt departed from clinic without issue.

## 2014-02-13 ENCOUNTER — Other Ambulatory Visit (HOSPITAL_COMMUNITY): Payer: 59 | Attending: Psychiatry | Admitting: *Deleted

## 2014-02-13 ENCOUNTER — Encounter (HOSPITAL_COMMUNITY): Payer: Self-pay | Admitting: Psychiatry

## 2014-02-13 VITALS — BP 142/92 | HR 92

## 2014-02-13 DIAGNOSIS — F419 Anxiety disorder, unspecified: Secondary | ICD-10-CM | POA: Diagnosis not present

## 2014-02-13 DIAGNOSIS — I10 Essential (primary) hypertension: Secondary | ICD-10-CM | POA: Insufficient documentation

## 2014-02-13 DIAGNOSIS — J45909 Unspecified asthma, uncomplicated: Secondary | ICD-10-CM | POA: Diagnosis not present

## 2014-02-13 DIAGNOSIS — F332 Major depressive disorder, recurrent severe without psychotic features: Secondary | ICD-10-CM | POA: Insufficient documentation

## 2014-02-13 NOTE — Progress Notes (Signed)
Patient ID: Nancy Barnes, female   DOB: [birth year], 44 y.o.   MRN: [id number] Pt [facility] for Repetitive Transcranial Magnetic Stimulation treatment for Major Depressive Disorder. Notified MD of pt's persistent high blood pressure. MD instructed pt to take one dose of her PRN Vistaril for anxiety prior to reporting to clinic for tx. MD informed pt that if this does not resolve the issue, her hypertension will have to be addressed. Pt understood. Pt reported no change in medication regimen, alcohol/substance use, caffeine consumption, sleep pattern, or metal implant status since previous tx. Pt reported that she had several vivid dreams and nightmares over the weekend, and she is unsure if these were caused by stress or the TMS. Pt also stated that she experienced high levels of irritability on Saturday, but she attributed this to taking a Vistaril PRN anxiety which made her feel groggy. Pt tolerated tx well. %MT titrated up to 120% where it remained for the rest of tx session. Pt with no complaints post-tx. Pt departed from clinic without issue.

## 2014-02-14 ENCOUNTER — Other Ambulatory Visit (INDEPENDENT_AMBULATORY_CARE_PROVIDER_SITE_OTHER): Payer: 59 | Admitting: *Deleted

## 2014-02-14 VITALS — BP 142/96 | HR 76

## 2014-02-14 DIAGNOSIS — F332 Major depressive disorder, recurrent severe without psychotic features: Secondary | ICD-10-CM | POA: Diagnosis not present

## 2014-02-14 NOTE — Progress Notes (Signed)
Patient ID: Nancy ChurchKelly Strada, female   DOB: 10/11/1970, 44 y.o.   MRN: [id number] [facility] for Repetitive Transcranial Magnetic Stimulation treatment for Major Depressive Disorder. Pt reported no change in medication regimen, alcohol/substance use, caffeine consumption, sleep pattern, or metal implant status since previous tx. Pt took Vistaril 10 mg PRN anxiety prior to tx today, per doctor's instructions given the previous day. Pt tolerated tx well. %MT remained at 120% for the duration of tx. Pt with no complaints post-tx. Pt departed from clinic without issue.

## 2014-02-15 ENCOUNTER — Other Ambulatory Visit (INDEPENDENT_AMBULATORY_CARE_PROVIDER_SITE_OTHER): Payer: 59 | Admitting: *Deleted

## 2014-02-15 VITALS — BP 160/104 | HR 76

## 2014-02-15 DIAGNOSIS — F332 Major depressive disorder, recurrent severe without psychotic features: Secondary | ICD-10-CM

## 2014-02-15 NOTE — Progress Notes (Signed)
Patient ID: Nancy Barnes, female   DOB: 09-Nov-1970, 44 y.o.   MRN: [id number] [facility] for Repetitive Transcranial Magnetic Stimulation treatment for Major Depressive Disorder. Pt reported no change in medication regimen, alcohol/substance use, caffeine consumption, sleep pattern, or metal implant status since previous tx. Pt completed a PHQ-9 with a score of 13 (moderate depression). Staff clarified PHQ-9 instructions with the pt. Pt stated she forgot the questions on the PHQ-9 are in reference to the past 2 weeks. Pt completed another PHQ-9 with a score of 18 (moderately severe depression). This score is reduced from her baseline score of 21 on 02/09/14. Further tx is warranted in order to promote further benefit for the pt. Pt tolerated tx well. %MT remained at 120% for the duration of tx. Pt with no complaints post-tx. Pt's blood pressure with high again today. As a result of the pt's protracted hypertension, she stated that she will make an appointment with her PCP. Pt departed from clinic without issue.

## 2014-02-16 ENCOUNTER — Other Ambulatory Visit (INDEPENDENT_AMBULATORY_CARE_PROVIDER_SITE_OTHER): Payer: 59 | Admitting: *Deleted

## 2014-02-16 VITALS — BP 152/102 | HR 76

## 2014-02-16 DIAGNOSIS — F332 Major depressive disorder, recurrent severe without psychotic features: Secondary | ICD-10-CM | POA: Diagnosis not present

## 2014-02-16 NOTE — Progress Notes (Signed)
Patient ID: Nancy Barnes, female   DOB: 13-Dec-1970, 44 y.o.   MRN: [id number] [facility] for Repetitive Transcranial Magnetic Stimulation treatment for Major Depressive Disorder. Pt reported that she had an office visit with her PCP this morning before TMS tx. Pt stated her BP at PCP's office was 126/88. Pt's PCP instructed her to return to PCP office after TMS tx for orthostatic vitals check. Pt continues to be hypertensive in this clinic today (see vitals). Pt reported no change in medication regimen, alcohol/substance use, caffeine consumption, sleep pattern, or metal implant status since previous tx. Pt reported that she did not experience vivid dreams or nightmare last night for the first time since beginning TMS tx. Pt tolerated tx well. Pt reported that the stimulation felt more intense today, but it was tolerable. Tx parameters verified -- all normal. Proceeded with tx. By the end of tx session, pt reported that she could "feel a headache coming on."  Staff assured pt that this is a common side effect of TMS. %MT remained at 120% for the duration of tx. Pt with no complaints post-tx. Pt departed from clinic without issue.

## 2014-02-17 ENCOUNTER — Other Ambulatory Visit (INDEPENDENT_AMBULATORY_CARE_PROVIDER_SITE_OTHER): Payer: 59 | Admitting: *Deleted

## 2014-02-17 VITALS — BP 151/97 | HR 89

## 2014-02-17 DIAGNOSIS — F332 Major depressive disorder, recurrent severe without psychotic features: Secondary | ICD-10-CM

## 2014-02-17 NOTE — Progress Notes (Signed)
Patient ID: Nancy Barnes, female   DOB: September 25, 1970, 44 y.o.   MRN: [id number] Pt [facility] for Repetitive Transcranial Magnetic Stimulation treatment for Major Depressive Disorder. Pt reported no change in alcohol/substance use, caffeine consumption, sleep pattern, or metal implant status since previous tx. Due to pt's hypertension, pt's PCP changed her antihypertensive to lisinopril-hydrochlorothiazide 20-12.5 mg. Pt took her first dose this morning. Pt tolerated tx well. %MT remained at 120% for the duration of tx. Pt with no complaints post-tx. Pt departed from clinic without issue.

## 2014-02-20 ENCOUNTER — Other Ambulatory Visit (INDEPENDENT_AMBULATORY_CARE_PROVIDER_SITE_OTHER): Payer: 59 | Admitting: *Deleted

## 2014-02-20 VITALS — BP 134/99 | HR 81

## 2014-02-20 DIAGNOSIS — F332 Major depressive disorder, recurrent severe without psychotic features: Secondary | ICD-10-CM | POA: Diagnosis not present

## 2014-02-20 NOTE — Progress Notes (Signed)
Patient ID: Doristine ChurchKelly Bettcher, female   DOB: [birth year], 44 y.o.   MRN: [id number] [facility] Repetitive Transcranial Magnetic Stimulation treatment for Major Depressive Disorder. Pt reported no change in medication regimen, alcohol/substance use, caffeine consumption, sleep pattern, or metal implant status since previous tx. Pt reported that she had a mild headache after her previous tx session. Pt stated that she was unsure if this was caused by TMS or her change in BP medication. Pt tolerated tx well. %MT remained at 120% for the duration of tx. Pt with no complaints post-tx. Pt departed from clinic without issue.

## 2014-02-21 ENCOUNTER — Other Ambulatory Visit (INDEPENDENT_AMBULATORY_CARE_PROVIDER_SITE_OTHER): Payer: 59 | Admitting: *Deleted

## 2014-02-21 VITALS — BP 160/106 | HR 108 | Ht 64.5 in | Wt 176.8 lb

## 2014-02-21 DIAGNOSIS — F332 Major depressive disorder, recurrent severe without psychotic features: Secondary | ICD-10-CM | POA: Diagnosis not present

## 2014-02-21 NOTE — Progress Notes (Signed)
Patient ID: Nancy Barnes, female   DOB: 05/11/1970, 44 y.o.   MRN: [id number] [facility] for Repetitive Transcranial Magnetic Stimulation treatment for Major Depressive Disorder. Pt reported no change in medication regimen, alcohol/substance use, caffeine consumption, sleep pattern, or metal implant status since previous tx. Pt reported that she had a severe headache yesterday afternoon. Pt is unsure if this headache is associated with TMS tx, as she has a history of headaches during weather fluctuations, and she also recently began a new antihypertensive regimen. Pt continues to present with hypertension pre and post tx. Pt reported that her BP this morning was 115/88. Will notify RN and continue to monitor BP closely during visits. Pt tolerated tx well. %MT remained at 120% for the duration of tx. Pt with no complaints post-tx. Pt departed from clinic without issue.

## 2014-02-22 ENCOUNTER — Other Ambulatory Visit (HOSPITAL_COMMUNITY): Payer: 59

## 2014-02-23 ENCOUNTER — Other Ambulatory Visit (INDEPENDENT_AMBULATORY_CARE_PROVIDER_SITE_OTHER): Payer: 59 | Admitting: *Deleted

## 2014-02-23 VITALS — BP 126/96 | HR 98

## 2014-02-23 DIAGNOSIS — F332 Major depressive disorder, recurrent severe without psychotic features: Secondary | ICD-10-CM

## 2014-02-23 NOTE — Progress Notes (Signed)
Patient ID: Nancy ChurchKelly Shiffman, female   DOB: Sep 05, 1970, 44 y.o.   MRN: [id number] [facility] for Repetitive Transcranial Magnetic Stimulation treatment for Major Depressive Disorder. Pt reported no change in medication regimen, alcohol/substance use, caffeine consumption, sleep pattern, or metal implant status since previous tx. Pt reported she does not feel well today due to her blood pressure and pulse fluctuating, per multiple checks on her home equipment. Pt also reported she has a headache today, generalized "all over" my head. Pt rated pain 3-4 out of 10, with 0 being none and 10 being the worst. Writer administered a PHQ-9 -- pt scored a 13 (moderate depression). This is decreased from her previous score of 18 taken on 02/15/14. Further tx is warranted, as pt seems to be responding to TMS tx. Pt tolerated tx well. %MT remained at 120% for the duration of tx. Pt with no complaints post-tx. Pt departed from clinic without issue.

## 2014-02-24 ENCOUNTER — Other Ambulatory Visit (INDEPENDENT_AMBULATORY_CARE_PROVIDER_SITE_OTHER): Payer: 59 | Admitting: *Deleted

## 2014-02-24 VITALS — BP 136/108 | HR 96

## 2014-02-24 DIAGNOSIS — F332 Major depressive disorder, recurrent severe without psychotic features: Secondary | ICD-10-CM | POA: Diagnosis not present

## 2014-02-24 NOTE — Progress Notes (Signed)
Patient ID: Nancy Barnes, female   DOB: [birth year], 44 y.o.   MRN: [id number] [facility] for Repetitive Transcranial Magnetic Stimulation treatment for Major Depressive Disorder. Pt reported no change in medication regimen, alcohol/substance use, caffeine consumption, sleep pattern, or metal implant status since previous tx. Pt presented with brighter affect than normal today, conversing with Clinical research associatewriter for the entire tx session and often laughing appropriately. Pt tolerated tx well. %MT remained at 120% for the duration of tx. Pt with no complaints post-tx. Pt departed from clinic without issue.

## 2014-02-27 ENCOUNTER — Other Ambulatory Visit (HOSPITAL_COMMUNITY): Payer: 59

## 2014-02-28 ENCOUNTER — Other Ambulatory Visit (INDEPENDENT_AMBULATORY_CARE_PROVIDER_SITE_OTHER): Payer: 59 | Admitting: *Deleted

## 2014-02-28 VITALS — BP 140/100 | HR 100

## 2014-02-28 DIAGNOSIS — F332 Major depressive disorder, recurrent severe without psychotic features: Secondary | ICD-10-CM | POA: Diagnosis not present

## 2014-02-28 NOTE — Progress Notes (Signed)
Patient ID: Nancy Barnes, female   DOB: [birth year], 44 y.o.   MRN: [id number] Pt [facility] for Repetitive Transcranial Magnetic Stimulation treatment for Major Depressive Disorder. Pt reported no change in medication regimen, alcohol/substance use, caffeine consumption, sleep pattern, or metal implant status since previous tx. Pt reported that she has been experiencing periods of restlessness which last only a few minutes, happen mostly in the evening/at night before bed, and resolve on their own. Pt also reported she had an elevated level of motivation and energy over the weekend, as evidenced by completing a large amount of household tasks that she had not done in some time, including rearranging furniture. Pt's psychiatrist notified of the pt's episodes of restlessness and increased motivation/energy. Pt tolerated tx well. %MT remained at 120% for the duration of tx. Pt with no complaints post-tx. Pt departed from clinic without issue.

## 2014-03-01 ENCOUNTER — Other Ambulatory Visit (INDEPENDENT_AMBULATORY_CARE_PROVIDER_SITE_OTHER): Payer: 59 | Admitting: *Deleted

## 2014-03-01 VITALS — BP 144/100 | HR 112 | Ht 64.5 in | Wt 175.6 lb

## 2014-03-01 DIAGNOSIS — F332 Major depressive disorder, recurrent severe without psychotic features: Secondary | ICD-10-CM | POA: Diagnosis not present

## 2014-03-01 NOTE — Progress Notes (Signed)
Patient ID: Nancy ChurchKelly Delaurentis, female   DOB: 1970-09-15, 44 y.o.   MRN: [id number] [facility] for Repetitive Transcranial Magnetic Stimulation treatment for Major Depressive Disorder. Pt accompanied by her son and daughter for tx. Pt reported no change in medication regimen, alcohol/substance use, caffeine consumption, sleep pattern, or metal implant status since previous tx. Pt's daughter provided collateral information re: the pt's progress, stating "she laughs a lot more now and doesn't stress out as much as she used to." Pt completed a PHQ-9 with a score of 11 (moderate depression), which is decreased from her previous score of 13. Further tx is warranted, as pt continues to benefit from TMS therapy. Pt tolerated tx well. %MT remained at 120% for the duration of tx. Pt with no complaints post-tx. Pt and her children departed from clinic without issue.

## 2014-03-02 ENCOUNTER — Other Ambulatory Visit (INDEPENDENT_AMBULATORY_CARE_PROVIDER_SITE_OTHER): Payer: 59 | Admitting: *Deleted

## 2014-03-02 VITALS — BP 138/102 | HR 100

## 2014-03-02 DIAGNOSIS — F332 Major depressive disorder, recurrent severe without psychotic features: Secondary | ICD-10-CM | POA: Diagnosis not present

## 2014-03-02 NOTE — Progress Notes (Signed)
Patient ID: Nancy ChurchKelly Elko, female   DOB: [birth year], 44 y.o.   MRN: [id number] [facility] Repetitive Transcranial Magnetic Stimulation treatment for Major Depressive Disorder. Pt reported no change in medication regimen, alcohol/substance use, caffeine consumption, or metal implant status since previous tx. Pt reported she has only gotten approximately 6 hours of sleep the past 2 nights, whereas she normally sleeps much more. Pt attributes this loss of sleep to various environmental factors. Pt tolerated tx well. %MT remained at 120% for the duration of tx. Pt with no complaints post-tx. Checked pt's vitals post tx and notified MD that pt remains consistently hypertensive. MD instructed pt to follow up with PCP re: this issue. Pt said she would. Pt departed from clinic without issue.

## 2014-03-03 ENCOUNTER — Other Ambulatory Visit (INDEPENDENT_AMBULATORY_CARE_PROVIDER_SITE_OTHER): Payer: 59 | Admitting: *Deleted

## 2014-03-03 VITALS — BP 124/90 | HR 96

## 2014-03-03 DIAGNOSIS — F332 Major depressive disorder, recurrent severe without psychotic features: Secondary | ICD-10-CM

## 2014-03-03 NOTE — Progress Notes (Signed)
Patient ID: Nancy Barnes, female   DOB: 1971-01-01, 44 y.o.   MRN: [id number] [facility] for Repetitive Transcranial Magnetic Stimulation treatment for Major Depressive Disorder. Pt accompanied by her son for tx. Pt reported no change in medication regimen, alcohol/substance use, caffeine consumption, sleep pattern, or metal implant status since previous tx. Pt stated she has felt "mellow" all day today. Pt attributes this to taking Vistaril 25 mg last night qhs. Pt tolerated tx well. %MT remained at 120% for the duration of tx. Pt with no complaints post-tx. Pt and her son departed from clinic without issue.

## 2014-03-06 ENCOUNTER — Other Ambulatory Visit (INDEPENDENT_AMBULATORY_CARE_PROVIDER_SITE_OTHER): Payer: 59 | Admitting: *Deleted

## 2014-03-06 VITALS — BP 124/88 | HR 96

## 2014-03-06 DIAGNOSIS — F332 Major depressive disorder, recurrent severe without psychotic features: Secondary | ICD-10-CM

## 2014-03-06 NOTE — Progress Notes (Signed)
Patient ID: Nancy ChurchKelly Rheaume, female   DOB: March 04, 1970, 44 y.o.   MRN: [id number] Pt [facility] for Repetitive Transcranial Magnetic Stimulation treatment for Major Depressive Disorder. Pt reported no change in medication regimen, alcohol/substance use, caffeine consumption, sleep pattern, or metal implant status since previous tx. Pt reported that she has a headache today in the frontal region of her head. Pt attributes her headache to "the change in the weather." Pt tolerated tx well. %MT remained at 120% for the duration of tx. Pt with no complaints post-tx. Pt departed from clinic without issue.

## 2014-03-07 ENCOUNTER — Encounter (HOSPITAL_COMMUNITY): Payer: Self-pay | Admitting: Psychiatry

## 2014-03-07 ENCOUNTER — Other Ambulatory Visit (INDEPENDENT_AMBULATORY_CARE_PROVIDER_SITE_OTHER): Payer: 59 | Admitting: *Deleted

## 2014-03-07 ENCOUNTER — Ambulatory Visit (INDEPENDENT_AMBULATORY_CARE_PROVIDER_SITE_OTHER): Payer: 59 | Admitting: Psychiatry

## 2014-03-07 VITALS — BP 151/103 | HR 80 | Ht 64.5 in | Wt 177.6 lb

## 2014-03-07 VITALS — BP 144/100 | HR 84

## 2014-03-07 DIAGNOSIS — F411 Generalized anxiety disorder: Secondary | ICD-10-CM

## 2014-03-07 DIAGNOSIS — F331 Major depressive disorder, recurrent, moderate: Secondary | ICD-10-CM

## 2014-03-07 DIAGNOSIS — F332 Major depressive disorder, recurrent severe without psychotic features: Secondary | ICD-10-CM | POA: Diagnosis not present

## 2014-03-07 MED ORDER — VENLAFAXINE HCL ER 75 MG PO CP24: ORAL_CAPSULE | ORAL | Status: DC

## 2014-03-07 MED ORDER — HYDROXYZINE HCL 25 MG PO TABS: 25.0000 mg | ORAL_TABLET | Freq: Three times a day (TID) | ORAL | Status: DC | PRN

## 2014-03-07 NOTE — Progress Notes (Signed)
Patient ID: Nancy ChurchKelly Barnes, female   DOB: [birth year], 44 y.o.   MRN: [id number] [facility] Repetitive Transcranial Magnetic Stimulation treatment for Major Depressive Disorder. Pt reported no change in medication regimen, alcohol/substance use, caffeine consumption, sleep pattern, or metal implant status since previous tx. Pt reported that she has been experiencing increased levels of anxiety due to environmental circumstances. Pt reported that her mood has continued to improve despite the increase in her anxiety. Pt tolerated tx well. %MT remained at 120% for the duration of tx. Pt with no complaints post-tx. Pt departed from clinic without issue.

## 2014-03-07 NOTE — Progress Notes (Signed)
[facility] [address] Progress Note  Nancy Barnes [id number] 44 y.o.  03/07/2014 4:38 PM  Chief Complaint: doing better  History of Present Illness: Pt seen today while on TMS (session #17). Pt is tolerating the treatments without problems.   Overall pt is doing well. Depression is improving. Reports sad mood is slowly improving. Denies isolation, crying spells, worthlessness and low motivation. Anhedonia continues. She has been having insomnia for the last week. Reports she is buying a new mattress as she is having back pain and hopes that improves her sleep. Energy is low but randomly she will get bursts of energy for an hour a few times a week. During this time she will clean but feels easily distracted. Appetite is good.   Family tells her she seems happier and less depressed.  Pt is anxious about work, meds and finances. She has racing thoughts and has a few minor panic attacks.   Pt is taking Effexor as prescribed and denies SE.   Suicidal Ideation: No Plan Formed: No Patient has means to carry out plan: No  Homicidal Ideation: No Plan Formed: No Patient has means to carry out plan: No  Review of Systems: Psychiatric: Agitation: Yes "always" Hallucination: No Depressed Mood: Yes Insomnia: Yes Hypersomnia: No Altered Concentration: Yes Feels Worthless: No Grandiose Ideas: No Belief In Special Powers: No New/Increased Substance Abuse: No Compulsions: No  Neurologic: Headache: No Seizure: No Paresthesias: No  Review of Systems  Musculoskeletal: Positive for back pain. Negative for joint pain and neck pain.  Skin: Positive for rash. Negative for itching.  Neurological: Negative for focal weakness, seizures, loss of consciousness and headaches.  Psychiatric/Behavioral: Positive for depression. Negative for suicidal ideas, hallucinations and substance abuse. The patient is nervous/anxious and has insomnia.      Past Medical Family, Social History: Pt has 2  kids who live at home with her. She is divorced. 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  reports that she has never smoked. She has never used smokeless tobacco. She reports that she does not drink alcohol or use illicit drugs.  Family History  Problem Relation Age of Onset  . Heart attack Mother     Bypass x5  . Allergies Mother   . Diabetes Mellitus II Father   . Hypertension Father   . Allergies Daughter   . Allergies Maternal Grandmother   . Atrial fibrillation Maternal Grandmother   . Stroke Maternal Grandmother   . Suicidality Neg Hx   . Depression Neg Hx   . Anxiety disorder Neg Hx     Past Medical History  Diagnosis Date  . Asthma   . Hypokalemia   . Anxiety   . Depression   . Seasonal allergies   . Environmental allergies     food/medication  . Pneumomediastinum   . Suicidal ideations   . Headache(784.0)   . Hypertension      Outpatient Encounter Prescriptions as of 03/07/2014  Medication Sig  . albuterol (PROVENTIL HFA;VENTOLIN HFA) 108 (90 BASE) MCG/ACT inhaler Inhale 2 puffs into the lungs every 6 (six) hours as needed for wheezing or shortness of breath.  . cetirizine (ZYRTEC) 10 MG tablet Take 1 tablet (10 mg total) by mouth daily.  Marland Kitchen EPINEPHrine (EPIPEN 2-PAK) 0.3 mg/0.3 mL SOAJ injection Use as directed  . EPINEPHrine 0.3 mg/0.3 mL IJ SOAJ injection Inject 0.3 mLs (0.3 mg total) into the muscle  once.  . hydrOXYzine (ATARAX/VISTARIL) 25 MG tablet Take 1 tablet (25 mg total) by mouth 3 (three) times daily as needed for anxiety.  Marland Kitchen ipratropium-albuterol (DUONEB) 0.5-2.5 (3) MG/3ML SOLN Take 3 mLs by nebulization every 6 (six) hours as needed (shortness of breath/wheezing.).  Marland Kitchen lisinopril (PRINIVIL,ZESTRIL) 10 MG tablet Take 1 tablet (10 mg total) by mouth daily.  Marland Kitchen lisinopril (PRINIVIL,ZESTRIL) 20 MG tablet Take 20 mg by mouth daily.  Marland Kitchen  lisinopril-hydrochlorothiazide (PRINZIDE,ZESTORETIC) 20-12.5 MG per tablet Take 1 tablet by mouth daily.  . Magnesium 250 MG TABS Take 1 tablet by mouth every other day.  Marland Kitchen omeprazole (PRILOSEC) 20 MG capsule Take 1 capsule (20 mg total) by mouth daily.  Marland Kitchen Specialty Vitamins Products (MAGNESIUM, AMINO ACID CHELATE,) 133 MG tablet Take 1 tablet by mouth 2 (two) times daily.  Marland Kitchen venlafaxine XR (EFFEXOR-XR) 75 MG 24 hr capsule TAKE 3 CAPSULES (225 MG TOTAL) BY MOUTH DAILY WITH BREAKFAST.    Past Psychiatric History/Hospitalization(s): Anxiety: Yes Bipolar Disorder: No Depression: Yes Mania: No Psychosis: No Schizophrenia: No Personality Disorder: No Hospitalization for psychiatric illness: Yes History of Electroconvulsive Shock Therapy: No Prior Suicide Attempts: Yes  Physical Exam: Constitutional:  BP 151/103 mmHg  Pulse 80  Ht 5' 4.5" (1.638 m)  Wt 177 lb 9.6 oz (80.559 kg)  BMI 30.03 kg/m2  General Appearance: alert, oriented, no acute distress  Musculoskeletal: Strength & Muscle Tone: within normal limits Gait & Station: normal Patient leans: N/A  Mental Status Examination/Evaluation: Objective: Attitude: Calm and cooperative  Appearance: Well Groomed, appears to be stated age  Eye Contact::  Good  Speech:  Clear and Coherent and Normal Rate  Volume:  Normal  Mood:  depressed  Affect:  Full Range- brighter than at previous appts  Thought Process:  Goal Directed, Linear and Logical  Orientation:  Full (Time, Place, and Person)  Thought Content:  Negative  Suicidal Thoughts:  No  Homicidal Thoughts:  No  Judgement:  Good  Insight:  Good  Concentration: good  Memory: Immediate-good Recent-good Remote-good  Recall: fair  Language: fair  Gait and Station: normal  [employer] of Knowledge: average  Psychomotor Activity:  Normal  Akathisia:  No  Handed:  Right  AIMS (if indicated):  n/a  Assets:  Manufacturing systems engineer Desire for Improvement Financial  Resources/Insurance Housing Leisure Time Physical Health Resilience Water engineer (Choose Three): Established Problem, Stable/Improving (1), Review of Psycho-Social Stressors (1) and Review of Medication Regimen & Side Effects (2)  Assessment: Axis I: MDD- recurrent, severe without psychotic features; GAD  Axis II: deferred  Axis III:  Past Medical History  Diagnosis Date  . Asthma   . Hypokalemia   . Anxiety   . Depression   . Seasonal allergies   . Environmental allergies     food/medication  . Pneumomediastinum   . Suicidal ideations   . Headache(784.0)   . Hypertension    Axis IV: poor coping skills, financial stressors  Axis V: GAF 60   Plan:  Medication management with supportive therapy. Risks/benefits and SE of the medication discussed. Pt verbalized understanding and verbal consent obtained for treatment.  Affirm with the patient that the medications are taken as ordered. Patient expressed understanding of how their medications were to be used.  -improvement in symptoms  Vistaril  TID prn anxiety and insomnia Effexor XR to  qD for depression.  Pt could not tolerate Wellbutrin in the past May consider augmenting with Cytomel  Labs: 03/19/13 K low, gluc elevated; UDS negative  Therapy: brief supportive therapy provided. Discussed psychosocial stressors in detail.    Pt denies SI and is at an acute low risk for suicide.Patient told to call clinic if any problems occur. Patient advised to go to ER if they should develop SI/HI, side effects, or if symptoms worsen. Has crisis numbers to call if needed. Pt verbalized understanding.  F/up in 6 weeks or sooner if needed. Will extend FMLA to end of TMS treatment likely ending at end of March or first week of April 2016.    Oletta DarterAGARWAL, Magda Muise, MD 03/07/2014

## 2014-03-08 ENCOUNTER — Other Ambulatory Visit (INDEPENDENT_AMBULATORY_CARE_PROVIDER_SITE_OTHER): Payer: 59 | Admitting: *Deleted

## 2014-03-08 VITALS — BP 118/80 | HR 96 | Ht 64.0 in | Wt 176.4 lb

## 2014-03-08 DIAGNOSIS — F332 Major depressive disorder, recurrent severe without psychotic features: Secondary | ICD-10-CM | POA: Diagnosis not present

## 2014-03-08 NOTE — Progress Notes (Signed)
Patient ID: Nancy ChurchKelly Spallone, female   DOB: 07-30-1970, 44 y.o.   MRN: [id number] Pt [facility] for Repetitive Transcranial Magnetic Stimulation treatment for Major Depressive Disorder. Pt reported no change in medication regimen, alcohol/substance use, caffeine consumption, sleep pattern, or metal implant status since previous tx. Pt reported that she took her Effexor late last night, as she forgot to take it during the day yesterday. Pt reported that she is staggering the tay of day she takes this over the next couple days to compensate for taking it so late yesterday. Pt completed a PHQ-9 with a score of 15 (moderately severe depression). This is increased form the previous week's score of 11. Pt reported that she has been experiencing an increased level of anxiety recently, which has been causing her to feel "down." Pt also completed Beck's Depression Inventory with a score of 23 (moderate depression), which is decreased from her baseline score of 28 taken on 02/09/2014. Pt tolerated tx well. %MT remained at 120% for the duration of tx. Pt with no complaints post-tx. Pt departed from clinic without issue.

## 2014-03-09 ENCOUNTER — Other Ambulatory Visit (INDEPENDENT_AMBULATORY_CARE_PROVIDER_SITE_OTHER): Payer: 59 | Admitting: *Deleted

## 2014-03-09 VITALS — BP 142/94 | HR 83

## 2014-03-09 DIAGNOSIS — F332 Major depressive disorder, recurrent severe without psychotic features: Secondary | ICD-10-CM

## 2014-03-09 NOTE — Progress Notes (Signed)
Patient ID: Nancy ChurchKelly Ibanez, female   DOB: [birth year], 44 y.o.   MRN: [id number] [facility] for Repetitive Transcranial Magnetic Stimulation treatment for Major Depressive Disorder. Pt reported no change in medication regimen, alcohol/substance use, caffeine consumption, sleep pattern, or metal implant status since previous tx. Pt tolerated tx well. %MT remained at 120% for the duration of tx. Pt with no complaints post-tx. Pt departed from clinic without issue.

## 2014-03-10 ENCOUNTER — Other Ambulatory Visit (INDEPENDENT_AMBULATORY_CARE_PROVIDER_SITE_OTHER): Payer: 59 | Admitting: *Deleted

## 2014-03-10 VITALS — BP 119/92 | HR 83

## 2014-03-10 DIAGNOSIS — F332 Major depressive disorder, recurrent severe without psychotic features: Secondary | ICD-10-CM

## 2014-03-10 NOTE — Progress Notes (Signed)
Patient ID: Nancy Barnes, female   DOB: May 02, 1970, 44 y.o.   MRN: [id number] [facility] Repetitive Transcranial Magnetic Stimulation treatment for Major Depressive Disorder. Pt reported no change in medication regimen, alcohol/substance use, caffeine consumption, sleep pattern, or metal implant status since previous tx. Pt tolerated tx well. %MT remained at 120% for the duration of tx. Pt with bright, congruent affect today, laughing and joking appropriately with staffing frequently during tx session. After session was over, pt realized that she had gone throught the entire tx session with no ear plugs.Pt with no complaints post-tx. Pt departed from clinic without issue.

## 2014-03-13 ENCOUNTER — Other Ambulatory Visit (INDEPENDENT_AMBULATORY_CARE_PROVIDER_SITE_OTHER): Payer: 59 | Admitting: *Deleted

## 2014-03-13 VITALS — BP 134/98 | HR 86

## 2014-03-13 DIAGNOSIS — F332 Major depressive disorder, recurrent severe without psychotic features: Secondary | ICD-10-CM

## 2014-03-13 NOTE — Progress Notes (Signed)
Patient ID: Nancy ChurchKelly Coco, female   DOB: [birth year], 44 y.o.   MRN: [id number] [facility] for Repetitive Transcranial Magnetic Stimulation treatment for Major Depressive Disorder. Pt reported no change in medication regimen, alcohol/substance use, caffeine consumption, sleep pattern, or metal implant status since previous tx. Pt brought a movie from home that she watched during tx. Pt remained focused on the movie throughout tx session and laughed appropriately numerous times during the movie. Pt tolerated tx well. %MT remained at 120% for the duration of tx. Pt with no complaints post-tx. Pt departed from clinic without issue.

## 2014-03-14 ENCOUNTER — Other Ambulatory Visit (HOSPITAL_COMMUNITY): Payer: 59 | Attending: Psychiatry | Admitting: *Deleted

## 2014-03-14 VITALS — BP 132/87 | HR 85

## 2014-03-14 DIAGNOSIS — F332 Major depressive disorder, recurrent severe without psychotic features: Secondary | ICD-10-CM | POA: Diagnosis present

## 2014-03-14 NOTE — Progress Notes (Signed)
Patient ID: Nancy Barnes, female   DOB: 09-23-1970, 44 y.o.   MRN: [id number] Pt [facility] for Repetitive Transcranial Magnetic Stimulation treatment for Major Depressive Disorder. Pt reported no change in medication regimen, alcohol/substance use, caffeine consumption, or metal implant status since previous tx. Pt reported that she did not go to bed until around 0300 this morning due to attending a college basketball game with her boyfriend last night. Pt reported that she also slept in this morning as a result. Pt also endorsed increased levels of anxiety due to interpersonal stressors between she and her boyfriend. Pt tolerated tx well. %MT remained at 120% for the duration of tx. Pt with no complaints post-tx. Pt departed from clinic without issue.

## 2014-03-15 ENCOUNTER — Other Ambulatory Visit (INDEPENDENT_AMBULATORY_CARE_PROVIDER_SITE_OTHER): Payer: 59 | Admitting: *Deleted

## 2014-03-15 VITALS — BP 134/93 | HR 85 | Ht 64.0 in | Wt 176.4 lb

## 2014-03-15 DIAGNOSIS — F332 Major depressive disorder, recurrent severe without psychotic features: Secondary | ICD-10-CM

## 2014-03-15 NOTE — Progress Notes (Signed)
Patient ID: Nancy Barnes, female   DOB: 03/25/1970, 44 y.o.   MRN: [id number] [facility] Repetitive Transcranial Magnetic Stimulation treatment for Major Depressive Disorder. Pt reported no change in medication regimen, alcohol/substance use, caffeine consumption, or metal implant status since previous tx. Pt reported that she got a decreased amount of sleep last night due to being unable to get comfortable on her mattress. Pt stated that a new mattress is being delivered today, which she hopes will alleviate the issue. Pt completed a PHQ-9 with a score of 12 (moberate depression). This is decreased from the previous week's score of 15. Pt tolerated tx well. %MT remained at 120% for the duration of tx. Pt with no complaints post-tx. Pt departed from clinic without issue.

## 2014-03-16 ENCOUNTER — Other Ambulatory Visit (INDEPENDENT_AMBULATORY_CARE_PROVIDER_SITE_OTHER): Payer: 59 | Admitting: *Deleted

## 2014-03-16 VITALS — BP 128/91 | HR 81

## 2014-03-16 DIAGNOSIS — F332 Major depressive disorder, recurrent severe without psychotic features: Secondary | ICD-10-CM

## 2014-03-16 NOTE — Progress Notes (Signed)
Patient ID: Nancy Barnes, female   DOB: 12-13-1970, 44 y.o.   MRN: [id number] [facility] Repetitive Transcranial Magnetic Stimulation treatment for Major Depressive Disorder. Pt reported no change in medication regimen, alcohol/substance use, caffeine consumption, sleep pattern, or metal implant status since previous tx. Pt reported that she did not sleep well again last night. Pt tolerated tx well. %MT remained at 120% for the duration of tx. Pt with no complaints post-tx. Pt departed from clinic without issue.

## 2014-03-17 ENCOUNTER — Other Ambulatory Visit (INDEPENDENT_AMBULATORY_CARE_PROVIDER_SITE_OTHER): Payer: 59 | Admitting: *Deleted

## 2014-03-17 VITALS — BP 130/89 | HR 90

## 2014-03-17 DIAGNOSIS — F332 Major depressive disorder, recurrent severe without psychotic features: Secondary | ICD-10-CM

## 2014-03-17 NOTE — Progress Notes (Signed)
Patient ID: Nancy Barnes, female   DOB: Mar 07, 1970, 44 y.o.   MRN: [id number] [facility] for Repetitive Transcranial Magnetic Stimulation treatment for Major Depressive Disorder. Tx attended by Everlena CooperShawn Talyor, RN, as he will be facilitating pt's tx session on Monday 03/20/14. Pt presented with fatigue and lethargia. Pt stated she did not sleep well last night. Pt also complained of muscular twitching and tightness in her occular region, particularly around her left eye. These symptoms discussed with RN. Muscular symptoms likely due to muscular twitching which occurs during stimulation. Will continue to monitor. Pt reported no change in medication regimen, alcohol/substance use, caffeine consumption, sleep pattern, or metal implant status since previous tx. Pt tolerated tx well. %MT remained at 120% for the duration of tx. Pt with no complaints post-tx. Pt departed from clinic without issue.

## 2014-03-20 ENCOUNTER — Other Ambulatory Visit (INDEPENDENT_AMBULATORY_CARE_PROVIDER_SITE_OTHER): Payer: 59 | Admitting: Psychiatry

## 2014-03-20 VITALS — BP 132/90 | HR 68

## 2014-03-20 DIAGNOSIS — F332 Major depressive disorder, recurrent severe without psychotic features: Secondary | ICD-10-CM | POA: Diagnosis not present

## 2014-03-20 NOTE — Progress Notes (Signed)
D. Patient reported to clinic for Transcranial Magnetic Stimulation episode.  Patient presented with appropriate affect, level mood and reported having a good weekend with no symptoms of depression.  Patient reported he eye twitching she reported the previous week was now fine with no pain or discomfort noted since the recent week.  Patient began treatment and had no discomfort throughout.  [facility] set to 120% for the 3000 pulse treatment.  R. Patient tolerated procedure without any reported pain or discomfort.  Reports will return on 03/21/14 for next scheduled session. Patient left clinic without issue and will call if any concerns noted after treatment today.

## 2014-03-21 ENCOUNTER — Other Ambulatory Visit (INDEPENDENT_AMBULATORY_CARE_PROVIDER_SITE_OTHER): Payer: 59

## 2014-03-21 VITALS — BP 124/78 | HR 76

## 2014-03-21 DIAGNOSIS — F332 Major depressive disorder, recurrent severe without psychotic features: Secondary | ICD-10-CM | POA: Diagnosis not present

## 2014-03-21 NOTE — Progress Notes (Signed)
D. [facility] for ordered continuation of  Repetitive Transcranial Magnetic Stimulation treatment for Major Depressive Disorder. Patient presented with appropriate affect, level mood but admitted she was tired today due to not getting home until late the previous night after attending a [employer] [place] game.  Patient reported no further "eye twitching" as had stated she had noticed the pervious week periodically.  Patient reported no change in her current medication regimen, alcohol or substance use, caffeine consumption, sleep pattern, or metal implant status since previous treatment.  A. TMS set to appropriate settings for patient's MT and patient remained at 120% MT for the treatment.  R. Patient tolerated treatment without any complaints of pain or discomfort.  Patient stated feeling "the TMS is helping" and discussed her history of doing well with Pristiq for "a long time but then it just stopped working".  Patient reported no complaints or discomfort after treatment and reported she would return on 03/22/14 for her next scheduled session.  Patient departed clinic without any noted issues.

## 2014-03-22 ENCOUNTER — Other Ambulatory Visit (INDEPENDENT_AMBULATORY_CARE_PROVIDER_SITE_OTHER): Payer: 59

## 2014-03-22 VITALS — BP 127/86 | HR 110

## 2014-03-22 DIAGNOSIS — F332 Major depressive disorder, recurrent severe without psychotic features: Secondary | ICD-10-CM

## 2014-03-22 NOTE — Progress Notes (Deleted)
  D. [facility] for ordered continuation of Repetitive Transcranial Magnetic Stimulation treatment for Major Depressive Disorder. Patient presented with appropriate affect, pleasant mood and reported her pulse was up due to [facility] [place] currently on TV.  BP 127/86 and pulse 111 as patient questioned if the TV could be turned on during her treatment. Patient reported no current symptoms or problems with TMS.  Patient reported no change in her current medication regimen, alcohol or substance use, caffeine consumption, sleep pattern, or metal implant status since previous treatment. A. TMS set to appropriate settings for patient's MT and patient remained at 120% MT for the duration of the treatment. R. Patient tolerated treatment without any complaints of pain or discomfort. Patient stated feeling good after the TMS treatment particularly since [employer] had won their ball game.  Patient reported "my pulse is still up due to how close it was" (pulse 105 at the end of session). Patient reported no complaints or discomfort upon leaving the treatment area and reported she would return on 03/23/14 for her next scheduled session. Patient departed clinic without any noted issues.

## 2014-03-22 NOTE — Progress Notes (Addendum)
D. [facility] for ordered continuation of Repetitive Transcranial Magnetic Stimulation treatment for Major Depressive Disorder. Patient presented with appropriate affect, pleasant mood and reported her pulse was up due to [facility] game currently on TV. BP 127/86 and pulse 111 as patient questioned if the TV could be turned on during her treatment. Patient reported no current symptoms or problems with TMS. Patient reported no change in her current medication regimen, alcohol or substance use, caffeine consumption, sleep pattern, or metal implant status since previous treatment. A. TMS set to appropriate settings for patient's MT and patient remained at 120% MT for the duration of the treatment. R. Patient tolerated treatment without any complaints of pain or discomfort. Patient stated feeling good after the TMS treatment particularly since [employer] had won their ball game. Patient reported "my pulse is still up due to how close it was" (pulse 105 at the end of session). Patient reported no complaints or discomfort upon leaving the treatment area and reported she would return on 03/23/14 for her next scheduled session. Patient departed clinic without any noted issues.  Patient scored a 16 on her PHQ-9 today as stated some continued low interest levels, problems falling to sleep and limited appetite along with continued problems of feeling bad about herself.  Patient denied any current suicidal or homicidal ideations.

## 2014-03-23 ENCOUNTER — Other Ambulatory Visit (INDEPENDENT_AMBULATORY_CARE_PROVIDER_SITE_OTHER): Payer: 59

## 2014-03-23 VITALS — BP 120/86 | HR 88

## 2014-03-23 DIAGNOSIS — F332 Major depressive disorder, recurrent severe without psychotic features: Secondary | ICD-10-CM

## 2014-03-23 NOTE — Progress Notes (Signed)
D. [facility] for ordered continuation of Repetitive Transcranial Magnetic Stimulation treatment for Major Depressive Disorder. Patient presented with appropriate affect, pleasant and level mood and stated feeling good today with no complaints.Patient reported no change in her current medication regimen, alcohol or substance use, caffeine consumption, sleep pattern, or metal implant status since previous treatment. Patient was cooperative and patient while this nurse contacted [facility] regarding treatments as patient is currently on her 29th treatment.  A. TMS set to appropriate settings for patient's MT and patient remained at 120% MT for the duration of the treatment session. R. Patient tolerated treatment without any complaints of pain or discomfort and acknowledge continued improvement in overall mood with TMS. Patient reported no complaints or discomfort post-treatment and reported she would return on 03/24/14 for her next scheduled session. Patient departed clinic without any noted issues.

## 2014-03-24 ENCOUNTER — Other Ambulatory Visit (INDEPENDENT_AMBULATORY_CARE_PROVIDER_SITE_OTHER): Payer: 59

## 2014-03-24 VITALS — BP 122/84 | HR 78

## 2014-03-24 DIAGNOSIS — F332 Major depressive disorder, recurrent severe without psychotic features: Secondary | ICD-10-CM | POA: Diagnosis not present

## 2014-03-24 NOTE — Progress Notes (Signed)
D. [facility] for ordered continuation of Repetitive Transcranial Magnetic Stimulation treatment for Major Depressive Disorder. Patient presented with appropriate affect, pleasant and level mood and stated no complaints today and ready for final scheduled primary treatment session.Patient reported no change in her current medication regimen, alcohol or substance use, caffeine consumption, sleep pattern, or metal implant status since previous treatment. Patient discussed her plans for the weekend and denied any current symptoms of depression A. TMS set to appropriate settings for patient's MT and patient remained at 120% MT for the duration of the treatment session. R. Patient tolerated treatment without any complaints of pain or discomfort and reported no concerns with final primary session.  Patient agreed to set up return in the coming week but will call Scarlette ShortsJosh Barnes on Monday 03/27/14 prior to coming to discuss her final termination sessions. Patient reported no complaints or discomfort following treatment and departed clinic without any noted issues.

## 2014-03-27 ENCOUNTER — Other Ambulatory Visit (INDEPENDENT_AMBULATORY_CARE_PROVIDER_SITE_OTHER): Payer: 59 | Admitting: *Deleted

## 2014-03-27 VITALS — BP 127/85 | HR 83

## 2014-03-27 DIAGNOSIS — F332 Major depressive disorder, recurrent severe without psychotic features: Secondary | ICD-10-CM | POA: Diagnosis not present

## 2014-03-27 NOTE — Progress Notes (Signed)
Patient ID: Nancy Barnes, female   DOB: 09/11/1970, 44 y.o.   MRN: [id number] [facility] for Repetitive Transcranial Magnetic Stimulation treatment for Major Depressive Disorder. Pt's acute course of tx was completed on 03/24/14. Today's session is the first session in the taper-down phase of tx. Pt reported no change in medication regimen, alcohol/substance use, caffeine consumption, sleep pattern, or metal implant status since previous tx. Pt reported increased levels of depression recently due to circumstantial difficulties; namely uncertainty re: her employment. Pt reported that she recently lost a friend unexpectedly in an auto accident. Pt stated she is handling the loss "ok." Pt tolerated tx well. %MT remained at 120% for the duration of tx. Pt with no complaints post-tx. Pt departed from clinic without issue. Pt will return to clinic for the second session in her taper-down phase on 03/29/14.

## 2014-03-28 ENCOUNTER — Other Ambulatory Visit (HOSPITAL_COMMUNITY): Payer: 59

## 2014-03-29 ENCOUNTER — Other Ambulatory Visit (INDEPENDENT_AMBULATORY_CARE_PROVIDER_SITE_OTHER): Payer: 59 | Admitting: *Deleted

## 2014-03-29 VITALS — BP 131/93 | HR 101 | Ht 64.5 in | Wt 178.4 lb

## 2014-03-29 DIAGNOSIS — F332 Major depressive disorder, recurrent severe without psychotic features: Secondary | ICD-10-CM | POA: Diagnosis not present

## 2014-03-29 NOTE — Progress Notes (Signed)
Patient ID: Nancy Barnes, female   DOB: 1970/06/16, 44 y.o.   MRN: [id number] [facility] Repetitive Transcranial Magnetic Stimulation treatment for Major Depressive Disorder. Pt reported no change in medication regimen, alcohol/substance use, caffeine consumption, sleep pattern, or metal implant status since previous tx. Pt completed a PHQ-9 with a score of 6 (mild depression). This is decreased from her score of 16 the previous week. Pt stated she believes her current score is an accurate representation of how she is feeling, although she is feeling high levels of anxiety due to uncertainty re: her employment. Pt tolerated tx well. %MT remained at 120% for the duration of tx. Pt with no complaints post-tx. Pt departed from clinic without issue.

## 2014-03-31 ENCOUNTER — Other Ambulatory Visit (INDEPENDENT_AMBULATORY_CARE_PROVIDER_SITE_OTHER): Payer: 59 | Admitting: *Deleted

## 2014-03-31 VITALS — BP 130/95 | HR 81

## 2014-03-31 DIAGNOSIS — F332 Major depressive disorder, recurrent severe without psychotic features: Secondary | ICD-10-CM

## 2014-03-31 NOTE — Progress Notes (Signed)
Patient ID: Nancy Barnes, female   DOB: 12/19/1970, 44 y.o.   MRN: [id number] Pt [facility] for Repetitive Transcranial Magnetic Stimulation treatment for Major Depressive Disorder. Pt presented with flat affect today. Pt stated "I'm just not having a very good day, that's all." Pt reported no change in medication regimen, alcohol/substance use, caffeine consumption, sleep pattern, or metal implant status since previous tx. Pt somewhat tearful at several times during tx. Pt endorsing severe anxiety resulting from circumstantial stress in her life; i.e. Job uncertainty, uncertainty re: how long TMS benefit will last, etc. Pt tolerated tx well. %MT remained at 120% for the duration of tx. Pt with no complaints post-tx. Pt departed from clinic without issue.

## 2014-04-04 ENCOUNTER — Other Ambulatory Visit (INDEPENDENT_AMBULATORY_CARE_PROVIDER_SITE_OTHER): Payer: 59 | Admitting: *Deleted

## 2014-04-04 VITALS — BP 122/88 | HR 92

## 2014-04-04 DIAGNOSIS — F332 Major depressive disorder, recurrent severe without psychotic features: Secondary | ICD-10-CM

## 2014-04-04 NOTE — Progress Notes (Signed)
Patient ID: Doristine ChurchKelly Barsch, female   DOB: Nov 11, 1970, 44 y.o.   MRN: [id number] Pt reported to [facility] for Repetitive Transcranial Magnetic Stimulation treatment for Major Depressive Disorder. This is a taper down TMS session. Pt reported no change in medication regimen, alcohol/substance use, caffeine consumption, or metal implant status since previous tx. Pt completed a PHQ-9 with a score of 8 (mild depression). This is increased from her score of 6 the previous week, but decreased from her baseline score of 21 (severe). Pt endorsed difficulty sleeping beginning some time last week. Pt also discussed relational issues she is having with her boyfriend, which she states have been causing her a lot of stress. Pt tolerated tx well. %MT remained at 120% for the duration of tx. Pt with no complaints post-tx. Pt departed from clinic without issue.

## 2014-04-06 ENCOUNTER — Other Ambulatory Visit (INDEPENDENT_AMBULATORY_CARE_PROVIDER_SITE_OTHER): Payer: 59 | Admitting: *Deleted

## 2014-04-06 VITALS — BP 127/88 | HR 86

## 2014-04-06 DIAGNOSIS — F332 Major depressive disorder, recurrent severe without psychotic features: Secondary | ICD-10-CM

## 2014-04-06 NOTE — Progress Notes (Signed)
Patient ID: Nancy ChurchKelly Earwood, female   DOB: November 08, 1970, 44 y.o.   MRN: [id number] Pt reported to [facility] for Repetitive Transcranial Magnetic Stimulation treatment for Major Depressive Disorder. Pt presented with bright affect and talkative today. Pt reported no change in medication regimen, alcohol/substance use, caffeine consumption, sleep pattern, or metal implant status since previous tx. Pt tolerated tx well. %MT remained at 120% for the duration of tx. Pt with no complaints post-tx. Pt departed from clinic without issue.

## 2014-04-10 ENCOUNTER — Other Ambulatory Visit (INDEPENDENT_AMBULATORY_CARE_PROVIDER_SITE_OTHER): Payer: 59 | Admitting: *Deleted

## 2014-04-10 VITALS — BP 133/90 | HR 96

## 2014-04-10 DIAGNOSIS — F332 Major depressive disorder, recurrent severe without psychotic features: Secondary | ICD-10-CM

## 2014-04-10 NOTE — Progress Notes (Signed)
Patient ID: Nancy Barnes, female   DOB: 1970-02-14, 44 y.o.   MRN: [id number] Pt [facility] for Repetitive Transcranial Magnetic Stimulation treatment for Major Depressive Disorder. This is the pt's final tx session in her course of TMS tx. Pt reported no change in medication regimen, alcohol/substance use, caffeine consumption, sleep pattern, or metal implant status since previous tx. Pt completed a PHQ-9 with a score of 8 (mild depression). This is no change from the previous week, but it is a 62% reduction from her baseline score of 21 (severe depression). As such, pt is considered to have responded to t TMS tx. Pt also completed a Beck's Depression Inventory with a score of 13 (mild mood disturbance). This is decreased from her previous score of 23 taken on 03/08/2014, and it is also reduced from her baseline score of 28. Pt reported that she feels that she is functioning better emotionally than before she started TMS tx, as evidenced by the way she handles emotional stressors currently in her life, such as relational issues and conflicts. Pt stated, however,  that her anxiety has remained high, which has been an obstacle to her functioning recently. Pt tolerated tx well. %MT remained at 120% for the duration of tx. Pt with no complaints post-tx. Pt departed from clinic without issue. Pt will return to clinic to see Dr. Michae KavaAgarwal for medication management and TMS follow up.

## 2014-05-11 ENCOUNTER — Ambulatory Visit (HOSPITAL_COMMUNITY): Payer: 59 | Admitting: Psychiatry

## 2014-05-12 ENCOUNTER — Other Ambulatory Visit (HOSPITAL_COMMUNITY): Payer: Self-pay | Admitting: Psychiatry

## 2014-05-18 ENCOUNTER — Encounter (HOSPITAL_COMMUNITY): Payer: Self-pay | Admitting: *Deleted

## 2014-05-18 ENCOUNTER — Encounter (HOSPITAL_COMMUNITY): Payer: 59 | Admitting: Psychiatry

## 2014-05-18 NOTE — Progress Notes (Signed)
This encounter was created in error - please disregard.

## 2014-05-18 NOTE — Progress Notes (Unsigned)
Patient ID: Nancy Barnes, female   DOB: 09-10-1970, 44 y.o.   MRN: [id number] Pt was late for her previously scheduled follow up appointment with Dr. Michae KavaAgarwal for TMS follow up. This appointment was rescheduled. While in clinic, however, pt completed a PHQ-9 and Beck's Depression Inventory for 1 month post-discharge outcome tracking. Pt scored a 5 on the PHQ-9 (mild depression) and a 15 on the Beck's (mild mood disturbance.

## 2014-05-23 ENCOUNTER — Encounter (HOSPITAL_COMMUNITY): Payer: Self-pay | Admitting: Psychiatry

## 2014-05-23 ENCOUNTER — Ambulatory Visit (INDEPENDENT_AMBULATORY_CARE_PROVIDER_SITE_OTHER): Payer: 59 | Admitting: Psychiatry

## 2014-05-23 VITALS — BP 128/90 | HR 100 | Ht 64.5 in | Wt 181.2 lb

## 2014-05-23 DIAGNOSIS — F332 Major depressive disorder, recurrent severe without psychotic features: Secondary | ICD-10-CM

## 2014-05-23 DIAGNOSIS — F411 Generalized anxiety disorder: Secondary | ICD-10-CM | POA: Diagnosis not present

## 2014-05-23 MED ORDER — VENLAFAXINE HCL ER 75 MG PO CP24: ORAL_CAPSULE | ORAL | Status: DC

## 2014-05-23 NOTE — Progress Notes (Signed)
[facility] [id number] Progress Note  Nancy Barnes [id number] 43 y.o.  05/23/2014 4:50 PM  Chief Complaint: doing good  History of Present Illness: Pt seen today for f/up and TMS f/up. Pt completed TMS at the of March 2016. States TMS was very helpful and it was a good experience. No questions or concerns today about TMS.   Overall pt is doing well. Pt is back at work.  Depression is significantly improved. Since completing TMS she has been down about 3-4 times for a few hours. Pt treats it by getting active and doing fun activities. Denies isolation, crying spells, low motivation and anhedonia. Pt is engaging in her hobbies.   Pt is sleeping well and is getting about 7-8 hrs. Energy is good. Appetite is good. Concentration is good and she is productive at home and at work.   Family tells her she seems happier and less depressed.  Anxiety is well controlled and she is letting a lot of stress go. She only had to take Vistaril once.   Pt is taking Effexor as prescribed and denies SE.   Pt is pre menopausal.   Suicidal Ideation: No Plan Formed: No Patient has means to carry out plan: No  Homicidal Ideation: No Plan Formed: No Patient has means to carry out plan: No  Review of Systems: Psychiatric: Agitation: Yes on/off Hallucination: No Depressed Mood: No Insomnia: No Hypersomnia: No Altered Concentration: No Feels Worthless: Yes Grandiose Ideas: No Belief In Special Powers: No New/Increased Substance Abuse: No Compulsions: No  Neurologic: Headache: No Seizure: No Paresthesias: No  Review of Systems  Constitutional: Negative for fever, chills and weight loss.  HENT: Positive for nosebleeds. Negative for congestion, ear pain and sore throat.   Eyes: Negative for blurred vision, double vision and pain.  Respiratory: Positive for cough. Negative for sputum production and shortness of breath.   Cardiovascular: Negative for chest pain, palpitations and leg  swelling.  Gastrointestinal: Negative for heartburn, nausea, vomiting and abdominal pain.  Musculoskeletal: Negative for myalgias, back pain, joint pain and neck pain.  Skin: Negative for itching and rash.  Neurological: Positive for dizziness. Negative for sensory change, seizures, loss of consciousness and headaches.  Psychiatric/Behavioral: Negative for depression, suicidal ideas, hallucinations and substance abuse. The patient is not nervous/anxious and does not have insomnia.      Past Medical Family, Social History: Pt has 2 kids who live at home with her. She is divorced. 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  reports that she has never smoked. She has never used smokeless tobacco. She reports that she does not drink alcohol or use illicit drugs.  Family History  Problem Relation Age of Onset  . Heart attack Mother     Bypass x5  . Allergies Mother   . Diabetes Mellitus II Father   . Hypertension Father   . Allergies Daughter   . Allergies Maternal Grandmother   . Atrial fibrillation Maternal Grandmother   . Stroke Maternal Grandmother   . Suicidality Neg Hx   . Depression Neg Hx   . Anxiety disorder Neg Hx     Past Medical History  Diagnosis Date  . Asthma   . Hypokalemia   . Anxiety   . Depression   . Seasonal allergies   . Environmental allergies     food/medication  . Pneumomediastinum   . Suicidal ideations   . Headache(784.0)   .  Hypertension      Outpatient Encounter Prescriptions as of 05/23/2014  Medication Sig  . albuterol (PROVENTIL HFA;VENTOLIN HFA) 108 (90 BASE) MCG/ACT inhaler Inhale 2 puffs into the lungs every 6 (six) hours as needed for wheezing or shortness of breath.  . cetirizine (ZYRTEC) 10 MG tablet Take 1 tablet (10 mg total) by mouth daily.  Marland Kitchen. EPINEPHrine (EPIPEN 2-PAK) 0.3 mg/0.3 mL SOAJ injection Use as directed  . EPINEPHrine 0.3  mg/0.3 mL IJ SOAJ injection Inject 0.3 mLs (0.3 mg total) into the muscle once.  . hydrOXYzine (ATARAX/VISTARIL) 25 MG tablet Take 1 tablet (25 mg total) by mouth 3 (three) times daily as needed for anxiety.  Marland Kitchen. ipratropium-albuterol (DUONEB) 0.5-2.5 (3) MG/3ML SOLN Take 3 mLs by nebulization every 6 (six) hours as needed (shortness of breath/wheezing.).  Marland Kitchen. lisinopril (PRINIVIL,ZESTRIL) 10 MG tablet Take 1 tablet (10 mg total) by mouth daily.  Marland Kitchen. lisinopril (PRINIVIL,ZESTRIL) 20 MG tablet Take 20 mg by mouth daily.  Marland Kitchen. lisinopril-hydrochlorothiazide (PRINZIDE,ZESTORETIC) 20-12.5 MG per tablet Take 1 tablet by mouth daily.  . Magnesium 250 MG TABS Take 1 tablet by mouth every other day.  Marland Kitchen. omeprazole (PRILOSEC) 20 MG capsule Take 1 capsule (20 mg total) by mouth daily.  Marland Kitchen. Specialty Vitamins Products (MAGNESIUM, AMINO ACID CHELATE,) 133 MG tablet Take 1 tablet by mouth 2 (two) times daily.  Marland Kitchen. terbinafine (LAMISIL) 250 MG tablet Take 250 mg by mouth daily.  Marland Kitchen. venlafaxine XR (EFFEXOR-XR) 75 MG 24 hr capsule TAKE 3 CAPSULES (225 MG TOTAL) BY MOUTH DAILY WITH BREAKFAST.   No facility-administered encounter medications on file as of 05/23/2014.    Past Psychiatric History/Hospitalization(s): Anxiety: Yes Bipolar Disorder: No Depression: Yes Mania: No Psychosis: No Schizophrenia: No Personality Disorder: No Hospitalization for psychiatric illness: Yes History of Electroconvulsive Shock Therapy: No Prior Suicide Attempts: Yes  Physical Exam: Constitutional:  BP 128/90 mmHg  Pulse 100  Ht 5' 4.5" (1.638 m)  Wt 181 lb 3.2 oz (82.192 kg)  BMI 30.63 kg/m2  General Appearance: alert, oriented, no acute distress  Musculoskeletal: Strength & Muscle Tone: within normal limits Gait & Station: normal Patient leans: N/A  Mental Status Examination/Evaluation: Objective: Attitude: Calm and cooperative  Appearance: Well Groomed, appears to be stated age  Eye Contact::  Good  Speech:  Clear and  Coherent and Normal Rate  Volume:  Normal  Mood: euthymic   Affect:  Full Range- brighter than at previous appts  Thought Process:  Goal Directed, Linear and Logical  Orientation:  Full (Time, Place, and Person)  Thought Content:  Negative  Suicidal Thoughts:  No  Homicidal Thoughts:  No  Judgement:  Good  Insight:  Good  Concentration: good  Memory: Immediate-good Recent-good Remote-good  Recall: fair  Language: fair  Gait and Station: normal  [employer] of Knowledge: average  Psychomotor Activity:  Normal  Akathisia:  No  Handed:  Right  AIMS (if indicated):  n/a  Assets:  Manufacturing systems engineerCommunication Skills Desire for Improvement Financial Resources/Insurance Housing Leisure Time Physical Health Resilience Water engineerTalents/Skills Transportation     Medical Decision Making (Choose Three): Established Problem, Stable/Improving (1), Review of Psycho-Social Stressors (1) and Review of Medication Regimen & Side Effects (2)  Assessment: Axis I: MDD- recurrent, severe without psychotic features; GAD  Axis II: deferred  Axis III:  Past Medical History  Diagnosis Date  . Asthma   . Hypokalemia   . Anxiety   . Depression   . Seasonal allergies   . Environmental allergies  food/medication  . Pneumomediastinum   . Suicidal ideations   . Headache(784.0)   . Hypertension    Axis IV: poor coping skills, financial stressors  Axis V: GAF 60   Plan:  Medication management with supportive therapy. Risks/benefits and SE of the medication discussed. Pt verbalized understanding and verbal consent obtained for treatment.  Affirm with the patient that the medications are taken as ordered. Patient expressed understanding of how their medications were to be used.  -improvement in symptoms PHQ (today is 6, 1 week ago it was 5)  Vistaril 25mg  TID prn anxiety and insomnia Effexor XR to 225mg  qD for depression.   Labs: 03/19/13 K low, gluc elevated; UDS negative  Therapy: brief supportive  therapy provided. Discussed psychosocial stressors in detail.    Pt denies SI and is at an acute low risk for suicide.Patient told to call clinic if any problems occur. Patient advised to go to ER if they should develop SI/HI, side effects, or if symptoms worsen. Has crisis numbers to call if needed. Pt verbalized understanding.  F/up in 6 months or sooner if needed.    Oletta DarterAGARWAL, Gerldine Suleiman, MD 05/23/2014

## 2014-08-12 ENCOUNTER — Other Ambulatory Visit (HOSPITAL_COMMUNITY): Payer: Self-pay | Admitting: Psychiatry

## 2014-08-23 ENCOUNTER — Emergency Department (INDEPENDENT_AMBULATORY_CARE_PROVIDER_SITE_OTHER)
Admission: EM | Admit: 2014-08-23 | Discharge: 2014-08-23 | Disposition: A | Discharge status: Home / Self Care | Payer: 59 | Source: Home / Self Care | Attending: Emergency Medicine | Admitting: Emergency Medicine

## 2014-08-23 ENCOUNTER — Encounter (HOSPITAL_COMMUNITY): Payer: Self-pay | Admitting: Emergency Medicine

## 2014-08-23 DIAGNOSIS — M7541 Impingement syndrome of right shoulder: Secondary | ICD-10-CM

## 2014-08-23 DIAGNOSIS — M75101 Unspecified rotator cuff tear or rupture of right shoulder, not specified as traumatic: Secondary | ICD-10-CM

## 2014-08-23 MED ORDER — LIDOCAINE HCL (PF) 2 % IJ SOLN
INTRAMUSCULAR | Status: AC
  Filled 2014-08-23: qty 2

## 2014-08-23 MED ORDER — METHYLPREDNISOLONE ACETATE 40 MG/ML IJ SUSP
INTRAMUSCULAR | Status: AC
  Filled 2014-08-23: qty 1

## 2014-08-23 NOTE — Discharge Instructions (Signed)
We did a cortisone injection today. You should see improvement over the next 24-48 hours. Continue Tylenol as needed. Use the sling for comfort. Make sure you are doing range of motion at least 2-3 times a day. Apply ice to your shoulder as often as you can for the next 2 days. If things are not improving in the next 1-2 weeks, please follow-up with orthopedic doctor.

## 2014-08-23 NOTE — ED Notes (Signed)
Pain in right shoulder , radiating.  Reports limited rom, possible rotator cuff injury per patient.  Onset of pain 5 months ago, progresses in pain

## 2014-08-23 NOTE — ED Provider Notes (Signed)
CSN: [id number]     Arrival date & time 08/23/14  1300 History   First MD Initiated Contact with Patient 08/23/14 1310     Chief Complaint  Patient presents with  . Shoulder Injury   (Consider location/radiation/quality/duration/timing/severity/associated sxs/prior Treatment) HPI  She is a 44 year old woman here for evaluation of right shoulder pain. She states this is been going on for several months, but has become more problematic in the last few weeks. She specifically reports pain with overhead movements, such as doing her hair. She also has difficulty getting into her right back pocket. She denies any swelling. No known injury. She does work at a [employer] for her job. In the last few weeks she has also noticed some pain in the right trapezius muscle as well as some pain in her lower arm. She states her hand will occasionally feel very cold and numb. She tried to see her primary care doctor today, but there was a problem with scheduling. She has been taking Tylenol with some improvement. She is unable to take NSAIDs or prednisone.  Past Medical History  Diagnosis Date  . Asthma   . Hypokalemia   . Anxiety   . Depression   . Seasonal allergies   . Environmental allergies     food/medication  . Pneumomediastinum   . Suicidal ideations   . Headache(784.0)   . Hypertension    Past Surgical History  Procedure Laterality Date  . Tubal ligation    . Ablation    . Breast biopsy  04/2011   Family History  Problem Relation Age of Onset  . Heart attack Mother     Bypass x5  . Allergies Mother   . Diabetes Mellitus II Father   . Hypertension Father   . Allergies Daughter   . Allergies Maternal Grandmother   . Atrial fibrillation Maternal Grandmother   . Stroke Maternal Grandmother   . Suicidality Neg Hx   . Depression Neg Hx   . Anxiety disorder Neg Hx    Social History  Substance Use Topics  . Smoking status: Never Smoker   . Smokeless tobacco: Never Used  . Alcohol Use: No    OB History    No data available     Review of Systems As in history of present illness Allergies  Nsaids; Shellfish allergy; Abilify; Amoxicillin; Fish allergy; Latuda; Levaquin; Other; Penicillins; Prednisone; Seroquel; Sulfa antibiotics; Ativan; and Xanax  Home Medications   Prior to Admission medications   Medication Sig Start Date End Date Taking? Authorizing Provider  albuterol (PROVENTIL HFA;VENTOLIN HFA) 108 (90 BASE) MCG/ACT inhaler Inhale 2 puffs into the lungs every 6 (six) hours as needed for wheezing or shortness of breath. 06/09/13   Waymon Budge, MD  cetirizine (ZYRTEC) 10 MG tablet Take 1 tablet (10 mg total) by mouth daily. 03/26/13   Charm Rings, NP  EPINEPHrine (EPIPEN 2-PAK) 0.3 mg/0.3 mL SOAJ injection Use as directed 03/26/13   Charm Rings, NP  EPINEPHrine 0.3 mg/0.3 mL IJ SOAJ injection Inject 0.3 mLs (0.3 mg total) into the muscle once. 11/18/13   Kristen N Ward, DO  hydrOXYzine (ATARAX/VISTARIL) 25 MG tablet Take 1 tablet (25 mg total) by mouth 3 (three) times daily as needed for anxiety. 03/07/14   [name], MD  ipratropium-albuterol (DUONEB) 0.5-2.5 (3) MG/3ML SOLN Take 3 mLs by nebulization every 6 (six) hours as needed (shortness of breath/wheezing.).    Historical Provider, MD  lisinopril (PRINIVIL,ZESTRIL) 10 MG tablet Take 1 tablet (  10 mg total) by mouth daily. 03/26/13   Charm Rings, NP  lisinopril (PRINIVIL,ZESTRIL) 20 MG tablet Take 20 mg by mouth daily.    Historical Provider, MD  lisinopril-hydrochlorothiazide (PRINZIDE,ZESTORETIC) 20-12.5 MG per tablet Take 1 tablet by mouth daily.    Historical Provider, MD  Magnesium 250 MG TABS Take 1 tablet by mouth every other day.    Historical Provider, MD  omeprazole (PRILOSEC) 20 MG capsule Take 1 capsule (20 mg total) by mouth daily. 03/26/13   Charm Rings, NP  Specialty Vitamins Products (MAGNESIUM, AMINO ACID CHELATE,) 133 MG tablet Take 1 tablet by mouth 2 (two) times daily.    Historical  Provider, MD  terbinafine (LAMISIL) 250 MG tablet Take 250 mg by mouth daily.    Historical Provider, MD  venlafaxine XR (EFFEXOR-XR) 75 MG 24 hr capsule TAKE 3 CAPSULES (225 MG TOTAL) BY MOUTH DAILY WITH BREAKFAST. 05/23/14   [name], MD   BP 136/89 mmHg  Pulse 93  Temp(Src) 98.3 F (36.8 C) (Oral)  Resp 18  SpO2 98% Physical Exam  Constitutional: She is oriented to person, place, and time. She appears well-developed and well-nourished. No distress.  Neck: Neck supple.  No vertebral tenderness.  Cardiovascular: Normal rate.   Pulmonary/Chest: Effort normal.  Musculoskeletal:  Right shoulder: No erythema or edema. She has mild tenderness and spasm in the right superior trapezius. Positive empty can and Hawking's. 2+ radial pulse. Negative Tinel's and Phalen's.  Neurological: She is alert and oriented to person, place, and time.    ED Course  Injection of joint Date/Time: 08/23/2014 1:36 PM Performed by: Charm Rings Authorized by: Charm Rings Consent: Verbal consent obtained. Risks and benefits: risks, benefits and alternatives were discussed Consent given by: patient Patient understanding: patient states understanding of the procedure being performed Patient identity confirmed: verbally with patient Time out: Immediately prior to procedure a "time out" was called to verify the correct patient, procedure, equipment, support staff and site/side marked as required. Comments: Skin was prepped with alcohol. Cold spray was used for anesthesia. 40 mg of Depo-Medrol with 3 mL of 2% lidocaine was injected into the right shoulder. Patient tolerated procedure well without immediate complication.   (including critical care time) Labs Review Labs Reviewed - No data to display  Imaging Review No results found.   MDM   1. Rotator cuff impingement syndrome of right shoulder    Cortisone injection done today. Sling given for comfort. Emphasized importance of range of  motion. Continue Tylenol as needed for discomfort. Recommended frequent icing, especially for the next 2 days. If no improvement in 1-2 weeks, follow-up with orthopedics.    Charm Rings, MD 08/23/14 [phone]

## 2014-08-29 ENCOUNTER — Telehealth (HOSPITAL_COMMUNITY): Payer: Self-pay

## 2014-08-29 NOTE — Telephone Encounter (Signed)
Telephone call with Kathlene November, pharmacist at [facility] in [place] after patient left a message requesting refills on her prescribed Effexor XR  a day.  Verified they had received Dr. Lemar Lofty order from 05/23/14 plus 5 refills and then left patient a message they had the order there still with 3 refills and the prescription would be filled.  Requested patient call back if any further concerns or problems getting medication filled.

## 2014-11-23 ENCOUNTER — Ambulatory Visit (INDEPENDENT_AMBULATORY_CARE_PROVIDER_SITE_OTHER): Payer: Self-pay | Admitting: Psychiatry

## 2014-11-23 ENCOUNTER — Encounter (HOSPITAL_COMMUNITY): Payer: Self-pay | Admitting: Psychiatry

## 2014-11-23 VITALS — BP 129/90 | HR 98 | Ht 64.0 in | Wt 180.4 lb

## 2014-11-23 DIAGNOSIS — F332 Major depressive disorder, recurrent severe without psychotic features: Secondary | ICD-10-CM

## 2014-11-23 DIAGNOSIS — F411 Generalized anxiety disorder: Secondary | ICD-10-CM

## 2014-11-23 MED ORDER — VENLAFAXINE HCL ER 75 MG PO CP24: ORAL_CAPSULE | ORAL | Status: DC

## 2014-11-23 NOTE — Progress Notes (Signed)
Patient ID: Nancy Barnes, female   DOB: [birth year], 44 y.o.   MRN: [id number]  [facility] [id number] Progress Note  Kaegan Stigler [id number] 44 y.o.  11/23/2014 4:24 PM  Chief Complaint: doing good  History of Present Illness: Overall pt is doing well. Pt is working but looking for a new job. Things at home are good. Pt is working with her significant other to overcome his addiction issues. Pt is making herself a priority.  NO concerns today.   Pt denies depression. Denies anhedonia, isolation, crying spells, low motivation, poor hygiene, worthlessness and hopelessness. Denies SI/HI. Denies AVH. Pt is engaging in her hobbies.   Pt is sleeping well and is getting about 7-8 hrs. Energy is good. Appetite is good. Concentration is good and she is productive at home and at work.   Anxiety is always present but it tolerable. She had a panic attack today but it was the first in several months.  She only had to take Vistaril once today.   Pt is taking Effexor as prescribed and denies SE.   Suicidal Ideation: No Plan Formed: No Patient has means to carry out plan: No  Homicidal Ideation: No Plan Formed: No Patient has means to carry out plan: No  Review of Systems: Psychiatric: Agitation: No  Hallucination: No Depressed Mood: No Insomnia: No Hypersomnia: No Altered Concentration: No Feels Worthless: Yes Grandiose Ideas: No Belief In Special Powers: No New/Increased Substance Abuse: No Compulsions: No  Neurologic: Headache: No Seizure: No Paresthesias: No  Review of Systems  Constitutional: Negative for fever, chills and weight loss.  HENT: Positive for nosebleeds. Negative for congestion, ear pain and sore throat.   Eyes: Negative for blurred vision, double vision and pain.  Respiratory: Positive for cough. Negative for sputum production and shortness of breath.   Cardiovascular: Negative for chest pain, palpitations and leg swelling.  Gastrointestinal: Negative for  heartburn, nausea, vomiting and abdominal pain.  Musculoskeletal: Negative for myalgias, back pain, joint pain and neck pain.  Skin: Negative for itching and rash.  Neurological: Positive for dizziness. Negative for sensory change, seizures, loss of consciousness and headaches.  Psychiatric/Behavioral: Negative for depression, suicidal ideas, hallucinations and substance abuse. The patient is not nervous/anxious and does not have insomnia.      Past Medical Family, Social History: Pt has 2 kids who live at home with her. She is divorced. 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  reports that she has never smoked. She has never used smokeless tobacco. She reports that she does not drink alcohol or use illicit drugs.  Family History  Problem Relation Age of Onset  . Heart attack Mother     Bypass x5  . Allergies Mother   . Diabetes Mellitus II Father   . Hypertension Father   . Allergies Daughter   . Allergies Maternal Grandmother   . Atrial fibrillation Maternal Grandmother   . Stroke Maternal Grandmother   . Suicidality Neg Hx   . Depression Neg Hx   . Anxiety disorder Neg Hx     Past Medical History  Diagnosis Date  . Asthma   . Hypokalemia   . Anxiety   . Depression   . Seasonal allergies   . Environmental allergies     food/medication  . Pneumomediastinum (HCC)   . Suicidal ideations   . Headache(784.0)   . Hypertension      Outpatient  Encounter Prescriptions as of 11/23/2014  Medication Sig  . albuterol (PROVENTIL HFA;VENTOLIN HFA) 108 (90 BASE) MCG/ACT inhaler Inhale 2 puffs into the lungs every 6 (six) hours as needed for wheezing or shortness of breath.  . cetirizine (ZYRTEC) 10 MG tablet Take 1 tablet (10 mg total) by mouth daily.  Marland Kitchen. EPINEPHrine (EPIPEN 2-PAK) 0.3 mg/0.3 mL SOAJ injection Use as directed  . EPINEPHrine 0.3 mg/0.3 mL IJ SOAJ injection Inject 0.3  mLs (0.3 mg total) into the muscle once.  . hydrOXYzine (ATARAX/VISTARIL) 25 MG tablet Take 1 tablet (25 mg total) by mouth 3 (three) times daily as needed for anxiety.  Marland Kitchen. ipratropium-albuterol (DUONEB) 0.5-2.5 (3) MG/3ML SOLN Take 3 mLs by nebulization every 6 (six) hours as needed (shortness of breath/wheezing.).  Marland Kitchen. lisinopril (PRINIVIL,ZESTRIL) 10 MG tablet Take 1 tablet (10 mg total) by mouth daily.  Marland Kitchen. lisinopril (PRINIVIL,ZESTRIL) 20 MG tablet Take 20 mg by mouth daily.  Marland Kitchen. lisinopril-hydrochlorothiazide (PRINZIDE,ZESTORETIC) 20-12.5 MG per tablet Take 1 tablet by mouth daily.  . Magnesium 250 MG TABS Take 1 tablet by mouth every other day.  Marland Kitchen. omeprazole (PRILOSEC) 20 MG capsule Take 1 capsule (20 mg total) by mouth daily.  Marland Kitchen. Specialty Vitamins Products (MAGNESIUM, AMINO ACID CHELATE,) 133 MG tablet Take 1 tablet by mouth 2 (two) times daily.  Marland Kitchen. terbinafine (LAMISIL) 250 MG tablet Take 250 mg by mouth daily.  Marland Kitchen. venlafaxine XR (EFFEXOR-XR) 75 MG 24 hr capsule TAKE 3 CAPSULES (225 MG TOTAL) BY MOUTH DAILY WITH BREAKFAST.   No facility-administered encounter medications on file as of 11/23/2014.    Past Psychiatric History/Hospitalization(s): Anxiety: Yes Bipolar Disorder: No Depression: Yes Mania: No Psychosis: No Schizophrenia: No Personality Disorder: No Hospitalization for psychiatric illness: Yes History of Electroconvulsive Shock Therapy: No Prior Suicide Attempts: Yes  Physical Exam: Constitutional:  BP 129/90 mmHg  Pulse 98  Ht 5\' 4"  (1.626 m)  Wt 180 lb 6.4 oz (81.829 kg)  BMI 30.95 kg/m2  General Appearance: alert, oriented, no acute distress  Musculoskeletal: Strength & Muscle Tone: within normal limits Gait & Station: normal Patient leans: N/A  Mental Status Examination/Evaluation: Objective: Attitude: Calm and cooperative  Appearance: Well Groomed, appears to be stated age  Eye Contact::  Good  Speech:  Clear and Coherent and Normal Rate  Volume:  Normal   Mood: euthymic   Affect:  Full Range  Thought Process:  Goal Directed, Linear and Logical  Orientation:  Full (Time, Place, and Person)  Thought Content:  Negative  Suicidal Thoughts:  No  Homicidal Thoughts:  No  Judgement:  Good  Insight:  Good  Concentration: good  Memory: Immediate-good Recent-good Remote-good  Recall: fair  Language: fair  Gait and Station: normal  [employer] of Knowledge: average  Psychomotor Activity:  Normal  Akathisia:  No  Handed:  Right  AIMS (if indicated):  n/a  Assets:  Manufacturing systems engineerCommunication Skills Desire for Improvement Financial Resources/Insurance Housing Leisure Time Physical Health Resilience Water engineerTalents/Skills Transportation     Medical Decision Making (Choose Three): Established Problem, Stable/Improving (1), Review of Psycho-Social Stressors (1) and Review of Medication Regimen & Side Effects (2)  Assessment: Axis I: MDD- recurrent, severe without psychotic features; GAD  Axis II: deferred  Axis III:  Past Medical History  Diagnosis Date  . Asthma   . Hypokalemia   . Anxiety   . Depression   . Seasonal allergies   . Environmental allergies     food/medication  . Pneumomediastinum (HCC)   . Suicidal ideations   .  Headache(784.0)   . Hypertension    Axis IV: poor coping skills, financial stressors  Axis V: GAF 60   Plan:  Medication management with supportive therapy. Risks/benefits and SE of the medication discussed. Pt verbalized understanding and verbal consent obtained for treatment.  Affirm with the patient that the medications are taken as ordered. Patient expressed understanding of how their medications were to be used.  -improvement in symptoms   Vistaril  TID prn anxiety and insomnia Effexor XR to  qD for depression. Denies any SE and adverse reaction.   Labs: 03/19/13 K low, gluc elevated; UDS negative  Therapy: brief supportive therapy provided. Discussed psychosocial stressors in detail.    Pt  denies SI and is at an acute low risk for suicide.Patient told to call clinic if any problems occur. Patient advised to go to ER if they should develop SI/HI, side effects, or if symptoms worsen. Has crisis numbers to call if needed. Pt verbalized understanding.  F/up in 4 months or sooner if needed.    [name], MD 11/23/2014

## 2015-03-22 ENCOUNTER — Ambulatory Visit (INDEPENDENT_AMBULATORY_CARE_PROVIDER_SITE_OTHER): Payer: Self-pay | Admitting: Psychiatry

## 2015-03-22 ENCOUNTER — Encounter (HOSPITAL_COMMUNITY): Payer: Self-pay | Admitting: Psychiatry

## 2015-03-22 VITALS — BP 110/68 | HR 84 | Ht 64.5 in | Wt 173.8 lb

## 2015-03-22 DIAGNOSIS — F332 Major depressive disorder, recurrent severe without psychotic features: Secondary | ICD-10-CM

## 2015-03-22 DIAGNOSIS — F411 Generalized anxiety disorder: Secondary | ICD-10-CM

## 2015-03-22 MED ORDER — VENLAFAXINE HCL ER 75 MG PO CP24: ORAL_CAPSULE | ORAL | Status: DC

## 2015-03-22 NOTE — Progress Notes (Signed)
[facility] [id number] Progress Note  Doristine ChurchKelly Vought [id number] 45 y.o.  03/22/2015 8:46 AM  Chief Complaint: "doing good"  History of Present Illness: Overall pt is doing well. Pt is working but looking for a new job. No longer working at [employer] and is happy about it. Things at home are good. Pt broke up wit her boyfriend due to his addiction issues. Pt is dating someone new. Pt is making herself a priority.  No concerns today.   Pt denies depression. Denies anhedonia, isolation, crying spells, low motivation, poor hygiene, worthlessness and hopelessness.  Denies AVH. Pt is engaging in her hobbies.   Pt is sleeping well and is getting about 7-8 hrs. Energy is good. Appetite is good. Concentration is good and she is productive at home and at work.   Anxiety is always present but it mild and tolerable. She had one panic attack last week but it was the first in several months.  Pt only takes Vistaril when having panic attacks.   Pt is taking Effexor as prescribed and denies SE.   Suicidal Ideation: No Plan Formed: No Patient has means to carry out plan: No  Homicidal Ideation: No Plan Formed: No Patient has means to carry out plan: No  Review of Systems: Psychiatric: Agitation: No  Hallucination: No Depressed Mood: No Insomnia: No Hypersomnia: No Altered Concentration: No Feels Worthless: Yes Grandiose Ideas: No Belief In Special Powers: No New/Increased Substance Abuse: No Compulsions: No  Neurologic: Headache: No Seizure: No Paresthesias: No  Review of Systems  Constitutional: Negative for fever, chills and weight loss.  HENT: Positive for congestion. Negative for ear pain, nosebleeds and sore throat.   Eyes: Negative for blurred vision, double vision and pain.  Respiratory: Negative for cough, sputum production and shortness of breath.   Cardiovascular: Negative for chest pain, palpitations and leg swelling.  Gastrointestinal: Negative for  heartburn, nausea, vomiting and abdominal pain.  Musculoskeletal: Negative for myalgias, back pain, joint pain and neck pain.  Skin: Negative for itching and rash.  Neurological: Negative for dizziness, sensory change, seizures, loss of consciousness and headaches.  Psychiatric/Behavioral: Negative for depression, suicidal ideas, hallucinations and substance abuse. The patient is not nervous/anxious and does not have insomnia.      Past Medical Family, Social History: Pt has 2 kids who live at home with her. She is divorced. 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  reports that she has never smoked. She has never used smokeless tobacco. She reports that she does not drink alcohol or use illicit drugs.  Family History  Problem Relation Age of Onset  . Heart attack Mother     Bypass x5  . Allergies Mother   . Diabetes Mellitus II Father   . Hypertension Father   . Allergies Daughter   . Allergies Maternal Grandmother   . Atrial fibrillation Maternal Grandmother   . Stroke Maternal Grandmother   . Suicidality Neg Hx   . Depression Neg Hx   . Anxiety disorder Neg Hx     Past Medical History  Diagnosis Date  . Asthma   . Hypokalemia   . Anxiety   . Depression   . Seasonal allergies   . Environmental allergies     food/medication  . Pneumomediastinum (HCC)   . Suicidal ideations   . Headache(784.0)   . Hypertension      Outpatient Encounter Prescriptions as of 03/22/2015  Medication Sig  . albuterol (PROVENTIL HFA;VENTOLIN HFA) 108 (90 BASE) MCG/ACT inhaler Inhale 2 puffs into the lungs every 6 (six) hours as needed for wheezing or shortness of breath.  . cetirizine (ZYRTEC) 10 MG tablet Take 1 tablet (10 mg total) by mouth daily.  Marland Kitchen EPINEPHrine (EPIPEN 2-PAK) 0.3 mg/0.3 mL SOAJ injection Use as directed  . EPINEPHrine 0.3 mg/0.3 mL IJ SOAJ injection Inject 0.3 mLs (0.3 mg total) into the muscle once.  Marland Kitchen  ipratropium-albuterol (DUONEB) 0.5-2.5 (3) MG/3ML SOLN Take 3 mLs by nebulization every 6 (six) hours as needed (shortness of breath/wheezing.).  Marland Kitchen lisinopril-hydrochlorothiazide (PRINZIDE,ZESTORETIC) 20-12.5 MG per tablet Take 1 tablet by mouth daily.  Marland Kitchen omeprazole (PRILOSEC) 20 MG capsule Take 1 capsule (20 mg total) by mouth daily.  Marland Kitchen venlafaxine XR (EFFEXOR-XR) 75 MG 24 hr capsule TAKE 3 CAPSULES (225 MG TOTAL) BY MOUTH DAILY WITH BREAKFAST.  . hydrOXYzine (ATARAX/VISTARIL) 25 MG tablet Take 1 tablet (25 mg total) by mouth 3 (three) times daily as needed for anxiety. (Patient not taking: Reported on 03/22/2015)  . lisinopril (PRINIVIL,ZESTRIL) 10 MG tablet Take 10 mg by mouth daily. Reported on 03/22/2015  . lisinopril (PRINIVIL,ZESTRIL) 20 MG tablet Take 20 mg by mouth daily. Reported on 03/22/2015  . Magnesium 250 MG TABS Take 1 tablet by mouth every other day. Reported on 03/22/2015  . Specialty Vitamins Products (MAGNESIUM, AMINO ACID CHELATE,) 133 MG tablet Take 1 tablet by mouth 2 (two) times daily. Reported on 03/22/2015  . terbinafine (LAMISIL) 250 MG tablet Take 250 mg by mouth daily. Reported on 03/22/2015   No facility-administered encounter medications on file as of 03/22/2015.    Past Psychiatric History/Hospitalization(s): Anxiety: Yes Bipolar Disorder: No Depression: Yes Mania: No Psychosis: No Schizophrenia: No Personality Disorder: No Hospitalization for psychiatric illness: Yes History of Electroconvulsive Shock Therapy: No Prior Suicide Attempts: Yes  Physical Exam: Constitutional:  BP 110/68 mmHg  Pulse 84  Ht 5' 4.5" (1.638 m)  Wt 173 lb 12.8 oz (78.835 kg)  BMI 29.38 kg/m2  General Appearance: alert, oriented, no acute distress  Musculoskeletal: Strength & Muscle Tone: within normal limits Gait & Station: normal Patient leans: straight  Mental Status Examination/Evaluation: Objective: Attitude: Calm and cooperative  Appearance: Well Groomed, appears to be stated  age  Eye Contact::  Good  Speech:  Clear and Coherent and Normal Rate  Volume:  Normal  Mood: euthymic   Affect:  Full Range  Thought Process:  Goal Directed, Linear and Logical  Orientation:  Full (Time, Place, and Person)  Thought Content:  Negative  Suicidal Thoughts:  No  Homicidal Thoughts:  No  Judgement:  Good  Insight:  Good  Concentration: good  Memory: Immediate-good Recent-good Remote-good  Recall: fair  Language: fair  Gait and Station: normal  [employer] of Knowledge: average  Psychomotor Activity:  Normal  Akathisia:  No  Handed:  Right  AIMS (if indicated):  n/a  Assets:  Manufacturing systems engineer Desire for Improvement Financial Resources/Insurance Housing Leisure Time Physical Health Resilience Merchant navy officer Decision Making (Choose Three): Established Problem, Stable/Improving (1), Review of Psycho-Social Stressors (1) and Review of Medication Regimen & Side Effects (2)  Assessment: Axis I: MDD- recurrent, severe without psychotic features; GAD  Axis II: deferred     Plan:  Medication management with supportive therapy. Risks/benefits and SE of the medication discussed. Pt verbalized understanding and verbal consent obtained for treatment.  Affirm with the patient that the medications are taken as  ordered. Patient expressed understanding of how their medications were to be used.    Meds: Vistaril  TID prn anxiety and insomnia- no refill today as pt reports she has enough Effexor XR to  qD for depression. Denies any SE and adverse reaction.   Labs: 03/19/13 K low, gluc elevated; UDS negative  Therapy: brief supportive therapy provided. Discussed psychosocial stressors in detail.    Pt denies SI and is at an acute low risk for suicide.Patient told to call clinic if any problems occur. Patient advised to go to ER if they should develop SI/HI, side effects, or if symptoms worsen. Has crisis numbers to call if  needed. Pt verbalized understanding.  F/up in 4 months or sooner if needed.    [name], MD 03/22/2015

## 2015-07-24 ENCOUNTER — Encounter (HOSPITAL_COMMUNITY): Payer: Self-pay | Admitting: Psychiatry

## 2015-07-24 ENCOUNTER — Ambulatory Visit (INDEPENDENT_AMBULATORY_CARE_PROVIDER_SITE_OTHER): Payer: 59 | Admitting: Psychiatry

## 2015-07-24 VITALS — BP 128/82 | HR 73 | Ht 64.5 in | Wt 174.8 lb

## 2015-07-24 DIAGNOSIS — G4700 Insomnia, unspecified: Secondary | ICD-10-CM

## 2015-07-24 DIAGNOSIS — F332 Major depressive disorder, recurrent severe without psychotic features: Secondary | ICD-10-CM

## 2015-07-24 DIAGNOSIS — F411 Generalized anxiety disorder: Secondary | ICD-10-CM | POA: Diagnosis not present

## 2015-07-24 MED ORDER — HYDROXYZINE HCL 10 MG PO TABS: 10.0000 mg | ORAL_TABLET | Freq: Three times a day (TID) | ORAL | Status: DC | PRN

## 2015-07-24 MED ORDER — VENLAFAXINE HCL ER 75 MG PO CP24: ORAL_CAPSULE | ORAL | Status: DC

## 2015-07-24 NOTE — Progress Notes (Signed)
Patient ID: Otilia Kareem, female   DOB: 1970/10/24, 45 y.o.   MRN: [id number]  [facility] [id number] Progress Note  Arlesia Kiel [id number] 44 y.o.  07/24/2015 11:00 AM  Chief Complaint: " I am stressed"  History of Present Illness: Here with fiance.  Stressed- no Aeronautical engineer, hard to be one to taking care of everyone at home, getting married and finding a new home  Overall pt is doing well but is very stressed.   Pt denies depression. Denies anhedonia, isolation, crying spells, low motivation, poor hygiene, worthlessness and hopelessness.  Denies AVH. Pt is engaging in her hobbies.   Pt is not sleeping well and is getting about 1-2 hrs for the last several weeks. Energy is good. Appetite is good. Concentration is good and she is productive at home and at work.   Anxiety is always present and she is overwhelmed. She had 2 panic attack in the last several months.  Pt only takes Vistaril when having panic attacks.   Pt is taking Effexor as prescribed and denies SE.   Suicidal Ideation: No Plan Formed: No Patient has means to carry out plan: No  Homicidal Ideation: No Plan Formed: No Patient has means to carry out plan: No  Review of Systems: Psychiatric: Agitation: No  Hallucination: No Depressed Mood: No Insomnia: No Hypersomnia: No Altered Concentration: No Feels Worthless: Yes Grandiose Ideas: No Belief In Special Powers: No New/Increased Substance Abuse: No Compulsions: No  Neurologic: Headache: No Seizure: No Paresthesias: No  Review of Systems  Constitutional: Negative for fever, chills and weight loss.  HENT: Positive for congestion. Negative for ear pain, nosebleeds and sore throat.   Eyes: Negative for blurred vision, double vision and pain.  Respiratory: Negative for cough, sputum production and shortness of breath.   Cardiovascular: Negative for chest pain, palpitations and leg swelling.  Gastrointestinal: Negative for heartburn, nausea,  vomiting and abdominal pain.  Musculoskeletal: Negative for myalgias, back pain, joint pain and neck pain.  Skin: Negative for itching and rash.  Neurological: Negative for dizziness, sensory change, seizures, loss of consciousness and headaches.  Psychiatric/Behavioral: Negative for depression, suicidal ideas, hallucinations and substance abuse. The patient is nervous/anxious and has insomnia.      Past Medical Family, Social History: Pt has 2 kids who live at home with her. She is divorced. 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  reports that she has never smoked. She has never used smokeless tobacco. She reports that she does not drink alcohol or use illicit drugs.  Family History  Problem Relation Age of Onset  . Heart attack Mother     Bypass x5  . Allergies Mother   . Diabetes Mellitus II Father   . Hypertension Father   . Allergies Daughter   . Allergies Maternal Grandmother   . Atrial fibrillation Maternal Grandmother   . Stroke Maternal Grandmother   . Suicidality Neg Hx   . Depression Neg Hx   . Anxiety disorder Neg Hx     Past Medical History  Diagnosis Date  . Asthma   . Hypokalemia   . Anxiety   . Depression   . Seasonal allergies   . Environmental allergies     food/medication  . Pneumomediastinum (HCC)   . Suicidal ideations   . Headache(784.0)   . Hypertension      Outpatient Encounter Prescriptions as of 07/24/2015  Medication Sig  . albuterol (PROVENTIL  HFA;VENTOLIN HFA) 108 (90 BASE) MCG/ACT inhaler Inhale 2 puffs into the lungs every 6 (six) hours as needed for wheezing or shortness of breath.  . cetirizine (ZYRTEC) 10 MG tablet Take 1 tablet (10 mg total) by mouth daily.  Marland Kitchen EPINEPHrine (EPIPEN 2-PAK) 0.3 mg/0.3 mL SOAJ injection Use as directed  . EPINEPHrine 0.3 mg/0.3 mL IJ SOAJ injection Inject 0.3 mLs (0.3 mg total) into the muscle once.  . hydrOXYzine (ATARAX/VISTARIL) 25  MG tablet Take 1 tablet (25 mg total) by mouth 3 (three) times daily as needed for anxiety.  Marland Kitchen ipratropium-albuterol (DUONEB) 0.5-2.5 (3) MG/3ML SOLN Take 3 mLs by nebulization every 6 (six) hours as needed (shortness of breath/wheezing.).  Marland Kitchen lisinopril (PRINIVIL,ZESTRIL) 10 MG tablet Take 10 mg by mouth daily. Reported on 03/22/2015  . lisinopril (PRINIVIL,ZESTRIL) 20 MG tablet Take 20 mg by mouth daily. Reported on 03/22/2015  . lisinopril-hydrochlorothiazide (PRINZIDE,ZESTORETIC) 20-12.5 MG per tablet Take 1 tablet by mouth daily.  . Magnesium 250 MG TABS Take 1 tablet by mouth every other day. Reported on 03/22/2015  . omeprazole (PRILOSEC) 20 MG capsule Take 1 capsule (20 mg total) by mouth daily.  Marland Kitchen Specialty Vitamins Products (MAGNESIUM, AMINO ACID CHELATE,) 133 MG tablet Take 1 tablet by mouth 2 (two) times daily. Reported on 03/22/2015  . terbinafine (LAMISIL) 250 MG tablet Take 250 mg by mouth daily. Reported on 03/22/2015  . venlafaxine XR (EFFEXOR-XR) 75 MG 24 hr capsule TAKE 3 CAPSULES (225 MG TOTAL) BY MOUTH DAILY WITH BREAKFAST.   No facility-administered encounter medications on file as of 07/24/2015.    Past Psychiatric History/Hospitalization(s): Anxiety: Yes Bipolar Disorder: No Depression: Yes Mania: No Psychosis: No Schizophrenia: No Personality Disorder: No Hospitalization for psychiatric illness: Yes History of Electroconvulsive Shock Therapy: No Prior Suicide Attempts: Yes  Physical Exam: Constitutional:  BP 128/82 mmHg  Pulse 73  Ht 5' 4.5" (1.638 m)  Wt 174 lb 12.8 oz (79.289 kg)  BMI 29.55 kg/m2  General Appearance: alert, oriented, no acute distress  Musculoskeletal: Strength & Muscle Tone: within normal limits Gait & Station: normal Patient leans: straight  Mental Status Examination/Evaluation: Objective: Attitude: Calm and cooperative  Appearance: Well Groomed, appears to be stated age  Eye Contact::  Good  Speech:  Clear and Coherent and Normal Rate   Volume:  Normal  Mood: anxious  Affect:  Full Range  Thought Process:  Goal Directed, Linear and Logical  Orientation:  Full (Time, Place, and Person)  Thought Content:  Negative  Suicidal Thoughts:  No  Homicidal Thoughts:  No  Judgement:  Good  Insight:  Good  Concentration: good  Memory: Immediate-good Recent-good Remote-good  Recall: fair  Language: fair  Gait and Station: normal  [employer] of Knowledge: average  Psychomotor Activity:  Normal  Akathisia:  No  Handed:  Right  AIMS (if indicated):  n/a  Assets:  Manufacturing systems engineer Desire for Improvement Financial Resources/Insurance Housing Leisure Time Physical Health Resilience Merchant navy officer Decision Making (Choose Three): Established Problem, Stable/Improving (1), Review of Psycho-Social Stressors (1) and Review of Medication Regimen & Side Effects (2)  Assessment: Axis I: MDD- recurrent, severe without psychotic features; GAD; Insomnia  Axis II: deferred     Plan:  Medication management with supportive therapy. Risks/benefits and SE of the medication discussed. Pt verbalized understanding and verbal consent obtained for treatment.  Affirm with the patient that the medications are taken as ordered. Patient expressed understanding of how their medications were to be used.  Meds: Vistaril 10mg  TID prn anxiety and insomnia- Effexor XR 225mg  qD for depression. Denies any SE and adverse reaction.   Labs: 03/19/13 K low, gluc elevated; UDS negative  Therapy: brief supportive therapy provided. Discussed psychosocial stressors in detail.    Pt denies SI and is at an acute low risk for suicide.Patient told to call clinic if any problems occur. Patient advised to go to ER if they should develop SI/HI, side effects, or if symptoms worsen. Has crisis numbers to call if needed. Pt verbalized understanding.  F/up in 3 months or sooner if needed.    Oletta DarterSalina Priscella Donna,  MD 07/24/2015

## 2015-09-26 ENCOUNTER — Encounter (HOSPITAL_COMMUNITY): Payer: Self-pay | Admitting: *Deleted

## 2015-09-26 ENCOUNTER — Emergency Department (HOSPITAL_COMMUNITY)
Admission: EM | Admit: 2015-09-26 | Discharge: 2015-09-26 | Disposition: A | Discharge status: Home / Self Care | Payer: Worker's Compensation | Attending: Emergency Medicine | Admitting: Emergency Medicine

## 2015-09-26 DIAGNOSIS — S61012A Laceration without foreign body of left thumb without damage to nail, initial encounter: Secondary | ICD-10-CM | POA: Insufficient documentation

## 2015-09-26 DIAGNOSIS — Y929 Unspecified place or not applicable: Secondary | ICD-10-CM | POA: Diagnosis not present

## 2015-09-26 DIAGNOSIS — W275XXA Contact with paper-cutter, initial encounter: Secondary | ICD-10-CM | POA: Insufficient documentation

## 2015-09-26 DIAGNOSIS — I10 Essential (primary) hypertension: Secondary | ICD-10-CM | POA: Diagnosis not present

## 2015-09-26 DIAGNOSIS — Z23 Encounter for immunization: Secondary | ICD-10-CM | POA: Insufficient documentation

## 2015-09-26 DIAGNOSIS — Y990 Civilian activity done for income or pay: Secondary | ICD-10-CM | POA: Insufficient documentation

## 2015-09-26 DIAGNOSIS — Z79899 Other long term (current) drug therapy: Secondary | ICD-10-CM | POA: Diagnosis not present

## 2015-09-26 DIAGNOSIS — Y9389 Activity, other specified: Secondary | ICD-10-CM | POA: Insufficient documentation

## 2015-09-26 DIAGNOSIS — J45909 Unspecified asthma, uncomplicated: Secondary | ICD-10-CM | POA: Diagnosis not present

## 2015-09-26 MED ORDER — TETANUS-DIPHTH-ACELL PERTUSSIS 5-2.5-18.5 LF-MCG/0.5 IM SUSP
0.5000 mL | Freq: Once | INTRAMUSCULAR | Status: AC
Start: 2015-09-26 — End: 2015-09-26
  Administered 2015-09-26: 0.5 mL via INTRAMUSCULAR
  Filled 2015-09-26: qty 0.5

## 2015-09-26 MED ORDER — LIDOCAINE HCL (PF) 1 % IJ SOLN
5.0000 mL | Freq: Once | INTRAMUSCULAR | Status: AC
  Administered 2015-09-26: 5 mL via INTRADERMAL

## 2015-09-26 MED ORDER — BACITRACIN ZINC 500 UNIT/GM EX OINT
TOPICAL_OINTMENT | CUTANEOUS | Status: AC
  Filled 2015-09-26: qty 0.9

## 2015-09-26 MED ORDER — LIDOCAINE HCL (PF) 1 % IJ SOLN
INTRAMUSCULAR | Status: AC
  Administered 2015-09-26: 5 mL via INTRADERMAL
  Filled 2015-09-26: qty 5

## 2015-09-26 NOTE — ED Triage Notes (Signed)
Pt was at work and cut left thumb while breaking down a cardboard box with a box cutter

## 2015-09-26 NOTE — ED Provider Notes (Signed)
TIME SEEN: 2:45 AM  CHIEF COMPLAINT: Left thumb laceration  HPI: Pt is a 45 y.o. female with history of hypertension who presents to the emergency department with a left thumb laceration. Reports she was at work opening boxes when she cut her left thumb with a box cutter. She is right-hand dominant. No other injury. Unsure of her last tetanus vaccination.  She is hypertensive in the emergency department but denies headache, vision changes, chest pain or shortness of breath, numbness, tingling or focal weakness. States she was taken off of her lisinopril by her primary care provider. Is not on any other medications for hypertension at this time.  ROS: See HPI Constitutional: no fever  Eyes: no drainage  ENT: no runny nose   Cardiovascular:  no chest pain  Resp: no SOB  GI: no vomiting GU: no dysuria Integumentary: no rash  Allergy: no hives  Musculoskeletal: no leg swelling  Neurological: no slurred speech ROS otherwise negative  PAST MEDICAL HISTORY/PAST SURGICAL HISTORY:  Past Medical History:  Diagnosis Date  . Anxiety   . Asthma   . Depression   . Environmental allergies    food/medication  . Headache(784.0)   . Hypertension   . Hypokalemia   . Pneumomediastinum (HCC)   . Seasonal allergies   . Suicidal ideations     MEDICATIONS:  Prior to Admission medications   Medication Sig Start Date End Date Taking? Authorizing Provider  albuterol (PROVENTIL HFA;VENTOLIN HFA) 108 (90 BASE) MCG/ACT inhaler Inhale 2 puffs into the lungs every 6 (six) hours as needed for wheezing or shortness of breath. 06/09/13   Waymon Budgelinton D Young, MD  EPINEPHrine (EPIPEN 2-PAK) 0.3 mg/0.3 mL SOAJ injection Use as directed 03/26/13   Charm RingsJamison Y Lord, NP  EPINEPHrine 0.3 mg/0.3 mL IJ SOAJ injection Inject 0.3 mLs (0.3 mg total) into the muscle once. 11/18/13   Haevyn Ury N Fred Hammes, DO  hydrOXYzine (ATARAX/VISTARIL) 10 MG tablet Take 1 tablet (10 mg total) by mouth 3 (three) times daily as needed for anxiety.  07/24/15   Oletta DarterSalina Agarwal, MD  ipratropium-albuterol (DUONEB) 0.5-2.5 (3) MG/3ML SOLN Take 3 mLs by nebulization every 6 (six) hours as needed (shortness of breath/wheezing.).    Historical Provider, MD  omeprazole (PRILOSEC) 20 MG capsule Take 1 capsule (20 mg total) by mouth daily. 03/26/13   Charm RingsJamison Y Lord, NP  venlafaxine XR (EFFEXOR-XR) 75 MG 24 hr capsule TAKE 3 CAPSULES (225 MG TOTAL) BY MOUTH DAILY WITH BREAKFAST. 07/24/15   Oletta DarterSalina Agarwal, MD    ALLERGIES:  Allergies  Allergen Reactions  . Nsaids Shortness Of Breath  . Shellfish Allergy Anaphylaxis  . Abilify [Aripiprazole] Other (See Comments)    hallucinations  . Amoxicillin   . Fish Allergy Other (See Comments)    Reaction unknown  . Latuda [Lurasidone Hcl] Other (See Comments)    Uncontrollable shaking, hopelessness.  Barber[employer] Setters. Levaquin [Levofloxacin In D5w] Other (See Comments)    Reaction unknown  . Other Other (See Comments)    Reaction unknown to nuts  . Penicillins Other (See Comments)    Reaction unknown  . Prednisone Other (See Comments)    Pruritus/ hives  . Seroquel [Quetiapine Fumarate] Other (See Comments)    Patient states it made her not care about the world and she just sat down and didn't get up.  . Sulfa Antibiotics Other (See Comments)    Reaction unknown  . Ativan [Lorazepam] Anxiety    Shakiness  . Xanax [Alprazolam] Anxiety    Makes pt. anxious  SOCIAL HISTORY:  Social History  Substance Use Topics  . Smoking status: Never Smoker  . Smokeless tobacco: Never Used  . Alcohol use No    FAMILY HISTORY: Family History  Problem Relation Age of Onset  . Heart attack Mother     Bypass x5  . Allergies Mother   . Diabetes Mellitus II Father   . Hypertension Father   . Allergies Daughter   . Allergies Maternal Grandmother   . Atrial fibrillation Maternal Grandmother   . Stroke Maternal Grandmother   . Suicidality Neg Hx   . Depression Neg Hx   . Anxiety disorder Neg Hx     EXAM: BP (!)  167/123 (BP Location: Right Arm)   Pulse 89   Temp 98.9 F (37.2 C)   Resp 20   Ht 5\' 4"  (1.626 m)   Wt 170 lb (77.1 kg)   SpO2 98%   BMI 29.18 kg/m  CONSTITUTIONAL: Alert and oriented and responds appropriately to questions. Well-appearing; well-nourished HEAD: Normocephalic EYES: Conjunctivae clear, PERRL ENT: normal nose; no rhinorrhea; moist mucous membranes NECK: Supple, no meningismus, no LAD  CARD: RRR; S1 and S2 appreciated; no murmurs, no clicks, no rubs, no gallops RESP: Normal chest excursion without splinting or tachypnea; breath sounds clear and equal bilaterally; no wheezes, no rhonchi, no rales, no hypoxia or respiratory distress, speaking full sentences ABD/GI: Normal bowel sounds; non-distended; soft, non-tender, no rebound, no guarding, no peritoneal signs BACK:  The back appears normal and is non-tender to palpation, there is no CVA tenderness EXT: Normal ROM in all joints; non-tender to palpation; no edema; normal capillary refill; no cyanosis, no calf tenderness or swelling, superficial laceration to the palmar aspect of the left thumb without tendon involvement, full range of motion in this finger, 2+ radial pulses bilaterally, no signs of superimposed infection.    SKIN: Normal color for age and race; warm; no rash; 5 cm laceration to the palmar aspect of the left thumb without tendon involvement, no erythema or warmth NEURO: Moves all extremities equally, sensation to light touch intact diffusely, cranial nerves II through XII intact PSYCH: The patient's mood and manner are appropriate. Grooming and personal hygiene are appropriate.  MEDICAL DECISION MAKING: Patient here with laceration to the left thumb which we have repaired. We have updated her tetanus vaccination. She is hypertensive but states she thinks it is because she is anxious. Was taken off of her BP medications by her primary care provider because she states her blood pressure was in the 110 is/60s after  being off lisinopril for 2 weeks. I have recommended she have her blood pressure rechecked by her PCP next week when she is feeling better. Discussed with her return precautions. She is comfortable with this plan.  At this time, I do not feel there is any life-threatening condition present. I have reviewed and discussed all results (EKG, imaging, lab, urine as appropriate), exam findings with patient/family. I have reviewed nursing notes and appropriate previous records.  I feel the patient is safe to be discharged home without further emergent workup and can continue workup as an outpatient as needed. Discussed usual and customary return precautions. Patient/family verbalize understanding and are comfortable with this plan.  Outpatient follow-up has been provided. All questions have been answered.     LACERATION REPAIR Performed by: Raelyn Number Authorized by: Raelyn Number Consent: Verbal consent obtained. Risks and benefits: risks, benefits and alternatives were discussed Consent given by: patient Patient identity confirmed: provided  demographic data Prepped and Draped in normal sterile fashion Wound explored  Laceration Location: left thumb  Laceration Length: 5cm  No Foreign Bodies seen or palpated  Anesthesia: local infiltration  Local anesthetic: lidocaine 1% without epinephrine  Anesthetic total: 4 ml  Irrigation method: syringe Amount of cleaning: standard  Skin closure: superficial  Number of sutures: 6  Technique: Area anesthetized using lidocaine 1% without epinephrine. Wound irrigated copiously with sterile saline. Wound then cleaned with Betadine and draped in sterile fashion. Wound closed using 6 simple interrupted sutures with 4-0 Prolene.  Bacitracin and sterile dressing applied. Good wound approximation and hemostasis achieved.    Patient tolerance: Patient tolerated the procedure well with no immediate complications.     Layla Maw Tamaj Jurgens, DO 09/26/15  [phone]

## 2015-10-30 ENCOUNTER — Ambulatory Visit (HOSPITAL_COMMUNITY): Payer: Self-pay | Admitting: Psychiatry

## 2015-11-19 ENCOUNTER — Other Ambulatory Visit (HOSPITAL_COMMUNITY): Payer: Self-pay | Admitting: Psychiatry

## 2015-11-19 DIAGNOSIS — F411 Generalized anxiety disorder: Secondary | ICD-10-CM

## 2015-11-19 DIAGNOSIS — F332 Major depressive disorder, recurrent severe without psychotic features: Secondary | ICD-10-CM

## 2016-01-03 ENCOUNTER — Other Ambulatory Visit (HOSPITAL_COMMUNITY): Payer: Self-pay | Admitting: Psychiatry

## 2016-01-03 DIAGNOSIS — F332 Major depressive disorder, recurrent severe without psychotic features: Secondary | ICD-10-CM

## 2016-01-03 DIAGNOSIS — F411 Generalized anxiety disorder: Secondary | ICD-10-CM

## 2016-01-04 ENCOUNTER — Other Ambulatory Visit (HOSPITAL_COMMUNITY): Payer: Self-pay

## 2016-01-04 DIAGNOSIS — F411 Generalized anxiety disorder: Secondary | ICD-10-CM

## 2016-01-04 DIAGNOSIS — F332 Major depressive disorder, recurrent severe without psychotic features: Secondary | ICD-10-CM

## 2016-01-04 MED ORDER — VENLAFAXINE HCL ER 75 MG PO CP24: 225.0000 mg | ORAL_CAPSULE | Freq: Every day | ORAL | 0 refills | Status: DC

## 2016-01-31 ENCOUNTER — Other Ambulatory Visit (HOSPITAL_COMMUNITY): Payer: Self-pay | Admitting: Psychiatry

## 2016-01-31 ENCOUNTER — Ambulatory Visit (HOSPITAL_COMMUNITY): Payer: Self-pay | Admitting: Psychiatry

## 2016-01-31 DIAGNOSIS — F332 Major depressive disorder, recurrent severe without psychotic features: Secondary | ICD-10-CM

## 2016-01-31 DIAGNOSIS — F411 Generalized anxiety disorder: Secondary | ICD-10-CM

## 2016-02-04 ENCOUNTER — Other Ambulatory Visit (HOSPITAL_COMMUNITY): Payer: Self-pay | Admitting: Psychiatry

## 2016-02-05 ENCOUNTER — Encounter (HOSPITAL_COMMUNITY): Payer: Self-pay | Admitting: Psychiatry

## 2016-02-05 ENCOUNTER — Ambulatory Visit (INDEPENDENT_AMBULATORY_CARE_PROVIDER_SITE_OTHER): Payer: BLUE CROSS/BLUE SHIELD | Admitting: Psychiatry

## 2016-02-05 DIAGNOSIS — F411 Generalized anxiety disorder: Secondary | ICD-10-CM

## 2016-02-05 DIAGNOSIS — Z8249 Family history of ischemic heart disease and other diseases of the circulatory system: Secondary | ICD-10-CM

## 2016-02-05 DIAGNOSIS — Z823 Family history of stroke: Secondary | ICD-10-CM

## 2016-02-05 DIAGNOSIS — F5105 Insomnia due to other mental disorder: Secondary | ICD-10-CM | POA: Diagnosis not present

## 2016-02-05 DIAGNOSIS — F332 Major depressive disorder, recurrent severe without psychotic features: Secondary | ICD-10-CM | POA: Diagnosis not present

## 2016-02-05 DIAGNOSIS — Z79899 Other long term (current) drug therapy: Secondary | ICD-10-CM

## 2016-02-05 DIAGNOSIS — Z833 Family history of diabetes mellitus: Secondary | ICD-10-CM

## 2016-02-05 DIAGNOSIS — F99 Mental disorder, not otherwise specified: Secondary | ICD-10-CM

## 2016-02-05 MED ORDER — VENLAFAXINE HCL ER 75 MG PO CP24: 225.0000 mg | ORAL_CAPSULE | Freq: Every day | ORAL | 2 refills | Status: DC

## 2016-02-05 MED ORDER — HYDROXYZINE HCL 10 MG PO TABS: 10.0000 mg | ORAL_TABLET | Freq: Three times a day (TID) | ORAL | 2 refills | Status: DC | PRN

## 2016-02-05 NOTE — Progress Notes (Signed)
Patient ID: Nancy Barnes, female   DOB: 04/10/1970, 46 y.o.   MRN: [id number]  [facility] [id number] Progress Note  Nancy Barnes [id number] 46 y.o.  02/05/2016 8:42 AM  Chief Complaint: "Crazy"  History of Present Illness: reviewed information below with patient on 02/05/16 and same as previous visits except as noted  Pt was last seen in July 2017 and reports she is here for med refill.   Stressed-  hard to be one to taking care of everyone at home- Mom has CHF and sister has ovarian cancer.  Kids are doing well and are helping her out. Pt is no longer engaged and feels good about it.   Pt now has health insurance.   Pt feels down/stressed daily. Pt is trying to keep it at bay by journal-ing. Pt denies depression. Denies anhedonia, isolation, crying spells, low motivation, poor hygiene, worthlessness and hopelessness.  Denies AVH.   Pt is sleeping well and is getting about 6-8 hrs/night. Energy is good. Appetite is good. Concentration is good and she is productive at home and at work.   Anxiety is always present and she is overwhelmed. She reports random stress induced panic attack about once a week.  Pt only takes Vistaril when having panic attacks. It calms her.   Pt is taking Effexor as prescribed and denies SE.   Suicidal Ideation: No Plan Formed: No Patient has means to carry out plan: No  Homicidal Ideation: No Plan Formed: No Patient has means to carry out plan: No  Review of Systems: Psychiatric: Agitation: No  Hallucination: No Depressed Mood: No Insomnia: No Hypersomnia: No Altered Concentration: No Feels Worthless: Yes Grandiose Ideas: No Belief In Special Powers: No New/Increased Substance Abuse: No Compulsions: No  Neurologic: Headache: No Seizure: No Paresthesias: No  Review of Systems  Gastrointestinal: Negative for abdominal pain, heartburn, nausea and vomiting.  Musculoskeletal: Negative for back pain, falls, joint pain and neck pain.   Neurological: Negative for dizziness, tremors, sensory change, seizures, loss of consciousness and headaches.  Psychiatric/Behavioral: Negative for depression, hallucinations, substance abuse and suicidal ideas. The patient is nervous/anxious. The patient does not have insomnia.      Past Medical Family, Social History:  reviewed information below with patient on 02/05/16 and same as previous visits except as noted Pt has 2 kids who live at home with her. She is divorced. 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  reports that she has never smoked. She has never used smokeless tobacco. She reports that she does not drink alcohol or use drugs.  Family History  Problem Relation Age of Onset  . Heart attack Mother     Bypass x5  . Allergies Mother   . Diabetes Mellitus II Father   . Hypertension Father   . Allergies Daughter   . Allergies Maternal Grandmother   . Atrial fibrillation Maternal Grandmother   . Stroke Maternal Grandmother   . Suicidality Neg Hx   . Depression Neg Hx   . Anxiety disorder Neg Hx     Past Medical History:  Diagnosis Date  . Anxiety   . Asthma   . Depression   . Environmental allergies    food/medication  . Headache(784.0)   . Hypertension   . Hypokalemia   . Pneumomediastinum (HCC)   . Seasonal allergies   . Suicidal ideations      Outpatient Encounter Prescriptions as of 02/05/2016  Medication Sig  .  albuterol (PROVENTIL HFA;VENTOLIN HFA) 108 (90 BASE) MCG/ACT inhaler Inhale 2 puffs into the lungs every 6 (six) hours as needed for wheezing or shortness of breath.  . EPINEPHrine (EPIPEN 2-PAK) 0.3 mg/0.3 mL SOAJ injection Use as directed  . EPINEPHrine 0.3 mg/0.3 mL IJ SOAJ injection Inject 0.3 mLs (0.3 mg total) into the muscle once.  . hydrOXYzine (ATARAX/VISTARIL) 10 MG tablet Take 1 tablet (10 mg total) by mouth 3 (three) times daily as needed for anxiety.  Marland Kitchen.  ipratropium-albuterol (DUONEB) 0.5-2.5 (3) MG/3ML SOLN Take 3 mLs by nebulization every 6 (six) hours as needed (shortness of breath/wheezing.).  Marland Kitchen. Misc Natural Products (TART CHERRY ADVANCED PO) Take by mouth.  Marland Kitchen. omeprazole (PRILOSEC) 20 MG capsule Take 1 capsule (20 mg total) by mouth daily.  . Probiotic Product (PROBIOTIC-10 PO) Take by mouth.  . venlafaxine XR (EFFEXOR-XR) 75 MG 24 hr capsule Take 3 capsules (225 mg total) by mouth daily with breakfast.   No facility-administered encounter medications on file as of 02/05/2016.     Past Psychiatric History/Hospitalization(s): Anxiety: Yes Bipolar Disorder: No Depression: Yes Mania: No Psychosis: No Schizophrenia: No Personality Disorder: No Hospitalization for psychiatric illness: Yes History of Electroconvulsive Shock Therapy: No Prior Suicide Attempts: Yes  Physical Exam: Constitutional:  BP 106/62 (BP Location: Left Arm, Patient Position: Sitting, Cuff Size: Normal)   Pulse 86   Ht 5' 4.5" (1.638 m)   Wt 168 lb (76.2 kg)   BMI 28.39 kg/m   General Appearance: alert, oriented, no acute distress  Musculoskeletal: Strength & Muscle Tone: within normal limits Gait & Station: normal Patient leans: straight  Mental Status Examination/Evaluation: reviewed MSE on 02/05/16 and same as previous visits except as noted  Objective: Attitude: Calm and cooperative  Appearance: Well Groomed, appears to be stated age  Eye Contact::  Good  Speech:  Clear and Coherent and Normal Rate  Volume:  Normal  Mood: anxious  Affect:  Full Range  Thought Process:  Goal Directed, Linear and Logical  Orientation:  Full (Time, Place, and Person)  Thought Content:  Negative  Suicidal Thoughts:  No  Homicidal Thoughts:  No  Judgement:  Good  Insight:  Good  Concentration: good  Memory: Immediate-good Recent-good Remote-good  Recall: fair  Language: fair  Gait and Station: normal  [employer] of Knowledge: average  Psychomotor  Activity:  Normal  Akathisia:  No  Handed:  Right  AIMS (if indicated):  n/a  Assets:  Communication Skills Desire for Improvement Financial Resources/Insurance Housing Leisure Time Physical Health Resilience Talents/Skills Transportation     Reviewed A&P on 02/05/16 and same as previous visits except as noted  Assessment: Axis I: MDD- recurrent, severe without psychotic features; GAD; Insomnia  Axis II: deferred     Plan:  Medication management with supportive therapy. Risks/benefits and SE of the medication discussed. Pt verbalized understanding and verbal consent obtained for treatment.  Affirm with the patient that the medications are taken as ordered. Patient expressed understanding of how their medications were to be used.    Meds: Vistaril 10mg  TID prn anxiety and insomnia- Effexor XR 225mg  qD for depression. Denies any SE and adverse reaction.   Labs: none today  Therapy: brief supportive therapy provided. Discussed psychosocial stressors in detail.    Pt denies SI and is at an acute low risk for suicide.Patient told to call clinic if any problems occur. Patient advised to go to ER if they should develop SI/HI, side effects, or if symptoms worsen. Has crisis  numbers to call if needed. Pt verbalized understanding.  F/up in 3 months or sooner if needed.    [name], MD 02/05/2016

## 2016-03-12 ENCOUNTER — Ambulatory Visit (INDEPENDENT_AMBULATORY_CARE_PROVIDER_SITE_OTHER): Payer: BLUE CROSS/BLUE SHIELD | Admitting: Psychiatry

## 2016-03-12 DIAGNOSIS — F325 Major depressive disorder, single episode, in full remission: Secondary | ICD-10-CM | POA: Diagnosis not present

## 2016-03-12 NOTE — Progress Notes (Signed)
Comprehensive Clinical Assessment (CCA) Note  03/12/2016 Nancy Barnes [id number]  Visit Diagnosis:      ICD-9-CM ICD-10-CM   1. Major depressive disorder with single episode, in remission (HCC) 296.25 F32.5       CCA Part One  Part One has been completed on paper by the patient.  (See scanned document in Chart Review)  CCA Part Two A  Intake/Chief Complaint:     Mental Health Symptoms Depression:     Mania:     Anxiety:      Psychosis:     Trauma:     Obsessions:     Compulsions:     Inattention:     Hyperactivity/Impulsivity:     Oppositional/Defiant Behaviors:     Borderline Personality:     Other Mood/Personality Symptoms:      Mental Status Exam Appearance and self-care  Stature:     Weight:     Clothing:     Grooming:     Cosmetic use:     Posture/gait:     Motor activity:     Sensorium  Attention:     Concentration:     Orientation:     Recall/memory:     Affect and Mood  Affect:     Mood:     Relating  Eye contact:     Facial expression:     Attitude toward examiner:     Thought and Language  Speech flow:    Thought content:     Preoccupation:     Hallucinations:     Organization:     Company secretaryxecutive Functions  Fund of Knowledge:     Intelligence:     Abstraction:     Judgement:     Reality Testing:     Insight:     Decision Making:     Social Functioning  Social Maturity:  Social Maturity: Responsible  Social Judgement:  Social Judgement: Normal  Stress  Stressors:  Stressors: Work, Family conflict  Coping Ability:  Coping Ability: Deficient supports  Skill Deficits:     Supports:      Family and Psychosocial History:    Childhood History:     CCA Part Two B  Employment/Work Situation: Employment / Work Situation Employment situation: Employed Where is patient currently employed?: [employer] How long has patient been employed?: almost a year Patient's job has been impacted by current illness: No What is the longest time patient  has a held a job?: 4 1/2 years Where was the patient employed at that time?: [employer] Has patient ever been in the Eli Lilly and Companymilitary?: No Has patient ever served in combat?: No Did You Receive Any Psychiatric Treatment/Services While in Equities traderthe Military?: No Are There Guns or Other Weapons in Your Home?: No Are These Weapons Safely Secured?: Yes  Education: Education Did Theme park managerYou Attend College?: Yes What Type of College Degree Do you Have?: Junior year; Orthoptisthuman services/emergency management Did You Attend Graduate School?: No 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 Did You Have An Individualized Education Program (IIEP): No Did You Have Any Difficulty At Progress EnergySchool?: No  Religion: Religion/Spirituality Are You A Religious Person?: Yes What is Your Religious Affiliation?: ChiropodistChristian  Leisure/Recreation: Leisure / Recreation Leisure and Hobbies: Archivistknitting, Programmer, systemscrocheting, Company secretaryweaving, photography  Exercise/Diet: Exercise/Diet Do You Exercise?: No (very active in work) Have Hewlett-PackardYou Gained or Lost A Significant Amount of Weight in the Past Six Months?: Yes-Lost Number of Pounds Lost?: 20 Do You Follow a Special Diet?: No Do You Have Any  Trouble Sleeping?: No  CCA Part Two C  Alcohol/Drug Use: Alcohol / Drug Use Pain Medications: none Prescriptions: anti-depressant Over the Counter: prilosec; cherry tart extract; probiotic History of alcohol / drug use?: No history of alcohol / drug abuse                      CCA Part Three  ASAM's:  Six Dimensions of Multidimensional Assessment  Dimension 1:  Acute Intoxication and/or Withdrawal Potential:     Dimension 2:  Biomedical Conditions and Complications:     Dimension 3:  Emotional, Behavioral, or Cognitive Conditions and Complications:     Dimension 4:  Readiness to Change:     Dimension 5:  Relapse, Continued use, or Continued Problem Potential:     Dimension 6:  Recovery/Living Environment:      Substance use Disorder  (SUD)    Social Function:  Social Functioning Social Maturity: Responsible Social Judgement: Normal  Stress:  Stress Stressors: Work, Family conflict Coping Ability: Deficient supports Patient Takes Medications The Way The Doctor Instructed?: Yes Priority Risk: Low Acuity  Risk Assessment- Self-Harm Potential: Risk Assessment For Self-Harm Potential Thoughts of Self-Harm: No current thoughts Method: No plan Availability of Means: No access/NA  Risk Assessment -Dangerous to Others Potential: Risk Assessment For Dangerous to Others Potential Availability of Means: No access or NA Intent: Vague intent or NA Notification Required: No need or identified person  DSM5 Diagnoses: Patient Active Problem List   Diagnosis Date Noted  . GAD (generalized anxiety disorder) 11/03/2013  . Major psychotic depression, recurrent (HCC) 11/03/2013  . Medication management 03/19/2013  . Seasonal and perennial allergic rhinitis 02/29/2012  . Food allergy 02/29/2012  . Medication intolerance 02/29/2012  . Asthma, intrinsic 12/23/2011  . Asthma with acute exacerbation 12/09/2011  . Pneumomediastinum (HCC) 12/09/2011  . Depression 12/09/2011  . Anxiety 12/09/2011    Patient Centered Plan: Patient is on the following Treatment Plan(s):  Pt. To begin individual counseling with Boneta Lucks, Ph.D.  Recommendations for Services/Supports/Treatments: Recommendations for Services/Supports/Treatments Recommendations For Services/Supports/Treatments: Individual Therapy, Medication Management  Treatment Plan Summary:    Referrals to Alternative Service(s): Referred to Alternative Service(s):   Place:   Date:   Time:    Referred to Alternative Service(s):   Place:   Date:   Time:    Referred to Alternative Service(s):   Place:   Date:   Time:    Referred to Alternative Service(s):   Place:   Date:   Time:     Shaune Pollack

## 2016-04-22 ENCOUNTER — Ambulatory Visit (INDEPENDENT_AMBULATORY_CARE_PROVIDER_SITE_OTHER): Payer: BLUE CROSS/BLUE SHIELD | Admitting: Psychiatry

## 2016-04-22 DIAGNOSIS — F325 Major depressive disorder, single episode, in full remission: Secondary | ICD-10-CM | POA: Diagnosis not present
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
## 2016-05-08 ENCOUNTER — Encounter (HOSPITAL_COMMUNITY): Payer: Self-pay | Admitting: Psychiatry

## 2016-05-08 ENCOUNTER — Ambulatory Visit (INDEPENDENT_AMBULATORY_CARE_PROVIDER_SITE_OTHER): Payer: BLUE CROSS/BLUE SHIELD | Admitting: Psychiatry

## 2016-05-08 DIAGNOSIS — F411 Generalized anxiety disorder: Secondary | ICD-10-CM

## 2016-05-08 DIAGNOSIS — F332 Major depressive disorder, recurrent severe without psychotic features: Secondary | ICD-10-CM

## 2016-05-08 DIAGNOSIS — Z79899 Other long term (current) drug therapy: Secondary | ICD-10-CM | POA: Diagnosis not present

## 2016-05-08 DIAGNOSIS — G4700 Insomnia, unspecified: Secondary | ICD-10-CM

## 2016-05-08 MED ORDER — VENLAFAXINE HCL ER 75 MG PO CP24: 225.0000 mg | ORAL_CAPSULE | Freq: Every day | ORAL | 2 refills | Status: DC

## 2016-05-08 NOTE — Progress Notes (Signed)
BH MD/PA/NP OP Progress Note  05/08/2016 9:56 AM Nancy Barnes  MRN:  [id number]  Chief Complaint:  Chief Complaint    Follow-up      HPI: She sleeps about 12 hrs/day. Despite this she is tired all the time. She could be starting menopause.  Pt denies depression. Denies anhedonia, isolation, crying spells, low motivation, poor hygiene, worthlessness and hopelessness. Denies SI/HI.  Anxiety is more situational. Pt has not had a panic attack in several months. Pt was able to talk herself down when she felt one was coming on.  States she has not taken Vistaril in months.   Taking meds as prescribed and denies SE.     Visit Diagnosis:    ICD-9-CM ICD-10-CM   1. Severe episode of recurrent major depressive disorder, without psychotic features (HCC) 296.33 F33.2 venlafaxine XR (EFFEXOR-XR) 75 MG 24 hr capsule  2. GAD (generalized anxiety disorder) 300.02 F41.1 venlafaxine XR (EFFEXOR-XR) 75 MG 24 hr capsule    Past Psychiatric History:  Anxiety: Yes Bipolar Disorder: No Depression: Yes Mania: No Psychosis: No Schizophrenia: No Personality Disorder: No Hospitalization for psychiatric illness: Yes History of Electroconvulsive Shock Therapy: No Prior Suicide Attempts: Yes   Past Medical History:  Past Medical History:  Diagnosis Date  . Anxiety   . Asthma   . Depression   . Environmental allergies    food/medication  . Headache(784.0)   . Hypertension   . Hypokalemia   . Pneumomediastinum (HCC)   . Seasonal allergies   . Suicidal ideations     Past Surgical History:  Procedure Laterality Date  . ABLATION    . BREAST BIOPSY  04/2011  . TUBAL LIGATION      Family Psychiatric and Medical History: Family History  Problem Relation Age of Onset  . Heart attack Mother     Bypass x5  . Allergies Mother   . Diabetes Mellitus II Father   . Hypertension Father   . Allergies Daughter   . Allergies Maternal Grandmother   . Atrial fibrillation Maternal Grandmother   .  Stroke Maternal Grandmother   . Suicidality Neg Hx   . Depression Neg Hx   . Anxiety disorder Neg Hx     Social History:  Social History   Social History  . Marital status: Divorced    Spouse name: N/A  . Number of children: N/A  . Years of education: N/A   Occupational History  . 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 Smoking status: Never Smoker  . Smokeless tobacco: Never Used  . Alcohol use Yes     Comment: socially  . Drug use: No  . Sexual activity: Yes    Partners: Male    Birth control/ protection: None   Other Topics Concern  . None   Social History Narrative   Works for [employer]. Lives at home with 2 teenage children. Never smoker.    Allergies:  Allergies  Allergen Reactions  . Nsaids Shortness Of Breath  . Shellfish Allergy Anaphylaxis  . Abilify [Aripiprazole] Other (See Comments)    hallucinations  . Amoxicillin   . Fish Allergy Other (See Comments)    Reaction unknown  . Latuda [Lurasidone Hcl] Other (See Comments)    Uncontrollable shaking, hopelessness.  Barbera Setters [Levofloxacin In D5w] Other (See Comments)    Reaction unknown  . Other Other (See Comments)  Reaction unknown to nuts  . Penicillins Other (See Comments)    Reaction unknown  . Prednisone Other (See Comments)    Pruritus/ hives  . Seroquel [Quetiapine Fumarate] Other (See Comments)    Patient states it made her not care about the world and she just sat down and didn't get up.  . Sulfa Antibiotics Other (See Comments)    Reaction unknown  . Ativan [Lorazepam] Anxiety    Shakiness  . Xanax [Alprazolam] Anxiety    Makes pt. anxious    Metabolic Disorder Labs: No results found for: HGBA1C, MPG No results found for: PROLACTIN No results found for: CHOL, TRIG, HDL, CHOLHDL, VLDL, LDLCALC   Current Medications: Current Outpatient Prescriptions  Medication Sig  Dispense Refill  . albuterol (PROVENTIL HFA;VENTOLIN HFA) 108 (90 BASE) MCG/ACT inhaler Inhale 2 puffs into the lungs every 6 (six) hours as needed for wheezing or shortness of breath. 3 Inhaler 0  . EPINEPHrine (EPIPEN 2-PAK) 0.3 mg/0.3 mL SOAJ injection Use as directed 1 Device   . EPINEPHrine 0.3 mg/0.3 mL IJ SOAJ injection Inject 0.3 mLs (0.3 mg total) into the muscle once. 1 Device 5  . hydrOXYzine (ATARAX/VISTARIL) 10 MG tablet Take 1 tablet (10 mg total) by mouth 3 (three) times daily as needed for anxiety. 90 tablet 2  . ipratropium-albuterol (DUONEB) 0.5-2.5 (3) MG/3ML SOLN Take 3 mLs by nebulization every 6 (six) hours as needed (shortness of breath/wheezing.).    Marland Kitchen Misc Natural Products (TART CHERRY ADVANCED PO) Take by mouth.    Marland Kitchen omeprazole (PRILOSEC) 20 MG capsule Take 1 capsule (20 mg total) by mouth daily.    . Probiotic Product (PROBIOTIC-10 PO) Take by mouth.    . venlafaxine XR (EFFEXOR-XR) 75 MG 24 hr capsule Take 3 capsules (225 mg total) by mouth daily with breakfast. 90 capsule 2   No current facility-administered medications for this visit.       Musculoskeletal: Strength & Muscle Tone: within normal limits Gait & Station: normal Patient leans: N/A  Psychiatric Specialty Exam: Review of Systems  Constitutional: Positive for malaise/fatigue. Negative for chills, fever and weight loss.  Neurological: Negative for dizziness, tingling, tremors, sensory change and headaches.  Psychiatric/Behavioral: Negative for depression, hallucinations, substance abuse and suicidal ideas. The patient is not nervous/anxious and does not have insomnia.     Blood pressure 112/68, pulse (!) 102, height 5' 4.5" (1.638 m), weight 166 lb (75.3 kg).Body mass index is 28.05 kg/m.  General Appearance: Fairly Groomed  Eye Contact:  Good  Speech:  Clear and Coherent and Normal Rate  Volume:  Normal  Mood:  Euthymic  Affect:  Full Range  Thought Process:  Goal Directed and Descriptions of  Associations: Intact  Orientation:  Full (Time, Place, and Person)  Thought Content: Logical   Suicidal Thoughts:  No  Homicidal Thoughts:  No  Memory:  Immediate;   Good Recent;   Good Remote;   Good  Judgement:  Good  Insight:  Good  Psychomotor Activity:  Normal  Concentration:  Concentration: Good and Attention Span: Good  Recall:  Good  Fund of Knowledge: Good  Language: Good  Akathisia:  No  Handed:  Right  AIMS (if indicated):  n/a  Assets:  Communication Skills Desire for Improvement  ADL's:  Intact  Cognition: WNL  Sleep:  good     Treatment Plan Summary:Medication management  Assessment: MDD-recurrent, severe without psychotic features; GAD; Insomnia-resolved   Medication management with supportive therapy. Risks/benefits and SE of the medication  discussed. Pt verbalized understanding and verbal consent obtained for treatment.  Affirm with the patient that the medications are taken as ordered. Patient expressed understanding of how their medications were to be used.    Meds: continue Effexor XR  po qD for depression and anxiety   Labs: none   Therapy: brief supportive therapy provided. Discussed psychosocial stressors in detail.     Consultations: recommended pt f/u with PCP regarding fatigue  Pt denies SI and is at an acute low risk for suicide. Patient told to call clinic if any problems occur. Patient advised to go to ER if they should develop SI/HI, side effects, or if symptoms worsen. Has crisis numbers to call if needed. Pt verbalized understanding.  F/up in 3 months or sooner if needed   [name], MD 05/08/2016, 9:56 AM

## 2016-05-29 ENCOUNTER — Ambulatory Visit (INDEPENDENT_AMBULATORY_CARE_PROVIDER_SITE_OTHER): Payer: BLUE CROSS/BLUE SHIELD | Admitting: Psychiatry

## 2016-05-29 DIAGNOSIS — F332 Major depressive disorder, recurrent severe without psychotic features: Secondary | ICD-10-CM | POA: Diagnosis not present

## 2016-06-05 NOTE — Progress Notes (Signed)
   THERAPIST PROGRESS NOTE   Session Time: 2:05-3:00  Participation Level: Active  Behavioral Response: CasualAlertEuthymic  Type of Therapy: Individual Therapy  Treatment Goals addressed: Coping  Interventions: Strength-based  Summary: Doristine ChurchKelly Arriola is a 46 y.o. female who presents with major depressive disorder, recurrent, mild.   Suicidal/Homicidal: Nowithout intent/plan  Therapist Response: Pt. Presents with euthymic mood. Pt. Reports ongoing stressors of work and living with her adult children. Pt. Is focused on having a healthy relationship with a significant other. Significant part of session focused on identifying core relationship values to her such as ability to trust, acceptance of self and of her, relationship with God, good communication, free from drama of prior relationships. Pt. Discussed recent history in relationships and lessons learned that she can carry forward into her new relationships. Counselor worked to help patient develop awareness of behaviors that are potential red flags from prior relationships such as fault finding behavior and dishonesty.   Plan: Return again in 3-4 weeks.  Diagnosis:      Axis I: Depressive Disorder NOS                          Axis II: No diagnosis   Shaune PollackBrown, Jennifer B, [facility] 06/05/2016

## 2016-06-12 ENCOUNTER — Ambulatory Visit (INDEPENDENT_AMBULATORY_CARE_PROVIDER_SITE_OTHER): Payer: BLUE CROSS/BLUE SHIELD | Admitting: Psychiatry

## 2016-06-12 DIAGNOSIS — F332 Major depressive disorder, recurrent severe without psychotic features: Secondary | ICD-10-CM | POA: Diagnosis not present

## 2016-06-13 NOTE — Progress Notes (Signed)
   THERAPIST PROGRESS NOTE  Session Time: 2:05-3:05  Participation Level:Active  Behavioral Response:CasualAlertEuthymic  Type of Therapy: Individual Therapy  Treatment Goals addressed: Coping  Interventions:Strength-based  Summary: Nancy BarefootKelly Lawsonis a 46 y.o.femalewho presents with major depressive disorder, recurrent, mild.   Suicidal/Homicidal:Nowithout intent/plan  Therapist Response: Pt. Continues to present with euthymic mood. Pt. 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 Pt. Discussed that she visited her home and developed awareness that her children take advantage of her by not contributing to the household. Pt. Discussed that she does not know how to set boundaries and encourage accountability with her children because they are adults. Pt. Was able to identify thought "I am responsible for my children's irreponsibility; therefore I have to put up with their behavior". Pt. Was able to counter this thought with acknowledging that she has responsibility for how their habits have developed, but they have responsibilty as adults to make better choices. Pt. Discussed relationship issues, and indicated that she has been able to put these relationships with men into perspective and recognize that they have decisions that they need to make that she cannot force or control.   Plan: Return again in 3-4 weeks.  Diagnosis:Axis I:Depressive Disorder NOS  Axis II:No diagnosis    Shaune PollackBrown, Veldon Wager B, [facility] 06/13/2016

## 2016-06-24 DIAGNOSIS — J301 Allergic rhinitis due to pollen: Secondary | ICD-10-CM | POA: Diagnosis not present

## 2016-06-24 DIAGNOSIS — J4530 Mild persistent asthma, uncomplicated: Secondary | ICD-10-CM | POA: Diagnosis not present

## 2016-06-24 DIAGNOSIS — F331 Major depressive disorder, recurrent, moderate: Secondary | ICD-10-CM | POA: Diagnosis not present

## 2016-06-24 DIAGNOSIS — E782 Mixed hyperlipidemia: Secondary | ICD-10-CM | POA: Diagnosis not present

## 2016-07-14 ENCOUNTER — Ambulatory Visit (HOSPITAL_COMMUNITY): Payer: Self-pay | Admitting: Psychiatry

## 2016-08-07 ENCOUNTER — Encounter (HOSPITAL_COMMUNITY): Payer: Self-pay | Admitting: Psychiatry

## 2016-08-07 ENCOUNTER — Ambulatory Visit (INDEPENDENT_AMBULATORY_CARE_PROVIDER_SITE_OTHER): Payer: BLUE CROSS/BLUE SHIELD | Admitting: Psychiatry

## 2016-08-07 DIAGNOSIS — G4700 Insomnia, unspecified: Secondary | ICD-10-CM | POA: Diagnosis not present

## 2016-08-07 DIAGNOSIS — Z818 Family history of other mental and behavioral disorders: Secondary | ICD-10-CM | POA: Diagnosis not present

## 2016-08-07 DIAGNOSIS — F332 Major depressive disorder, recurrent severe without psychotic features: Secondary | ICD-10-CM

## 2016-08-07 DIAGNOSIS — F411 Generalized anxiety disorder: Secondary | ICD-10-CM | POA: Diagnosis not present

## 2016-08-07 MED ORDER — VENLAFAXINE HCL ER 75 MG PO CP24: 225.0000 mg | ORAL_CAPSULE | Freq: Every day | ORAL | 2 refills | Status: DC

## 2016-08-07 NOTE — Progress Notes (Signed)
BH MD/PA/NP OP Progress Note  08/07/2016 1:05 PM Nancy Barnes  MRN:  [id number]  Chief Complaint:  Chief Complaint    Follow-up     HPI: Pt states she continues to stay tired. She could fall asleep sitting in a chair. Sleep is good.   Pt states she is still depressed. She reports she doesn't do anything but go to work and come home. She doesn't trust anyone and as a result has no friends. Pt is isolating some and is endorsing worthlessness and anhedonia. She denies SI/HI.   She is able to recognize when she is anxious and will often take a few minutes to identify the stressor and calm herself. She is rarely taking Vistaril.  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 She identified several financial barriers but is looking into options.  Taking meds as prescribed and denies SE.    Visit Diagnosis:    ICD-10-CM   1. Severe episode of recurrent major depressive disorder, without psychotic features (HCC) F33.2 venlafaxine XR (EFFEXOR-XR) 75 MG 24 hr capsule  2. GAD (generalized anxiety disorder) F41.1 venlafaxine XR (EFFEXOR-XR) 75 MG 24 hr capsule       Past Psychiatric History:  Anxiety:Yes Bipolar Disorder:No Depression:Yes Mania:No Psychosis:No Schizophrenia:No Personality Disorder:No Hospitalization for psychiatric illness:Yes History of Electroconvulsive Shock Therapy:No Prior Suicide Attempts:Yes Previous meds: Wellbutrin   Past Medical History:  Past Medical History:  Diagnosis Date  . Anxiety   . Asthma   . Depression   . Environmental allergies    food/medication  . Headache(784.0)   . Hypertension   . Hypokalemia   . Pneumomediastinum (HCC)   . Seasonal allergies   . Suicidal ideations     Past Surgical History:  Procedure Laterality Date  . ABLATION    . BREAST BIOPSY  04/2011  . TUBAL LIGATION      Family Psychiatric History: Family History  Problem Relation Age of Onset  . Heart attack  Mother        Bypass x5  . Allergies Mother   . Diabetes Mellitus II Father   . Hypertension Father   . Allergies Daughter   . Allergies Maternal Grandmother   . Atrial fibrillation Maternal Grandmother   . Stroke Maternal Grandmother   . Suicidality Neg Hx   . Depression Neg Hx   . Anxiety disorder Neg Hx     Social History:  Social History   Social History  . Marital status: Divorced    Spouse name: N/A  . Number of children: N/A  . Years of education: N/A   Occupational History  . 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 Smoking status: Never Smoker  . Smokeless tobacco: Never Used  . Alcohol use Yes     Comment: socially  . Drug use: No  . Sexual activity: Yes    Partners: Male    Birth control/ protection: None   Other Topics Concern  . 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 Lives at home with 2 teenage children. Never smoker.    Allergies:  Allergies  Allergen Reactions  . Nsaids Shortness Of Breath  . Shellfish Allergy Anaphylaxis  . Abilify [Aripiprazole] Other (See Comments)    hallucinations  . Amoxicillin   . Fish Allergy Other (See Comments)    Reaction unknown  .  Latuda [Lurasidone Hcl] Other (See Comments)    Uncontrollable shaking, hopelessness.  Barbera Setters [Levofloxacin In D5w] Other (See Comments)    Reaction unknown  . Other Other (See Comments)    Reaction unknown to nuts  . Penicillins Other (See Comments)    Reaction unknown  . Prednisone Other (See Comments)    Pruritus/ hives  . Seroquel [Quetiapine Fumarate] Other (See Comments)    Patient states it made her not care about the world and she just sat down and didn't get up.  . Sulfa Antibiotics Other (See Comments)    Reaction unknown  . Ativan [Lorazepam] Anxiety    Shakiness  . Xanax [Alprazolam] Anxiety    Makes pt. anxious    Metabolic  Disorder Labs: No results found for: HGBA1C, MPG No results found for: PROLACTIN No results found for: CHOL, TRIG, HDL, CHOLHDL, VLDL, LDLCALC   Current Medications: Current Outpatient Prescriptions  Medication Sig Dispense Refill  . albuterol (PROVENTIL HFA;VENTOLIN HFA) 108 (90 BASE) MCG/ACT inhaler Inhale 2 puffs into the lungs every 6 (six) hours as needed for wheezing or shortness of breath. 3 Inhaler 0  . EPINEPHrine (EPIPEN 2-PAK) 0.3 mg/0.3 mL SOAJ injection Use as directed 1 Device   . EPINEPHrine 0.3 mg/0.3 mL IJ SOAJ injection Inject 0.3 mLs (0.3 mg total) into the muscle once. 1 Device 5  . hydrOXYzine (ATARAX/VISTARIL) 10 MG tablet Take 1 tablet (10 mg total) by mouth 3 (three) times daily as needed for anxiety. 90 tablet 2  . ipratropium-albuterol (DUONEB) 0.5-2.5 (3) MG/3ML SOLN Take 3 mLs by nebulization every 6 (six) hours as needed (shortness of breath/wheezing.).    Marland Kitchen Misc Natural Products (TART CHERRY ADVANCED PO) Take by mouth.    Marland Kitchen omeprazole (PRILOSEC) 20 MG capsule Take 1 capsule (20 mg total) by mouth daily.    . Probiotic Product (PROBIOTIC-10 PO) Take by mouth.    . venlafaxine XR (EFFEXOR-XR) 75 MG 24 hr capsule Take 3 capsules (225 mg total) by mouth daily with breakfast. 90 capsule 2   No current facility-administered medications for this visit.     Musculoskeletal: Strength & Muscle Tone: within normal limits Gait & Station: normal Patient leans: N/A  Psychiatric Specialty Exam: Review of Systems  Constitutional: Positive for malaise/fatigue. Negative for chills and fever.  Neurological: Negative for dizziness, tingling, tremors, seizures and weakness.  Psychiatric/Behavioral: Positive for depression. Negative for hallucinations, substance abuse and suicidal ideas. The patient is nervous/anxious. The patient does not have insomnia.     Blood pressure 126/70, pulse 94, height 5' 4.5" (1.638 m), weight 172 lb 12.8 oz (78.4 kg).Body mass index is 29.2  kg/m.  General Appearance: Fairly Groomed  Eye Contact:  Good  Speech:  Clear and Coherent and Normal Rate  Volume:  Normal  Mood:  Depressed  Affect:  Full Range  Thought Process:  Goal Directed and Descriptions of Associations: Intact  Orientation:  Full (Time, Place, and Person)  Thought Content: Logical   Suicidal Thoughts:  No  Homicidal Thoughts:  No  Memory:  Immediate;   Good Recent;   Good Remote;   Good  Judgement:  Good  Insight:  Good  Psychomotor Activity:  Normal  Concentration:  Concentration: Good and Attention Span: Good  Recall:  Good  Fund of Knowledge: Good  Language: Good  Akathisia:  No  Handed:  Right  AIMS (if indicated):  n/a  Assets:  Communication Skills Desire for Improvement Housing Resilience Talents/Skills Transportation Vocational/Educational  ADL's:  Intact  Cognition: WNL  Sleep:  good     Treatment Plan Summary:Medication management  Assessment: MDD-recurrent, severe without psychotic features; GAD; Insomnia   Medication management with supportive therapy. Risks/benefits and SE of the medication discussed. Pt verbalized understanding and verbal consent obtained for treatment.  Affirm with the patient that the medications are taken as ordered. Patient expressed understanding of how their medications were to be used.    Meds: Effexor XR 225mg  po qD for depression and anxiety Vistaril 10mg  po TID prn anxiety- pt is rarely taking it Pt will consider augmetnation with Cytomel for ongoing depression and fatigue  Labs: none  Therapy: brief supportive therapy provided. Discussed psychosocial stressors in detail.     Consultations: encouraged to follow up with PCP  Pt denies SI and is at an acute low risk for suicide. Patient told to call clinic if any problems occur. Patient advised to go to ER if they should develop SI/HI, side effects, or if symptoms worsen. Has crisis numbers to call if needed. Pt verbalized understanding.  F/up  in 2 months or sooner if needed   Oletta DarterSalina Jatia Musa, MD 08/07/2016, 1:05 PM

## 2016-09-03 ENCOUNTER — Ambulatory Visit (INDEPENDENT_AMBULATORY_CARE_PROVIDER_SITE_OTHER): Payer: BLUE CROSS/BLUE SHIELD | Admitting: Psychiatry

## 2016-09-03 DIAGNOSIS — F332 Major depressive disorder, recurrent severe without psychotic features: Secondary | ICD-10-CM

## 2016-09-05 NOTE — Progress Notes (Signed)
   THERAPIST PROGRESS NOTE  Session Time: 3:40-4:40  Participation Level:Active  Behavioral Response:CasualAlertEuthymic  Type of Therapy: Individual Therapy  Treatment Goals addressed: Coping  Interventions:Strength-based  Summary: Nancy Barnes a 46 y.o.femalewho presents with major depressive disorder, recurrent, mild.   Suicidal/Homicidal:Nowithout intent/plan  Therapist Response: Pt. Continues to present with euthymic mood. Pt. Discussed significant developments since last session. Pt. 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 Pt. Is hopeful that the training will allow her greater employment opportunities. Pt. Has been encouraged by her relationships with friends that have been supportive and consistent with her personal values. Pt. Has been engaged in considerable caregiving for her mother and as a result she broke her toe. Pt. Continues to work at [employer] which she states has been tolerable, but again she is hopeful that with her certification will open up future work opportunities. Pt. Discussed developed awareness of shopping addiction. Pt. Was encouraged to notice patterns of her spending behavior. Pt. Indicated that she believes that the behavior is related to feelings of boredom and need for emotional stimulation and engagement in her life. Pt. Was encouraged that in between sessions to look for opportunities to have her emotional needs met and also to sit with the discomfort some. Pt. Was encouraged to use breathing techniques, meditation, grounding series to help develop her tolerance for her uncomfortable feelings which trigger needs to spend.  Plan: Return again in 3-4 weeks.  Diagnosis:Axis I:Depressive Disorder NOS  Axis II:No diagnosis    [name], [facility] 09/05/2016

## 2016-10-07 ENCOUNTER — Ambulatory Visit (INDEPENDENT_AMBULATORY_CARE_PROVIDER_SITE_OTHER): Payer: BLUE CROSS/BLUE SHIELD | Admitting: Psychiatry

## 2016-10-07 DIAGNOSIS — F330 Major depressive disorder, recurrent, mild: Secondary | ICD-10-CM

## 2016-10-07 DIAGNOSIS — F332 Major depressive disorder, recurrent severe without psychotic features: Secondary | ICD-10-CM

## 2016-10-09 ENCOUNTER — Encounter (HOSPITAL_COMMUNITY): Payer: Self-pay | Admitting: Psychiatry

## 2016-10-09 ENCOUNTER — Ambulatory Visit (INDEPENDENT_AMBULATORY_CARE_PROVIDER_SITE_OTHER): Payer: BLUE CROSS/BLUE SHIELD | Admitting: Psychiatry

## 2016-10-09 DIAGNOSIS — G4700 Insomnia, unspecified: Secondary | ICD-10-CM

## 2016-10-09 DIAGNOSIS — F411 Generalized anxiety disorder: Secondary | ICD-10-CM | POA: Diagnosis not present

## 2016-10-09 DIAGNOSIS — Z79899 Other long term (current) drug therapy: Secondary | ICD-10-CM | POA: Diagnosis not present

## 2016-10-09 DIAGNOSIS — F332 Major depressive disorder, recurrent severe without psychotic features: Secondary | ICD-10-CM

## 2016-10-09 MED ORDER — VENLAFAXINE HCL ER 75 MG PO CP24: 225.0000 mg | ORAL_CAPSULE | Freq: Every day | ORAL | 2 refills | Status: DC

## 2016-10-09 MED ORDER — LIOTHYRONINE SODIUM 5 MCG PO TABS: 5.0000 ug | ORAL_TABLET | Freq: Every day | ORAL | 1 refills | Status: DC

## 2016-10-09 NOTE — Progress Notes (Signed)
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 It is very stressful. Pt states she sleeps deeply every night but still feels fatigued on a daily basis. Pt states PCP did check her adrenal gland and thyroid and both were normal.  Anxiety is well controlled for the most part. She takes Vistaril a few times a month.  Pt completed her peer support specialist class. She wants to continue classes and pursue a career in peer support.   Pt reports she is active and engaging in hobbies. Pt denies isolation and anhedonia. Pt is not sure if she is depressed. Pt denies SI/HI.  Pt states-taking meds as prescribed and denies SE.   Visit Diagnosis:    ICD-10-CM   1. Severe episode of recurrent major depressive disorder, without psychotic features (HCC) F33.2   2. GAD (generalized anxiety disorder) F41.1      Past Psychiatric History:  Anxiety:Yes Bipolar Disorder:No Depression:Yes Mania:No Psychosis:No Schizophrenia:No Personality Disorder:No Hospitalization for psychiatric illness:Yes History of Electroconvulsive Shock Therapy:No Prior Suicide Attempts:Yes Previous meds: Wellbutrin  Past Medical History:  Past Medical History:  Diagnosis Date  . Anxiety   . Asthma   . Depression   . Environmental allergies    food/medication  . Headache(784.0)   . Hypertension   . Hypokalemia   . Pneumomediastinum (HCC)   . Seasonal allergies   . Suicidal ideations     Past Surgical History:  Procedure Laterality Date  . ABLATION    . BREAST BIOPSY  04/2011  . TUBAL LIGATION      Family Psychiatric History:  Family History  Problem Relation Age of Onset  . Heart attack Mother        Bypass x5  . Allergies Mother   . Diabetes Mellitus II Father   . Hypertension Father   . Allergies Daughter   .  Allergies Maternal Grandmother   . Atrial fibrillation Maternal Grandmother   . Stroke Maternal Grandmother   . Suicidality Neg Hx   . Depression Neg Hx   . Anxiety disorder Neg Hx     Social History:  Social History   Social History  . Marital status: Divorced    Spouse name: N/A  . Number of children: N/A  . Years of education: N/A   Occupational History  . 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 Smoking status: Never Smoker  . Smokeless tobacco: Never Used  . Alcohol use Yes     Comment: socially  . Drug use: No  . Sexual activity: Yes    Partners: Male    Birth control/ protection: None   Other Topics Concern  . None   Social History Narrative   Works for [employer]. Lives at home with 2 teenage children. Never smoker.    Allergies:  Allergies  Allergen Reactions  . Nsaids Shortness Of Breath  . Shellfish Allergy Anaphylaxis  . Abilify [Aripiprazole] Other (See Comments)    hallucinations  . Amoxicillin   . Fish Allergy Other (See Comments)    Reaction unknown  . Latuda [Lurasidone Hcl] Other (See Comments)    Uncontrollable shaking, hopelessness.  Barbera Setters [Levofloxacin In D5w] Other (  See Comments)    Reaction unknown  . Other Other (See Comments)    Reaction unknown to nuts  . Penicillins Other (See Comments)    Reaction unknown  . Prednisone Other (See Comments)    Pruritus/ hives  . Seroquel [Quetiapine Fumarate] Other (See Comments)    Patient states it made her not care about the world and she just sat down and didn't get up.  . Sulfa Antibiotics Other (See Comments)    Reaction unknown  . Ativan [Lorazepam] Anxiety    Shakiness  . Xanax [Alprazolam] Anxiety    Makes pt. anxious    Metabolic Disorder Labs: No results found for: HGBA1C, MPG No results found for: PROLACTIN No results found for: CHOL, TRIG, HDL, CHOLHDL, VLDL,  LDLCALC No results found for: TSH  Therapeutic Level Labs: No results found for: LITHIUM No results found for: VALPROATE No components found for:  CBMZ  Current Medications: Current Outpatient Prescriptions  Medication Sig Dispense Refill  . albuterol (PROVENTIL HFA;VENTOLIN HFA) 108 (90 BASE) MCG/ACT inhaler Inhale 2 puffs into the lungs every 6 (six) hours as needed for wheezing or shortness of breath. 3 Inhaler 0  . Biotin 10 MG CAPS Take by mouth.    . EPINEPHrine (EPIPEN 2-PAK) 0.3 mg/0.3 mL SOAJ injection Use as directed 1 Device   . EPINEPHrine 0.3 mg/0.3 mL IJ SOAJ injection Inject 0.3 mLs (0.3 mg total) into the muscle once. 1 Device 5  . hydrOXYzine (ATARAX/VISTARIL) 10 MG tablet Take 1 tablet (10 mg total) by mouth 3 (three) times daily as needed for anxiety. 90 tablet 2  . ipratropium-albuterol (DUONEB) 0.5-2.5 (3) MG/3ML SOLN Take 3 mLs by nebulization every 6 (six) hours as needed (shortness of breath/wheezing.).    Marland Kitchen Misc Natural Products (TART CHERRY ADVANCED PO) Take by mouth.    Marland Kitchen omeprazole (PRILOSEC) 20 MG capsule Take 1 capsule (20 mg total) by mouth daily.    . Probiotic Product (PROBIOTIC-10 PO) Take by mouth.    . venlafaxine XR (EFFEXOR-XR) 75 MG 24 hr capsule Take 3 capsules (225 mg total) by mouth daily with breakfast. 90 capsule 2   No current facility-administered medications for this visit.      Musculoskeletal: Strength & Muscle Tone: within normal limits Gait & Station: normal Patient leans: N/A  Psychiatric Specialty Exam: Review of Systems  Constitutional: Positive for malaise/fatigue. Negative for chills and fever.  Gastrointestinal: Negative for constipation, diarrhea, heartburn, nausea and vomiting.  Psychiatric/Behavioral: Negative for depression, hallucinations, substance abuse and suicidal ideas. The patient is not nervous/anxious and does not have insomnia.     Blood pressure 130/82, pulse 87, height 5' 4.5" (1.638 m), weight 174 lb 3.2 oz  (79 kg).Body mass index is 29.44 kg/m.  General Appearance: Fairly Groomed  Eye Contact:  Good  Speech:  Clear and Coherent and Normal Rate  Volume:  Normal  Mood:  Euthymic  Affect:  Full Range  Thought Process:  Goal Directed and Descriptions of Associations: Intact  Orientation:  Full (Time, Place, and Person)  Thought Content: Logical   Suicidal Thoughts:  No  Homicidal Thoughts:  No  Memory:  Immediate;   Good Recent;   Good Remote;   Good  Judgement:  Good  Insight:  Good  Psychomotor Activity:  Normal  Concentration:  Concentration: Good and Attention Span: Good  Recall:  Good  Fund of Knowledge: Good  Language: Good  Akathisia:  No  Handed:  Right  AIMS (if indicated): not done  Assets:  Communication Skills Desire for Improvement Housing Leisure Time Curator Vocational/Educational  ADL's:  Intact  Cognition: WNL  Sleep:  Good   Screenings: AUDIT     [facility]  Alcohol Use Disorder Identification Test Final Score (AUDIT)  0       Assessment and Plan: MDD-recurrent, severe without psychotic features; GAD; Insomnia   Medication management with supportive therapy. Risks/benefits and SE of the medication discussed. Pt verbalized understanding and verbal consent obtained for treatment.  Affirm with the patient that the medications are taken as ordered. Patient expressed understanding of how their medications were to be used.   Meds: Effexor XR  po qD for depression and anxiety Vistaril  po TID prn anxiety Start trial of Cytomel po qD for mood augmentation and fatigue  Labs: none   Therapy: brief supportive therapy provided. Discussed psychosocial stressors in detail.     Consultations: Encouraged to follow up with PCP as needed  Pt denies SI and is at an acute low risk for suicide. Patient told to call clinic if any problems occur. Patient  advised to go to ER if they should develop SI/HI, side effects, or if symptoms worsen. Has crisis numbers to call if needed. Pt verbalized understanding.  F/up in 2 months or sooner if needed   [name], MD 10/09/2016, 1:48 PM
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
## 2016-10-23 ENCOUNTER — Ambulatory Visit (HOSPITAL_COMMUNITY): Payer: Self-pay | Admitting: Psychiatry

## 2016-11-03 ENCOUNTER — Ambulatory Visit (HOSPITAL_COMMUNITY): Payer: BLUE CROSS/BLUE SHIELD | Admitting: Psychiatry

## 2016-11-03 DIAGNOSIS — F332 Major depressive disorder, recurrent severe without psychotic features: Secondary | ICD-10-CM

## 2016-11-13 NOTE — Progress Notes (Signed)
   THERAPIST PROGRESS NOTE   SSession Time: 1:10-2:00  Participation Level:Active  Behavioral Response:CasualAlertSad  Type of Therapy: Individual Therapy  Treatment Goals addressed: Coping  Interventions:Strength-based  Summary: Nancy BarefootKelly Lawsonis a 46y.o.femalewho presents with major depressive disorder, recurrent, mild.   Suicidal/Homicidal:Nowithout intent/plan  Therapist Response: Pt. Continues to presents with sadness, appropriately tearful, alert, engaged in session. Pt. Discussed romantic relationship. Pt. Discussed acceptance of relationship that she will will not have the future that she would like to have. Pt. Discussed her values regarding relationships and her desire to have love in her life and her sadness that the person that she loves is not able to be with her at this time. Pt. Expressed sadness and longing and wondered whether she will be able to have true love in her life. Pt. Was encouraged to continue to hopeful that because she was able to feel strongly in this instance that it was possible for her to continue to feel in the future. Pt. Reported that she continues to exercise care for herself, sleeping and eating is good. Pt. 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 Pt. Discussed that she does not get up until 10:30 am due to her work schedule and has not started thyroid medication due to her sleep schedule. Counselor checked with CNA about medication and Pt. Was encouraged to go ahead and begin taking medication as precribed by Dr. Alena BillsAgrawal and to take with her first meal.  Plan: Return again in 3-4 weeks.  Diagnosis:Depressive Disorder NOS    Shaune PollackBrown, Hosam Mcfetridge B, [facility] 11/13/2016

## 2016-12-03 ENCOUNTER — Ambulatory Visit (HOSPITAL_COMMUNITY): Payer: Self-pay | Admitting: Psychiatry

## 2016-12-09 NOTE — Addendum Note (Signed)
Addended by: Boneta LucksBROWN, JENNIFER B on: 12/09/2016 03:09 PM   Modules accepted: Level of Service

## 2016-12-11 ENCOUNTER — Ambulatory Visit (HOSPITAL_COMMUNITY): Payer: Self-pay | Admitting: Psychiatry

## 2017-01-15 ENCOUNTER — Encounter (HOSPITAL_COMMUNITY): Payer: Self-pay | Admitting: Psychiatry

## 2017-01-15 ENCOUNTER — Ambulatory Visit (INDEPENDENT_AMBULATORY_CARE_PROVIDER_SITE_OTHER): Payer: BLUE CROSS/BLUE SHIELD | Admitting: Psychiatry

## 2017-01-15 DIAGNOSIS — F5105 Insomnia due to other mental disorder: Secondary | ICD-10-CM | POA: Diagnosis not present

## 2017-01-15 DIAGNOSIS — F99 Mental disorder, not otherwise specified: Secondary | ICD-10-CM

## 2017-01-15 DIAGNOSIS — R450 Nervousness: Secondary | ICD-10-CM | POA: Diagnosis not present

## 2017-01-15 DIAGNOSIS — F411 Generalized anxiety disorder: Secondary | ICD-10-CM | POA: Diagnosis not present

## 2017-01-15 DIAGNOSIS — F332 Major depressive disorder, recurrent severe without psychotic features: Secondary | ICD-10-CM | POA: Diagnosis not present

## 2017-01-15 MED ORDER — VENLAFAXINE HCL ER 75 MG PO CP24: 225.0000 mg | ORAL_CAPSULE | Freq: Every day | ORAL | 2 refills | Status: DC

## 2017-01-15 MED ORDER — LIOTHYRONINE SODIUM 5 MCG PO TABS: 5.0000 ug | ORAL_TABLET | Freq: Every day | ORAL | 1 refills | Status: DC

## 2017-01-15 MED ORDER — HYDROXYZINE HCL 10 MG PO TABS: 10.0000 mg | ORAL_TABLET | Freq: Three times a day (TID) | ORAL | 2 refills | Status: DC | PRN

## 2017-01-15 NOTE — Progress Notes (Signed)
BH MD/PA/NP OP Progress Note  01/15/2017 3:16 PM Nancy Barnes  MRN:  [id number]  Chief Complaint:  Chief Complaint    Depression; Follow-up     HPI: Pt lost her job on Jan 31st. It was unexpected. She does not have another job lined up but is looking for a peer support position. She is still working as a Engineer, water. This has caused depression to come on. She is having crying spells, increased anxiety with panic like symptoms, sad, worthlessness. Sleep is good and she is getting about 7-8 hrs/night. Appetite is good. Energy is generally ok and Cytomel is helping. Pt is motivated and productive at home. Pt denies SI/HI/AVH.   Pt states anxiety is up due to recent job loss. She has restarted Vistaril 10mg  and it calms her. She is taking it once a day because it makes her sleepy.  Pt states-taking meds as prescribed and denies SE.   Visit Diagnosis:    ICD-10-CM   1. Severe episode of recurrent major depressive disorder, without psychotic features (HCC) F33.2   2. GAD (generalized anxiety disorder) F41.1   3. Insomnia due to other mental disorder F51.05    F99       Past Psychiatric History:  Anxiety: Yes Bipolar Disorder: No Depression: Yes Mania: No Psychosis: No Schizophrenia: No Personality Disorder: No Hospitalization for psychiatric illness: Yes History of Electroconvulsive Shock Therapy: No Prior Suicide Attempts: Yes Previous meds: Wellbutrin   Past Medical History:  Past Medical History:  Diagnosis Date  . Anxiety   . Asthma   . Depression   . Environmental allergies    food/medication  . Headache(784.0)   . Hypertension   . Hypokalemia   . Pneumomediastinum (HCC)   . Seasonal allergies   . Suicidal ideations     Past Surgical History:  Procedure Laterality Date  . ABLATION    . BREAST BIOPSY  04/2011  . TUBAL LIGATION      Family Psychiatric History: Family History  Problem Relation Age of Onset  . Heart attack Mother        Bypass x5   . Allergies Mother   . Diabetes Mellitus II Father   . Hypertension Father   . Allergies Daughter   . Allergies Maternal Grandmother   . Atrial fibrillation Maternal Grandmother   . Stroke Maternal Grandmother   . Suicidality Neg Hx   . Depression Neg Hx   . Anxiety disorder Neg Hx     Social History:  Social History   Socioeconomic History  . Marital status: Divorced    Spouse name: None  . Number of children: None  . Years of education: None  . Highest education level: None  Social Needs  . Financial resource strain: None  . Food insecurity - worry: None  . Food insecurity - inability: None  . Transportation needs - medical: None  . Transportation needs - non-medical: None  Occupational History  . 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 Occupation: Engineer, water  Tobacco Use  . Smoking status: Never Smoker  . Smokeless tobacco: Never Used  Substance and Sexual Activity  . Alcohol use: Yes    Comment: socially  . Drug use: No  . Sexual activity: Not Currently    Partners: Male    Birth control/protection: None  Other Topics Concern  . None  Social History Narrative   Works for [employer]. Lives  at home with 2 teenage children. Never smoker.    Allergies:  Allergies  Allergen Reactions  . Nsaids Shortness Of Breath  . Shellfish Allergy Anaphylaxis  . Abilify [Aripiprazole] Other (See Comments)    hallucinations  . Amoxicillin   . Fish Allergy Other (See Comments)    Reaction unknown  . Latuda [Lurasidone Hcl] Other (See Comments)    Uncontrollable shaking, hopelessness.  Barbera Setters. Levaquin [Levofloxacin In D5w] Other (See Comments)    Reaction unknown  . Other Other (See Comments)    Reaction unknown to nuts  . Penicillins Other (See Comments)    Reaction unknown  . Prednisone Other (See Comments)    Pruritus/ hives  . Seroquel [Quetiapine Fumarate] Other (See  Comments)    Patient states it made her not care about the world and she just sat down and didn't get up.  . Sulfa Antibiotics Other (See Comments)    Reaction unknown  . Ativan [Lorazepam] Anxiety    Shakiness  . Xanax [Alprazolam] Anxiety    Makes pt. anxious    Metabolic Disorder Labs: No results found for: HGBA1C, MPG No results found for: PROLACTIN No results found for: CHOL, TRIG, HDL, CHOLHDL, VLDL, LDLCALC No results found for: TSH  Therapeutic Level Labs: No results found for: LITHIUM No results found for: VALPROATE No components found for:  CBMZ  Current Medications: Current Outpatient Medications  Medication Sig Dispense Refill  . albuterol (PROVENTIL HFA;VENTOLIN HFA) 108 (90 BASE) MCG/ACT inhaler Inhale 2 puffs into the lungs every 6 (six) hours as needed for wheezing or shortness of breath. 3 Inhaler 0  . cholecalciferol (VITAMIN D) 1000 units tablet Take 1,000 Units by mouth daily.    . hydrOXYzine (ATARAX/VISTARIL) 10 MG tablet Take 1 tablet (10 mg total) by mouth 3 (three) times daily as needed for anxiety. 90 tablet 2  . ipratropium-albuterol (DUONEB) 0.5-2.5 (3) MG/3ML SOLN Take 3 mLs by nebulization every 6 (six) hours as needed (shortness of breath/wheezing.).    Marland Kitchen. liothyronine (CYTOMEL) 5 MCG tablet Take 1 tablet (5 mcg total) by mouth daily. 30 tablet 1  . Magnesium 250 MG TABS Take 250 mg by mouth daily.    . Misc Natural Products (TART CHERRY ADVANCED PO) Take by mouth.    Marland Kitchen. omeprazole (PRILOSEC) 20 MG capsule Take 1 capsule (20 mg total) by mouth daily.    . Probiotic Product (PROBIOTIC-10 PO) Take by mouth.    . venlafaxine XR (EFFEXOR-XR) 75 MG 24 hr capsule Take 3 capsules (225 mg total) by mouth daily with breakfast. 90 capsule 2  . Biotin 10 MG CAPS Take by mouth.    . EPINEPHrine (EPIPEN 2-PAK) 0.3 mg/0.3 mL SOAJ injection Use as directed 1 Device   . EPINEPHrine 0.3 mg/0.3 mL IJ SOAJ injection Inject 0.3 mLs (0.3 mg total) into the muscle once.  (Patient not taking: Reported on 01/15/2017) 1 Device 5   No current facility-administered medications for this visit.      Musculoskeletal: Strength & Muscle Tone: within normal limits Gait & Station: normal Patient leans: N/A  Psychiatric Specialty Exam: Review of Systems  Constitutional: Negative for chills, fever and malaise/fatigue.  Neurological: Negative for weakness.  Psychiatric/Behavioral: Positive for depression. Negative for hallucinations, substance abuse and suicidal ideas. The patient is nervous/anxious. The patient does not have insomnia.     Blood pressure (!) 136/102, pulse 90, height 5\' 6"  (1.676 m), weight 174 lb (78.9 kg), SpO2 97 %.Body mass index is 28.08 kg/m.  General  Appearance: Fairly Groomed  Eye Contact:  Good  Speech:  Clear and Coherent and Normal Rate  Volume:  Normal  Mood:  Anxious and Depressed  Affect:  Congruent  Thought Process:  Goal Directed and Descriptions of Associations: Intact  Orientation:  Full (Time, Place, and Person)  Thought Content: Logical   Suicidal Thoughts:  No  Homicidal Thoughts:  No  Memory:  Immediate;   Good Recent;   Good Remote;   Good  Judgement:  Good  Insight:  Good  Psychomotor Activity:  Normal  Concentration:  Concentration: Good and Attention Span: Good  Recall:  Good  Fund of Knowledge: Good  Language: Good  Akathisia:  No  Handed:  Right  AIMS (if indicated): not done  Assets:  Communication Skills Desire for Improvement Housing Resilience Social Support Talents/Skills Transportation  ADL's:  Intact  Cognition: WNL  Sleep:  Good   Screenings: AUDIT     [facility]  Alcohol Use Disorder Identification Test Final Score (AUDIT)  0       Assessment and Plan: MDD-recurrent, severe without psychotic features- worsening; GAD- worsening; Insomnia- stable    Medication management with supportive therapy. Risks/benefits  and SE of the medication discussed. Pt verbalized understanding and verbal consent obtained for treatment.  Affirm with the patient that the medications are taken as ordered. Patient expressed understanding of how their medications were to be used.   Meds:  Effexor XR 225mg  po qD for depression and anxiety Vistaril 10mg  po TID prn anxiety Cytomel po qD for depression augmentation and fatigue   Labs: none  Therapy: brief supportive therapy provided. Discussed psychosocial stressors in detail.     Consultations: Encouraged to follow up with PCP as needed  Pt denies SI and is at an acute low risk for suicide. Patient told to call clinic if any problems occur. Patient advised to go to ER if they should develop SI/HI, side effects, or if symptoms worsen. Has crisis numbers to call if needed. Pt verbalized understanding.  F/up in 2 months or sooner if needed    [name], MD 01/15/2017, 3:16 PM

## 2017-01-25 ENCOUNTER — Emergency Department (HOSPITAL_COMMUNITY)
Admission: EM | Admit: 2017-01-25 | Discharge: 2017-01-25 | Disposition: A | Discharge status: Home / Self Care | Payer: BLUE CROSS/BLUE SHIELD | Attending: Emergency Medicine | Admitting: Emergency Medicine

## 2017-01-25 ENCOUNTER — Emergency Department (HOSPITAL_COMMUNITY): Payer: BLUE CROSS/BLUE SHIELD

## 2017-01-25 ENCOUNTER — Other Ambulatory Visit: Payer: Self-pay

## 2017-01-25 ENCOUNTER — Encounter (HOSPITAL_COMMUNITY): Payer: Self-pay | Admitting: Emergency Medicine

## 2017-01-25 DIAGNOSIS — Z79899 Other long term (current) drug therapy: Secondary | ICD-10-CM | POA: Insufficient documentation

## 2017-01-25 DIAGNOSIS — J45901 Unspecified asthma with (acute) exacerbation: Secondary | ICD-10-CM | POA: Insufficient documentation

## 2017-01-25 DIAGNOSIS — I10 Essential (primary) hypertension: Secondary | ICD-10-CM | POA: Insufficient documentation

## 2017-01-25 MED ORDER — DIPHENHYDRAMINE HCL 12.5 MG/5ML PO ELIX
12.5000 mg | ORAL_SOLUTION | Freq: Once | ORAL | Status: AC
  Administered 2017-01-25: 12.5 mg via ORAL
  Filled 2017-01-25: qty 5

## 2017-01-25 MED ORDER — METHYLPREDNISOLONE SODIUM SUCC 125 MG IJ SOLR
125.0000 mg | Freq: Once | INTRAMUSCULAR | Status: AC
  Administered 2017-01-25: 125 mg via INTRAMUSCULAR
  Filled 2017-01-25: qty 2

## 2017-01-25 MED ORDER — ALBUTEROL SULFATE (2.5 MG/3ML) 0.083% IN NEBU
2.5000 mg | INHALATION_SOLUTION | Freq: Once | RESPIRATORY_TRACT | Status: AC
  Administered 2017-01-25: 2.5 mg via RESPIRATORY_TRACT

## 2017-01-25 MED ORDER — IPRATROPIUM-ALBUTEROL 0.5-2.5 (3) MG/3ML IN SOLN
RESPIRATORY_TRACT | Status: AC
  Filled 2017-01-25: qty 3

## 2017-01-25 MED ORDER — ALBUTEROL SULFATE (2.5 MG/3ML) 0.083% IN NEBU: 5.0000 mg | INHALATION_SOLUTION | Freq: Once | RESPIRATORY_TRACT | Status: DC

## 2017-01-25 MED ORDER — ALBUTEROL SULFATE HFA 108 (90 BASE) MCG/ACT IN AERS
2.0000 | INHALATION_SPRAY | Freq: Once | RESPIRATORY_TRACT | Status: AC
  Administered 2017-01-25: 2 via RESPIRATORY_TRACT
  Filled 2017-01-25: qty 6.7

## 2017-01-25 MED ORDER — IPRATROPIUM BROMIDE 0.02 % IN SOLN: 0.5000 mg | Freq: Once | RESPIRATORY_TRACT | Status: DC

## 2017-01-25 MED ORDER — ALBUTEROL SULFATE (2.5 MG/3ML) 0.083% IN NEBU
INHALATION_SOLUTION | RESPIRATORY_TRACT | Status: AC
  Filled 2017-01-25: qty 3

## 2017-01-25 MED ORDER — PROMETHAZINE-DM 6.25-15 MG/5ML PO SYRP: 5.0000 mL | ORAL_SOLUTION | Freq: Four times a day (QID) | ORAL | 1 refills | Status: DC | PRN

## 2017-01-25 MED ORDER — IPRATROPIUM-ALBUTEROL 0.5-2.5 (3) MG/3ML IN SOLN
3.0000 mL | Freq: Once | RESPIRATORY_TRACT | Status: AC
  Administered 2017-01-25: 3 mL via RESPIRATORY_TRACT
  Filled 2017-01-25: qty 3

## 2017-01-25 NOTE — ED Triage Notes (Signed)
Patient c/o productive cough with thick clear sputum. Denies any fevers. Patient reports wheezing-hx of asthma. Patient states using breathing treatments, inhaler, and humidifier with no relief.

## 2017-01-25 NOTE — ED Provider Notes (Signed)
[facility] EMERGENCY DEPARTMENT Provider Note   CSN: [id number] Arrival date & time: 01/25/17  1622     History   Chief Complaint Chief Complaint  Patient presents with  . Cough    HPI Nancy Barnes is a 47 y.o. female.  Patient is a 47 year old female who presents to the emergency department with a complaint of cough and wheezing.  Patient states that she has been bothered with increasing cough, wheezing, and sensation of shortness of breath over the neck last 3 or 4 days.  She now notices a thick clear productive sputum with cough.  She states she has been trying her albuterol inhaler as well as some of her other inhalers.  These do not seem to be working.  The patient is concerned because she is had a problem in the past with pneumomediastinum and other lung related issues.  She denies fever or chills.  She denies hemoptysis.  She denies injury to her chest or lung area.  She presents now for assistance with these issues.      Past Medical History:  Diagnosis Date  . Anxiety   . Asthma   . Depression   . Environmental allergies    food/medication  . Headache(784.0)   . Hypertension   . Hypokalemia   . Pneumomediastinum (HCC)   . Seasonal allergies   . Suicidal ideations     Patient Active Problem List   Diagnosis Date Noted  . GAD (generalized anxiety disorder) 11/03/2013  . Major psychotic depression, recurrent (HCC) 11/03/2013  . Medication management 03/19/2013  . Seasonal and perennial allergic rhinitis 02/29/2012  . Food allergy 02/29/2012  . Medication intolerance 02/29/2012  . Asthma, intrinsic 12/23/2011  . Asthma with acute exacerbation 12/09/2011  . Pneumomediastinum (HCC) 12/09/2011  . Depression 12/09/2011  . Anxiety 12/09/2011    Past Surgical History:  Procedure Laterality Date  . ABLATION    . BREAST BIOPSY  04/2011  . TUBAL LIGATION      OB History    No data available       Home Medications    Prior to Admission medications     Medication Sig Start Date End Date Taking? Authorizing Provider  albuterol (PROVENTIL HFA;VENTOLIN HFA) 108 (90 BASE) MCG/ACT inhaler Inhale 2 puffs into the lungs every 6 (six) hours as needed for wheezing or shortness of breath. 06/09/13  Yes Young, Clinton D, MD  cholecalciferol (VITAMIN D) 1000 units tablet Take 1,000 Units by mouth daily.   Yes [provider]  Fluticasone-Salmeterol (ADVAIR) 250-50 MCG/DOSE AEPB Inhale 1 puff into the lungs 2 (two) times daily.   Yes [provider]  hydrOXYzine (ATARAX/VISTARIL) 10 MG tablet Take 1 tablet (10 mg total) by mouth 3 (three) times daily as needed for anxiety. 01/15/17  Yes [name], MD  ipratropium-albuterol (DUONEB) 0.5-2.5 (3) MG/3ML SOLN Take 3 mLs by nebulization every 6 (six) hours as needed (shortness of breath/wheezing.).   Yes [provider]  liothyronine (CYTOMEL) 5 MCG tablet Take 1 tablet (5 mcg total) by mouth daily. 01/15/17  Yes [name], MD  Magnesium 250 MG TABS Take 250 mg by mouth daily.   Yes [provider]  omeprazole (PRILOSEC) 20 MG capsule Take 1 capsule (20 mg total) by mouth daily. 03/26/13  Yes Charm Rings, NP  Probiotic Product (PROBIOTIC-10 PO) Take 1 tablet by mouth daily.    Yes [provider]  Pseudoephedrine-DM-GG (EQL TUSSIN CF CGH/COLD/CONGEST PO) Take 10 mLs by mouth every 4 (  four) hours as needed (cold and congeestion).   Yes [provider]  venlafaxine XR (EFFEXOR-XR) 75 MG 24 hr capsule Take 3 capsules (225 mg total) by mouth daily with breakfast. 01/15/17  Yes Oletta DarterAgarwal, [place], MD  EPINEPHrine (EPIPEN 2-PAK) 0.3 mg/0.3 mL SOAJ injection Use as directed 03/26/13   Charm RingsLord, Jamison Y, NP  EPINEPHrine 0.3 mg/0.3 mL IJ SOAJ injection Inject 0.3 mLs (0.3 mg total) into the muscle once. Patient not taking: Reported on 01/15/2017 11/18/13   Ward, Layla MawKristen N, DO    Family History Family History  Problem Relation Age of Onset  . Heart attack Mother         Bypass x5  . Allergies Mother   . Diabetes Mellitus II Father   . Hypertension Father   . Allergies Daughter   . Allergies Maternal Grandmother   . Atrial fibrillation Maternal Grandmother   . Stroke Maternal Grandmother   . Suicidality Neg Hx   . Depression Neg Hx   . Anxiety disorder Neg Hx     Social History Social History   Tobacco Use  . Smoking status: Never Smoker  . Smokeless tobacco: Never Used  Substance Use Topics  . Alcohol use: Yes    Comment: socially  . Drug use: No     Allergies   Nsaids; Shellfish allergy; Abilify [aripiprazole]; Amoxicillin; Fish allergy; Latuda [lurasidone hcl]; Levaquin [levofloxacin in d5w]; Other; Penicillins; Prednisone; Seroquel [quetiapine fumarate]; Sulfa antibiotics; Ativan [lorazepam]; and Xanax [alprazolam]   Review of Systems Review of Systems  Constitutional: Negative for activity change.       All ROS Neg except as noted in HPI  HENT: Negative for nosebleeds.   Eyes: Negative for photophobia and discharge.  Respiratory: Positive for cough, shortness of breath and wheezing.        Chest wall pain  Cardiovascular: Negative for chest pain and palpitations.  Gastrointestinal: Negative for abdominal pain and blood in stool.  Genitourinary: Negative for dysuria, frequency and hematuria.  Musculoskeletal: Negative for arthralgias, back pain and neck pain.  Skin: Negative.   Neurological: Negative for dizziness, seizures and speech difficulty.  Psychiatric/Behavioral: Negative for confusion and hallucinations.     Physical Exam Updated Vital Signs Pulse 94   Temp 98.6 F (37 C) (Oral)   Resp 18   Ht 5' 4.5" (1.638 m)   Wt 78.9 kg (174 lb)   SpO2 100%   BMI 29.41 kg/m   Physical Exam  Constitutional: She is oriented to person, place, and time. She appears well-developed and well-nourished.  Non-toxic appearance.  HENT:  Head: Normocephalic.  Right Ear: Tympanic membrane and external ear normal.  Left Ear:  Tympanic membrane and external ear normal.  Eyes: EOM and lids are normal. Pupils are equal, round, and reactive to light.  Neck: Normal range of motion. Neck supple. Carotid bruit is not present.  Cardiovascular: Normal rate, regular rhythm, normal heart sounds, intact distal pulses and normal pulses.  Pulmonary/Chest: Effort normal. No stridor. No respiratory distress. She has wheezes. She exhibits tenderness.  There is symmetrical rise and fall of the chest.  Patient speaks in complete sentences without problem.  Abdominal: Soft. Bowel sounds are normal. There is no tenderness. There is no guarding.  Musculoskeletal: Normal range of motion. She exhibits no edema.  Negative Homans sign  Lymphadenopathy:       Head (right side): No submandibular adenopathy present.       Head (left side): No submandibular adenopathy present.    She  has no cervical adenopathy.  Neurological: She is alert and oriented to person, place, and time. She has normal strength. No cranial nerve deficit or sensory deficit.  Skin: Skin is warm and dry.  Psychiatric: She has a normal mood and affect. Her speech is normal.  Nursing note and vitals reviewed.    ED Treatments / Results  Labs (all labs ordered are listed, but only abnormal results are displayed) Labs Reviewed - No data to display  EKG  EKG Interpretation None       Radiology Dg Chest 2 View  Result Date: 01/25/2017 CLINICAL DATA:  Wheezing, shortness of breath and cough EXAM: CHEST  2 VIEW COMPARISON:  October 21, 2012 FINDINGS: The heart size and mediastinal contours are within normal limits. Both lungs are clear. The visualized skeletal structures are unremarkable. IMPRESSION: No active cardiopulmonary disease. Electronically Signed   By: Sherian Rein M.D.   On: 01/25/2017 17:46    Procedures Procedures (including critical care time)  Medications Ordered in ED Medications  albuterol (PROVENTIL) (2.5 MG/3ML) 0.083% nebulizer solution (   Not Given 01/25/17 1730)  methylPREDNISolone sodium succinate (SOLU-MEDROL) 125 mg/2 mL injection 125 mg (125 mg Intramuscular Given 01/25/17 1656)  diphenhydrAMINE (BENADRYL) 12.5 MG/5ML elixir 12.5 mg (12.5 mg Oral Given 01/25/17 1656)  ipratropium-albuterol (DUONEB) 0.5-2.5 (3) MG/3ML nebulizer solution 3 mL (3 mLs Nebulization Given 01/25/17 1725)  albuterol (PROVENTIL) (2.5 MG/3ML) 0.083% nebulizer solution 2.5 mg (2.5 mg Nebulization Given 01/25/17 1725)     Initial Impression / Assessment and Plan / ED Course  I have reviewed the triage vital signs and the nursing notes.  Pertinent labs & imaging results that were available during my care of the patient were reviewed by me and considered in my medical decision making (see chart for details).       Final Clinical Impressions(s) / ED Diagnoses MDM Vital signs within normal limits.  Pulse oximetry is 100% on room air.  Within normal limits by my interpretation.  Patient has a history of asthma.  She states from time to time she has an exacerbation of this asthma and this feels very similar.  She feels as though the current medications are not working effectively.  Chest x-ray is negative for acute findings.  Patient was treated in the emergency department with albuterol and intramuscular Solu-Medrol.  The patient was rechecked.  The wheezing has almost completely resolved.  The patient is breathing a lot easier.  There is symmetrical rise and fall of the chest.  And the patient speaks in complete sentences.  She is in no distress.  I have asked the patient to continue her current medications from her physician.  She is given an albuterol inhaler to use.  She will also use promethazine DM for cough.  She will use Tylenol every 4 hours for fever or aching.  Short course of steroid was offered, but the patient states she would rather not try any steroid since she had issue with prednisone in the past.  I have asked the patient to see her primary  physician next week to follow-up.  The patient will return to the emergency department if any changes or problems before she is seen by Dr. Reuel Boom in the office.   Final diagnoses:  Moderate asthma with exacerbation, unspecified whether persistent    ED Discharge Orders        Ordered    promethazine-dextromethorphan (PROMETHAZINE-DM) 6.25-15 MG/5ML syrup  4 times daily PRN     01/25/17 1818  Ivery Quale, PA-C 01/25/17 1825    Samuel Jester, DO 01/27/17 1517

## 2017-01-25 NOTE — Discharge Instructions (Signed)
Please increase fluids.  Please wash hands frequently.  Use your albuterol 2 puffs every 4 hours for wheezing, shortness of breath, or cough.  Use promethazine DM for cough.  Please continue your current medications.  Please call Dr. Reuel Boomaniel tomorrow and set up an appointment for next week.

## 2017-03-19 ENCOUNTER — Encounter (HOSPITAL_COMMUNITY): Payer: Self-pay | Admitting: Psychiatry

## 2017-03-19 ENCOUNTER — Ambulatory Visit (INDEPENDENT_AMBULATORY_CARE_PROVIDER_SITE_OTHER): Payer: Self-pay | Admitting: Psychiatry

## 2017-03-19 DIAGNOSIS — F411 Generalized anxiety disorder: Secondary | ICD-10-CM

## 2017-03-19 DIAGNOSIS — R45851 Suicidal ideations: Secondary | ICD-10-CM

## 2017-03-19 DIAGNOSIS — R450 Nervousness: Secondary | ICD-10-CM

## 2017-03-19 DIAGNOSIS — F332 Major depressive disorder, recurrent severe without psychotic features: Secondary | ICD-10-CM

## 2017-03-19 DIAGNOSIS — F99 Mental disorder, not otherwise specified: Secondary | ICD-10-CM

## 2017-03-19 DIAGNOSIS — Z560 Unemployment, unspecified: Secondary | ICD-10-CM

## 2017-03-19 DIAGNOSIS — F5105 Insomnia due to other mental disorder: Secondary | ICD-10-CM

## 2017-03-19 MED ORDER — VENLAFAXINE HCL ER 75 MG PO CP24: 225.0000 mg | ORAL_CAPSULE | Freq: Every day | ORAL | 2 refills | Status: DC

## 2017-03-19 MED ORDER — LIOTHYRONINE SODIUM 5 MCG PO TABS: 5.0000 ug | ORAL_TABLET | Freq: Every day | ORAL | 1 refills | Status: DC

## 2017-03-19 NOTE — Progress Notes (Signed)
[facility] MD/PA/NP OP Progress Note  03/19/2017 9:53 AM Nancy Barnes  MRN:  [id number]  Chief Complaint:  Chief Complaint    Depression; Follow-up     HPI: "Still no job". It is making her depressed for the last 3 weeks on daily. She is having crying spells, is unmotivated and has spent several days in bed. She is having to visit her dad everyday because he broke his ankle about 2 months ago. Her finances are strained and she feels bad that her dad and kids are paying for things.  Pt is actively pursuing jobs- she is taking classes for psychology and wants to get a degree, she is applying for jobs and is thinking of starting a support alliance in [place]. Pt is forcing herself to be active and asks her kids to force her out of bed. Sleep is good.  Energy is good with Cytomel. Pt denies hopelessness but sometimes feels worthlessness. Pt had passive thoughts of death that started last week. Since then it has come on/off but she denies plan or intent. She admits she has access to guns but has not intention to use it and feels safe with them. Pt denies HI/AVH.    Pt had a stress induced panic attack right after helping someone needing an ambulance. She went home and took Vistaril 25mg  and went to sleep. Her anxiety is very high due to her ongoing problems. Pt is trying to work on and use coping skills. She is knitting, journeling and meditating.  Pt takes Vistaril 10mg  twice a week.   Pt states-taking meds as prescribed and denies SE.   Visit Diagnosis:    ICD-10-CM   1. Severe episode of recurrent major depressive disorder, without psychotic features (HCC) F33.2 liothyronine (CYTOMEL) 5 MCG tablet    venlafaxine XR (EFFEXOR-XR) 75 MG 24 hr capsule  2. GAD (generalized anxiety disorder) F41.1 venlafaxine XR (EFFEXOR-XR) 75 MG 24 hr capsule  3. Insomnia due to other mental disorder F51.05    F99       Past Psychiatric History:  Anxiety: Yes Bipolar Disorder: No Depression: Yes Mania:  No Psychosis: No Schizophrenia: No Personality Disorder: No Hospitalization for psychiatric illness: Yes History of Electroconvulsive Shock Therapy: No Prior Suicide Attempts: Yes Previous meds: Wellbutrin   Past Medical History:  Past Medical History:  Diagnosis Date  . Anxiety   . Asthma   . Depression   . Environmental allergies    food/medication  . Headache(784.0)   . Hypertension   . Hypokalemia   . Pneumomediastinum (HCC)   . Seasonal allergies   . Suicidal ideations     Past Surgical History:  Procedure Laterality Date  . ABLATION    . BREAST BIOPSY  04/2011  . TUBAL LIGATION      Family Psychiatric History:  Family History  Problem Relation Age of Onset  . Heart attack Mother        Bypass x5  . Allergies Mother   . Diabetes Mellitus II Father   . Hypertension Father   . Allergies Daughter   . Allergies Maternal Grandmother   . Atrial fibrillation Maternal Grandmother   . Stroke Maternal Grandmother   . Suicidality Neg Hx   . Depression Neg Hx   . Anxiety disorder Neg Hx     Social History:  Social History   Socioeconomic History  . Marital status: Divorced    Spouse name: None  . Number of children: None  . Years of education: None  .  Highest education level: None  Social Needs  . Financial resource strain: None  . Food insecurity - worry: None  . Food insecurity - inability: None  . Transportation needs - medical: None  . Transportation needs - non-medical: None  Occupational History  . 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 Occupation: Engineer, water  Tobacco Use  . Smoking status: Never Smoker  . Smokeless tobacco: Never Used  Substance and Sexual Activity  . Alcohol use: Yes    Comment: socially  . Drug use: No  . Sexual activity: Not Currently    Partners: Male    Birth control/protection: None  Other Topics Concern  . None  Social History  Narrative   Works for [employer]. Lives at home with 2 teenage children. Never smoker.    Allergies:  Allergies  Allergen Reactions  . Nsaids Shortness Of Breath  . Shellfish Allergy Anaphylaxis  . Abilify [Aripiprazole] Other (See Comments)    hallucinations  . Amoxicillin   . Fish Allergy Other (See Comments)    Reaction unknown  . Latuda [Lurasidone Hcl] Other (See Comments)    Uncontrollable shaking, hopelessness.  Barbera Setters [Levofloxacin In D5w] Other (See Comments)    Reaction unknown  . Other Other (See Comments)    Reaction unknown to nuts  . Penicillins Other (See Comments)    Reaction unknown Has patient had a PCN reaction causing immediate rash, facial/tongue/throat swelling, SOB or lightheadedness with hypotension: Yes Has patient had a PCN reaction causing severe rash involving mucus membranes or skin necrosis: No Has patient had a PCN reaction that required hospitalization: No Has patient had a PCN reaction occurring within the last 10 years: No If all of the above answers are "NO", then may proceed with Cephalosporin use.  . Prednisone Other (See Comments)    Pruritus/ hives  . Seroquel [Quetiapine Fumarate] Other (See Comments)    Patient states it made her not care about the world and she just sat down and didn't get up.  . Sulfa Antibiotics Other (See Comments)    Reaction unknown  . Ativan [Lorazepam] Anxiety    Shakiness  . Xanax [Alprazolam] Anxiety    Makes pt. anxious    Metabolic Disorder Labs: No results found for: HGBA1C, MPG No results found for: PROLACTIN No results found for: CHOL, TRIG, HDL, CHOLHDL, VLDL, LDLCALC No results found for: TSH  Therapeutic Level Labs: No results found for: LITHIUM No results found for: VALPROATE No components found for:  CBMZ  Current Medications: Current Outpatient Medications  Medication Sig Dispense Refill  . albuterol (PROVENTIL HFA;VENTOLIN HFA) 108 (90 BASE) MCG/ACT inhaler Inhale  2 puffs into the lungs every 6 (six) hours as needed for wheezing or shortness of breath. 3 Inhaler 0  . cholecalciferol (VITAMIN D) 1000 units tablet Take 1,000 Units by mouth daily.    Marland Kitchen EPINEPHrine (EPIPEN 2-PAK) 0.3 mg/0.3 mL SOAJ injection Use as directed 1 Device   . EPINEPHrine 0.3 mg/0.3 mL IJ SOAJ injection Inject 0.3 mLs (0.3 mg total) into the muscle once. 1 Device 5  . Fluticasone-Salmeterol (ADVAIR) 250-50 MCG/DOSE AEPB Inhale 1 puff into the lungs 2 (two) times daily.    . hydrOXYzine (ATARAX/VISTARIL) 10 MG tablet Take 1 tablet (10 mg total) by mouth 3 (three) times daily as needed for anxiety. 90 tablet 2  . ipratropium-albuterol (DUONEB) 0.5-2.5 (3) MG/3ML SOLN Take 3 mLs by nebulization  every 6 (six) hours as needed (shortness of breath/wheezing.).    Marland Kitchen liothyronine (CYTOMEL) 5 MCG tablet Take 1 tablet (5 mcg total) by mouth daily. 30 tablet 1  . Magnesium 250 MG TABS Take 250 mg by mouth daily.    Marland Kitchen omeprazole (PRILOSEC) 20 MG capsule Take 1 capsule (20 mg total) by mouth daily.    . Probiotic Product (PROBIOTIC-10 PO) Take 1 tablet by mouth daily.     . promethazine-dextromethorphan (PROMETHAZINE-DM) 6.25-15 MG/5ML syrup Take 5 mLs by mouth 4 (four) times daily as needed for cough. 120 mL 1  . Pseudoephedrine-DM-GG (EQL TUSSIN CF CGH/COLD/CONGEST PO) Take 10 mLs by mouth every 4 (four) hours as needed (cold and congeestion).    . venlafaxine XR (EFFEXOR-XR) 75 MG 24 hr capsule Take 3 capsules (225 mg total) by mouth daily with breakfast. 90 capsule 2   No current facility-administered medications for this visit.      Musculoskeletal: Strength & Muscle Tone: within normal limits Gait & Station: normal Patient leans: N/A  Psychiatric Specialty Exam: Review of Systems  Constitutional: Negative for chills, fever and malaise/fatigue.  Neurological: Negative for weakness.  Psychiatric/Behavioral: Positive for depression and suicidal ideas. Negative for hallucinations and  substance abuse. The patient is nervous/anxious. The patient does not have insomnia.     Blood pressure 132/88, pulse 93, height 5\' 5"  (1.651 m), weight 176 lb (79.8 kg), SpO2 98 %.Body mass index is 29.29 kg/m.  General Appearance: Fairly Groomed  Eye Contact:  Good  Speech:  Clear and Coherent and Normal Rate  Volume:  Normal  Mood:  Anxious and Depressed  Affect:  Congruent and Tearful  Thought Process:  Goal Directed and Descriptions of Associations: Intact  Orientation:  Full (Time, Place, and Person)  Thought Content: Logical   Suicidal Thoughts:  Yes.  without intent/plan  Homicidal Thoughts:  No  Memory:  Immediate;   Good Recent;   Good Remote;   Good  Judgement:  Good  Insight:  Good  Psychomotor Activity:  Normal  Concentration:  Concentration: Good and Attention Span: Good  Recall:  Good  Fund of Knowledge: Good  Language: Good  Akathisia:  No  Handed:  Right  AIMS (if indicated): not done  Assets:  Communication Skills Desire for Improvement Housing Social Support  ADL's:  Intact  Cognition: WNL  Sleep:  Good   Screenings: AUDIT     [facility]  Alcohol Use Disorder Identification Test Final Score (AUDIT)  0       Assessment and Plan: MDD-recurrent, severe without psychotic features- worsening; GAD- worsening; insomnia- stable   Medication management with supportive therapy. Risks and benefits, side effects and alternative treatment options discussed with patient. Pt was given an opportunity to ask questions about medication, illness, and treatment. All current psychiatric medications have been reviewed and discussed with the patient and adjusted as clinically appropriate. The patient has been provided an accurate and updated list of the medications being now prescribed. Patient expressed understanding of how their medications were to be used.  Pt verbalized understanding and verbal  consent obtained for treatment.  The risk of un-intended pregnancy is low based on the fact that pt reports she had tubal ligation. Pt is aware that these meds carry a teratogenic risk. Pt will discuss plan of action if she does or plans to become pregnant in the future.  Meds: Effexor XR 225 mg p.o. daily for MDD and GAD  Vistaril 10 mg p.o. 3 times daily as needed anxiety related to GAD Cytomel 5 mcg p.o. daily for augmentation and fatigue treatment related to MDD Pt does not want meds changed today  Labs: ordered TSH  Therapy: brief supportive therapy provided. Discussed psychosocial stressors in detail.     Consultations: none  Pt reports passive thoughts of death but denies plan or intent and is at an acute low risk for suicide. Patient told to call clinic if any problems occur. Patient advised to go to ER if they should develop SI/HI, side effects, or if symptoms worsen. Has crisis numbers to call if needed. Pt verbalized understanding.  F/up in 6 weeks or sooner if needed   Oletta DarterSalina Rasheen Bells, MD 03/19/2017, 9:53 AM

## 2017-05-14 ENCOUNTER — Encounter (HOSPITAL_COMMUNITY): Payer: Self-pay | Admitting: Psychiatry

## 2017-05-14 ENCOUNTER — Ambulatory Visit (INDEPENDENT_AMBULATORY_CARE_PROVIDER_SITE_OTHER): Payer: Self-pay | Admitting: Psychiatry

## 2017-05-14 DIAGNOSIS — Z79899 Other long term (current) drug therapy: Secondary | ICD-10-CM

## 2017-05-14 DIAGNOSIS — F5105 Insomnia due to other mental disorder: Secondary | ICD-10-CM

## 2017-05-14 DIAGNOSIS — F411 Generalized anxiety disorder: Secondary | ICD-10-CM

## 2017-05-14 DIAGNOSIS — F332 Major depressive disorder, recurrent severe without psychotic features: Secondary | ICD-10-CM

## 2017-05-14 DIAGNOSIS — F99 Mental disorder, not otherwise specified: Secondary | ICD-10-CM

## 2017-05-14 MED ORDER — VENLAFAXINE HCL ER 75 MG PO CP24: 225.0000 mg | ORAL_CAPSULE | Freq: Every day | ORAL | 2 refills | Status: DC

## 2017-05-14 MED ORDER — LIOTHYRONINE SODIUM 5 MCG PO TABS: 5.0000 ug | ORAL_TABLET | Freq: Every day | ORAL | 2 refills | Status: DC

## 2017-05-14 MED ORDER — HYDROXYZINE HCL 10 MG PO TABS: 10.0000 mg | ORAL_TABLET | Freq: Three times a day (TID) | ORAL | 2 refills | Status: DC | PRN

## 2017-05-14 NOTE — Progress Notes (Signed)
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 She has started working on peer support group. Pt feels "stuck" and not seems to be moving forward.   Her mom paid off her car and she now pays her mom a $100/month.   Depression is "the same". She feels numb. Pt denies crying spells and anhedonia. It is centered on stress. Sleep is ok but energy is on the low side. Cytomel does give her some energy. Pt denies SI/HI.  Pt notes some hair loss and not sure if it due to age.  Anxiety is "ok" and she has not had to take any Vistaril. When she does get anxious she does deep breathing.  Pt states-taking meds as prescribed and denies SE.   Visit Diagnosis:    ICD-10-CM   1. Severe episode of recurrent major depressive disorder, without psychotic features (HCC) F33.2 liothyronine (CYTOMEL) 5 MCG tablet    venlafaxine XR (EFFEXOR-XR) 75 MG 24 hr capsule  2. GAD (generalized anxiety disorder) F41.1 hydrOXYzine (ATARAX/VISTARIL) 10 MG tablet    venlafaxine XR (EFFEXOR-XR) 75 MG 24 hr capsule  3. Insomnia due to other mental disorder F51.05 hydrOXYzine (ATARAX/VISTARIL) 10 MG tablet   F99       Past Psychiatric History:  Anxiety: Yes Bipolar Disorder: No Depression: Yes Mania: No Psychosis: No Schizophrenia: No Personality Disorder: No Hospitalization for psychiatric illness: Yes History of Electroconvulsive Shock Therapy: No Prior Suicide Attempts: Yes Previous meds: Wellbutrin   Past Medical History:  Past Medical History:  Diagnosis Date  . Anxiety   . Asthma   . Depression   . Environmental allergies    food/medication  . Headache(784.0)   . Hypertension   . Hypokalemia   . Pneumomediastinum (HCC)   . Seasonal allergies   . Suicidal ideations     Past Surgical History:  Procedure Laterality Date   . ABLATION    . BREAST BIOPSY  04/2011  . TUBAL LIGATION      Family Psychiatric History:  Family History  Problem Relation Age of Onset  . Heart attack Mother        Bypass x5  . Allergies Mother   . Diabetes Mellitus II Father   . Hypertension Father   . Allergies Daughter   . Allergies Maternal Grandmother   . Atrial fibrillation Maternal Grandmother   . Stroke Maternal Grandmother   . Suicidality Neg Hx   . Depression Neg Hx   . Anxiety disorder Neg Hx     Social History:  Social History   Socioeconomic History  . Marital status: Divorced    Spouse name: Not on file  . Number of children: Not on file  . Years of education: Not on file  . Highest education level: Not on file  Occupational History  . 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 Occupation: Engineer, maintenance (IT)  . Financial resource strain: Not on file  . Food insecurity:    Worry: Not on file    Inability: Not on file  . Transportation needs:    Medical: Not on file    Non-medical: Not on file  Tobacco Use  . Smoking status: Never Smoker  . Smokeless tobacco:  Never Used  Substance and Sexual Activity  . Alcohol use: Yes    Comment: socially  . Drug use: No  . Sexual activity: Not Currently    Partners: Male    Birth control/protection: None  Lifestyle  . Physical activity:    Days per week: Not on file    Minutes per session: Not on file  . Stress: Not on file  Relationships  . Social connections:    Talks on phone: Not on file    Gets together: Not on file    Attends religious service: Not on file    Active member of club or organization: Not on file    Attends meetings of clubs or organizations: Not on file    Relationship status: Not on file  Other Topics Concern  . Not on file  Social History Narrative   Works for [employer]. Lives at home with 2 teenage children. Never smoker.     Allergies:  Allergies  Allergen Reactions  . Nsaids Shortness Of Breath  . Shellfish Allergy Anaphylaxis  . Abilify [Aripiprazole] Other (See Comments)    hallucinations  . Amoxicillin   . Fish Allergy Other (See Comments)    Reaction unknown  . Latuda [Lurasidone Hcl] Other (See Comments)    Uncontrollable shaking, hopelessness.  Barbera Setters [Levofloxacin In D5w] Other (See Comments)    Reaction unknown  . Other Other (See Comments)    Reaction unknown to nuts  . Penicillins Other (See Comments)    Reaction unknown Has patient had a PCN reaction causing immediate rash, facial/tongue/throat swelling, SOB or lightheadedness with hypotension: Yes Has patient had a PCN reaction causing severe rash involving mucus membranes or skin necrosis: No Has patient had a PCN reaction that required hospitalization: No Has patient had a PCN reaction occurring within the last 10 years: No If all of the above answers are "NO", then may proceed with Cephalosporin use.  . Prednisone Other (See Comments)    Pruritus/ hives  . Seroquel [Quetiapine Fumarate] Other (See Comments)    Patient states it made her not care about the world and she just sat down and didn't get up.  . Sulfa Antibiotics Other (See Comments)    Reaction unknown  . Ativan [Lorazepam] Anxiety    Shakiness  . Xanax [Alprazolam] Anxiety    Makes pt. anxious    Metabolic Disorder Labs: No results found for: HGBA1C, MPG No results found for: PROLACTIN No results found for: CHOL, TRIG, HDL, CHOLHDL, VLDL, LDLCALC No results found for: TSH  Therapeutic Level Labs: No results found for: LITHIUM No results found for: VALPROATE No components found for:  CBMZ  Current Medications: Current Outpatient Medications  Medication Sig Dispense Refill  . albuterol (PROVENTIL HFA;VENTOLIN HFA) 108 (90 BASE) MCG/ACT inhaler Inhale 2 puffs into the lungs every 6 (six) hours as needed for wheezing or shortness of breath. 3 Inhaler 0  .  cholecalciferol (VITAMIN D) 1000 units tablet Take 1,000 Units by mouth daily.    . hydrOXYzine (ATARAX/VISTARIL) 10 MG tablet Take 1 tablet (10 mg total) by mouth 3 (three) times daily as needed for anxiety. 90 tablet 2  . ipratropium-albuterol (DUONEB) 0.5-2.5 (3) MG/3ML SOLN Take 3 mLs by nebulization every 6 (six) hours as needed (shortness of breath/wheezing.).    Marland Kitchen liothyronine (CYTOMEL) 5 MCG tablet Take 1 tablet (5 mcg total) by mouth daily. 30 tablet 2  . Magnesium 250 MG TABS Take 250 mg by mouth daily.    Marland Kitchen  omeprazole (PRILOSEC) 20 MG capsule Take 1 capsule (20 mg total) by mouth daily.    . Probiotic Product (PROBIOTIC-10 PO) Take 1 tablet by mouth daily.     Marland Kitchen venlafaxine XR (EFFEXOR-XR) 75 MG 24 hr capsule Take 3 capsules (225 mg total) by mouth daily with breakfast. 90 capsule 2  . Fluticasone-Salmeterol (ADVAIR) 250-50 MCG/DOSE AEPB Inhale 1 puff into the lungs 2 (two) times daily.     No current facility-administered medications for this visit.      Musculoskeletal: Strength & Muscle Tone: within normal limits Gait & Station: normal Patient leans: N/A  Psychiatric Specialty Exam: Review of Systems  Psychiatric/Behavioral: Positive for depression. Negative for hallucinations and suicidal ideas. The patient is nervous/anxious. The patient does not have insomnia.     Blood pressure 128/74, pulse 82, height 5' 4.5" (1.638 m), weight 178 lb (80.7 kg).Body mass index is 30.08 kg/m.  General Appearance: Casual  Eye Contact:  Good  Speech:  Clear and Coherent and Normal Rate  Volume:  Normal  Mood:  Anxious and Depressed  Affect:  Congruent  Thought Process:  Goal Directed and Descriptions of Associations: Intact  Orientation:  Full (Time, Place, and Person)  Thought Content: Logical   Suicidal Thoughts:  No  Homicidal Thoughts:  No  Memory:  Immediate;   Good Recent;   Good Remote;   Good  Judgement:  Good  Insight:  Good  Psychomotor Activity:  Normal   Concentration:  Concentration: Good and Attention Span: Good  Recall:  Good  Fund of Knowledge: Good  Language: Good  Akathisia:  No  Handed:  Right  AIMS (if indicated): not done  Assets:  Communication Skills Desire for Improvement Housing Resilience Social Support Transportation Vocational/Educational  ADL's:  Intact  Cognition: WNL  Sleep:  Fair   Screenings: AUDIT     Ad[facility]  Alcohol Use Disorder Identification Test Final Score (AUDIT)  0       Assessment and Plan: MDD-recurrent, severe without psychotic features; GAD; insomnia    Medication management with supportive therapy. Risks and benefits, side effects and alternative treatment options discussed with patient. Pt was given an opportunity to ask questions about medication, illness, and treatment. All current psychiatric medications have been reviewed and discussed with the patient and adjusted as clinically appropriate. The patient has been provided an accurate and updated list of the medications being now prescribed. Patient expressed understanding of how their medications were to be used.  Pt verbalized understanding and verbal consent obtained for treatment.   The risk of un-intended pregnancy is low based on the fact that pt reports she had tubal ligation. Pt is aware that these meds carry a teratogenic risk. Pt will discuss plan of action if she does or plans to become pregnant in the future.   Meds: Effexor XR 225 mg p.o. daily for MDD and GAD Vistaril 10 mg p.o. 3 times daily as needed anxiety related to GAD Cytomel 5 mcg p.o. daily for augmentation and fatigue treatment related to MDD Pt does not want meds changed today If pt's symptoms do not improve may refer for TMS maintaince treatment    Labs: ordered TSH   Therapy: brief supportive therapy provided. Discussed psychosocial stressors in detail.       Consultations: none    Pt denies plan or intent and is at an acute low risk for suicide. Patient told to call clinic if any problems occur.  Patient advised to go to ER if they should develop SI/HI, side effects, or if symptoms worsen. Has crisis numbers to call if needed. Pt verbalized understanding.   F/up in 8 weeks or sooner if needed    [name], MD 05/14/2017, 10:43 AM

## 2017-07-23 ENCOUNTER — Ambulatory Visit (HOSPITAL_COMMUNITY): Payer: Self-pay | Admitting: Psychiatry

## 2017-07-23 NOTE — Progress Notes (Deleted)
BH MD/PA/NP OP Progress Note  07/23/2017 11:14 AM Nancy Barnes  MRN:  [id number]  Chief Complaint:   HPI:   Visit Diagnosis:  No diagnosis found.    Past Psychiatric History:  Anxiety: Yes Bipolar Disorder: No Depression: Yes Mania: No Psychosis: No Schizophrenia: No Personality Disorder: No Hospitalization for psychiatric illness: Yes History of Electroconvulsive Shock Therapy: No Prior Suicide Attempts: Yes Previous meds: Wellbutrin   Past Medical History:  Past Medical History:  Diagnosis Date  . Anxiety   . Asthma   . Depression   . Environmental allergies    food/medication  . Headache(784.0)   . Hypertension   . Hypokalemia   . Pneumomediastinum (HCC)   . Seasonal allergies   . Suicidal ideations     Past Surgical History:  Procedure Laterality Date  . ABLATION    . BREAST BIOPSY  04/2011  . TUBAL LIGATION      Family Psychiatric History:  Family History  Problem Relation Age of Onset  . Heart attack Mother        Bypass x5  . Allergies Mother   . Diabetes Mellitus II Father   . Hypertension Father   . Allergies Daughter   . Allergies Maternal Grandmother   . Atrial fibrillation Maternal Grandmother   . Stroke Maternal Grandmother   . Suicidality Neg Hx   . Depression Neg Hx   . Anxiety disorder Neg Hx     Social History:  Social History   Socioeconomic History  . Marital status: Divorced    Spouse name: Not on file  . Number of children: Not on file  . Years of education: Not on file  . Highest education level: Not on file  Occupational History  . 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 Occupation: Engineer, maintenance (IT)  . Financial resource strain: Not on file  . Food insecurity:    Worry: Not on file    Inability: Not on file  . Transportation needs:    Medical: Not on file    Non-medical: Not on file  Tobacco Use  . Smoking status:  Never Smoker  . Smokeless tobacco: Never Used  Substance and Sexual Activity  . Alcohol use: Yes    Comment: socially  . Drug use: No  . Sexual activity: Not Currently    Partners: Male    Birth control/protection: None  Lifestyle  . Physical activity:    Days per week: Not on file    Minutes per session: Not on file  . Stress: Not on file  Relationships  . Social connections:    Talks on phone: Not on file    Gets together: Not on file    Attends religious service: Not on file    Active member of club or organization: Not on file    Attends meetings of clubs or organizations: Not on file    Relationship status: Not on file  Other Topics Concern  . Not on file  Social History Narrative   Works for [employer]. Lives at home with 2 teenage children. Never smoker.    Allergies:  Allergies  Allergen Reactions  . Nsaids Shortness Of Breath  . Shellfish Allergy Anaphylaxis  . Abilify [Aripiprazole] Other (See Comments)    hallucinations  . Amoxicillin   . Fish Allergy Other (See Comments)    Reaction unknown  . Kasandra Knudsen [Lurasidone Hcl] Other (See  Comments)    Uncontrollable shaking, hopelessness.  Barbera Setters. Levaquin [Levofloxacin In D5w] Other (See Comments)    Reaction unknown  . Other Other (See Comments)    Reaction unknown to nuts  . Penicillins Other (See Comments)    Reaction unknown Has patient had a PCN reaction causing immediate rash, facial/tongue/throat swelling, SOB or lightheadedness with hypotension: Yes Has patient had a PCN reaction causing severe rash involving mucus membranes or skin necrosis: No Has patient had a PCN reaction that required hospitalization: No Has patient had a PCN reaction occurring within the last 10 years: No If all of the above answers are "NO", then may proceed with Cephalosporin use.  . Prednisone Other (See Comments)    Pruritus/ hives  . Seroquel [Quetiapine Fumarate] Other (See Comments)    Patient states it made her  not care about the world and she just sat down and didn't get up.  . Sulfa Antibiotics Other (See Comments)    Reaction unknown  . Ativan [Lorazepam] Anxiety    Shakiness  . Xanax [Alprazolam] Anxiety    Makes pt. anxious    Metabolic Disorder Labs: No results found for: HGBA1C, MPG No results found for: PROLACTIN No results found for: CHOL, TRIG, HDL, CHOLHDL, VLDL, LDLCALC No results found for: TSH  Therapeutic Level Labs: No results found for: LITHIUM No results found for: VALPROATE No components found for:  CBMZ  Current Medications: Current Outpatient Medications  Medication Sig Dispense Refill  . albuterol (PROVENTIL HFA;VENTOLIN HFA) 108 (90 BASE) MCG/ACT inhaler Inhale 2 puffs into the lungs every 6 (six) hours as needed for wheezing or shortness of breath. 3 Inhaler 0  . cholecalciferol (VITAMIN D) 1000 units tablet Take 1,000 Units by mouth daily.    . Fluticasone-Salmeterol (ADVAIR) 250-50 MCG/DOSE AEPB Inhale 1 puff into the lungs 2 (two) times daily.    . hydrOXYzine (ATARAX/VISTARIL) 10 MG tablet Take 1 tablet (10 mg total) by mouth 3 (three) times daily as needed for anxiety. 90 tablet 2  . ipratropium-albuterol (DUONEB) 0.5-2.5 (3) MG/3ML SOLN Take 3 mLs by nebulization every 6 (six) hours as needed (shortness of breath/wheezing.).    Marland Kitchen. liothyronine (CYTOMEL) 5 MCG tablet Take 1 tablet (5 mcg total) by mouth daily. 30 tablet 2  . Magnesium 250 MG TABS Take 250 mg by mouth daily.    Marland Kitchen. omeprazole (PRILOSEC) 20 MG capsule Take 1 capsule (20 mg total) by mouth daily.    . Probiotic Product (PROBIOTIC-10 PO) Take 1 tablet by mouth daily.     Marland Kitchen. venlafaxine XR (EFFEXOR-XR) 75 MG 24 hr capsule Take 3 capsules (225 mg total) by mouth daily with breakfast. 90 capsule 2   No current facility-administered medications for this visit.      Musculoskeletal: Strength & Muscle Tone: within normal limits Gait & Station: normal Patient leans: N/A  Mental Status  Examination/Evaluation: Objective: Attitude: Calm and cooperative  Appearance: {Appearance:22683}, appears to be stated age  Eye Contact::  {[facility] EYE CONTACT:22684}  Speech:  {Speech:22685}  Volume:  {Volume (PAA):22686}  Mood:  ***  Affect:  {Affect (PAA):22687}  Thought Process:  {Thought Process (PAA):22688}  Orientation:  {[facility] ORIENTATION (PAA):22689}  Thought Content:  {Thought Content:22690}  Suicidal Thoughts:  {ST/HT (PAA):22692}  Homicidal Thoughts:  {ST/HT (PAA):22692}  Judgement:  {Judgement (PAA):22694}  Insight:  {Insight (PAA):22695}  Concentration: good  Memory: Immediate-*** Recent-*** Remote-***  Recall: fair  Language: fair  Gait and Station: normal  [employer] of Knowledge: average  Psychomotor Activity:  {  Psychomotor (PAA):22696}  Akathisia:  {[facility] YES OR NO:22294}  Handed:  {Handed:22697}  AIMS (if indicated):  Facial and Oral Movements  Muscles of Facial Expression: None, normal  Lips and Perioral Area: None, normal  Jaw: None, normal  Tongue: None, normal Extremity Movements: Upper (arms, wrists, hands, fingers): None, normal  Lower (legs, knees, ankles, toes): None, normal,  Trunk Movements:  Neck, shoulders, hips: None, normal,  Overall Severity : Severity of abnormal movements (highest score from questions above): None, normal  Incapacitation due to abnormal movements: None, normal  Patient's awareness of abnormal movements (rate only patient's report): No Awareness, Dental Status  Current problems with teeth and/or dentures?: No  Does patient usually wear dentures?: No    Assets:  {Assets (PAA):22698}       Screenings: AUDIT     [facility]  Alcohol Use Disorder Identification Test Final Score (AUDIT)  0      I reviewed the information below on 07/23/2017 and agree except where noted/changed Assessment and Plan: MDD-recurrent, severe without psychotic features;  GAD; insomnia    Medication management with supportive therapy. Risks and benefits, side effects and alternative treatment options discussed with patient. Pt was given an opportunity to ask questions about medication, illness, and treatment. All current psychiatric medications have been reviewed and discussed with the patient and adjusted as clinically appropriate. The patient has been provided an accurate and updated list of the medications being now prescribed. Patient expressed understanding of how their medications were to be used.  Pt verbalized understanding and verbal consent obtained for treatment.   The risk of un-intended pregnancy is low based on the fact that pt reports she had tubal ligation. Pt is aware that these meds carry a teratogenic risk. Pt will discuss plan of action if she does or plans to become pregnant in the future.   Meds: Effexor XR 225 mg p.o. daily for MDD and GAD Vistaril 10 mg p.o. 3 times daily as needed anxiety related to GAD Cytomel 5 mcg p.o. daily for augmentation and fatigue treatment related to MDD Pt does not want meds changed today If pt's symptoms do not improve may refer for TMS maintaince treatment    Labs: ordered TSH   Therapy: brief supportive therapy provided. Discussed psychosocial stressors in detail.       Consultations: none   Pt denies plan or intent and is at an acute low risk for suicide. Patient told to call clinic if any problems occur. Patient advised to go to ER if they should develop SI/HI, side effects, or if symptoms worsen. Has crisis numbers to call if needed. Pt verbalized understanding.   F/up in 8 weeks or sooner if needed    [name], MD 07/23/2017, 11:14 AM

## 2017-08-13 ENCOUNTER — Ambulatory Visit (INDEPENDENT_AMBULATORY_CARE_PROVIDER_SITE_OTHER): Payer: Self-pay | Admitting: Psychiatry

## 2017-08-13 ENCOUNTER — Encounter (HOSPITAL_COMMUNITY): Payer: Self-pay | Admitting: Psychiatry

## 2017-08-13 VITALS — BP 132/70 | HR 80 | Ht 64.5 in | Wt 181.0 lb

## 2017-08-13 DIAGNOSIS — F99 Mental disorder, not otherwise specified: Secondary | ICD-10-CM

## 2017-08-13 DIAGNOSIS — F411 Generalized anxiety disorder: Secondary | ICD-10-CM

## 2017-08-13 DIAGNOSIS — I10 Essential (primary) hypertension: Secondary | ICD-10-CM

## 2017-08-13 DIAGNOSIS — F332 Major depressive disorder, recurrent severe without psychotic features: Secondary | ICD-10-CM

## 2017-08-13 DIAGNOSIS — F5105 Insomnia due to other mental disorder: Secondary | ICD-10-CM

## 2017-08-13 DIAGNOSIS — R450 Nervousness: Secondary | ICD-10-CM

## 2017-08-13 MED ORDER — LIOTHYRONINE SODIUM 5 MCG PO TABS: 10.0000 ug | ORAL_TABLET | Freq: Every day | ORAL | 2 refills | Status: DC

## 2017-08-13 MED ORDER — HYDROXYZINE HCL 10 MG PO TABS: 10.0000 mg | ORAL_TABLET | Freq: Three times a day (TID) | ORAL | 2 refills | Status: DC | PRN

## 2017-08-13 MED ORDER — VENLAFAXINE HCL ER 75 MG PO CP24: 225.0000 mg | ORAL_CAPSULE | Freq: Every day | ORAL | 2 refills | Status: DC

## 2017-08-13 MED ORDER — LISINOPRIL-HYDROCHLOROTHIAZIDE 20-12.5 MG PO TABS: 1.0000 | ORAL_TABLET | Freq: Every day | ORAL | 1 refills | Status: DC

## 2017-08-13 NOTE — Progress Notes (Signed)
BH MD/PA/NP OP Progress Note  08/13/2017 10:24 AM Nancy ChurchKelly Barnes  MRN:  [id number]  Chief Complaint:  Chief Complaint    Depression     HPI: pt states she is very depressed. She has not been working. She is hoping that a job at [employer] will come thru. Pt reports motivation is ok but she is always tired. She is feeling lonely and is crying alot. Sleep is good. Pt denies SI/HI.  Visit Diagnosis:    ICD-10-CM   1. Severe episode of recurrent major depressive disorder, without psychotic features (HCC) F33.2 liothyronine (CYTOMEL) 5 MCG tablet    venlafaxine XR (EFFEXOR-XR) 75 MG 24 hr capsule  2. GAD (generalized anxiety disorder) F41.1 hydrOXYzine (ATARAX/VISTARIL) 10 MG tablet    venlafaxine XR (EFFEXOR-XR) 75 MG 24 hr capsule  3. Insomnia due to other mental disorder F51.05 hydrOXYzine (ATARAX/VISTARIL) 10 MG tablet   F99   4. Hypertension, unspecified type I10 lisinopril-hydrochlorothiazide (PRINZIDE,ZESTORETIC) 20-12.5 MG tablet      Past Psychiatric History:  Anxiety: Yes Bipolar Disorder: No Depression: Yes Mania: No Psychosis: No Schizophrenia: No Personality Disorder: No Hospitalization for psychiatric illness: Yes History of Electroconvulsive Shock Therapy: No Prior Suicide Attempts: Yes Previous meds: Wellbutrin   Past Medical History:  Past Medical History:  Diagnosis Date  . Anxiety   . Asthma   . Depression   . Environmental allergies    food/medication  . Headache(784.0)   . Hypertension   . Hypokalemia   . Pneumomediastinum (HCC)   . Seasonal allergies   . Suicidal ideations     Past Surgical History:  Procedure Laterality Date  . ABLATION    . BREAST BIOPSY  04/2011  . TUBAL LIGATION      Family Psychiatric History:  Family History  Problem Relation Age of Onset  . Heart attack Mother        Bypass x5  . Allergies Mother   . Diabetes Mellitus II Father   . Hypertension Father   . Allergies Daughter   . Allergies Maternal Grandmother   .  Atrial fibrillation Maternal Grandmother   . Stroke Maternal Grandmother   . Suicidality Neg Hx   . Depression Neg Hx   . Anxiety disorder Neg Hx     Social History:  Social History   Socioeconomic History  . Marital status: Divorced    Spouse name: Not on file  . Number of children: Not on file  . Years of education: Not on file  . Highest education level: Not on file  Occupational History  . 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 Occupation: Engineer, maintenance (IT)Volunteer Firefighter  Social Needs  . Financial resource strain: Not on file  . Food insecurity:    Worry: Not on file    Inability: Not on file  . Transportation needs:    Medical: Not on file    Non-medical: Not on file  Tobacco Use  . Smoking status: Never Smoker  . Smokeless tobacco: Never Used  Substance and Sexual Activity  . Alcohol use: Yes    Comment: socially  . Drug use: No  . Sexual activity: Not Currently    Partners: Male    Birth control/protection: None  Lifestyle  . Physical activity:    Days per week: Not on file    Minutes per session: Not on file  . Stress: Not on file  Relationships  . Social connections:  Talks on phone: Not on file    Gets together: Not on file    Attends religious service: Not on file    Active member of club or organization: Not on file    Attends meetings of clubs or organizations: Not on file    Relationship status: Not on file  Other Topics Concern  . Not on file  Social History Narrative   Works for [employer]. Lives at home with 2 teenage children. Never smoker.    Allergies:  Allergies  Allergen Reactions  . Nsaids Shortness Of Breath  . Shellfish Allergy Anaphylaxis  . Abilify [Aripiprazole] Other (See Comments)    hallucinations  . Amoxicillin   . Fish Allergy Other (See Comments)    Reaction unknown  . Latuda [Lurasidone Hcl] Other (See Comments)    Uncontrollable  shaking, hopelessness.  Barbera Setters [Levofloxacin In D5w] Other (See Comments)    Reaction unknown  . Other Other (See Comments)    Reaction unknown to nuts  . Penicillins Other (See Comments)    Reaction unknown Has patient had a PCN reaction causing immediate rash, facial/tongue/throat swelling, SOB or lightheadedness with hypotension: Yes Has patient had a PCN reaction causing severe rash involving mucus membranes or skin necrosis: No Has patient had a PCN reaction that required hospitalization: No Has patient had a PCN reaction occurring within the last 10 years: No If all of the above answers are "NO", then may proceed with Cephalosporin use.  . Prednisone Other (See Comments)    Pruritus/ hives  . Seroquel [Quetiapine Fumarate] Other (See Comments)    Patient states it made her not care about the world and she just sat down and didn't get up.  . Sulfa Antibiotics Other (See Comments)    Reaction unknown  . Ativan [Lorazepam] Anxiety    Shakiness  . Xanax [Alprazolam] Anxiety    Makes pt. anxious    Metabolic Disorder Labs: No results found for: HGBA1C, MPG No results found for: PROLACTIN No results found for: CHOL, TRIG, HDL, CHOLHDL, VLDL, LDLCALC No results found for: TSH  Therapeutic Level Labs: No results found for: LITHIUM No results found for: VALPROATE No components found for:  CBMZ  Current Medications: Current Outpatient Medications  Medication Sig Dispense Refill  . albuterol (PROVENTIL HFA;VENTOLIN HFA) 108 (90 BASE) MCG/ACT inhaler Inhale 2 puffs into the lungs every 6 (six) hours as needed for wheezing or shortness of breath. 3 Inhaler 0  . cholecalciferol (VITAMIN D) 1000 units tablet Take 1,000 Units by mouth daily.    . Fluticasone-Salmeterol (ADVAIR) 250-50 MCG/DOSE AEPB Inhale 1 puff into the lungs 2 (two) times daily.    . hydrOXYzine (ATARAX/VISTARIL) 10 MG tablet Take 1 tablet (10 mg total) by mouth 3 (three) times daily as needed for anxiety. 90  tablet 2  . ipratropium-albuterol (DUONEB) 0.5-2.5 (3) MG/3ML SOLN Take 3 mLs by nebulization every 6 (six) hours as needed (shortness of breath/wheezing.).    Marland Kitchen liothyronine (CYTOMEL) 5 MCG tablet Take 2 tablets (10 mcg total) by mouth daily. 60 tablet 2  . lisinopril-hydrochlorothiazide (PRINZIDE,ZESTORETIC) 20-12.5 MG tablet Take 1 tablet by mouth daily. 30 tablet 1  . Magnesium 250 MG TABS Take 250 mg by mouth daily.    Marland Kitchen omeprazole (PRILOSEC) 20 MG capsule Take 1 capsule (20 mg total) by mouth daily.    . Probiotic Product (PROBIOTIC-10 PO) Take 1 tablet by mouth daily.     Marland Kitchen venlafaxine XR (EFFEXOR-XR) 75 MG 24 hr capsule  Take 3 capsules (225 mg total) by mouth daily with breakfast. 90 capsule 2   No current facility-administered medications for this visit.      Musculoskeletal: Strength & Muscle Tone: within normal limits Gait & Station: normal Patient leans: N/A  Psychiatric Specialty Exam: Physical Exam  Review of Systems  Constitutional: Positive for weight loss. Negative for chills and fever.  Psychiatric/Behavioral: Positive for depression. Negative for hallucinations and suicidal ideas. The patient is nervous/anxious. The patient does not have insomnia.     Blood pressure 132/70, pulse 80, height 5' 4.5" (1.638 m), weight 181 lb (82.1 kg).Body mass index is 30.59 kg/m.  General Appearance: Casual  Eye Contact:  Good  Speech:  Clear and Coherent and Normal Rate  Volume:  Normal  Mood:  Anxious and Depressed  Affect:  Congruent  Thought Process:  Goal Directed and Descriptions of Associations: Intact  Orientation:  Full (Time, Place, and Person)  Thought Content:  Logical  Suicidal Thoughts:  No  Homicidal Thoughts:  No  Memory:  Immediate;   Good Recent;   Good Remote;   Good  Judgement:  Good  Insight:  Good  Psychomotor Activity:  Normal  Concentration:  Concentration: Good and Attention Span: Good  Recall:  Good  Fund of Knowledge:  Good  Language:  Good   Akathisia:  No  Handed:  Right  AIMS (if indicated):     Assets:  Communication Skills Desire for Improvement Talents/Skills Transportation  ADL's:  Intact  Cognition:  WNL  Sleep:   good     Screenings: AUDIT     [facility]  Alcohol Use Disorder Identification Test Final Score (AUDIT)  0        I reviewed the information below on 08/13/2017 and agree Assessment and Plan: MDD-recurrent, severe without psychotic features; GAD; insomnia    Medication management with supportive therapy. Risks and benefits, side effects and alternative treatment options discussed with patient. Pt was given an opportunity to ask questions about medication, illness, and treatment. All current psychiatric medications have been reviewed and discussed with the patient and adjusted as clinically appropriate. The patient has been provided an accurate and updated list of the medications being now prescribed. Patient expressed understanding of how their medications were to be used.  Pt verbalized understanding and verbal consent obtained for treatment.   The risk of un-intended pregnancy is low based on the fact that pt reports she had tubal ligation. Pt is aware that these meds carry a teratogenic risk. Pt will discuss plan of action if she does or plans to become pregnant in the future.   Meds: Effexor XR 225 mg p.o. daily for MDD and GAD Vistaril 10 mg p.o. 3 times daily as needed anxiety related to GAD Increase Cytomel 10 mcg p.o. daily for augmentation and fatigue treatment related to MDD If pt's symptoms do not improve may refer for TMS maintaince treatment    Labs: ordered TSH   Therapy: brief supportive therapy provided. Discussed psychosocial stressors in detail.       Consultations: none   Pt denies plan or intent and is at an acute low risk for suicide. Patient told to call clinic if any problems occur. Patient advised  to go to ER if they should develop SI/HI, side effects, or if symptoms worsen. Has crisis numbers to call if needed. Pt verbalized understanding.   F/up in 8 weeks or sooner if needed  Letter written for pet  August 13, 2017  Patient:  Nancy Barnes Date of Birth:  [birth year] Date of Visit:  08/13/2017  To Whom it May Concern:   Nancy Barnes is my patient, and has been under my care since 2016. I am intimately aware of her medical history and functional restrictions brought by her mental condition. As a result of mental illness, Nancy Barnes has certain limitations related to anxiety and depression. In order to assist in alleviating these difficulties, and to improve her ability to lead a better life while fully enjoying and using the dwelling unit you own and/or manage, I am writing this letter that will help Nancy Barnes in dealing with her disability better.  I am very much aware of the voluminous professional literature related to the therapeutic benefits of emotional support animals for individuals with mental disabilities, such as that faced by Nancy Barnes. Should you have any further questions, please do not hesitate to get in touch with Nancy Barnes  Sincerely,    Sincerely,  [name], MD  08/13/2017, 9:56 AM    [name], MD 08/13/2017, 10:24 AM

## 2017-10-15 ENCOUNTER — Ambulatory Visit (INDEPENDENT_AMBULATORY_CARE_PROVIDER_SITE_OTHER): Payer: Self-pay | Admitting: Psychiatry

## 2017-10-15 ENCOUNTER — Encounter (HOSPITAL_COMMUNITY): Payer: Self-pay | Admitting: Psychiatry

## 2017-10-15 ENCOUNTER — Other Ambulatory Visit: Payer: Self-pay

## 2017-10-15 DIAGNOSIS — F411 Generalized anxiety disorder: Secondary | ICD-10-CM

## 2017-10-15 DIAGNOSIS — F332 Major depressive disorder, recurrent severe without psychotic features: Secondary | ICD-10-CM

## 2017-10-15 DIAGNOSIS — F99 Mental disorder, not otherwise specified: Secondary | ICD-10-CM

## 2017-10-15 DIAGNOSIS — F5105 Insomnia due to other mental disorder: Secondary | ICD-10-CM

## 2017-10-15 MED ORDER — VENLAFAXINE HCL ER 75 MG PO CP24: 225.0000 mg | ORAL_CAPSULE | Freq: Every day | ORAL | 2 refills | Status: DC

## 2017-10-15 MED ORDER — HYDROXYZINE HCL 10 MG PO TABS: 10.0000 mg | ORAL_TABLET | Freq: Three times a day (TID) | ORAL | 2 refills | Status: DC | PRN

## 2017-10-15 NOTE — Progress Notes (Signed)
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 She has been there 3 weeks and likes it but has only been shadowing so far.  Pt states she is to busy to feel depressed and is happy about it. Pt is trying to get used to her "new normal". She never had money to fill the Cytomel. She is feeling better so does not want to restart it. Sleep is good. Her energy is generally low. She is eating healthier. Pt denies SI/HI. Pt has been taking Vistaril and Effexor. Pt is looking for a new apartment now that she has steady pay.   Visit Diagnosis:    ICD-10-CM   1. GAD (generalized anxiety disorder) F41.1 hydrOXYzine (ATARAX/VISTARIL) 10 MG tablet    venlafaxine XR (EFFEXOR-XR) 75 MG 24 hr capsule  2. Insomnia due to other mental disorder F51.05 hydrOXYzine (ATARAX/VISTARIL) 10 MG tablet   F99   3. Severe episode of recurrent major depressive disorder, without psychotic features (HCC) F33.2 venlafaxine XR (EFFEXOR-XR) 75 MG 24 hr capsule      Past Psychiatric History:  Anxiety: Yes Bipolar Disorder: No Depression: Yes Mania: No Psychosis: No Schizophrenia: No Personality Disorder: No Hospitalization for psychiatric illness: Yes History of Electroconvulsive Shock Therapy: No Prior Suicide Attempts: Yes Previous meds: Wellbutrin   Past Medical History:  Past Medical History:  Diagnosis Date  . Anxiety   . Asthma   . Depression   . Environmental allergies    food/medication  . Headache(784.0)   . Hypertension   . Hypokalemia   . Pneumomediastinum (HCC)   . Seasonal allergies   . Suicidal ideations     Past Surgical History:  Procedure Laterality Date  . ABLATION    . BREAST BIOPSY  04/2011  . TUBAL LIGATION      Family Psychiatric History:  Family History  Problem Relation Age of Onset  . Heart attack  Mother        Bypass x5  . Allergies Mother   . Diabetes Mellitus II Father   . Hypertension Father   . Allergies Daughter   . Allergies Maternal Grandmother   . Atrial fibrillation Maternal Grandmother   . Stroke Maternal Grandmother   . Suicidality Neg Hx   . Depression Neg Hx   . Anxiety disorder Neg Hx     Social History:  Social History   Socioeconomic History  . Marital status: Divorced    Spouse name: Not on file  . Number of children: Not on file  . Years of education: Not on file  . Highest education level: Not on file  Occupational History  . 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 Occupation: Engineer, maintenance (IT)  . Financial resource strain: Not on file  . Food insecurity:    Worry: Not on file    Inability: Not on file  . Transportation needs:    Medical: Not on file    Non-medical: Not on file  Tobacco Use  . Smoking status: Never Smoker  . Smokeless tobacco: Never Used  Substance and Sexual Activity  . Alcohol use: Yes    Comment: socially  . Drug use: No  . Sexual activity: Not Currently  Partners: Male    Birth control/protection: None  Lifestyle  . Physical activity:    Days per week: Not on file    Minutes per session: Not on file  . Stress: Not on file  Relationships  . Social connections:    Talks on phone: Not on file    Gets together: Not on file    Attends religious service: Not on file    Active member of club or organization: Not on file    Attends meetings of clubs or organizations: Not on file    Relationship status: Not on file  Other Topics Concern  . Not on file  Social History Narrative   Works for [employer]. Lives at home with 2 teenage children. Never smoker.    Allergies:  Allergies  Allergen Reactions  . Nsaids Shortness Of Breath  . Shellfish Allergy Anaphylaxis  . Abilify [Aripiprazole] Other (See  Comments)    hallucinations  . Amoxicillin   . Fish Allergy Other (See Comments)    Reaction unknown  . Latuda [Lurasidone Hcl] Other (See Comments)    Uncontrollable shaking, hopelessness.  Barbera Setters [Levofloxacin In D5w] Other (See Comments)    Reaction unknown  . Other Other (See Comments)    Reaction unknown to nuts  . Penicillins Other (See Comments)    Reaction unknown Has patient had a PCN reaction causing immediate rash, facial/tongue/throat swelling, SOB or lightheadedness with hypotension: Yes Has patient had a PCN reaction causing severe rash involving mucus membranes or skin necrosis: No Has patient had a PCN reaction that required hospitalization: No Has patient had a PCN reaction occurring within the last 10 years: No If all of the above answers are "NO", then may proceed with Cephalosporin use.  . Prednisone Other (See Comments)    Pruritus/ hives  . Seroquel [Quetiapine Fumarate] Other (See Comments)    Patient states it made her not care about the world and she just sat down and didn't get up.  . Sulfa Antibiotics Other (See Comments)    Reaction unknown  . Ativan [Lorazepam] Anxiety    Shakiness  . Xanax [Alprazolam] Anxiety    Makes pt. anxious    Metabolic Disorder Labs: No results found for: HGBA1C, MPG No results found for: PROLACTIN No results found for: CHOL, TRIG, HDL, CHOLHDL, VLDL, LDLCALC No results found for: TSH  Therapeutic Level Labs: No results found for: LITHIUM No results found for: VALPROATE No components found for:  CBMZ  Current Medications: Current Outpatient Medications  Medication Sig Dispense Refill  . albuterol (PROVENTIL HFA;VENTOLIN HFA) 108 (90 BASE) MCG/ACT inhaler Inhale 2 puffs into the lungs every 6 (six) hours as needed for wheezing or shortness of breath. 3 Inhaler 0  . cholecalciferol (VITAMIN D) 1000 units tablet Take 1,000 Units by mouth daily.    . Fluticasone-Salmeterol (ADVAIR) 250-50 MCG/DOSE AEPB Inhale 1 puff  into the lungs 2 (two) times daily.    . hydrOXYzine (ATARAX/VISTARIL) 10 MG tablet Take 1 tablet (10 mg total) by mouth 3 (three) times daily as needed for anxiety. 90 tablet 2  . ipratropium-albuterol (DUONEB) 0.5-2.5 (3) MG/3ML SOLN Take 3 mLs by nebulization every 6 (six) hours as needed (shortness of breath/wheezing.).    Marland Kitchen losartan-hydrochlorothiazide (HYZAAR) 100-12.5 MG tablet Take 1 tablet by mouth daily.  1  . Magnesium 250 MG TABS Take 250 mg by mouth daily.    Marland Kitchen omeprazole (PRILOSEC) 20 MG capsule Take 1 capsule (20 mg total) by mouth daily.    Marland Kitchen  Probiotic Product (PROBIOTIC-10 PO) Take 1 tablet by mouth daily.     Marland Kitchen venlafaxine XR (EFFEXOR-XR) 75 MG 24 hr capsule Take 3 capsules (225 mg total) by mouth daily with breakfast. 90 capsule 2  . lisinopril-hydrochlorothiazide (PRINZIDE,ZESTORETIC) 20-12.5 MG tablet Take 1 tablet by mouth daily. (Patient not taking: Reported on 10/15/2017) 30 tablet 1   No current facility-administered medications for this visit.      Musculoskeletal: Strength & Muscle Tone: within normal limits Gait & Station: normal Patient leans: N/A   Psychiatric Specialty Exam: Review of Systems  Constitutional: Negative for chills, diaphoresis and fever.  Musculoskeletal: Negative for back pain, joint pain, myalgias and neck pain.    Blood pressure (!) 137/100, pulse 98, resp. rate 18, weight 184 lb 9.6 oz (83.7 kg), SpO2 98 %.Body mass index is 31.2 kg/m.  General Appearance: Fairly Groomed  Eye Contact:  Good  Speech:  Clear and Coherent and Normal Rate  Volume:  Normal  Mood:  Euthymic  Affect:  Full Range  Thought Process:  Goal Directed and Descriptions of Associations: Intact  Orientation:  Full (Time, Place, and Person)  Thought Content:  Logical  Suicidal Thoughts:  No  Homicidal Thoughts:  No  Memory:  Immediate;   Good  Judgement:  Good  Insight:  Good  Psychomotor Activity:  Normal  Concentration:  Concentration: Good  Recall:  Good   Fund of Knowledge:  Good  Language:  Good  Akathisia:  No  Handed:  Right  AIMS (if indicated):     Assets:  Communication Skills Desire for Improvement Housing Talents/Skills Transportation Vocational/Educational  ADL's:  Intact  Cognition:  WNL  Sleep:   good      Screenings: AUDIT     [facility]  Alcohol Use Disorder Identification Test Final Score (AUDIT)  0        I reviewed the information below on 10/15/2017 and have updated it Assessment and Plan: MDD-recurrent, severe without psychotic features; GAD; insomnia    Medication management with supportive therapy. Risks and benefits, side effects and alternative treatment options discussed with patient. Pt was given an opportunity to ask questions about medication, illness, and treatment. All current psychiatric medications have been reviewed and discussed with the patient and adjusted as clinically appropriate. The patient has been provided an accurate and updated list of the medications being now prescribed. Patient expressed understanding of how their medications were to be used.  Pt verbalized understanding and verbal consent obtained for treatment.   The risk of un-intended pregnancy is low based on the fact that pt reports she had tubal ligation. Pt is aware that these meds carry a teratogenic risk. Pt will discuss plan of action if she does or plans to become pregnant in the future.   Meds: Effexor XR 225 mg p.o. daily for MDD and GAD Vistaril 10 mg p.o. 3 times daily as needed anxiety related to GAD D/c  Cytomel    Labs: none   Therapy: brief supportive therapy provided. Discussed psychosocial stressors in detail.       Consultations: none   Pt denies plan or intent and is at an acute low risk for suicide. Patient told to call clinic if any problems occur. Patient advised to go to ER if they should develop SI/HI, side effects, or if  symptoms worsen. Has crisis numbers to call if needed. Pt verbalized understanding.   F/up in 8 weeks or sooner if  needed    [name], MD 10/15/2017, 2:37 PM

## 2018-01-21 ENCOUNTER — Ambulatory Visit (INDEPENDENT_AMBULATORY_CARE_PROVIDER_SITE_OTHER): Payer: Self-pay | Admitting: Psychiatry

## 2018-01-21 ENCOUNTER — Encounter (HOSPITAL_COMMUNITY): Payer: Self-pay | Admitting: Psychiatry

## 2018-01-21 DIAGNOSIS — F411 Generalized anxiety disorder: Secondary | ICD-10-CM

## 2018-01-21 DIAGNOSIS — F332 Major depressive disorder, recurrent severe without psychotic features: Secondary | ICD-10-CM

## 2018-01-21 MED ORDER — LIOTHYRONINE SODIUM 5 MCG PO TABS: 5.0000 ug | ORAL_TABLET | Freq: Every day | ORAL | 2 refills | Status: DC

## 2018-01-21 MED ORDER — VENLAFAXINE HCL ER 75 MG PO CP24: 225.0000 mg | ORAL_CAPSULE | Freq: Every day | ORAL | 2 refills | Status: DC

## 2018-01-21 NOTE — Progress Notes (Signed)
BH MD/PA/NP OP Progress Note  01/21/2018 10:34 AM Doristine ChurchKelly Meline  MRN:  [id number]  Chief Complaint:  Chief Complaint    Depression; Anxiety; Follow-up     HPI: Patient reports that she is overall doing well.  She likes her job but does not like certain aspects of it.  It is very busy and tiring at times.  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  Her sleep remains poor.  Her energy is on the low side again.  Today she restarted the Cytomel.  She is making efforts to eat healthier and work on her sleep schedule.  She states that her depression is "okay".  She is denying any issues with isolation or suicidal and homicidal ideations.  She has some anxiety around her work but it is not prominent enough that she needs to take Vistaril.  She has not taken it in several months.  Visit Diagnosis:    ICD-10-CM   1. Severe episode of recurrent major depressive disorder, without psychotic features (HCC) F33.2 venlafaxine XR (EFFEXOR-XR) 75 MG 24 hr capsule    liothyronine (CYTOMEL) 5 MCG tablet  2. GAD (generalized anxiety disorder) F41.1 venlafaxine XR (EFFEXOR-XR) 75 MG 24 hr capsule      Past Psychiatric History:  Anxiety: Yes Bipolar Disorder: No Depression: Yes Mania: No Psychosis: No Schizophrenia: No Personality Disorder: No Hospitalization for psychiatric illness: Yes History of Electroconvulsive Shock Therapy: No Prior Suicide Attempts: Yes Previous meds: Wellbutrin   Past Medical History:  Past Medical History:  Diagnosis Date  . Anxiety   . Asthma   . Depression   . Environmental allergies    food/medication  . Headache(784.0)   . Hypertension   . Hypokalemia   . Pneumomediastinum (HCC)   . Seasonal allergies   . Suicidal ideations     Past Surgical History:  Procedure Laterality Date  . ABLATION    . BREAST BIOPSY  04/2011  . TUBAL LIGATION      Family Psychiatric History:  Family History  Problem  Relation Age of Onset  . Heart attack Mother        Bypass x5  . Allergies Mother   . Diabetes Mellitus II Father   . Hypertension Father   . Allergies Daughter   . Allergies Maternal Grandmother   . Atrial fibrillation Maternal Grandmother   . Stroke Maternal Grandmother   . Suicidality Neg Hx   . Depression Neg Hx   . Anxiety disorder Neg Hx     Social History:  Social History   Socioeconomic History  . Marital status: Divorced    Spouse name: Not on file  . Number of children: Not on file  . Years of education: Not on file  . Highest education level: Not on file  Occupational History  . 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 Occupation: Engineer, maintenance (IT)Volunteer Firefighter  Social Needs  . Financial resource strain: Not on file  . Food insecurity:    Worry: Not on file    Inability: Not on file  . Transportation needs:    Medical: Not on file    Non-medical: Not on file  Tobacco Use  . Smoking status: Never Smoker  . Smokeless tobacco: Never Used  Substance and Sexual Activity  . Alcohol use: Not Currently    Comment: socially  . Drug  use: No  . Sexual activity: Yes    Partners: Male    Birth control/protection: None, Condom  Lifestyle  . Physical activity:    Days per week: Not on file    Minutes per session: Not on file  . Stress: Not on file  Relationships  . Social connections:    Talks on phone: Not on file    Gets together: Not on file    Attends religious service: Not on file    Active member of club or organization: Not on file    Attends meetings of clubs or organizations: Not on file    Relationship status: Not on file  Other Topics Concern  . Not on file  Social History Narrative   Works for [employer]. Lives at home with 2 teenage children. Never smoker.    Allergies:  Allergies  Allergen Reactions  . Nsaids Shortness Of Breath  . Shellfish Allergy Anaphylaxis   . Abilify [Aripiprazole] Other (See Comments)    hallucinations  . Amoxicillin   . Fish Allergy Other (See Comments)    Reaction unknown  . Latuda [Lurasidone Hcl] Other (See Comments)    Uncontrollable shaking, hopelessness.  Barbera Setters [Levofloxacin In D5w] Other (See Comments)    Reaction unknown  . Other Other (See Comments)    Reaction unknown to nuts  . Penicillins Other (See Comments)    Reaction unknown Has patient had a PCN reaction causing immediate rash, facial/tongue/throat swelling, SOB or lightheadedness with hypotension: Yes Has patient had a PCN reaction causing severe rash involving mucus membranes or skin necrosis: No Has patient had a PCN reaction that required hospitalization: No Has patient had a PCN reaction occurring within the last 10 years: No If all of the above answers are "NO", then may proceed with Cephalosporin use.  . Prednisone Other (See Comments)    Pruritus/ hives  . Seroquel [Quetiapine Fumarate] Other (See Comments)    Patient states it made her not care about the world and she just sat down and didn't get up.  . Sulfa Antibiotics Other (See Comments)    Reaction unknown  . Ativan [Lorazepam] Anxiety    Shakiness  . Xanax [Alprazolam] Anxiety    Makes pt. anxious    Metabolic Disorder Labs: No results found for: HGBA1C, MPG No results found for: PROLACTIN No results found for: CHOL, TRIG, HDL, CHOLHDL, VLDL, LDLCALC No results found for: TSH  Therapeutic Level Labs: No results found for: LITHIUM No results found for: VALPROATE No components found for:  CBMZ  Current Medications: Current Outpatient Medications  Medication Sig Dispense Refill  . albuterol (PROVENTIL HFA;VENTOLIN HFA) 108 (90 BASE) MCG/ACT inhaler Inhale 2 puffs into the lungs every 6 (six) hours as needed for wheezing or shortness of breath. 3 Inhaler 0  . Fluticasone-Salmeterol (ADVAIR) 250-50 MCG/DOSE AEPB Inhale 1 puff into the lungs 2 (two) times daily.    Marland Kitchen  ipratropium-albuterol (DUONEB) 0.5-2.5 (3) MG/3ML SOLN Take 3 mLs by nebulization every 6 (six) hours as needed (shortness of breath/wheezing.).    Marland Kitchen losartan-hydrochlorothiazide (HYZAAR) 100-12.5 MG tablet Take 1 tablet by mouth daily.  1  . omeprazole (PRILOSEC) 20 MG capsule Take 1 capsule (20 mg total) by mouth daily.    . Probiotic Product (PROBIOTIC-10 PO) Take 1 tablet by mouth daily.     Marland Kitchen venlafaxine XR (EFFEXOR-XR) 75 MG 24 hr capsule Take 3 capsules (225 mg total) by mouth daily with breakfast. 90 capsule 2  . cholecalciferol (VITAMIN  D) 1000 units tablet Take 1,000 Units by mouth daily.    . hydrOXYzine (ATARAX/VISTARIL) 10 MG tablet Take 1 tablet (10 mg total) by mouth 3 (three) times daily as needed for anxiety. (Patient not taking: Reported on 01/21/2018) 90 tablet 2  . liothyronine (CYTOMEL) 5 MCG tablet Take 1 tablet (5 mcg total) by mouth daily. 30 tablet 2  . Magnesium 250 MG TABS Take 250 mg by mouth daily.     No current facility-administered medications for this visit.      Musculoskeletal: Strength & Muscle Tone: within normal limits Gait & Station: normal Patient leans: N/A  Psychiatric Specialty Exam: Review of Systems  Constitutional: Positive for malaise/fatigue. Negative for chills and fever.  Respiratory: Negative for cough, shortness of breath and wheezing.     Blood pressure 122/84, pulse 92, height 5' 5.5" (1.664 m), weight 186 lb (84.4 kg), SpO2 96 %.Body mass index is 30.48 kg/m.  General Appearance: Fairly Groomed  Eye Contact:  Good  Speech:  Clear and Coherent and Normal Rate  Volume:  Normal  Mood:  Euthymic  Affect:  Full Range  Thought Process:  Coherent, Goal Directed, Linear and Descriptions of Associations: Intact  Orientation:  Full (Time, Place, and Person)  Thought Content:  Logical  Suicidal Thoughts:  No  Homicidal Thoughts:  No  Memory:  Immediate;   Good  Judgement:  Good  Insight:  Good  Psychomotor Activity:  Normal   Concentration:  Concentration: Good  Recall:  Good  Fund of Knowledge:  Good  Language:  Good  Akathisia:  No  Handed:  Right  AIMS (if indicated):     Assets:  Communication Skills Desire for Improvement Financial Resources/Insurance Housing Intimacy Leisure Time Resilience Social Support Talents/Skills Transportation Vocational/Educational  ADL's:  Intact  Cognition:  WNL  Sleep:   poor         Screenings: AUDIT     [facility]  Alcohol Use Disorder Identification Test Final Score (AUDIT)  0        I Reviewed the information below on 01/21/2018 and have updated it Assessment and Plan: MDD-recurrent, severe without psychotic features; GAD; insomnia    Medication management with supportive therapy. Risks and benefits, side effects and alternative treatment options discussed with patient. Pt was given an opportunity to ask questions about medication, illness, and treatment. All current psychiatric medications have been reviewed and discussed with the patient and adjusted as clinically appropriate. The patient has been provided an accurate and updated list of the medications being now prescribed. Patient expressed understanding of how their medications were to be used.  Pt verbalized understanding and verbal consent obtained for treatment.   The risk of un-intended pregnancy is low based on the fact that pt reports she had tubal ligation. Pt is aware that these meds carry a teratogenic risk. Pt will discuss plan of action if she does or plans to become pregnant in the future.   Meds: Effexor XR 225 mg p.o. daily for MDD and GAD Vistaril 10 mg p.o. 3 times daily as needed anxiety related to GAD- no refill today Restart Cytomel po qD for MDD augmentation   Labs: none   Therapy: brief supportive therapy provided. Discussed psychosocial stressors in detail.       Consultations:  Patient  encouraged to restart therapy now that she has insurance and has identified several issues she would like to work on   Pt  denies plan or intent and is at an acute low risk for suicide. Patient told to call clinic if any problems occur. Patient advised to go to ER if they should develop SI/HI, side effects, or if symptoms worsen. Has crisis numbers to call if needed. Pt verbalized understanding.   F/up in 10 weeks or sooner if needed    Oletta DarterSalina Idan Prime, MD 01/21/2018, 10:34 AM

## 2018-02-03 ENCOUNTER — Ambulatory Visit (HOSPITAL_COMMUNITY)
Admission: EM | Admit: 2018-02-03 | Discharge: 2018-02-03 | Disposition: A | Discharge status: Home / Self Care | Payer: BLUE CROSS/BLUE SHIELD | Attending: Family Medicine | Admitting: Family Medicine

## 2018-02-03 ENCOUNTER — Encounter (HOSPITAL_COMMUNITY): Payer: Self-pay | Admitting: Emergency Medicine

## 2018-02-03 DIAGNOSIS — J4521 Mild intermittent asthma with (acute) exacerbation: Secondary | ICD-10-CM | POA: Insufficient documentation

## 2018-02-03 MED ORDER — METHYLPREDNISOLONE SODIUM SUCC 125 MG IJ SOLR
INTRAMUSCULAR | Status: AC
  Filled 2018-02-03: qty 2

## 2018-02-03 MED ORDER — IPRATROPIUM-ALBUTEROL 0.5-2.5 (3) MG/3ML IN SOLN
3.0000 mL | Freq: Once | RESPIRATORY_TRACT | Status: AC
  Administered 2018-02-03: 3 mL via RESPIRATORY_TRACT

## 2018-02-03 MED ORDER — IPRATROPIUM-ALBUTEROL 0.5-2.5 (3) MG/3ML IN SOLN
RESPIRATORY_TRACT | Status: AC
  Filled 2018-02-03: qty 3

## 2018-02-03 MED ORDER — METHYLPREDNISOLONE SODIUM SUCC 125 MG IJ SOLR
125.0000 mg | Freq: Once | INTRAMUSCULAR | Status: AC
  Administered 2018-02-03: 125 mg via INTRAMUSCULAR

## 2018-02-03 NOTE — ED Notes (Signed)
Patient able to ambulate independently  

## 2018-02-03 NOTE — ED Triage Notes (Signed)
Pt here with asthma sx; pt sts solu medrol normally helps

## 2018-02-03 NOTE — Discharge Instructions (Signed)
Duoneb given in office Solu-medrol shot given in office Continue with at home inhaler as needed for shortness of breath and/or wheezing Follow up with PCP next week for reevaluation  Return here or go to ER if you have any new or worsening symptoms such as shortness of breath, difficulty breathing, accessory muscle use, rib retraction, or if symptoms do not improve with medication

## 2018-02-03 NOTE — ED Provider Notes (Addendum)
[facility]   [id number] 02/03/18 Arrival Time: 1101  Cc: SOB, and wheezing  SUBJECTIVE:  Nancy Barnes is a 48 y.o. female who presents with SOB and wheezing intermittently for the past month.  Denies positive sick exposure or precipitating event.  Has tried her at-home inhaler with temporary relief. Reports previous symptoms in the past that improved with solu-medrol.   Denies fever, chills, fatigue, sinus pain, rhinorrhea, sore throat, chest pain, nausea, changes in bowel or bladder habits.    ROS: As per HPI.  Past Medical History:  Diagnosis Date  . Anxiety   . Asthma   . Depression   . Environmental allergies    food/medication  . Headache(784.0)   . Hypertension   . Hypokalemia   . Pneumomediastinum (HCC)   . Seasonal allergies   . Suicidal ideations    Past Surgical History:  Procedure Laterality Date  . ABLATION    . BREAST BIOPSY  04/2011  . TUBAL LIGATION     Allergies  Allergen Reactions  . Nsaids Shortness Of Breath  . Shellfish Allergy Anaphylaxis  . Abilify [Aripiprazole] Other (See Comments)    hallucinations  . Amoxicillin   . Fish Allergy Other (See Comments)    Reaction unknown  . Latuda [Lurasidone Hcl] Other (See Comments)    Uncontrollable shaking, hopelessness.  Barbera Setters [Levofloxacin In D5w] Other (See Comments)    Reaction unknown  . Other Other (See Comments)    Reaction unknown to nuts  . Penicillins Other (See Comments)    Reaction unknown Has patient had a PCN reaction causing immediate rash, facial/tongue/throat swelling, SOB or lightheadedness with hypotension: Yes Has patient had a PCN reaction causing severe rash involving mucus membranes or skin necrosis: No Has patient had a PCN reaction that required hospitalization: No Has patient had a PCN reaction occurring within the last 10 years: No If all of the above answers are "NO", then may proceed with Cephalosporin use.  . Prednisone Other (See Comments)    Pruritus/  hives  . Seroquel [Quetiapine Fumarate] Other (See Comments)    Patient states it made her not care about the world and she just sat down and didn't get up.  . Sulfa Antibiotics Other (See Comments)    Reaction unknown  . Ativan [Lorazepam] Anxiety    Shakiness  . Xanax [Alprazolam] Anxiety    Makes pt. anxious   No current facility-administered medications on file prior to encounter.    Current Outpatient Medications on File Prior to Encounter  Medication Sig Dispense Refill  . albuterol (PROVENTIL HFA;VENTOLIN HFA) 108 (90 BASE) MCG/ACT inhaler Inhale 2 puffs into the lungs every 6 (six) hours as needed for wheezing or shortness of breath. 3 Inhaler 0  . cholecalciferol (VITAMIN D) 1000 units tablet Take 1,000 Units by mouth daily.    . Fluticasone-Salmeterol (ADVAIR) 250-50 MCG/DOSE AEPB Inhale 1 puff into the lungs 2 (two) times daily.    . hydrOXYzine (ATARAX/VISTARIL) 10 MG tablet Take 1 tablet (10 mg total) by mouth 3 (three) times daily as needed for anxiety. (Patient not taking: Reported on 01/21/2018) 90 tablet 2  . ipratropium-albuterol (DUONEB) 0.5-2.5 (3) MG/3ML SOLN Take 3 mLs by nebulization every 6 (six) hours as needed (shortness of breath/wheezing.).    Marland Kitchen liothyronine (CYTOMEL) 5 MCG tablet Take 1 tablet (5 mcg total) by mouth daily. 30 tablet 2  . losartan-hydrochlorothiazide (HYZAAR) 100-12.5 MG tablet Take 1 tablet by mouth daily.  1  . Magnesium 250 MG TABS  Take 250 mg by mouth daily.    Marland Kitchen. omeprazole (PRILOSEC) 20 MG capsule Take 1 capsule (20 mg total) by mouth daily.    . Probiotic Product (PROBIOTIC-10 PO) Take 1 tablet by mouth daily.     Marland Kitchen. venlafaxine XR (EFFEXOR-XR) 75 MG 24 hr capsule Take 3 capsules (225 mg total) by mouth daily with breakfast. 90 capsule 2    Social History   Socioeconomic History  . Marital status: Divorced    Spouse name: Not on file  . Number of children: Not on file  . Years of education: Not on file  . Highest education level: Not on  file  Occupational History  . 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 Occupation: Engineer, maintenance (IT)Volunteer Firefighter  Social Needs  . Financial resource strain: Not on file  . Food insecurity:    Worry: Not on file    Inability: Not on file  . Transportation needs:    Medical: Not on file    Non-medical: Not on file  Tobacco Use  . Smoking status: Never Smoker  . Smokeless tobacco: Never Used  Substance and Sexual Activity  . Alcohol use: Not Currently    Comment: socially  . Drug use: No  . Sexual activity: Yes    Partners: Male    Birth control/protection: None, Condom  Lifestyle  . Physical activity:    Days per week: Not on file    Minutes per session: Not on file  . Stress: Not on file  Relationships  . Social connections:    Talks on phone: Not on file    Gets together: Not on file    Attends religious service: Not on file    Active member of club or organization: Not on file    Attends meetings of clubs or organizations: Not on file    Relationship status: Not on file  . Intimate partner violence:    Fear of current or ex partner: Not on file    Emotionally abused: Not on file    Physically abused: Not on file    Forced sexual activity: Not on file  Other Topics Concern  . 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 Lives at home with 2 teenage children. Never smoker.   Family History  Problem Relation Age of Onset  . Heart attack Mother        Bypass x5  . Allergies Mother   . Diabetes Mellitus II Father   . Hypertension Father   . Allergies Daughter   . Allergies Maternal Grandmother   . Atrial fibrillation Maternal Grandmother   . Stroke Maternal Grandmother   . Suicidality Neg Hx   . Depression Neg Hx   . Anxiety disorder Neg Hx      OBJECTIVE:  Vitals:   02/03/18 1150  BP: (!) 153/103  Pulse: 91  Resp: 18  Temp: 98.8 F (37.1 C)  TempSrc:  Temporal  SpO2: 98%     General appearance: Alert, well-appearing, nontoxic; speaking in full sentences without difficulty HEENT:NCAT; Ears: EACs clear, TMs pearly gray; Eyes: PERRL.  EOM grossly intact. Nose: nares patent without rhinorrhea, mildly erythematous; Throat: tonsils nonerythematous or enlarged, uvula midline  Neck: supple without LAD Lungs: mild diffuse wheezes throughout bilateral lung fields; improved following duo-neb treatment Heart: regular rate and rhythm.  Radial pulses 2+ symmetrical bilaterally Skin: warm and dry Psychological: alert  and cooperative; normal mood and affect  ASSESSMENT & PLAN:  1. Mild intermittent asthma with exacerbation     Meds ordered this encounter  Medications  . ipratropium-albuterol (DUONEB) 0.5-2.5 (3) MG/3ML nebulizer solution 3 mL  . methylPREDNISolone sodium succinate (SOLU-MEDROL) 125 mg/2 mL injection 125 mg   Duoneb given in office Solu-medrol shot given in office.  Prefers solu-medrol shot vs. Prednisone, due to rash and itching associated with prednisone.   Continue with at home inhaler as needed for shortness of breath and/or wheezing Follow up with PCP next week for reevaluation  Return here or go to ER if you have any new or worsening symptoms such as shortness of breath, difficulty breathing, accessory muscle use, rib retraction, or if symptoms do not improve with medication  Reviewed expectations re: course of current medical issues. Questions answered. Outlined signs and symptoms indicating need for more acute intervention. Patient verbalized understanding. After Visit Summary given.          Rennis HardingWurst, Havanna Groner, PA-C 02/03/18 1325    Alvino ChapelWurst, MamouBrittany, PA-C 02/03/18 1326

## 2018-03-15 DIAGNOSIS — J4550 Severe persistent asthma, uncomplicated: Secondary | ICD-10-CM | POA: Diagnosis not present

## 2018-03-15 DIAGNOSIS — Z881 Allergy status to other antibiotic agents status: Secondary | ICD-10-CM | POA: Diagnosis not present

## 2018-03-15 DIAGNOSIS — Z91018 Allergy to other foods: Secondary | ICD-10-CM | POA: Diagnosis not present

## 2018-03-15 DIAGNOSIS — J310 Chronic rhinitis: Secondary | ICD-10-CM | POA: Diagnosis not present

## 2018-03-19 ENCOUNTER — Ambulatory Visit: Payer: BLUE CROSS/BLUE SHIELD | Admitting: Family Medicine

## 2018-03-19 ENCOUNTER — Encounter: Payer: Self-pay | Admitting: Family Medicine

## 2018-03-19 VITALS — BP 150/90 | HR 81 | Temp 98.3°F | Ht 66.0 in | Wt 183.2 lb

## 2018-03-19 DIAGNOSIS — R5383 Other fatigue: Secondary | ICD-10-CM | POA: Diagnosis not present

## 2018-03-19 DIAGNOSIS — I10 Essential (primary) hypertension: Secondary | ICD-10-CM

## 2018-03-19 DIAGNOSIS — R0602 Shortness of breath: Secondary | ICD-10-CM

## 2018-03-19 DIAGNOSIS — N62 Hypertrophy of breast: Secondary | ICD-10-CM | POA: Diagnosis not present

## 2018-03-19 DIAGNOSIS — R0789 Other chest pain: Secondary | ICD-10-CM

## 2018-03-19 DIAGNOSIS — Z13220 Encounter for screening for lipoid disorders: Secondary | ICD-10-CM

## 2018-03-19 DIAGNOSIS — J4540 Moderate persistent asthma, uncomplicated: Secondary | ICD-10-CM | POA: Diagnosis not present

## 2018-03-19 DIAGNOSIS — Z1239 Encounter for other screening for malignant neoplasm of breast: Secondary | ICD-10-CM

## 2018-03-19 DIAGNOSIS — Z87898 Personal history of other specified conditions: Secondary | ICD-10-CM

## 2018-03-19 LAB — COMPREHENSIVE METABOLIC PANEL
ALT: 19 U/L (ref 0–35)
AST: 14 U/L (ref 0–37)
Albumin: 4.5 g/dL (ref 3.5–5.2)
Alkaline Phosphatase: 133 U/L — ABNORMAL HIGH (ref 39–117)
BUN: 8 mg/dL (ref 6–23)
CHLORIDE: 100 meq/L (ref 96–112)
CO2: 30 mEq/L (ref 19–32)
CREATININE: 0.69 mg/dL (ref 0.40–1.20)
Calcium: 9.2 mg/dL (ref 8.4–10.5)
GFR: 91.37 mL/min (ref >60.00)
Glucose, Bld: 84 mg/dL (ref 70–99)
Potassium: 3.9 mEq/L (ref 3.5–5.1)
Sodium: 139 mEq/L (ref 135–145)
Total Bilirubin: 0.5 mg/dL (ref 0.2–1.2)
Total Protein: 7.5 g/dL (ref 6.0–8.3)

## 2018-03-19 LAB — CBC WITH DIFFERENTIAL/PLATELET
Basophils Absolute: 0.1 10*3/uL (ref 0.0–0.1)
Basophils Relative: 0.8 % (ref 0.0–3.0)
Eosinophils Absolute: 0.4 10*3/uL (ref 0.0–0.7)
Eosinophils Relative: 5.5 % — ABNORMAL HIGH (ref 0.0–5.0)
HCT: 38 % (ref 36.0–46.0)
Hemoglobin: 12.8 g/dL (ref 12.0–15.0)
LYMPHS ABS: 2.1 10*3/uL (ref 0.7–4.0)
Lymphocytes Relative: 28.7 % (ref 12.0–46.0)
MCHC: 33.6 g/dL (ref 30.0–36.0)
MCV: 77.8 fl — ABNORMAL LOW (ref 78.0–100.0)
Monocytes Absolute: 0.3 10*3/uL (ref 0.1–1.0)
Monocytes Relative: 4.8 % (ref 3.0–12.0)
NEUTROS ABS: 4.3 10*3/uL (ref 1.4–7.7)
NEUTROS PCT: 60.2 % (ref 43.0–77.0)
Platelets: 390 10*3/uL (ref 150.0–400.0)
RBC: 4.88 Mil/uL (ref 3.87–5.11)
RDW: 14.8 % (ref 11.5–15.5)
WBC: 7.2 10*3/uL (ref 4.0–10.5)

## 2018-03-19 LAB — LIPID PANEL
Cholesterol: 209 mg/dL — ABNORMAL HIGH (ref 0–200)
HDL: 57 mg/dL (ref >39.00)
LDL Cholesterol: 128 mg/dL — ABNORMAL HIGH (ref 0–99)
NonHDL: 151.67
Total CHOL/HDL Ratio: 4
Triglycerides: 119 mg/dL (ref 0.0–149.0)
VLDL: 23.8 mg/dL (ref 0.0–40.0)

## 2018-03-19 LAB — IBC + FERRITIN
Ferritin: 11 ng/mL (ref 10.0–291.0)
Iron: 22 ug/dL — ABNORMAL LOW (ref 42–145)
Saturation Ratios: 4.7 % — ABNORMAL LOW (ref 20.0–50.0)
Transferrin: 331 mg/dL (ref 212.0–360.0)

## 2018-03-19 LAB — TSH: TSH: 1.19 u[IU]/mL (ref 0.35–4.50)

## 2018-03-19 MED ORDER — ALBUTEROL SULFATE HFA 108 (90 BASE) MCG/ACT IN AERS: 2.0000 | INHALATION_SPRAY | Freq: Four times a day (QID) | RESPIRATORY_TRACT | 0 refills | Status: DC | PRN

## 2018-03-19 MED ORDER — LOSARTAN POTASSIUM 100 MG PO TABS: 100.0000 mg | ORAL_TABLET | Freq: Every day | ORAL | 3 refills | Status: DC

## 2018-03-19 NOTE — Patient Instructions (Signed)
I will call you with bloodwork results when I have them.  You will hear regarding scheduling for mammogram and stress testing in next 2 weeks.

## 2018-03-19 NOTE — Progress Notes (Signed)
Nancy Barnes DOB: [birth year] Encounter date: 03/19/2018  This is a 48 y.o. female who presents to establish care. Chief Complaint  Patient presents with  . New Patient (Initial Visit)    fasting    History of present illness: Previous provider was in [place] and not within [facility] system so she was wanting provider closer and within system.   No specific concerns today except nearly out of proair so would like that refilled. Has parents that have cardiac issues and would like baseline EKG. IN 2014 was hospitalized with pneumomediastinum. Dad has had 6 stents; mom has had 5 bypasses and stents. She wants to exercise but would like to make sure no cardiac issues first. No exercise at all currently. When working at [employer] previously and was staying busy was able to lose weight.   Large breast size stays there. Hurts neck, back, shoulders. Cup size is G. Bra digs into shoulders bilat. Doesn't reduce with weight loss. Biggest weight loss was when going through divorce in 2008 and she went from size 12 to size 4-6. That was when depression also took a big turn downward. Breast size didn't change at all with that weight loss. Uncomfortable for her on daily basis.   Had breast biopsy in recent year; was adenoma. Has seen obgyn in past. [facility].   Asthma: (In Urgent care with asthma exacerbation 01/2018). On advair, albuterol. Duoneb. Saw allergy/asthma (Dr. Fransico Setters) - they think more related to vocal cord dysfunction. Wanted her to start Breo; she hasn't had this filled yet. Also wanted her to use nasal spray atrovent 2 sprays BID in ea nostril). Has some chest tightness that she notes with activity. If moving too much it is hard to breathe. Feels more in central chest. Not sure if coming from asthma, heart, deconditioning. Hasn't exercised since pneumomediatstinum because was worried about how she felt afterwards.   Anxiety/Depression:vistaril 10mg  TID prn; effexor 225mg  daily: follows with  psychiatry Dr. Michae Kava -   Allergies:allergist doesn't think she has allergies (per patient) but vocal cord issue and post nasal drainage related. Advised stopping anti-histamine.   HTN: on hyzaar 100-12.5 but has been off for a couple of months. States that she goes through periods where it gets really low on medication (pressures like 90 systolic) and then will stop medication and do fine for awhile. Lisinopril didn't seem effective. Tried another medication (not sure what) but body didn't feel good on it.   GERD: on omeprazole. Well controlled.    Past Medical History:  Diagnosis Date  . Anxiety   . Asthma   . Chicken pox   . Depression   . Environmental allergies    food/medication  . Headache(784.0)   . Hypertension   . Hypokalemia   . Pneumomediastinum (HCC)   . Seasonal allergies   . Suicidal ideations   . UTI (urinary tract infection)    Past Surgical History:  Procedure Laterality Date  . ABLATION    . BREAST BIOPSY  04/2011  . TUBAL LIGATION     Allergies  Allergen Reactions  . Nsaids Shortness Of Breath  . Shellfish Allergy Anaphylaxis  . Abilify [Aripiprazole] Other (See Comments)    hallucinations  . Amoxicillin   . Fish Allergy Other (See Comments)    Reaction unknown  . Latuda [Lurasidone Hcl] Other (See Comments)    Uncontrollable shaking, hopelessness.  Barbera Setters [Levofloxacin In D5w] Other (See Comments)    Reaction unknown  . Other Other (See Comments)  Reaction unknown to nuts  . Penicillins Other (See Comments)    Reaction unknown Has patient had a PCN reaction causing immediate rash, facial/tongue/throat swelling, SOB or lightheadedness with hypotension: Yes Has patient had a PCN reaction causing severe rash involving mucus membranes or skin necrosis: No Has patient had a PCN reaction that required hospitalization: No Has patient had a PCN reaction occurring within the last 10 years: No If all of the above answers are "NO", then may proceed  with Cephalosporin use.  . Prednisone Other (See Comments)    Pruritus/ hives  . Seroquel [Quetiapine Fumarate] Other (See Comments)    Patient states it made her not care about the world and she just sat down and didn't get up.  . Sulfa Antibiotics Other (See Comments)    Reaction unknown  . Ativan [Lorazepam] Anxiety    Shakiness  . Xanax [Alprazolam] Anxiety    Makes pt. anxious   Current Meds  Medication Sig  . albuterol (PROVENTIL HFA;VENTOLIN HFA) 108 (90 Base) MCG/ACT inhaler Inhale 2 puffs into the lungs every 6 (six) hours as needed for wheezing or shortness of breath.  . hydrOXYzine (ATARAX/VISTARIL) 10 MG tablet Take 1 tablet (10 mg total) by mouth 3 (three) times daily as needed for anxiety.  Marland Kitchen ipratropium-albuterol (DUONEB) 0.5-2.5 (3) MG/3ML SOLN Take 3 mLs by nebulization every 6 (six) hours as needed (shortness of breath/wheezing.).  Marland Kitchen liothyronine (CYTOMEL) 5 MCG tablet Take 1 tablet (5 mcg total) by mouth daily.  . Magnesium 250 MG TABS Take 250 mg by mouth daily.  Marland Kitchen omeprazole (PRILOSEC) 20 MG capsule Take 1 capsule (20 mg total) by mouth daily.  . Probiotic Product (PROBIOTIC-10 PO) Take 1 tablet by mouth daily.   Marland Kitchen venlafaxine XR (EFFEXOR-XR) 75 MG 24 hr capsule Take 3 capsules (225 mg total) by mouth daily with breakfast.  . [DISCONTINUED] albuterol (PROVENTIL HFA;VENTOLIN HFA) 108 (90 BASE) MCG/ACT inhaler Inhale 2 puffs into the lungs every 6 (six) hours as needed for wheezing or shortness of breath.  . [DISCONTINUED] cholecalciferol (VITAMIN D) 1000 units tablet Take 1,000 Units by mouth daily.  . [DISCONTINUED] Fluticasone-Salmeterol (ADVAIR) 250-50 MCG/DOSE AEPB Inhale 1 puff into the lungs 2 (two) times daily.  . [DISCONTINUED] losartan-hydrochlorothiazide (HYZAAR) 100-12.5 MG tablet Take 1 tablet by mouth daily.   Social History   Tobacco Use  . Smoking status: Never Smoker  . Smokeless tobacco: Never Used  Substance Use Topics  . Alcohol use: Not  Currently    Comment: socially   Family History  Problem Relation Age of Onset  . Heart attack Mother 38       Bypass x5  . Allergies Mother   . CAD Mother   . High blood pressure Mother   . High Cholesterol Mother   . Heart disease Mother   . Arthritis Mother   . Diabetes Mellitus II Father   . Hypertension Father   . Heart disease Father   . CAD Father   . Other Father        Agent orange exposure  . Arthritis Father   . Depression Father   . Allergies Daughter   . Psoriasis Daughter   . Arthritis Daughter        psoriatic  . Allergies Maternal Grandmother   . Atrial fibrillation Maternal Grandmother   . Stroke Maternal Grandmother   . Asthma Maternal Grandfather   . Asthma Paternal Grandmother   . Asthma Paternal Grandfather   . Suicidality Neg Hx   .  Anxiety disorder Neg Hx      Review of Systems  Constitutional: Negative for chills, fatigue and fever.  Respiratory: Positive for chest tightness, shortness of breath and wheezing. Negative for cough.   Cardiovascular: Negative for chest pain, palpitations and leg swelling.  Musculoskeletal: Positive for back pain and neck pain.  Psychiatric/Behavioral: The patient is not nervous/anxious.     Objective:  BP (!) 150/90 (BP Location: Left Arm, Patient Position: Sitting, Cuff Size: Normal)   Pulse 81   Temp 98.3 F (36.8 C) (Oral)   Ht  (1.676 m)   Wt 183 lb 3.2 oz (83.1 kg)   SpO2 97%   BMI 29.57 kg/m   Weight: 183 lb 3.2 oz (83.1 kg)   BP Readings from Last 3 Encounters:  03/19/18 (!) 150/90  02/03/18 (!) 153/103  01/21/18 122/84   Wt Readings from Last 3 Encounters:  03/19/18 183 lb 3.2 oz (83.1 kg)  01/21/18 186 lb (84.4 kg)  10/15/17 184 lb 9.6 oz (83.7 kg)    Physical Exam Constitutional:      General: She is not in acute distress.    Appearance: She is well-developed.  Cardiovascular:     Rate and Rhythm: Normal rate and regular rhythm.     Heart sounds: Normal heart sounds. No  murmur. No friction rub.  Pulmonary:     Effort: Pulmonary effort is normal. No respiratory distress.     Breath sounds: Normal breath sounds. No wheezing or rales.  Chest:     Comments: 46 in mid breast in bra; 37 in under breasts.  Abdominal:     General: Abdomen is flat. Bowel sounds are normal.     Palpations: Abdomen is soft.  Musculoskeletal:     Right lower leg: No edema.     Left lower leg: No edema.  Neurological:     Mental Status: She is alert and oriented to person, place, and time.  Psychiatric:        Behavior: Behavior normal.     Assessment/Plan:  1. Large breasts She has large and heavy breasts that cause daily neck pain and have not decreased in size in spite of weight loss in the past. - Ambulatory referral to Plastic Surgery  2. Chest tightness EKG stable in the office today.  Due to family history, chest tightness with activity level we will get a stress test. She is personally lower risk and I do not feel imaging study is needed at this time. Certainly can eval further if any abnormality. Otherwise would recommend working on asthma control, modification of risk factors (controlling bp and cholesterol).  - EKG 12-Lead - EXERCISE TOLERANCE TEST (ETT); Future  3. Other fatigue - CBC with Differential/Platelet; Future - Comprehensive metabolic panel; Future - TSH; Future - TSH - Comprehensive metabolic panel - CBC with Differential/Platelet - IBC + Ferritin  4. Moderate persistent asthma, unspecified whether complicated - albuterol (PROVENTIL HFA;VENTOLIN HFA) 108 (90 Base) MCG/ACT inhaler; Inhale 2 puffs into the lungs every 6 (six) hours as needed for wheezing or shortness of breath.  Dispense: 3 Inhaler; Refill: 0  5. Lipid screening - Lipid panel; Future - Lipid panel  6. Essential hypertension Will restart losartan but without the hctz (she prefers not to have diuretic and since she has had issues with hypotension in past we will leave this off).  Will have her follow up for recheck. - losartan (COZAAR) 100 MG tablet; Take 1 tablet (100 mg total) by mouth daily.  Dispense: 90 tablet; Refill: 3  7. Screening for breast cancer - MM Digital Diagnostic Bilat; Future - MM Digital Diagnostic Bilat  8. History of abnormal mammogram - MM Digital Diagnostic Bilat; Future - MM Digital Diagnostic Bilat  9. Shortness of breath on exertion - EXERCISE TOLERANCE TEST (ETT); Future  Return pending bloodwork.  Theodis Shove, MD

## 2018-04-01 ENCOUNTER — Encounter (HOSPITAL_COMMUNITY): Payer: Self-pay | Admitting: Psychiatry

## 2018-04-01 ENCOUNTER — Ambulatory Visit (INDEPENDENT_AMBULATORY_CARE_PROVIDER_SITE_OTHER): Payer: Self-pay | Admitting: Psychiatry

## 2018-04-01 ENCOUNTER — Other Ambulatory Visit: Payer: Self-pay

## 2018-04-01 VITALS — BP 159/110 | HR 77 | Temp 98.3°F | Resp 18 | Wt 181.8 lb

## 2018-04-01 DIAGNOSIS — Z818 Family history of other mental and behavioral disorders: Secondary | ICD-10-CM

## 2018-04-01 DIAGNOSIS — F332 Major depressive disorder, recurrent severe without psychotic features: Secondary | ICD-10-CM

## 2018-04-01 DIAGNOSIS — F99 Mental disorder, not otherwise specified: Secondary | ICD-10-CM

## 2018-04-01 DIAGNOSIS — R509 Fever, unspecified: Secondary | ICD-10-CM

## 2018-04-01 DIAGNOSIS — F5105 Insomnia due to other mental disorder: Secondary | ICD-10-CM

## 2018-04-01 DIAGNOSIS — R5381 Other malaise: Secondary | ICD-10-CM

## 2018-04-01 DIAGNOSIS — R197 Diarrhea, unspecified: Secondary | ICD-10-CM

## 2018-04-01 DIAGNOSIS — F411 Generalized anxiety disorder: Secondary | ICD-10-CM

## 2018-04-01 MED ORDER — VENLAFAXINE HCL ER 75 MG PO CP24: 225.0000 mg | ORAL_CAPSULE | Freq: Every day | ORAL | 2 refills | Status: DC

## 2018-04-01 MED ORDER — HYDROXYZINE HCL 10 MG PO TABS: 10.0000 mg | ORAL_TABLET | Freq: Three times a day (TID) | ORAL | 2 refills | Status: DC | PRN

## 2018-04-01 MED ORDER — LIOTHYRONINE SODIUM 5 MCG PO TABS: 5.0000 ug | ORAL_TABLET | Freq: Every day | ORAL | 2 refills | Status: DC

## 2018-04-01 NOTE — Progress Notes (Signed)
BH MD/PA/NP OP Progress Note  04/01/2018 10:45 AM Nancy Barnes  MRN:  [id number]  Chief Complaint:  Chief Complaint    Anxiety; Depression; Follow-up     HPI: Patient comes in wearing a mask.  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  She is denying any current symptoms of fever, cough or shortness of breath.  She is reporting that she had diarrhea yesterday.  She is not sure if this is related to covaried infection, stress/anxiety or eating unhealthy.  Monyae states that the agency has not given them any direction and continues to mandate that they work in the field or office as usual.  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  Patient feels especially anxious due to this.  She is decided that she wants to look for a job.  She is endorsing restlessness, racing thoughts, and insomnia. Pt has not been taking much Vistaril. She states her depression is stable and denies SI/HI.    Visit Diagnosis:    ICD-10-CM   1. GAD (generalized anxiety disorder) F41.1 venlafaxine XR (EFFEXOR-XR) 75 MG 24 hr capsule    hydrOXYzine (ATARAX/VISTARIL) 10 MG tablet  2. Severe episode of recurrent major depressive disorder, without psychotic features (HCC) F33.2 liothyronine (CYTOMEL) 5 MCG tablet    venlafaxine XR (EFFEXOR-XR) 75 MG 24 hr capsule  3. Insomnia due to other mental disorder F51.05 hydrOXYzine (ATARAX/VISTARIL) 10 MG tablet   F99       Past Psychiatric History:  Anxiety: Yes Bipolar Disorder: No Depression: Yes Mania: No Psychosis: No Schizophrenia: No Personality Disorder: No Hospitalization for psychiatric illness: Yes History of Electroconvulsive Shock Therapy: No Prior Suicide Attempts: Yes Previous meds: Wellbutrin   Past Medical History:  Past Medical History:  Diagnosis Date  . Anxiety   . Asthma   . Chicken pox   . Depression   . Environmental  allergies    food/medication  . Headache(784.0)   . Hypertension   . Hypokalemia   . Pneumomediastinum (HCC)   . Seasonal allergies   . Suicidal ideations   . UTI (urinary tract infection)     Past Surgical History:  Procedure Laterality Date  . ABLATION    . BREAST BIOPSY  04/2011  . TUBAL LIGATION      Family Psychiatric History:  Family History  Problem Relation Age of Onset  . Heart attack Mother 79       Bypass x5  . Allergies Mother   . CAD Mother   . High blood pressure Mother   . High Cholesterol Mother   . Heart disease Mother   . Arthritis Mother   . Diabetes Mellitus II Father   . Hypertension Father   . Heart disease Father   . CAD Father   . Other Father        Agent orange exposure  . Arthritis Father   . Depression Father   . Allergies Daughter   . Psoriasis Daughter   . Arthritis Daughter        psoriatic  . Allergies Maternal Grandmother   . Atrial fibrillation Maternal Grandmother   . Stroke Maternal Grandmother   . Asthma Maternal Grandfather   . Asthma Paternal Grandmother   . Asthma Paternal Grandfather   . Suicidality Neg Hx   . Anxiety disorder Neg Hx     Social History:  Social  History   Socioeconomic History  . Marital status: Divorced    Spouse name: Not on file  . Number of children: Not on file  . Years of education: Not on file  . Highest education level: Not on file  Occupational History  . 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 Occupation: Engineer, maintenance (IT)  . Financial resource strain: Not on file  . Food insecurity:    Worry: Not on file    Inability: Not on file  . Transportation needs:    Medical: Not on file    Non-medical: Not on file  Tobacco Use  . Smoking status: Never Smoker  . Smokeless tobacco: Never Used  Substance and Sexual Activity  . Alcohol use: Not Currently    Comment: socially  . Drug use: No  .  Sexual activity: Yes    Partners: Male    Birth control/protection: None, Condom  Lifestyle  . Physical activity:    Days per week: Not on file    Minutes per session: Not on file  . Stress: Not on file  Relationships  . Social connections:    Talks on phone: Not on file    Gets together: Not on file    Attends religious service: Not on file    Active member of club or organization: Not on file    Attends meetings of clubs or organizations: Not on file    Relationship status: Not on file  Other Topics Concern  . Not on file  Social History Narrative   Works for [employer]. Lives at home with 2 teenage children. Never smoker.    Allergies:  Allergies  Allergen Reactions  . Nsaids Shortness Of Breath  . Shellfish Allergy Anaphylaxis  . Abilify [Aripiprazole] Other (See Comments)    hallucinations  . Amoxicillin   . Fish Allergy Other (See Comments)    Reaction unknown  . Latuda [Lurasidone Hcl] Other (See Comments)    Uncontrollable shaking, hopelessness.  Barbera Setters [Levofloxacin In D5w] Other (See Comments)    Reaction unknown  . Other Other (See Comments)    Reaction unknown to nuts  . Penicillins Other (See Comments)    Reaction unknown Has patient had a PCN reaction causing immediate rash, facial/tongue/throat swelling, SOB or lightheadedness with hypotension: Yes Has patient had a PCN reaction causing severe rash involving mucus membranes or skin necrosis: No Has patient had a PCN reaction that required hospitalization: No Has patient had a PCN reaction occurring within the last 10 years: No If all of the above answers are "NO", then may proceed with Cephalosporin use.  . Prednisone Other (See Comments)    Pruritus/ hives  . Seroquel [Quetiapine Fumarate] Other (See Comments)    Patient states it made her not care about the world and she just sat down and didn't get up.  . Sulfa Antibiotics Other (See Comments)    Reaction unknown  . Ativan  [Lorazepam] Anxiety    Shakiness  . Xanax [Alprazolam] Anxiety    Makes pt. anxious    Metabolic Disorder Labs: No results found for: HGBA1C, MPG No results found for: PROLACTIN Lab Results  Component Value Date   CHOL 209 (H) 03/19/2018   TRIG 119.0 03/19/2018   HDL 57.00 03/19/2018   CHOLHDL 4 03/19/2018   VLDL 23.8 03/19/2018   LDLCALC 128 (H) 03/19/2018   Lab Results  Component Value  Date   TSH 1.19 03/19/2018    Therapeutic Level Labs: No results found for: LITHIUM No results found for: VALPROATE No components found for:  CBMZ  Current Medications: Current Outpatient Medications  Medication Sig Dispense Refill  . albuterol (PROVENTIL HFA;VENTOLIN HFA) 108 (90 Base) MCG/ACT inhaler Inhale 2 puffs into the lungs every 6 (six) hours as needed for wheezing or shortness of breath. 3 Inhaler 0  . BREO ELLIPTA 200-25 MCG/INH AEPB Inhale 200 mcg into the lungs daily.    . hydrOXYzine (ATARAX/VISTARIL) 10 MG tablet Take 1 tablet (10 mg total) by mouth 3 (three) times daily as needed for anxiety. 90 tablet 2  . ipratropium (ATROVENT) 0.03 % nasal spray Place 0.03 sprays into the nose 2 (two) times daily. 2 sprays each nostril twice daily    . ipratropium-albuterol (DUONEB) 0.5-2.5 (3) MG/3ML SOLN Take 3 mLs by nebulization every 6 (six) hours as needed (shortness of breath/wheezing.).    Marland Kitchen liothyronine (CYTOMEL) 5 MCG tablet Take 1 tablet (5 mcg total) by mouth daily. 30 tablet 2  . losartan (COZAAR) 100 MG tablet Take 1 tablet (100 mg total) by mouth daily. 90 tablet 3  . omeprazole (PRILOSEC) 20 MG capsule Take 1 capsule (20 mg total) by mouth daily.    . Probiotic Product (PROBIOTIC-10 PO) Take 1 tablet by mouth daily.     Marland Kitchen venlafaxine XR (EFFEXOR-XR) 75 MG 24 hr capsule Take 3 capsules (225 mg total) by mouth daily with breakfast. 90 capsule 2  . Magnesium 250 MG TABS Take 250 mg by mouth daily.     No current facility-administered medications for this visit.       Musculoskeletal: Strength & Muscle Tone: within normal limits Gait & Station: normal Patient leans: N/A   Psychiatric Specialty Exam: Review of Systems  Constitutional: Positive for fever and malaise/fatigue. Negative for chills.       2 days ago 99.5, none since then  Respiratory: Negative for cough, sputum production and shortness of breath.   Gastrointestinal: Positive for diarrhea. Negative for nausea and vomiting.    Blood pressure (!) 159/110, pulse 77, temperature 98.3 F (36.8 C), resp. rate 18, weight 181 lb 12.8 oz (82.5 kg).Body mass index is 29.34 kg/m.  General Appearance: Casual and wearing a mask due to potential exposure by someone who may have COVID-19  Eye Contact:  Good  Speech:  Clear and Coherent and Normal Rate  Volume:  Normal  Mood:  Anxious  Affect:  Congruent  Thought Process:  Goal Directed and Descriptions of Associations: Intact  Orientation:  Full (Time, Place, and Person)  Thought Content:  Logical  Suicidal Thoughts:  No  Homicidal Thoughts:  No  Memory:  Immediate;   Good  Judgement:  Good  Insight:  Good  Psychomotor Activity:  Normal  Concentration:  Concentration: Good  Recall:  Good  Fund of Knowledge:  Good  Language:  Good  Akathisia:  No  Handed:  Right  AIMS (if indicated):     Assets:  Communication Skills Desire for Improvement Financial Resources/Insurance Housing Leisure Time Physical Health Resilience Social Support Talents/Skills Transportation Vocational/Educational  ADL's:  Intact  Cognition:  WNL  Sleep:   poor for the last several days          Screenings: AUDIT     [facility]  Alcohol Use Disorder Identification Test Final Score (AUDIT)  0    PHQ2-9  Office Visit from 03/19/2018 in [facility] [place]  PHQ-2 Total Score  1        I reviewed the information below on 04/01/2018 and have updated  it Assessment and Plan: MDD-recurrent, severe without psychotic features; GAD; insomnia   Current status of symptoms: Significant increase in anxiety symptoms, reports depression is stable   Medication management with supportive therapy. Risks and benefits, side effects and alternative treatment options discussed with patient. Pt was given an opportunity to ask questions about medication, illness, and treatment. All current psychiatric medications have been reviewed and discussed with the patient and adjusted as clinically appropriate. The patient has been provided an accurate and updated list of the medications being now prescribed. Patient expressed understanding of how their medications were to be used.  Pt verbalized understanding and verbal consent obtained for treatment.   The risk of un-intended pregnancy is low based on the fact that pt reports she had tubal ligation. Pt is aware that these meds carry a teratogenic risk. Pt will discuss plan of action if she does or plans to become pregnant in the future.   Meds: Effexor XR 225 mg p.o. daily for MDD and GAD Vistaril 10 mg p.o. 3 times daily as needed anxiety related to GAD Cytomel po qD for MDD augmentation   Labs: none   Therapy: brief supportive therapy provided. Discussed psychosocial stressors in detail.    We spent a significant amount of time discussing coronavirus and her lack of support at work.  I educated her on transmission, safety precautions, crisis plan if symptoms were to develop.  She verbalized understanding   Consultations:  Patient will be encouraged to restart therapy now that she has insurance and has identified several issues she would like to work on    Pt denies plan or intent and is at an acute low risk for suicide. Patient told to call clinic if any problems occur. Patient advised to go to ER if they should develop SI/HI, side effects, or if symptoms worsen. Has crisis numbers to call if needed. Pt  verbalized understanding.   F/up in 8 weeks or sooner if needed    [name], MD 04/01/2018, 10:45 AM

## 2018-04-08 ENCOUNTER — Other Ambulatory Visit: Payer: Self-pay | Admitting: Family Medicine

## 2018-04-08 DIAGNOSIS — Z1231 Encounter for screening mammogram for malignant neoplasm of breast: Secondary | ICD-10-CM

## 2018-04-29 ENCOUNTER — Ambulatory Visit: Payer: BLUE CROSS/BLUE SHIELD | Admitting: Plastic Surgery

## 2018-04-29 ENCOUNTER — Other Ambulatory Visit: Payer: Self-pay

## 2018-04-29 ENCOUNTER — Encounter: Payer: Self-pay | Admitting: Plastic Surgery

## 2018-04-29 DIAGNOSIS — M546 Pain in thoracic spine: Secondary | ICD-10-CM

## 2018-04-29 DIAGNOSIS — M542 Cervicalgia: Secondary | ICD-10-CM | POA: Diagnosis not present

## 2018-04-29 DIAGNOSIS — N62 Hypertrophy of breast: Secondary | ICD-10-CM | POA: Insufficient documentation

## 2018-04-29 DIAGNOSIS — G8929 Other chronic pain: Secondary | ICD-10-CM

## 2018-04-29 DIAGNOSIS — M549 Dorsalgia, unspecified: Secondary | ICD-10-CM | POA: Insufficient documentation

## 2018-04-29 NOTE — Progress Notes (Signed)
Patient ID: Nancy Barnes, female    DOB: 07/25/1970, 48 y.o.   MRN: [id number]   Chief Complaint  Patient presents with  . Breast Problem    Mammary Hyperplasia: The patient is a 48 y.o. female with a history of mammary hyperplasia for several years.  She has extremely large breasts causing symptoms that include the following: Back pain (upper and lower) and neck pain. She frequently pins bra cups higher on straps for better lift and relief. Notices relief when holding breast up in her hands. Shoulder straps causing grooves, pain occasionally requiring padding. Pain medication is sometimes required with motrin and tylenol.  Activities that are hindered by enlarged breasts include: running and exercise.  Her breasts are extremely large and fairly symmetric.  She has hyperpigmentation of the inframammary area on both sides.  The sternal to nipple distance on the right is 28 cm and the left is 30 cm.  The IMF distance is 18 cm.  She is 5 feet 4 inches tall and weighs 183 pounds.  Preoperative bra size = 36 G cup. Would like to be a B/C cup The estimated excess breast tissue to be removed at the time of surgery = 850 grams on the left and 850 grams on the right.  Mammogram history: 2 years ago and was negative.  Had a biopsy of the left breast in 2015 which was an adenoma.    Review of Systems  Constitutional: Positive for activity change. Negative for appetite change.  HENT: Negative.   Eyes: Negative.   Respiratory: Negative.   Cardiovascular: Negative.   Gastrointestinal: Negative.   Endocrine: Negative.   Genitourinary: Negative.   Musculoskeletal: Positive for back pain and neck pain.  Skin: Negative.   Neurological: Negative.   Hematological: Negative.   Psychiatric/Behavioral: Negative.     Past Medical History:  Diagnosis Date  . Anxiety   . Asthma   . Chicken pox   . Depression   . Environmental allergies    food/medication  . Headache(784.0)   . Hypertension    . Hypokalemia   . Pneumomediastinum (HCC)   . Seasonal allergies   . Suicidal ideations   . UTI (urinary tract infection)     Past Surgical History:  Procedure Laterality Date  . ABLATION    . BREAST BIOPSY  04/2011  . TUBAL LIGATION        Current Outpatient Medications:  .  albuterol (PROVENTIL HFA;VENTOLIN HFA) 108 (90 Base) MCG/ACT inhaler, Inhale 2 puffs into the lungs every 6 (six) hours as needed for wheezing or shortness of breath., Disp: 3 Inhaler, Rfl: 0 .  BREO ELLIPTA 200-25 MCG/INH AEPB, Inhale 200 mcg into the lungs daily., Disp: , Rfl:  .  EPINEPHrine 0.3 mg/0.3 mL IJ SOAJ injection, Inject into the muscle., Disp: , Rfl:  .  hydrOXYzine (ATARAX/VISTARIL) 10 MG tablet, Take 1 tablet (10 mg total) by mouth 3 (three) times daily as needed for anxiety., Disp: 90 tablet, Rfl: 2 .  ipratropium (ATROVENT) 0.03 % nasal spray, Place 0.03 sprays into the nose 2 (two) times daily. 2 sprays each nostril twice daily, Disp: , Rfl:  .  ipratropium-albuterol (DUONEB) 0.5-2.5 (3) MG/3ML SOLN, Take 3 mLs by nebulization every 6 (six) hours as needed (shortness of breath/wheezing.)., Disp: , Rfl:  .  liothyronine (CYTOMEL) 5 MCG tablet, Take 1 tablet (5 mcg total) by mouth daily., Disp: 30 tablet, Rfl: 2 .  losartan (COZAAR) 100 MG tablet, Take 1 tablet (  100 mg total) by mouth daily., Disp: 90 tablet, Rfl: 3 .  omeprazole (PRILOSEC) 20 MG capsule, Take 1 capsule (20 mg total) by mouth daily., Disp: , Rfl:  .  Probiotic Product (PROBIOTIC-10 PO), Take 1 tablet by mouth daily. , Disp: , Rfl:  .  venlafaxine XR (EFFEXOR-XR) 75 MG 24 hr capsule, Take 3 capsules (225 mg total) by mouth daily with breakfast., Disp: 90 capsule, Rfl: 2 .  Magnesium 250 MG TABS, Take 250 mg by mouth daily., Disp: , Rfl:    Objective:   Vitals:   04/29/18 0931  BP: (!) 162/101  Pulse: 90  Temp: 98.8 F (37.1 C)  SpO2: 94%    Physical Exam Vitals signs and nursing note reviewed.  Constitutional:       Appearance: Normal appearance.  HENT:     Head: Normocephalic and atraumatic.     Right Ear: External ear normal.     Left Ear: External ear normal.     Mouth/Throat:     Mouth: Mucous membranes are moist.  Eyes:     Extraocular Movements: Extraocular movements intact.  Cardiovascular:     Rate and Rhythm: Normal rate.     Pulses: Normal pulses.  Pulmonary:     Effort: Pulmonary effort is normal.  Abdominal:     General: Abdomen is flat.     Palpations: Abdomen is soft.  Skin:    General: Skin is warm.  Neurological:     General: No focal deficit present.     Mental Status: She is alert and oriented to person, place, and time.  Psychiatric:        Mood and Affect: Mood normal.        Behavior: Behavior normal.     Assessment & Plan:  Chronic bilateral thoracic back pain  Neck pain  Symptomatic mammary hypertrophy  Will need a mammogram prior to surgery. Recommend bilateral breast reduction.   Nancy Bills Jermon Chalfant, DO

## 2018-04-30 ENCOUNTER — Institutional Professional Consult (permissible substitution): Payer: Self-pay | Admitting: Plastic Surgery

## 2018-05-10 ENCOUNTER — Ambulatory Visit (HOSPITAL_COMMUNITY)
Admission: AD | Admit: 2018-05-10 | Discharge: 2018-05-10 | Disposition: A | Discharge status: Home / Self Care | Payer: Self-pay | Attending: Psychiatry | Admitting: Psychiatry

## 2018-05-10 DIAGNOSIS — Z886 Allergy status to analgesic agent status: Secondary | ICD-10-CM | POA: Insufficient documentation

## 2018-05-10 DIAGNOSIS — J45909 Unspecified asthma, uncomplicated: Secondary | ICD-10-CM | POA: Insufficient documentation

## 2018-05-10 DIAGNOSIS — Z881 Allergy status to other antibiotic agents status: Secondary | ICD-10-CM | POA: Insufficient documentation

## 2018-05-10 DIAGNOSIS — I10 Essential (primary) hypertension: Secondary | ICD-10-CM | POA: Insufficient documentation

## 2018-05-10 DIAGNOSIS — F419 Anxiety disorder, unspecified: Secondary | ICD-10-CM | POA: Insufficient documentation

## 2018-05-10 DIAGNOSIS — Z91013 Allergy to seafood: Secondary | ICD-10-CM | POA: Insufficient documentation

## 2018-05-10 DIAGNOSIS — Z8249 Family history of ischemic heart disease and other diseases of the circulatory system: Secondary | ICD-10-CM | POA: Insufficient documentation

## 2018-05-10 DIAGNOSIS — Z818 Family history of other mental and behavioral disorders: Secondary | ICD-10-CM | POA: Insufficient documentation

## 2018-05-10 DIAGNOSIS — Z833 Family history of diabetes mellitus: Secondary | ICD-10-CM | POA: Insufficient documentation

## 2018-05-10 DIAGNOSIS — R45851 Suicidal ideations: Secondary | ICD-10-CM | POA: Insufficient documentation

## 2018-05-10 DIAGNOSIS — Z825 Family history of asthma and other chronic lower respiratory diseases: Secondary | ICD-10-CM | POA: Insufficient documentation

## 2018-05-10 DIAGNOSIS — Z880 Allergy status to penicillin: Secondary | ICD-10-CM | POA: Insufficient documentation

## 2018-05-10 DIAGNOSIS — Z888 Allergy status to other drugs, medicaments and biological substances status: Secondary | ICD-10-CM | POA: Insufficient documentation

## 2018-05-10 DIAGNOSIS — F331 Major depressive disorder, recurrent, moderate: Secondary | ICD-10-CM | POA: Insufficient documentation

## 2018-05-10 DIAGNOSIS — Z882 Allergy status to sulfonamides status: Secondary | ICD-10-CM | POA: Insufficient documentation

## 2018-05-10 NOTE — BH Assessment (Addendum)
Assessment Note  Nancy Barnes is a 48 y.o. female who was brought to [facility] by her son at her request due to the anxiety and SI she has been experiencing today. Pt shares she last experienced anxiety and SI 5 years ago (03/2013) and was hospitalized at that time; she states she has maintained appointments with her psychiatrist and has had therapy with the same therapist off-and-on since that time. Pt states that, today, she began having thoughts about "maybe if I just was not here" while she was in the middle of panicking; she shares that those thoughts scared her enough that she and her son tookher gun that she hunts with and left it at her parent's house, asking them to keep it for her, which they understood and agreed to do, as that has been their safety plan since the last time she experienced SI (5 years ago).  Pt states she has a prescription for Vistaril PRN, though she only takes it every several months. She states that she was experiencing diarrhea today, so she was worried that the medication was not working and didn't know if she should take it again. Pt states she felt as though none of her medication was working, which caused her more anxiety.  Pt acknowledged SI today; she states she has not experienced SI for 5 years. She states she had a plan to kill herself 5 years ago but never followed through with attempting it. Pt states she has been hospitalized on one occasion, which was also 5 years ago and was at [facility]. Pt denies past or current HI, AVH, NSSIB, involvement in the court system, access to weapons (including guns), and SA.  Pt is unable to identify what may be causing her anxiety, though she noted she has been nervous/upset about not seeing her boyfriend as frequently lately, which has caused her some anxiety. She shares that she had a dream about her ex husband last night, which was terrible, which she identifies could be causing her more anxiety today. Pt is  able to identify that the stress at work with her hours being cut, some co-workers being laid off, and the inability to see people due to the Coronavirus has increased her stress.  [name], NP, talked to pt's son in-person, with pt's consent, who agreed to ensure his mother returned to [facility] if her symptoms getting worse. He agreed to ensure the gun stayed at his grandparent's home.  Pt is oriented x4. Her recent and remote memory is intact. Pt was cooperative and pleasant throughout the assessment process. Pt's insight, judgement, and impulse control is good at this time.   Diagnosis: F33.1, Major depressive disorder, Recurrent episode, Moderate   Past Medical History:  Past Medical History:  Diagnosis Date  . Anxiety   . Asthma   . Chicken pox   . Depression   . Environmental allergies    food/medication  . Headache(784.0)   . Hypertension   . Hypokalemia   . Pneumomediastinum (HCC)   . Seasonal allergies   . Suicidal ideations   . UTI (urinary tract infection)     Past Surgical History:  Procedure Laterality Date  . ABLATION    . BREAST BIOPSY  04/2011  . TUBAL LIGATION      Family History:  Family History  Problem Relation Age of Onset  . Heart attack Mother 67       Bypass x5  . Allergies Mother   .  CAD Mother   . High blood pressure Mother   . High Cholesterol Mother   . Heart disease Mother   . Arthritis Mother   . Diabetes Mellitus II Father   . Hypertension Father   . Heart disease Father   . CAD Father   . Other Father        Agent orange exposure  . Arthritis Father   . Depression Father   . Allergies Daughter   . Psoriasis Daughter   . Arthritis Daughter        psoriatic  . Allergies Maternal Grandmother   . Atrial fibrillation Maternal Grandmother   . Stroke Maternal Grandmother   . Asthma Maternal Grandfather   . Asthma Paternal Grandmother   . Asthma Paternal Grandfather   . Suicidality Neg Hx   . Anxiety disorder Neg Hx      Social History:  reports that she has never smoked. She has never used smokeless tobacco. She reports previous alcohol use. She reports that she does not use drugs.  Additional Social History:  Alcohol / Drug Use Pain Medications: Please see MAR Prescriptions: Please see MAR Over the Counter: Please see MAR History of alcohol / drug use?: No history of alcohol / drug abuse Longest period of sobriety (when/how long): Pt denies SA  CIWA:   COWS:    Allergies:  Allergies  Allergen Reactions  . Nsaids Shortness Of Breath  . Shellfish Allergy Anaphylaxis  . Abilify [Aripiprazole] Other (See Comments)    hallucinations  . Amoxicillin   . Fish Allergy Other (See Comments)    Reaction unknown  . Latuda [Lurasidone Hcl] Other (See Comments)    Uncontrollable shaking, hopelessness.  Barbera Setters [Levofloxacin In D5w] Other (See Comments)    Reaction unknown  . Other Other (See Comments)    Reaction unknown to nuts  . Penicillins Other (See Comments)    Reaction unknown Has patient had a PCN reaction causing immediate rash, facial/tongue/throat swelling, SOB or lightheadedness with hypotension: Yes Has patient had a PCN reaction causing severe rash involving mucus membranes or skin necrosis: No Has patient had a PCN reaction that required hospitalization: No Has patient had a PCN reaction occurring within the last 10 years: No If all of the above answers are "NO", then may proceed with Cephalosporin use.  . Prednisone Other (See Comments)    Pruritus/ hives  . Seroquel [Quetiapine Fumarate] Other (See Comments)    Patient states it made her not care about the world and she just sat down and didn't get up.  . Sulfa Antibiotics Other (See Comments)    Reaction unknown  . Ativan [Lorazepam] Anxiety    Shakiness  . Xanax [Alprazolam] Anxiety    Makes pt. anxious    Home Medications: (Not in a hospital admission)   OB/GYN Status:  No LMP recorded. Patient has had an  ablation.  General Assessment Data TTS Assessment: In system Is this a Tele or Face-to-Face Assessment?: Face-to-Face Is this an Initial Assessment or a Re-assessment for this encounter?: Initial Assessment Patient Accompanied by:: N/A Language Other than English: No Living Arrangements: Other (Comment)(Pt lives with her 24-yo son & 26-yo daughter) What gender do you identify as?: Female Marital status: Long term relationship Juanell Fairly name: Berland Pregnancy Status: No Living Arrangements: Children Can pt return to current living arrangement?: Yes Admission Status: Voluntary Is patient capable of signing voluntary admission?: Yes Referral Source: Self/Family/Friend Insurance type: Unknown - not listed  Medical Screening Exam [facility] Walk-in  ONLY) Medical Exam completed: Yes  Crisis Care Plan Living Arrangements: Children Legal Guardian: (N/A) Name of Psychiatrist: Dr. Kenna GilbertArgarwal - [facility] Name of Therapist: Marjorie SmolderJennifer - [facility]  Education Status Is patient currently in school?: No Is the patient employed, unemployed or receiving disability?: Employed  Risk to self with the past 6 months Suicidal Ideation: Yes-Currently Present Has patient been a risk to self within the past 6 months prior to admission? : No Suicidal Intent: No Has patient had any suicidal intent within the past 6 months prior to admission? : No Is patient at risk for suicide?: No Suicidal Plan?: No(But pt took her gun to her parent's home for them to keep) Has patient had any suicidal plan within the past 6 months prior to admission? : No Access to Means: Yes Specify Access to Suicidal Means: Pt owns gun - took to her parent's home for them to keep What has been your use of drugs/alcohol within the last 12 months?: Pt denies SA Previous Attempts/Gestures: Yes How many times?: 1 Other Self Harm Risks: Pt is a people-person; Coronavirus isolation has been  difficult Triggers for Past Attempts: Unknown Intentional Self Injurious Behavior: None Family Suicide History: No Recent stressful life event(s): Other (Comment)(Isolation, boyfriend is out-of-town) Persecutory voices/beliefs?: No Depression: Yes Depression Symptoms: Isolating Substance abuse history and/or treatment for substance abuse?: No Suicide prevention information given to non-admitted patients: Not applicable  Risk to Others within the past 6 months Homicidal Ideation: No Does patient have any lifetime risk of violence toward others beyond the six months prior to admission? : No Thoughts of Harm to Others: No Current Homicidal Intent: No Current Homicidal Plan: No Access to Homicidal Means: No Identified Victim: None noted History of harm to others?: No Assessment of Violence: On admission Violent Behavior Description: None noted Does patient have access to weapons?: Yes (Comment)(Pt owns gun but took it to her parent's for them to keep) Criminal Charges Pending?: No Does patient have a court date: No Is patient on probation?: No  Psychosis Hallucinations: None noted Delusions: None noted  Mental Status Report Appearance/Hygiene: Unremarkable Eye Contact: Good Motor Activity: Unremarkable Speech: Logical/coherent Level of Consciousness: Alert Mood: Apprehensive, Pleasant Affect: Appropriate to circumstance Anxiety Level: Minimal Thought Processes: Coherent, Relevant Judgement: Unimpaired Orientation: Person, Place, Time, Situation Obsessive Compulsive Thoughts/Behaviors: Minimal  Cognitive Functioning Concentration: Normal Memory: Recent Intact, Remote Intact Is patient IDD: No Insight: Good Impulse Control: Good Appetite: Good Have you had any weight changes? : No Change Sleep: No Change Total Hours of Sleep: 8 Vegetative Symptoms: None  ADLScreening [facility]) Patient's cognitive ability adequate to safely complete daily  activities?: Yes Patient able to express need for assistance with ADLs?: Yes Independently performs ADLs?: Yes (appropriate for developmental age)  Prior Inpatient Therapy Prior Inpatient Therapy: Yes Prior Therapy Dates: 03/2013 [facility] Reason for Treatment: SI, Depression  Prior Outpatient Therapy Prior Outpatient Therapy: No Does patient have an ACCT team?: No Does patient have Intensive In-House Services?  : No Does patient have [facility] services? : No Does patient have P4CC services?: No  ADL Screening (condition at time of admission) Patient's cognitive ability adequate to safely complete daily activities?: Yes Is the patient deaf or have difficulty hearing?: No Does the patient have difficulty seeing, even when wearing glasses/contacts?: No Does the patient have difficulty concentrating, remembering, or making decisions?: No Patient able to express need for assistance with ADLs?: Yes Does the patient  have difficulty dressing or bathing?: No Independently performs ADLs?: Yes (appropriate for developmental age) Does the patient have difficulty walking or climbing stairs?: No Weakness of Legs: None Weakness of Arms/Hands: None  Home Assistive Devices/Equipment Home Assistive Devices/Equipment: None  Therapy Consults (therapy consults require a physician order) PT Evaluation Needed: No OT Evalulation Needed: No SLP Evaluation Needed: No Abuse/Neglect Assessment (Assessment to be complete while patient is alone) Abuse/Neglect Assessment Can Be Completed: Yes Physical Abuse: Denies Verbal Abuse: Denies Sexual Abuse: Denies Exploitation of patient/patient's resources: Denies Self-Neglect: Denies Values / Beliefs Cultural Requests During Hospitalization: None Spiritual Requests During Hospitalization: None Consults Spiritual Care Consult Needed: No Social Work Consult Needed: No Merchant navy officer (For Healthcare) Does Patient  Have a Medical Advance Directive?: No Would patient like information on creating a medical advance directive?: No - Patient declined        Disposition: [name], NP, determined pt does not meet criteria for inpatient hospitalization and that she can be d/c. Pt agreed to return to [facility] if she experienced the same symptoms again. Pt and her son agreed to keep pt's gun at her parent's house for the time being.   Disposition Initial Assessment Completed for this Encounter: Yes Disposition of Patient: Discharge(Jason Allyson Sabal, NP, determined pt can be d/c) Patient refused recommended treatment: No Mode of transportation if patient is discharged/movement?: Car Patient referred to: Other (Comment)(Pt advised to return if concerns return)  On Site Evaluation by:   Reviewed with Physician:    Ralph Dowdy 05/10/2018 11:27 PM

## 2018-05-11 NOTE — H&P (Signed)
Behavioral Health Medical Screening Exam  Nancy Barnes is an 48 y.o. female.  Total Time spent with patient: 30 minutes  Psychiatric Specialty Exam: Physical Exam  Constitutional: She is oriented to person, place, and time. She appears well-developed and well-nourished. No distress.  HENT:  Head: Normocephalic and atraumatic.  Right Ear: External ear normal.  Left Ear: External ear normal.  Eyes: Pupils are equal, round, and reactive to light. Right eye exhibits no discharge. Left eye exhibits no discharge.  Respiratory: Effort normal. No respiratory distress.  Musculoskeletal: Normal range of motion.  Neurological: She is alert and oriented to person, place, and time.  Skin: She is not diaphoretic.  Psychiatric: Her mood appears anxious. Her speech is not delayed and not tangential. She is not withdrawn and not actively hallucinating. Thought content is not paranoid and not delusional. Cognition and memory are normal. She does not express impulsivity or inappropriate judgment. She exhibits a depressed mood. She expresses no homicidal and no suicidal ideation.    Review of Systems  Constitutional: Negative for chills, diaphoresis, fever, malaise/fatigue and weight loss.  Respiratory: Negative for cough and shortness of breath.   Cardiovascular: Negative for chest pain.  Gastrointestinal: Positive for diarrhea. Negative for nausea and vomiting.  Psychiatric/Behavioral: Positive for depression and suicidal ideas. Negative for hallucinations, memory loss and substance abuse. The patient is nervous/anxious and has insomnia.     There were no vitals taken for this visit.There is no height or weight on file to calculate BMI.  General Appearance: Casual and Well Groomed  Eye Contact:  Good  Speech:  Clear and Coherent and Normal Rate  Volume:  Normal  Mood:  Anxious  Affect:  Congruent  Thought Process:  Coherent, Goal Directed and Descriptions of Associations: Intact  Orientation:   NA  Thought Content:  Logical and Hallucinations: None  Suicidal Thoughts:  No  Homicidal Thoughts:  No  Memory:  Immediate;   Good Recent;   Good  Judgement:  Good  Insight:  Good  Psychomotor Activity:  Normal  Concentration: Concentration: Good and Attention Span: Good  Recall:  Good  Fund of Knowledge:Good  Language: Good  Akathisia:  Negative  Handed:  Right  AIMS (if indicated):     Assets:  Communication Skills Desire for Improvement Financial Resources/Insurance Housing Intimacy Leisure Time Physical Health  Sleep:       Musculoskeletal: Strength & Muscle Tone: within normal limits Gait & Station: normal   Recommendations:  Based on my evaluation the patient does not appear to have an emergency medical condition.  No evidence of imminent risk to self or others at present.   Patient does not meet criteria for psychiatric inpatient admission. Supportive therapy provided about ongoing stressors. Discussed crisis plan, support from social network, calling 911, coming to the Emergency Department, and calling Suicide Hotline. Jackelyn Poling, NP 05/11/2018, 12:31 AM

## 2018-05-13 ENCOUNTER — Encounter: Payer: Self-pay | Admitting: Family Medicine

## 2018-05-14 ENCOUNTER — Other Ambulatory Visit: Payer: Self-pay | Admitting: Family Medicine

## 2018-05-14 MED ORDER — HYDROCHLOROTHIAZIDE 12.5 MG PO TABS: 12.5000 mg | ORAL_TABLET | Freq: Every day | ORAL | 1 refills | Status: DC

## 2018-05-18 ENCOUNTER — Encounter (HOSPITAL_COMMUNITY): Payer: Self-pay | Admitting: Psychiatry

## 2018-05-18 ENCOUNTER — Other Ambulatory Visit: Payer: Self-pay

## 2018-05-18 ENCOUNTER — Ambulatory Visit (INDEPENDENT_AMBULATORY_CARE_PROVIDER_SITE_OTHER): Payer: BLUE CROSS/BLUE SHIELD | Admitting: Psychiatry

## 2018-05-18 DIAGNOSIS — F341 Dysthymic disorder: Secondary | ICD-10-CM | POA: Diagnosis not present

## 2018-05-18 DIAGNOSIS — F411 Generalized anxiety disorder: Secondary | ICD-10-CM | POA: Diagnosis not present
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
## 2018-05-25 ENCOUNTER — Ambulatory Visit (INDEPENDENT_AMBULATORY_CARE_PROVIDER_SITE_OTHER): Payer: BLUE CROSS/BLUE SHIELD | Admitting: Psychiatry

## 2018-05-25 ENCOUNTER — Encounter (HOSPITAL_COMMUNITY): Payer: Self-pay | Admitting: Psychiatry

## 2018-05-25 ENCOUNTER — Other Ambulatory Visit: Payer: Self-pay

## 2018-05-25 DIAGNOSIS — F341 Dysthymic disorder: Secondary | ICD-10-CM

## 2018-05-25 DIAGNOSIS — F411 Generalized anxiety disorder: Secondary | ICD-10-CM

## 2018-05-25 NOTE — Progress Notes (Signed)
Virtual Visit via Video Note  I connected with Nancy Barnes on 05/25/18 at 12:30 PM EDT by a video enabled telemedicine application and verified that I am speaking with the correct person using two identifiers.  Location: Patient: Nancy Barnes Provider: Lise Auer, LCSW   I discussed the limitations of evaluation and management by telemedicine and the availability of in person appointments. The patient expressed understanding and agreed to proceed.  History of Present Illness:    Observations/Objective: Counselor met with Nancy Barnes for individual therapy via Webex. Counselor assessed MH symptoms and progress on treatment plan goals. Nancy Barnes shared that her hours were cut back again at work causing loss of income and insurance. This is causing her more frustration than anxiety or depression due to work-related management issues. Counselor provided solution focused interventions to address basic need considerations. Nancy Barnes shared her plan and frustrations, maintaining a rational approach. Counselor explored her support system. Nancy Barnes reported positive interactions with her significant other, which was helpful in coping with her current situation. Nancy Barnes denied suicidal ideation or self-harm  Assessment and Plan: Counselor will continue to meet with Nancy Barnes to address treatment plan goals. Nancy Barnes will continue to follow recommendations of providers and implement skills learned in session.  Follow Up Instructions: Counselor will send information for next session via Webex.    I discussed the assessment and treatment plan with the patient. The patient was provided an opportunity to ask questions and all were answered. The patient agreed with the plan and demonstrated an understanding of the instructions.   The patient was advised to call back or seek an in-person evaluation if the symptoms worsen or if the condition fails to improve as anticipated.  I provided 50 minutes of non-face-to-face time  during this encounter.   Lise Auer, LCSW

## 2018-06-03 ENCOUNTER — Other Ambulatory Visit: Payer: Self-pay

## 2018-06-03 ENCOUNTER — Encounter (HOSPITAL_COMMUNITY): Payer: Self-pay | Admitting: Psychiatry

## 2018-06-03 ENCOUNTER — Ambulatory Visit (INDEPENDENT_AMBULATORY_CARE_PROVIDER_SITE_OTHER): Payer: Self-pay | Admitting: Psychiatry

## 2018-06-03 DIAGNOSIS — F411 Generalized anxiety disorder: Secondary | ICD-10-CM

## 2018-06-03 DIAGNOSIS — F332 Major depressive disorder, recurrent severe without psychotic features: Secondary | ICD-10-CM

## 2018-06-03 DIAGNOSIS — F99 Mental disorder, not otherwise specified: Secondary | ICD-10-CM

## 2018-06-03 DIAGNOSIS — F5105 Insomnia due to other mental disorder: Secondary | ICD-10-CM

## 2018-06-03 MED ORDER — LIOTHYRONINE SODIUM 5 MCG PO TABS: 5.0000 ug | ORAL_TABLET | Freq: Every day | ORAL | 2 refills | Status: DC

## 2018-06-03 MED ORDER — VENLAFAXINE HCL ER 75 MG PO CP24: 225.0000 mg | ORAL_CAPSULE | Freq: Every day | ORAL | 2 refills | Status: DC

## 2018-06-03 MED ORDER — HYDROXYZINE HCL 10 MG PO TABS: 10.0000 mg | ORAL_TABLET | Freq: Three times a day (TID) | ORAL | 2 refills | Status: DC | PRN

## 2018-06-03 NOTE — Progress Notes (Signed)
Virtual Visit via Telephone Note  I connected with Nancy Barnes on 06/03/18 at 10:00 AM EDT by telephone and verified that I am speaking with the correct person using two identifiers.  Location: Patient: home Provider: home   I discussed the limitations, risks, security and privacy concerns of performing an evaluation and management service by telephone and the availability of in person appointments. I also discussed with the patient that there may be a patient responsible charge related to this service. The patient expressed understanding and agreed to proceed.   History of Present Illness: "I am just going to keep on going". She was cut down to part time and is only getting 15-20 hr/week. It started 2 weeks ago. She was told she would be on this schedule until things improved with COVID. It is very stressful. She has filed for unemployment and food stamps. She is looking for other jobs. Nancy Barnes lost her insurance due to being cut down to part time. She has restarted therapy with [facility] and it has helped. Her meds are working. Nancy Barnes is feeling very discouraged. Every time she gets "my head over the water something happens". 3 weeks ago she went to the ED due to what was later dx as a panic attack. Pt had been working from home for 1 month at that time. She had not been seeing her family for their safety. 3 weeks ago Nancy Barnes told her son to take her gun to her dad's house. She denied SI/HI but states she wanted to be safe in case of "crazy thoughts". Pt was overwhelmed. Her son took her over the [facility] Barnes crisis center. While talking with the intake person her anxiety improved and she calmed down. Since then her anxiety and frustration tolerance have slowly improved. Nancy Barnes states her depression is "ok". She denies SI/HI. She again stated removing the gun was a "precautionary thing". She is taking things day by day- "I am Nancy Barnes". She has good and bad days and states that is ok. Her boyfriend advised her to  start socializing again which has helped.    Observations/Objective: I spoke with Nancy Barnes on the phone.  She was calm and cooperative.  She was engaged in the conversation and answered questions appropriately.  Speech was with normal rate, volume and tone.  Speech was clear and coherent.  Thought processes are linear, goal directed and intact.  Thought content is logical.  Mood is anxious and depressed and affect is congruent.  She denies SI/HI.  She denies AVH and did not appear to be responding to internal stimuli.  Memory and attention are good.  Fund of knowledge and use of language are average.  Insight and judgment are good.  I am unable to comment on eye contact, hygiene or psychomotor activity as I was unable to physically see the patient.  Assessment and Plan: MDD- recurrent, severe without psychotic features; GAD; Insomnia Effexor XR 225 mg p.o. daily for MDD and GAD Vistaril 10 mg p.o. 3 times daily as needed anxiety Cytomel 5 mcg p.o. daily for adjunct treatment of MDD Pt does not want her meds changed at this time.  Patient plans on using the good Rx card for prescriptions  Follow Up Instructions: 2 months or sooner if needed   I discussed the assessment and treatment plan with the patient. The patient was provided an opportunity to ask questions and all were answered. The patient agreed with the plan and demonstrated an understanding of the instructions.   The patient was  advised to call back or seek an in-person evaluation if the symptoms worsen or if the condition fails to improve as anticipated.  I provided 20 minutes of non-face-to-face time during this encounter.   Oletta DarterSalina Lyell Clugston, MD

## 2018-06-08 ENCOUNTER — Encounter (HOSPITAL_COMMUNITY): Payer: Self-pay | Admitting: Psychiatry

## 2018-06-08 ENCOUNTER — Other Ambulatory Visit: Payer: Self-pay

## 2018-06-08 ENCOUNTER — Ambulatory Visit (INDEPENDENT_AMBULATORY_CARE_PROVIDER_SITE_OTHER): Payer: Self-pay | Admitting: Psychiatry

## 2018-06-08 DIAGNOSIS — F341 Dysthymic disorder: Secondary | ICD-10-CM

## 2018-06-08 DIAGNOSIS — F411 Generalized anxiety disorder: Secondary | ICD-10-CM

## 2018-06-08 NOTE — Progress Notes (Signed)
Virtual Visit via Video Note  I connected with Hubert Azure on 06/08/18 at  9:00 AM EDT by a video enabled telemedicine application and verified that I am speaking with the correct person using two identifiers.  Location: Patient: Nancy Barnes Provider: Lise Auer, LCSW   I discussed the limitations of evaluation and management by telemedicine and the availability of in person appointments. The patient expressed understanding and agreed to proceed.  History of Present Illness: GAD and Dysthymia due to stage of life issues and issues on the job.    Observations/Objective: Counselor met with [name] for individual therapy via Webex. Counselor assessed MH symptoms and progress on treatment plan goals. Alexiya denied suicidal ideation or self-harm behaviors. Bless shared that she is experiencing increased anxiety due to financial and work related issues and that her depressive symptoms are increasing due to unclear relationship with her significant other. Counselor explored her work related issues and used solution-focused and CBT interventions to address her problems. Counselor validated her feelings and shared information for Vocational Rehabilitation. Counselor explore her negative cognitions about her status, appearance, and self-concept. Counselor shared coping skills to combat negative cognitions. Counselor encouraged Reet in her self-work and summarized session.   Assessment and Plan: Counselor will continue to meet with [name] to address treatment plan goals. Jontavia will continue to follow recommendations of providers and implement skills learned in session.  Follow Up Instructions: Counselor will send information for next session via Webex.     I discussed the assessment and treatment plan with the patient. The patient was provided an opportunity to ask questions and all were answered. The patient agreed with the plan and demonstrated an understanding of the instructions.   The  patient was advised to call back or seek an in-person evaluation if the symptoms worsen or if the condition fails to improve as anticipated.  I provided 50 minutes of non-face-to-face time during this encounter.   Lise Auer, LCSW

## 2018-06-25 ENCOUNTER — Ambulatory Visit (HOSPITAL_COMMUNITY): Payer: Self-pay | Admitting: Psychiatry

## 2018-06-29 ENCOUNTER — Ambulatory Visit (HOSPITAL_COMMUNITY): Payer: Self-pay | Admitting: Psychiatry

## 2018-07-12 ENCOUNTER — Ambulatory Visit (HOSPITAL_COMMUNITY): Payer: Self-pay | Admitting: Psychiatry

## 2018-07-12 ENCOUNTER — Other Ambulatory Visit: Payer: Self-pay

## 2018-07-29 ENCOUNTER — Ambulatory Visit (INDEPENDENT_AMBULATORY_CARE_PROVIDER_SITE_OTHER): Payer: Self-pay | Admitting: Psychiatry

## 2018-07-29 ENCOUNTER — Encounter (HOSPITAL_COMMUNITY): Payer: Self-pay | Admitting: Psychiatry

## 2018-07-29 ENCOUNTER — Other Ambulatory Visit: Payer: Self-pay

## 2018-07-29 DIAGNOSIS — F5105 Insomnia due to other mental disorder: Secondary | ICD-10-CM

## 2018-07-29 DIAGNOSIS — F332 Major depressive disorder, recurrent severe without psychotic features: Secondary | ICD-10-CM

## 2018-07-29 DIAGNOSIS — F411 Generalized anxiety disorder: Secondary | ICD-10-CM

## 2018-07-29 DIAGNOSIS — F99 Mental disorder, not otherwise specified: Secondary | ICD-10-CM

## 2018-07-29 MED ORDER — VENLAFAXINE HCL ER 75 MG PO CP24: 225.0000 mg | ORAL_CAPSULE | Freq: Every day | ORAL | 3 refills | Status: DC

## 2018-07-29 MED ORDER — LIOTHYRONINE SODIUM 5 MCG PO TABS: 5.0000 ug | ORAL_TABLET | Freq: Every day | ORAL | 3 refills | Status: DC

## 2018-07-29 NOTE — Progress Notes (Signed)
Virtual Visit via Telephone Note  I connected with Nancy Barnes on 07/29/18 at  9:00 AM EDT by telephone and verified that I am speaking with the correct person using two identifiers.  Location: Patient: home Provider: office   I discussed the limitations, risks, security and privacy concerns of performing an evaluation and management service by telephone and the availability of in person appointments. I also discussed with the patient that there may be a patient responsible charge related to this service. The patient expressed understanding and agreed to proceed.   History of Present Illness: Pt states "I am alright". Her hours have been further cut back to 10 hrs/week. Pt is looking for a new job. 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 She will know by Friday if she got the job. "The depression is ok right now. I am doing alright with it". Pt is moving out her apartment and is moving to a cousin's home. It is bigger and is closer to her parents[place]. Moving has been overwhelmed. At night she has racing thoughts due to the move and work. She hasn't been able to calm down and sleep the way she usually dose. Despite this she is getting enough sleep and her energy is ok. Pt has been taking the Vistaril once a day lately and it helps to calm her significantly. Pt denies hopelessness. Pt denies SI/HI/AVH. "Other than that I am ok". In the summer she usually feels better, more motivated and active and energetic.    Observations/Objective: I spoke with Nancy Barnes on the phone.  Pt was calm, pleasant and cooperative.  Pt was engaged in the conversation and answered questions appropriately.  Speech was clear and coherent with normal rate, tone and volume.  Mood is mildly depressed and anxious, affect is full. Thought processes are coherent, goal oriented and intact.  Thought content is logical.  Pt denies SI/HI.   Pt denies  auditory and visual hallucinations and did not appear to be responding to internal stimuli.  Memory and concentration are good.  Fund of knowledge and use of language are average.  Insight and judgment are fair.  I am unable to comment on psychomotor activity, general appearance, hygiene, or eye contact as I was unable to physically see the patient on the phone.  Vital signs not available since interview conducted virtually.    Assessment and Plan: MDD-recurrent, severe without psychotic features; GAD; insomnia  Status of current symptoms: Depression and anxiety are improved and manageable  Continue Effexor XR 225 mg p.o. daily Vistaril 10 mg p.o. 3 times daily PRN anxiety-no refill today as patient reports she has enough  Cytomel 5 mg p.o. daily-adjunct treatment for depression  Patient does not want her medications to be changed at this time.  She feels that this combination is effective    Follow Up Instructions: In 2-3 months or sooner if needed   I discussed the assessment and treatment plan with the patient. The patient was provided an opportunity to ask questions and all were answered. The patient agreed with the plan and demonstrated an understanding of the instructions.   The patient was advised to call back or seek an in-person evaluation if the symptoms worsen or if the condition fails to improve as anticipated.  I provided 20 minutes of non-face-to-face time during this encounter.   Charlcie Cradle, MD

## 2018-08-03 ENCOUNTER — Other Ambulatory Visit: Payer: Self-pay

## 2018-08-03 ENCOUNTER — Ambulatory Visit (INDEPENDENT_AMBULATORY_CARE_PROVIDER_SITE_OTHER): Payer: Self-pay | Admitting: Psychiatry

## 2018-08-03 ENCOUNTER — Encounter (HOSPITAL_COMMUNITY): Payer: Self-pay | Admitting: Psychiatry

## 2018-08-03 DIAGNOSIS — F411 Generalized anxiety disorder: Secondary | ICD-10-CM

## 2018-08-03 DIAGNOSIS — F341 Dysthymic disorder: Secondary | ICD-10-CM

## 2018-08-03 NOTE — Progress Notes (Signed)
Virtual Visit via Telephone Note  I connected with Nancy Barnes on 08/03/18 at  9:00 AM EDT by telephone and verified that I am speaking with the correct person using two identifiers.  Location: Patient: Nancy Barnes Provider: Lise Auer, LCSW   I discussed the limitations, risks, security and privacy concerns of performing an evaluation and management service by telephone and the availability of in person appointments. I also discussed with the patient that there may be a patient responsible charge related to this service. The patient expressed understanding and agreed to proceed.   History of Present Illness: Dysthymia and GAD   Observations/Objective: Counselor met with Nancy Barnes for individual therapy via Webex. Counselor assessed MH symptoms and progress on treatment plan goals. Nancy Barnes denied suicidal ideation or self-harm behaviors. Nancy Barnes shared that her anxiety level is moderate to high at this time due to her job ending, starting a new job, family stressors and being in the process of moving. Nancy Barnes was able to identify cognitive distortions about her situation and relationships. Counselor was able to thought challenge her and she could reframe. Counselor explored the positive changes in her situation and Nancy Barnes was able to identify gratitude statements and positive affirmations. Counselor explored her intimate partner relationship teaching and promoting healthy communication skills and expression of her needs.   Assessment and Plan: Counselor will continue to meet with Nancy Barnes to address treatment plan goals. Nancy Barnes will continue to follow recommendations of providers and implement skills learned in session.  Follow Up Instructions: Counselor will send information for next session via Webex.     I discussed the assessment and treatment plan with the patient. The patient was provided an opportunity to ask questions and all were answered. The patient agreed with the plan and  demonstrated an understanding of the instructions.   The patient was advised to call back or seek an in-person evaluation if the symptoms worsen or if the condition fails to improve as anticipated.  I provided 55 minutes of non-face-to-face time during this encounter.   Lise Auer, LCSW

## 2018-09-09 ENCOUNTER — Other Ambulatory Visit: Payer: Self-pay

## 2018-09-09 ENCOUNTER — Telehealth (HOSPITAL_COMMUNITY): Payer: Self-pay | Admitting: Psychiatry

## 2018-09-09 ENCOUNTER — Ambulatory Visit (HOSPITAL_COMMUNITY): Payer: Self-pay | Admitting: Psychiatry

## 2018-10-07 ENCOUNTER — Encounter (HOSPITAL_COMMUNITY): Payer: Self-pay | Admitting: Psychiatry

## 2018-10-07 ENCOUNTER — Ambulatory Visit (INDEPENDENT_AMBULATORY_CARE_PROVIDER_SITE_OTHER): Payer: Self-pay | Admitting: Psychiatry

## 2018-10-07 ENCOUNTER — Other Ambulatory Visit: Payer: Self-pay

## 2018-10-07 DIAGNOSIS — F411 Generalized anxiety disorder: Secondary | ICD-10-CM

## 2018-10-07 DIAGNOSIS — F332 Major depressive disorder, recurrent severe without psychotic features: Secondary | ICD-10-CM

## 2018-10-07 MED ORDER — LIOTHYRONINE SODIUM 5 MCG PO TABS: 5.0000 ug | ORAL_TABLET | Freq: Every day | ORAL | 3 refills | Status: DC

## 2018-10-07 MED ORDER — VENLAFAXINE HCL ER 75 MG PO CP24: 225.0000 mg | ORAL_CAPSULE | Freq: Every day | ORAL | 3 refills | Status: DC

## 2018-10-07 NOTE — Progress Notes (Signed)
  Virtual Visit via Telephone Note  I connected with Nancy Barnes  on 10/07/18 at  9:00 AM EDT by telephone and verified that I am speaking with the correct person using two identifiers.  Location: Patient: home Provider: office   I discussed the limitations, risks, security and privacy concerns of performing an evaluation and management service by telephone and the availability of in person appointments. I also discussed with the patient that there may be a patient responsible charge related to this service. The patient expressed understanding and agreed to proceed.   History of Present Illness: "I am doing good". Pt is working as a Agricultural consultant- setting up displays.  She tells me that she likes the work.  She has lost 10 pounds because it is a lot of manual work but that is on a bad thing.  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  She is thinking about how to manage her career goals and is reconsidering peer support work.  She is expressing some sadness as her grandmother is very sick and just had a stroke.  Her mother is having a hard time dealing with it.  Despite this Nancy Barnes does not feel as though she is depressed.  She is denying hopelessness.  She denies SI/HI.  She states she is having some mild anxiety but has not needed to take Vistaril.  Her sleep is good.  Overall she feels as though she is doing well.  Her insurance kicks in in November.    Observations/Objective: I spoke with Nancy Barnes on the phone.  Pt was calm, pleasant and cooperative.  Pt was engaged in the conversation and answered questions appropriately.  Speech was clear and coherent with normal rate, tone and volume.  Mood is euthymic and affect is full.  Thought processes are coherent, goal oriented and intact.  Thought content is logical.  Pt denies SI/HI.   Pt denies auditory and visual hallucinations and did not appear to be responding to  internal stimuli.  Memory and concentration are good.  Fund of knowledge and use of language are average.  Insight and judgment are fair.  I am unable to comment on psychomotor activity, general appearance, hygiene, or eye contact as I was unable to physically see the patient on the phone.  Vital signs not available since interview conducted virtually.     Assessment and Plan: MDD-recurrent, severe without psychotic features; GAD; Insomnia  Status of current symptoms: improvement in anxiety and depression  Effexor XR 225mg  po qD for depression and anxiety  Cytomel 5mg  po daily for adjunct treatment for depression  Follow Up Instructions: In 8-12 weeks or sooner if needed   I discussed the assessment and treatment plan with the patient. The patient was provided an opportunity to ask questions and all were answered. The patient agreed with the plan and demonstrated an understanding of the instructions.   The patient was advised to call back or seek an in-person evaluation if the symptoms worsen or if the condition fails to improve as anticipated.  I provided 20 minutes of non-face-to-face time during this encounter.   Charlcie Cradle, MD

## 2018-11-25 ENCOUNTER — Other Ambulatory Visit: Payer: Self-pay

## 2018-11-25 ENCOUNTER — Encounter (HOSPITAL_COMMUNITY): Payer: Self-pay | Admitting: Psychiatry

## 2018-11-25 ENCOUNTER — Ambulatory Visit (INDEPENDENT_AMBULATORY_CARE_PROVIDER_SITE_OTHER): Payer: 59 | Admitting: Psychiatry

## 2018-11-25 DIAGNOSIS — F411 Generalized anxiety disorder: Secondary | ICD-10-CM | POA: Diagnosis not present

## 2018-11-25 DIAGNOSIS — F332 Major depressive disorder, recurrent severe without psychotic features: Secondary | ICD-10-CM

## 2018-11-25 MED ORDER — LIOTHYRONINE SODIUM 5 MCG PO TABS: 5.0000 ug | ORAL_TABLET | Freq: Every day | ORAL | 0 refills | Status: DC

## 2018-11-25 MED ORDER — VENLAFAXINE HCL ER 75 MG PO CP24: 225.0000 mg | ORAL_CAPSULE | Freq: Every day | ORAL | 0 refills | Status: DC

## 2018-11-25 NOTE — Progress Notes (Signed)
Virtual Visit via Video Note  I connected with Nancy Barnes on 11/25/18 at  9:30 AM EST by a video enabled telemedicine application and verified that I am speaking with the correct person using two identifiers.  Location: Patient: home Provider: office   I discussed the limitations of evaluation and management by telemedicine and the availability of in person appointments. The patient expressed understanding and agreed to proceed.  History of Present Illness: "I am doing ok". Her grandmother passed on 10/29/22 and pt misses her. Despite that she is dealing with it. Pt has moved and really likes it. She has started a new job and also likes it. She moves [place] and stays busy. She has 3 day weekends. It is very physical and she has 15 lbs which is good. Her depression is "ok". "Really and truly I am ok. I am working thru things going on in life. I am still doing a lot of grieving and praying". Pt might move into her mom's old house at some point but it is not sure yet. Her sleep is good. Pt has a new kitten. Her anxiety "is ok. It's doing alright". Pt now has insurance and is relieved.    Observations/Objective: There were no vitals taken for this visit.There is no height or weight on file to calculate BMI.  General Appearance: Casual  Eye Contact:  Good  Speech:  Clear and Coherent and Normal Rate  Volume:  Normal  Mood:  Depressed  Affect:  Full Range  Thought Process:  Goal Directed, Linear and Descriptions of Associations: Intact  Orientation:  Full (Time, Place, and Person)  Thought Content:  Logical  Suicidal Thoughts:  No  Homicidal Thoughts:  No  Memory:  Immediate;   Good  Judgement:  Good  Insight:  Good  Psychomotor Activity:  Normal  Concentration:  Concentration: Good and Attention Span: Good  Recall:  Good  Fund of Knowledge:  Good  Language:  Good  Akathisia:  No  Handed:  Right  AIMS (if indicated):     Assets:  Communication Skills Desire for  Improvement Financial Resources/Insurance Housing Leisure Time [place] Talents/Skills Transportation Vocational/Educational  ADL's:  Intact  Cognition:  WNL  Sleep:         I reviewed the information below on 11/25/2018 and have updated it Assessment and Plan: MDD-recurrent, severe without psychotic features; GAD; Insomnia   Status of current symptoms: stable   Effexor XR 225mg  po qD for depression and anxiety   Cytomel 5mg  po daily for adjunct treatment for depression   Follow Up Instructions: In 2-3 months or sooner if needed   I discussed the assessment and treatment plan with the patient. The patient was provided an opportunity to ask questions and all were answered. The patient agreed with the plan and demonstrated an understanding of the instructions.   The patient was advised to call back or seek an in-person evaluation if the symptoms worsen or if the condition fails to improve as anticipated.  I provided 20 minutes of non-face-to-face time during this encounter.   Charlcie Cradle, MD

## 2019-02-17 ENCOUNTER — Other Ambulatory Visit: Payer: Self-pay

## 2019-02-17 ENCOUNTER — Encounter (HOSPITAL_COMMUNITY): Payer: Self-pay | Admitting: Psychiatry

## 2019-02-17 ENCOUNTER — Ambulatory Visit (INDEPENDENT_AMBULATORY_CARE_PROVIDER_SITE_OTHER): Payer: 59 | Admitting: Psychiatry

## 2019-02-17 DIAGNOSIS — F411 Generalized anxiety disorder: Secondary | ICD-10-CM

## 2019-02-17 DIAGNOSIS — F332 Major depressive disorder, recurrent severe without psychotic features: Secondary | ICD-10-CM | POA: Diagnosis not present

## 2019-02-17 DIAGNOSIS — F99 Mental disorder, not otherwise specified: Secondary | ICD-10-CM

## 2019-02-17 DIAGNOSIS — F5105 Insomnia due to other mental disorder: Secondary | ICD-10-CM

## 2019-02-17 MED ORDER — LIOTHYRONINE SODIUM 5 MCG PO TABS: 5.0000 ug | ORAL_TABLET | Freq: Every day | ORAL | 0 refills | Status: DC

## 2019-02-17 MED ORDER — VENLAFAXINE HCL ER 75 MG PO CP24: 225.0000 mg | ORAL_CAPSULE | Freq: Every day | ORAL | 0 refills | Status: DC

## 2019-02-17 NOTE — Progress Notes (Signed)
Virtual Visit via Telephone Note  I connected with Nancy Barnes on 02/17/19 at  9:00 AM EST by telephone and verified that I am speaking with the correct person using two identifiers.  Location: Patient: home Provider: office    I discussed the limitations, risks, security and privacy concerns of performing an evaluation and management service by telephone and the availability of in person appointments. I also discussed with the patient that there may be a patient responsible charge related to this service. The patient expressed understanding and agreed to proceed.   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 Nancy Barnes really likes the job because it keeps her busy. 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  Nancy Barnes likes to stay busy. She developed a fever after her 2nd COVID vaccine and had to quarantine for 2 weeks. During the time she went "stir crazy" and had a panic attack related to boredom. Nancy Barnes has only taken Vistaril 3x since our last visit. Her depression is manageable. She lost her GM in October. She was 89yo. It is  still hard because they were neighbors for 35ys. Nancy Barnes misses her and still cries some. Her other concern is that she is not as focused as she used to be. She is not sleeping well due to hot flashes. Nancy Barnes is in menopause. She denies SI/HI.    Observations/Objective:  General Appearance: unable to assess  Eye Contact:  unable to assess  Speech:  Clear and Coherent and Normal Rate  Volume:  Normal  Mood:  Euthymic  Affect:  Full Range  Thought Process:  Goal Directed, Linear and Descriptions of Associations: Intact  Orientation:  Full (Time, Place, and Person)  Thought Content:  Logical  Suicidal Thoughts:  No  Homicidal Thoughts:  No  Memory:  Immediate;   Good  Judgement:  Good  Insight:  Good  Psychomotor  Activity:  unable to assess  Concentration:  Concentration: Good  Recall:  Good  Fund of Knowledge:  Good  Language:  Good  Akathisia:  unable to assess  Handed:  Right  AIMS (if indicated):     Assets:  Communication Skills Desire for Improvement Financial Resources/Insurance Housing Resilience Social Support Talents/Skills Transportation Vocational/Educational  ADL's:  Unable to assess  Cognition:  WNL  Sleep:        Assessment and Plan: 1. Severe episode of recurrent major depressive disorder, without psychotic features ([place])  - liothyronine (CYTOMEL) 5 MCG tablet; Take 1 tablet (5 mcg total) by mouth daily.  Dispense: 90 tablet; Refill: 0 - venlafaxine XR (EFFEXOR-XR) 75 MG 24 hr capsule; Take 3 capsules (225 mg total) by mouth daily with breakfast.  Dispense: 270 capsule; Refill: 0  2. GAD (generalized anxiety disorder)  - venlafaxine XR (EFFEXOR-XR) 75 MG 24 hr capsule; Take 3 capsules (225 mg total) by mouth daily with breakfast.  Dispense: 270 capsule; Refill: 0 - Vistaril 10mg  po TIDprn anxiety  3. Insomnia due to other mental disorder - vistaril 10mg  po TID prn anxiety or insomnia-- no refill today as pt rarely takes it   Follow Up Instructions: In 3 months or sooner if needed   I discussed the assessment and treatment plan with the patient. The patient was provided an opportunity to ask questions and all were answered. The patient agreed with the plan and demonstrated an understanding of the instructions.  The patient was advised to call back or seek an in-person evaluation if the symptoms worsen or if the condition fails to improve as anticipated.  I provided 20 minutes of non-face-to-face time during this encounter.   [name], MD

## 2019-02-18 ENCOUNTER — Other Ambulatory Visit: Payer: Self-pay | Admitting: Family Medicine

## 2019-02-18 DIAGNOSIS — J4540 Moderate persistent asthma, uncomplicated: Secondary | ICD-10-CM

## 2019-04-03 ENCOUNTER — Other Ambulatory Visit: Payer: Self-pay | Admitting: Family Medicine

## 2019-04-03 DIAGNOSIS — J4540 Moderate persistent asthma, uncomplicated: Secondary | ICD-10-CM

## 2019-05-07 ENCOUNTER — Other Ambulatory Visit: Payer: Self-pay | Admitting: Family Medicine

## 2019-05-07 DIAGNOSIS — J4540 Moderate persistent asthma, uncomplicated: Secondary | ICD-10-CM

## 2019-05-17 ENCOUNTER — Telehealth (HOSPITAL_COMMUNITY): Payer: Self-pay

## 2019-05-17 NOTE — Telephone Encounter (Signed)
FYI- pharmacy sent fax stating pt's insurance requires 90 day supply of future rx of venlaxafine. Upcoming appointment 5/6.

## 2019-05-19 ENCOUNTER — Other Ambulatory Visit: Payer: Self-pay

## 2019-05-19 ENCOUNTER — Telehealth (INDEPENDENT_AMBULATORY_CARE_PROVIDER_SITE_OTHER): Payer: 59 | Admitting: Psychiatry

## 2019-05-19 ENCOUNTER — Encounter (HOSPITAL_COMMUNITY): Payer: Self-pay | Admitting: Psychiatry

## 2019-05-19 DIAGNOSIS — F99 Mental disorder, not otherwise specified: Secondary | ICD-10-CM

## 2019-05-19 DIAGNOSIS — F332 Major depressive disorder, recurrent severe without psychotic features: Secondary | ICD-10-CM | POA: Diagnosis not present

## 2019-05-19 DIAGNOSIS — F411 Generalized anxiety disorder: Secondary | ICD-10-CM | POA: Diagnosis not present

## 2019-05-19 DIAGNOSIS — F5105 Insomnia due to other mental disorder: Secondary | ICD-10-CM | POA: Diagnosis not present

## 2019-05-19 MED ORDER — VENLAFAXINE HCL ER 75 MG PO CP24: 225.0000 mg | ORAL_CAPSULE | Freq: Every day | ORAL | 0 refills | Status: DC

## 2019-05-19 MED ORDER — LIOTHYRONINE SODIUM 5 MCG PO TABS: 5.0000 ug | ORAL_TABLET | Freq: Every day | ORAL | 0 refills | Status: DC

## 2019-05-19 MED ORDER — HYDROXYZINE HCL 10 MG PO TABS: 10.0000 mg | ORAL_TABLET | Freq: Three times a day (TID) | ORAL | 0 refills | Status: DC | PRN

## 2019-05-19 NOTE — BH Specialist Note (Signed)
Virtual Visit via Telephone Note  I connected with Nancy Barnes on 05/19/19 at  9:00 AM EDT by telephone and verified that I am speaking with the correct person using two identifiers.  Location: Patient: home Provider: office   I discussed the limitations, risks, security and privacy concerns of performing an evaluation and management service by telephone and the availability of in person appointments. I also discussed with the patient that there may be a patient responsible charge related to this service. The patient expressed understanding and agreed to proceed.   History of Present Illness: Nancy Barnes shares that she has been more irritable lately. She thinks it is due to being tired from doing overtime at work. She is missing her grandmother. Depression is manageable. She has been some moments of anxiety with stress. [name] has not been engaging in self care much lately due to work schedule. Sleep is good She denies SI/HI.    Observations/Objective:  General Appearance: unable to assess  Eye Contact:  unable to assess  Speech:  Clear and Coherent and Normal Rate  Volume:  Normal  Mood:  Anxious and Depressed  Affect:  Full Range  Thought Process:  Goal Directed, Linear and Descriptions of Associations: Intact  Orientation:  Full (Time, Place, and Person)  Thought Content:  Logical  Suicidal Thoughts:  No  Homicidal Thoughts:  No  Memory:  Immediate;   Good  Judgement:  Good  Insight:  Good  Psychomotor Activity: unable to assess  Concentration:  Concentration: Good  Recall:  Good  Fund of Knowledge:  Good  Language:  Good  Akathisia:  unable to assess  Handed:  Right  AIMS (if indicated):     Assets:  Communication Skills Desire for Improvement Financial Resources/Insurance Housing Resilience Social Support Talents/Skills Transportation Vocational/Educational  ADL's:  unable to assess  Cognition:  WNL  Sleep:         Assessment and Plan: MDD- recurrent,  severe without psychotic features; GAD; Insomnia  Effexor XR 225mg  po qD Vistaril 10mg  po TID prn anxiety Cytomel 5mg  po qD   Follow Up Instructions: In 2-3 months or sooner if needed   I discussed the assessment and treatment plan with the patient. The patient was provided an opportunity to ask questions and all were answered. The patient agreed with the plan and demonstrated an understanding of the instructions.   The patient was advised to call back or seek an in-person evaluation if the symptoms worsen or if the condition fails to improve as anticipated.  I provided 15 minutes of non-face-to-face time during this encounter.   , MD

## 2019-05-27 ENCOUNTER — Other Ambulatory Visit: Payer: Self-pay

## 2019-05-30 ENCOUNTER — Encounter: Payer: Self-pay | Admitting: Family Medicine

## 2019-05-30 ENCOUNTER — Ambulatory Visit (INDEPENDENT_AMBULATORY_CARE_PROVIDER_SITE_OTHER): Payer: 59 | Admitting: Family Medicine

## 2019-05-30 VITALS — BP 150/110 | HR 73 | Temp 97.7°F | Ht 66.5 in | Wt 176.8 lb

## 2019-05-30 DIAGNOSIS — R5383 Other fatigue: Secondary | ICD-10-CM

## 2019-05-30 DIAGNOSIS — Z13220 Encounter for screening for lipoid disorders: Secondary | ICD-10-CM

## 2019-05-30 DIAGNOSIS — F341 Dysthymic disorder: Secondary | ICD-10-CM

## 2019-05-30 DIAGNOSIS — N62 Hypertrophy of breast: Secondary | ICD-10-CM | POA: Diagnosis not present

## 2019-05-30 DIAGNOSIS — E611 Iron deficiency: Secondary | ICD-10-CM

## 2019-05-30 DIAGNOSIS — R0683 Snoring: Secondary | ICD-10-CM

## 2019-05-30 DIAGNOSIS — Z0000 Encounter for general adult medical examination without abnormal findings: Secondary | ICD-10-CM

## 2019-05-30 DIAGNOSIS — I10 Essential (primary) hypertension: Secondary | ICD-10-CM

## 2019-05-30 DIAGNOSIS — J45909 Unspecified asthma, uncomplicated: Secondary | ICD-10-CM

## 2019-05-30 LAB — COMPREHENSIVE METABOLIC PANEL
ALT: 14 U/L (ref 0–35)
AST: 13 U/L (ref 0–37)
Albumin: 4.4 g/dL (ref 3.5–5.2)
Alkaline Phosphatase: 124 U/L — ABNORMAL HIGH (ref 39–117)
BUN: 7 mg/dL (ref 6–23)
CO2: 31 mEq/L (ref 19–32)
Calcium: 8.9 mg/dL (ref 8.4–10.5)
Chloride: 101 mEq/L (ref 96–112)
Creatinine, Ser: 0.67 mg/dL (ref 0.40–1.20)
GFR: 94.03 mL/min (ref >60.00)
Glucose, Bld: 99 mg/dL (ref 70–99)
Potassium: 4 mEq/L (ref 3.5–5.1)
Sodium: 139 mEq/L (ref 135–145)
Total Bilirubin: 0.6 mg/dL (ref 0.2–1.2)
Total Protein: 7.2 g/dL (ref 6.0–8.3)

## 2019-05-30 LAB — LIPID PANEL
Cholesterol: 216 mg/dL — ABNORMAL HIGH (ref 0–200)
HDL: 51.9 mg/dL (ref >39.00)
LDL Cholesterol: 136 mg/dL — ABNORMAL HIGH (ref 0–99)
NonHDL: 164.26
Total CHOL/HDL Ratio: 4
Triglycerides: 143 mg/dL (ref 0.0–149.0)
VLDL: 28.6 mg/dL (ref 0.0–40.0)

## 2019-05-30 LAB — CBC WITH DIFFERENTIAL/PLATELET
Basophils Absolute: 0.1 10*3/uL (ref 0.0–0.1)
Basophils Relative: 1.2 % (ref 0.0–3.0)
Eosinophils Absolute: 0.5 10*3/uL (ref 0.0–0.7)
Eosinophils Relative: 8.3 % — ABNORMAL HIGH (ref 0.0–5.0)
HCT: 38.2 % (ref 36.0–46.0)
Hemoglobin: 12.8 g/dL (ref 12.0–15.0)
Lymphocytes Relative: 35.4 % (ref 12.0–46.0)
Lymphs Abs: 2 10*3/uL (ref 0.7–4.0)
MCHC: 33.4 g/dL (ref 30.0–36.0)
MCV: 79.6 fl (ref 78.0–100.0)
Monocytes Absolute: 0.3 10*3/uL (ref 0.1–1.0)
Monocytes Relative: 4.5 % (ref 3.0–12.0)
Neutro Abs: 2.9 10*3/uL (ref 1.4–7.7)
Neutrophils Relative %: 50.6 % (ref 43.0–77.0)
Platelets: 369 10*3/uL (ref 150.0–400.0)
RBC: 4.8 Mil/uL (ref 3.87–5.11)
RDW: 14.8 % (ref 11.5–15.5)
WBC: 5.8 10*3/uL (ref 4.0–10.5)

## 2019-05-30 LAB — SEDIMENTATION RATE: Sed Rate: 19 mm/hr (ref 0–20)

## 2019-05-30 NOTE — Progress Notes (Signed)
Nancy Barnes DOB: 1970/08/22 Encounter date: 05/30/2019  This is a 49 y.o. female who presents for complete physical   History of present illness/Additional concerns: Right when she got approval for breast surgery then hours got cut back due to COVID; lost insurance. Has been seeing chiropractor for back to help with pain.   Sees Dr. Alphonsa Gin for gyn needs Asthma: felt better with breo, but this is very expensive for her.  Sees psychiatrist regularly - Dr. Michae Kava.   Htn: hasn't been able to take the hctz because it makes her pee all the time. Has been taking losartan. Does a lot of physical work at job and urination bothers her with lifting.   Stays tired all the time. Not sure it is related to mood; wonders about it related to something else - like vitamin deficiency. Sleep at 9; up at 6. Sleeps well. Doesn't feel rested when she gets up. She does snore. Has snored for years. If she lays on back then she will sometimes wake up gasping. Was tested in past and it wasn't bad enough to require treatment.   GERD: when switched from prilosec to nexium stool has different odor to it. No change in consistency. No blood in stool. nexium works well for her.   Hair breaking, nails breaking. Notes this in last year. Skin is dry.   Uses duoneb just when sick.   Had tried amlodipine in past and did have significant swelling on low dose.   Foggy brain.   Past Medical History:  Diagnosis Date  . Anxiety   . Asthma   . Chicken pox   . Depression   . Environmental allergies    food/medication  . Headache(784.0)   . Hypertension   . Hypokalemia   . Pneumomediastinum (HCC)   . Seasonal allergies   . Suicidal ideations   . UTI (urinary tract infection)    Past Surgical History:  Procedure Laterality Date  . ABLATION    . BREAST BIOPSY  04/2011  . TUBAL LIGATION     Allergies  Allergen Reactions  . Nsaids Shortness Of Breath  . Shellfish Allergy Anaphylaxis  . Abilify  [Aripiprazole] Other (See Comments)    hallucinations  . Amlodipine   . Amoxicillin   . Fish Allergy Other (See Comments)    Reaction unknown  . Latuda [Lurasidone Hcl] Other (See Comments)    Uncontrollable shaking, hopelessness.  Barbera Setters [Levofloxacin In D5w] Other (See Comments)    Reaction unknown  . Other Other (See Comments)    Reaction unknown to nuts  . Penicillins Other (See Comments)    Reaction unknown Has patient had a PCN reaction causing immediate rash, facial/tongue/throat swelling, SOB or lightheadedness with hypotension: Yes Has patient had a PCN reaction causing severe rash involving mucus membranes or skin necrosis: No Has patient had a PCN reaction that required hospitalization: No Has patient had a PCN reaction occurring within the last 10 years: No If all of the above answers are "NO", then may proceed with Cephalosporin use.  . Prednisone Other (See Comments)    Pruritus/ hives  . Seroquel [Quetiapine Fumarate] Other (See Comments)    Patient states it made her not care about the world and she just sat down and didn't get up.  . Sulfa Antibiotics Other (See Comments)    Reaction unknown  . Ativan [Lorazepam] Anxiety    Shakiness  . Xanax [Alprazolam] Anxiety    Makes pt. anxious   Current Meds  Medication  Sig  . BREO ELLIPTA 200-25 MCG/INH AEPB Inhale 200 mcg into the lungs daily.  Marland Kitchen EPINEPHrine 0.3 mg/0.3 mL IJ SOAJ injection Inject into the muscle.  . esomeprazole (NEXIUM) 10 MG packet Take 10 mg by mouth daily before breakfast.  . hydrochlorothiazide (HYDRODIURIL) 12.5 MG tablet Take 1 tablet (12.5 mg total) by mouth daily.  . hydrOXYzine (ATARAX/VISTARIL) 10 MG tablet Take 1 tablet (10 mg total) by mouth 3 (three) times daily as needed for anxiety.  Marland Kitchen ipratropium (ATROVENT) 0.03 % nasal spray Place 0.03 sprays into the nose 2 (two) times daily. 2 sprays each nostril twice daily  . ipratropium-albuterol (DUONEB) 0.5-2.5 (3) MG/3ML SOLN Take 3 mLs by  nebulization every 6 (six) hours as needed (shortness of breath/wheezing.).  Marland Kitchen liothyronine (CYTOMEL) 5 MCG tablet Take 1 tablet (5 mcg total) by mouth daily.  Marland Kitchen losartan (COZAAR) 100 MG tablet Take 1 tablet (100 mg total) by mouth daily.  . Probiotic Product (PROBIOTIC-10 PO) Take 1 tablet by mouth daily.   Marland Kitchen venlafaxine XR (EFFEXOR-XR) 75 MG 24 hr capsule Take 3 capsules (225 mg total) by mouth daily with breakfast.  . VENTOLIN HFA 108 (90 Base) MCG/ACT inhaler INHALE 2 PUFFS BY MOUTH EVERY 6 HOURS AS NEEDED FOR WHEEZING OR SHORTNESS OF BREATH. NEEDS APPOINTMENT  . [DISCONTINUED] Magnesium 250 MG TABS Take 250 mg by mouth daily.   Social History   Tobacco Use  . Smoking status: Never Smoker  . Smokeless tobacco: Never Used  Substance Use Topics  . Alcohol use: Not Currently    Comment: socially   Family History  Problem Relation Age of Onset  . Heart attack Mother 73       Bypass x5  . Allergies Mother   . CAD Mother   . High blood pressure Mother   . High Cholesterol Mother   . Heart disease Mother   . Arthritis Mother   . Diabetes Mellitus II Father   . Hypertension Father   . Heart disease Father   . CAD Father   . Other Father        Agent orange exposure  . Arthritis Father   . Depression Father   . Allergies Daughter   . Psoriasis Daughter   . Arthritis Daughter        psoriatic  . Hypothyroidism Daughter   . Allergies Maternal Grandmother   . Atrial fibrillation Maternal Grandmother   . Stroke Maternal Grandmother   . Asthma Maternal Grandfather   . Asthma Paternal Grandmother   . Asthma Paternal Grandfather   . Suicidality Neg Hx   . Anxiety disorder Neg Hx      Review of Systems  Constitutional: Negative for activity change, appetite change, chills, fatigue, fever and unexpected weight change.  HENT: Negative for congestion, ear pain, hearing loss, sinus pressure, sinus pain, sore throat and trouble swallowing.   Eyes: Negative for pain and visual  disturbance.  Respiratory: Negative for cough, chest tightness, shortness of breath and wheezing.   Cardiovascular: Negative for chest pain, palpitations and leg swelling.  Gastrointestinal: Negative for abdominal pain, blood in stool, constipation, diarrhea, nausea and vomiting.  Genitourinary: Positive for frequency (with taking hctz). Negative for difficulty urinating and menstrual problem.  Musculoskeletal: Positive for back pain (upper) and neck pain. Negative for arthralgias.       Neck and back pain secondary to large breast size.  Skin: Negative for rash.  Neurological: Negative for dizziness, weakness, numbness and headaches.  Hematological: Negative for  adenopathy. Does not bruise/bleed easily.  Psychiatric/Behavioral: Negative for sleep disturbance and suicidal ideas. The patient is not nervous/anxious.        Mood has been really stable.    CBC:  Lab Results  Component Value Date   WBC 5.8 05/30/2019   HGB 12.8 05/30/2019   HCT 38.2 05/30/2019   MCH 30.2 03/19/2013   MCHC 33.4 05/30/2019   RDW 14.8 05/30/2019   PLT 369.0 05/30/2019   CMP: Lab Results  Component Value Date   NA 139 05/30/2019   K 4.0 05/30/2019   CL 101 05/30/2019   CO2 31 05/30/2019   GLUCOSE 99 05/30/2019   BUN 7 05/30/2019   CREATININE 0.67 05/30/2019   GFRAA >90 03/19/2013   CALCIUM 8.9 05/30/2019   PROT 7.2 05/30/2019   BILITOT 0.6 05/30/2019   ALKPHOS 124 (H) 05/30/2019   ALT 14 05/30/2019   AST 13 05/30/2019   LIPID: Lab Results  Component Value Date   CHOL 216 (H) 05/30/2019   TRIG 143.0 05/30/2019   HDL 51.90 05/30/2019   LDLCALC 136 (H) 05/30/2019    Objective:  BP (!) 150/110 (BP Location: Left Arm, Patient Position: Sitting, Cuff Size: Normal)   Pulse 73   Temp 97.7 F (36.5 C) (Temporal)   Ht 5' 6.5" (1.689 m)   Wt 176 lb 12.8 oz (80.2 kg)   BMI 28.11 kg/m   Weight: 176 lb 12.8 oz (80.2 kg)   BP Readings from Last 3 Encounters:  05/30/19 (!) 150/110  04/29/18  (!) 162/101  03/19/18 (!) 150/90   Wt Readings from Last 3 Encounters:  05/30/19 176 lb 12.8 oz (80.2 kg)  04/29/18 183 lb (83 kg)  03/19/18 183 lb 3.2 oz (83.1 kg)    Physical Exam Constitutional:      General: She is not in acute distress.    Appearance: She is well-developed.  HENT:     Head: Normocephalic and atraumatic.     Right Ear: External ear normal.     Left Ear: External ear normal.     Mouth/Throat:     Pharynx: No oropharyngeal exudate.  Eyes:     Conjunctiva/sclera: Conjunctivae normal.     Pupils: Pupils are equal, round, and reactive to light.  Neck:     Thyroid: No thyromegaly.  Cardiovascular:     Rate and Rhythm: Normal rate and regular rhythm.     Heart sounds: Normal heart sounds. No murmur. No friction rub. No gallop.   Pulmonary:     Effort: Pulmonary effort is normal.     Breath sounds: Normal breath sounds.  Abdominal:     General: Bowel sounds are normal. There is no distension.     Palpations: Abdomen is soft. There is no mass.     Tenderness: There is no abdominal tenderness. There is no guarding.     Hernia: No hernia is present.  Musculoskeletal:        General: No tenderness or deformity. Normal range of motion.     Cervical back: Normal range of motion and neck supple.  Lymphadenopathy:     Cervical: No cervical adenopathy.  Skin:    General: Skin is warm and dry.     Findings: No rash.     Comments: Deep indentations of her bra straps are bilateral shoulders  Neurological:     Mental Status: She is alert and oriented to person, place, and time.     Deep Tendon Reflexes: Reflexes normal.  Reflex Scores:      Tricep reflexes are 2+ on the right side and 2+ on the left side.      Bicep reflexes are 2+ on the right side and 2+ on the left side.      Brachioradialis reflexes are 2+ on the right side and 2+ on the left side.      Patellar reflexes are 2+ on the right side and 2+ on the left side. Psychiatric:        Speech: Speech  normal.        Behavior: Behavior normal.        Thought Content: Thought content normal.     Assessment/Plan: Health Maintenance Due  Topic Date Due  . HIV Screening  Never done  . PAP SMEAR-Modifier  Never done   Health Maintenance reviewed.  1. Preventative health care She is up-to-date with preventative health measures.  2. Large breasts I am resending referral since insurance has changed.  I do think that breast reduction would be helpful for her in terms of overall neck pain, shoulder pain, weight loss. - Ambulatory referral to Plastic Surgery  3. Iron deficiency Recheck blood work  4. Fatigue, unspecified type Start with blood work.  Further evaluation pending results. - CBC with Differential/Platelet; Future - Comprehensive metabolic panel; Future - TSH; Future - Vitamin B12; Future - VITAMIN D 25 Hydroxy (Vit-D Deficiency, Fractures); Future - IBC + Ferritin; Future - ANA; Future - Sedimentation rate; Future - Sedimentation rate - ANA - IBC + Ferritin - VITAMIN D 25 Hydroxy (Vit-D Deficiency, Fractures) - Vitamin B12 - TSH - Comprehensive metabolic panel - CBC with Differential/Platelet  5. Asthma, intrinsic She is going to call to see if she can qualify for patient assistance, but if not she will let me know.  When I try to search formulary through my system, it appears that there are no, LABA/inhaled corticosteroids that are covered under her plan.  6. Dysthymia She is on levothyroxine for this.  7. Lipid screening - Lipid panel; Future - Lipid panel  8. Snoring We discussed risks of sleep apnea.  She has had sleep study in the past, and did not have significant enough apneas to qualify for machine, but I think with ongoing fatigue and snoring it is worthwhile to recheck. - Ambulatory referral to Sleep Studies  9.  Hypertension Advised trying to take the hydrochlorothiazide in the evening to see if this helps with blood pressure.  She will report  numbers back to me.  She is unable to take it during the day since it makes her have to go to the bathroom and she has chronic pelvic floor weakness and thus incontinence with heavy lifting.  We also discussed doing Kegel's every time she is using the restroom to work on some strengthening.  Return for pending bloodwork.  Micheline Rough, MD

## 2019-05-30 NOTE — Patient Instructions (Signed)
Try taking hydrochlorothiazide right after work. Let me know if this helps or not.

## 2019-05-31 LAB — IBC + FERRITIN
Ferritin: 11.3 ng/mL (ref 10.0–291.0)
Iron: 63 ug/dL (ref 42–145)
Saturation Ratios: 14.2 % — ABNORMAL LOW (ref 20.0–50.0)
Transferrin: 316 mg/dL (ref 212.0–360.0)

## 2019-05-31 LAB — VITAMIN B12: Vitamin B-12: 261 pg/mL (ref 211–911)

## 2019-05-31 LAB — TSH: TSH: 1.55 u[IU]/mL (ref 0.35–4.50)

## 2019-05-31 LAB — VITAMIN D 25 HYDROXY (VIT D DEFICIENCY, FRACTURES): VITD: 22.16 ng/mL — ABNORMAL LOW (ref 30.00–100.00)

## 2019-06-01 ENCOUNTER — Other Ambulatory Visit: Payer: Self-pay | Admitting: Family Medicine

## 2019-06-01 DIAGNOSIS — J4540 Moderate persistent asthma, uncomplicated: Secondary | ICD-10-CM

## 2019-06-01 MED ORDER — VENTOLIN HFA 108 (90 BASE) MCG/ACT IN AERS: 2.0000 | INHALATION_SPRAY | Freq: Four times a day (QID) | RESPIRATORY_TRACT | 3 refills | Status: DC | PRN

## 2019-06-02 LAB — ANA: Anti Nuclear Antibody (ANA): NEGATIVE

## 2019-06-05 ENCOUNTER — Encounter: Payer: Self-pay | Admitting: Family Medicine

## 2019-06-05 DIAGNOSIS — I10 Essential (primary) hypertension: Secondary | ICD-10-CM

## 2019-06-06 MED ORDER — LOSARTAN POTASSIUM 100 MG PO TABS: 100.0000 mg | ORAL_TABLET | Freq: Every day | ORAL | 1 refills | Status: DC

## 2019-06-17 ENCOUNTER — Encounter: Payer: Self-pay | Admitting: Internal Medicine

## 2019-06-17 ENCOUNTER — Other Ambulatory Visit: Payer: Self-pay

## 2019-06-17 ENCOUNTER — Ambulatory Visit (INDEPENDENT_AMBULATORY_CARE_PROVIDER_SITE_OTHER): Payer: 59 | Admitting: Internal Medicine

## 2019-06-17 VITALS — BP 138/78 | HR 79 | Temp 98.4°F | Ht 64.5 in | Wt 180.0 lb

## 2019-06-17 DIAGNOSIS — J45909 Unspecified asthma, uncomplicated: Secondary | ICD-10-CM | POA: Diagnosis not present

## 2019-06-17 DIAGNOSIS — R0683 Snoring: Secondary | ICD-10-CM | POA: Diagnosis not present

## 2019-06-17 DIAGNOSIS — G4733 Obstructive sleep apnea (adult) (pediatric): Secondary | ICD-10-CM | POA: Insufficient documentation

## 2019-06-17 MED ORDER — BUDESONIDE-FORMOTEROL FUMARATE 160-4.5 MCG/ACT IN AERO
INHALATION_SPRAY | RESPIRATORY_TRACT | 6 refills | Status: DC
Start: 2019-06-17 — End: 2019-09-02

## 2019-06-17 NOTE — Patient Instructions (Signed)
Script printed for Symbicort inhaler    You can price check this and compare how it works to your experience with [employer].  Order- schedule Home Sleep Test  Please call us about 2 weeks after your sleep test to see if results and recommendations are ready yet. If appropriate, we may be able to start treatment before we see you next.

## 2019-06-17 NOTE — Progress Notes (Signed)
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 Medications (nonsteroidal anti-inflammatory drugs, Toradol) caused facial caused facial swelling/angioedema. Foods-pecan, walnut caused itchiness and sores in mouth.             Shrimp, crab caused chest tightness 3 years ago. Seasonal allergic rhinitis- pollens and cats. Latex-rash Allergy Skin Test- 05/19/12- POS especially grass, tree, dust mite, cockroach. She is going to try Singulair, Dymista, Allegra.  ----------------------------------------------------------------------------------  10//3/14- 42 yoF never smoker referred courtesy of Dr Gwenette Greet for allergy evaluation. 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  Tried Flovent alone due to cost, but clearly did better on Advair- wants to go back to that.  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 Now comes for sleep evaluation courtesy of Dr Ethlyn Gallery..  Medical problem list includes  Asthma, Allergic Rhinitis, Food Allergy,  Anxiety/ Depression,  NPSG 10/24/13- AHI 3.5/ hr, desaturation to 93%, body weight 176 lbs (ordered by Dr Redmond Baseman) Had 2 Moderna Covax Epworth score 13 Body weight today 180 lbs Snores very loudly per boyfriend. If sleeps on back will wake gasping. Reports daytime tiredness, asleep is sits quietly, but denies problem driving. Aware of waking at night in uncomfortable positions because can't breathe lying on back. No sleep med. Little caffeine. ENT surgery- none. Wakes occ aware of wheezing, postnasal drip. Breo worked very well but too expensive- didn't bring her paperwork for manufacturer assistance. Doesn't remember  Symbicort.  Saw allergist once at [facility].  Pending evaluation for reduction mammoplasty.   Prior to Admission medications   Medication Sig Start Date End Date Taking? Authorizing Provider  Ascorbic Acid (VITAMIN C) 100 MG tablet Take 100 mg by mouth daily.   Yes [provider]  b complex vitamins capsule Take 1 capsule by mouth daily.   Yes [provider]  cetirizine (ZYRTEC) 10 MG chewable tablet Chew 10 mg by mouth daily.   Yes [provider]  EPINEPHrine 0.3 mg/0.3 mL IJ SOAJ injection Inject into the muscle. 03/15/18  Yes [provider]  esomeprazole (NEXIUM) 10 MG packet Take 10 mg by mouth daily before breakfast.   Yes [provider]  ferrous sulfate 325 (65 FE) MG tablet Take 325 mg by mouth daily with breakfast.   Yes [provider]  hydrochlorothiazide (HYDRODIURIL) 12.5 MG tablet Take 1 tablet (12.5 mg total) by mouth daily. 05/14/18  Yes Koberlein, Steele Berg, MD  hydrOXYzine (ATARAX/VISTARIL) 10 MG tablet Take 1 tablet (10 mg total) by mouth 3 (three) times daily as needed for anxiety. 05/19/19  Yes Charlcie Cradle, MD  ipratropium (ATROVENT) 0.03 % nasal spray Place 0.03 sprays into the nose 2 (two) times daily. 2 sprays each nostril twice daily 03/15/18  Yes [provider]  ipratropium-albuterol (DUONEB) 0.5-2.5 (3) MG/3ML SOLN Take 3 mLs by nebulization every 6 (six) hours as needed (shortness of breath/wheezing.).   Yes [provider]  liothyronine (CYTOMEL) 5 MCG tablet Take 1 tablet (5 mcg total) by mouth daily. 05/19/19  Yes Charlcie Cradle, MD  losartan (COZAAR) 100 MG tablet Take 1 tablet (100 mg total) by mouth daily. 06/06/19  Yes Koberlein, Steele Berg, MD  Probiotic Product (PROBIOTIC-10 PO) Take 1 tablet by mouth  daily.    Yes [provider]  venlafaxine XR (EFFEXOR-XR) 75 MG 24 hr capsule Take 3 capsules (225 mg total) by mouth daily with breakfast. 05/19/19  Yes [name], MD  VENTOLIN HFA  108 [phone] Base) MCG/ACT inhaler Inhale 2 puffs into the lungs every 6 (six) hours as needed. INHALE 2 PUFFS BY MOUTH EVERY 6 HOURS AS NEEDED FOR WHEEZING OR SHORTNESS OF BREATH 06/01/19  Yes Koberlein, Junell C, MD  BREO ELLIPTA 200-25 MCG/INH AEPB Inhale 200 mcg into the lungs daily. 03/16/18   [provider]  budesonide-formoterol (SYMBICORT) 160-4.5 MCG/ACT inhaler Inhale 2 puffs then rinse mouth, twice daily 06/17/19   Waymon Budge, MD   Past Medical History:  Diagnosis Date  . Anxiety   . Asthma   . Chicken pox   . Depression   . Environmental allergies    food/medication  . Headache(784.0)   . Hypertension   . Hypokalemia   . Pneumomediastinum (HCC)   . Seasonal allergies   . Suicidal ideations   . UTI (urinary tract infection)    Past Surgical History:  Procedure Laterality Date  . ABLATION    . BREAST BIOPSY  04/2011  . TUBAL LIGATION     Family History  Problem Relation Age of Onset  . Heart attack Mother 34       Bypass x5  . Allergies Mother   . CAD Mother   . High blood pressure Mother   . High Cholesterol Mother   . Heart disease Mother   . Arthritis Mother   . Diabetes Mellitus II Father   . Hypertension Father   . Heart disease Father   . CAD Father   . Other Father        Agent orange exposure  . Arthritis Father   . Depression Father   . Allergies Daughter   . Psoriasis Daughter   . Arthritis Daughter        psoriatic  . Hypothyroidism Daughter   . Allergies Maternal Grandmother   . Atrial fibrillation Maternal Grandmother   . Stroke Maternal Grandmother   . Asthma Maternal Grandfather   . Asthma Paternal Grandmother   . Asthma Paternal Grandfather   . Suicidality Neg Hx   . Anxiety disorder Neg Hx    Social History   Socioeconomic History  . Marital status: Divorced    Spouse name: Not on file  . Number of children: Not on file  . Years of education: Not on file  . Highest education level: Not on file  Occupational History  .  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 Occupation: Engineer, water  Tobacco Use  . Smoking status: Never Smoker  . Smokeless tobacco: Never Used  Substance and Sexual Activity  . Alcohol use: Not Currently    Comment: socially  . Drug use: No  . Sexual activity: Yes    Partners: Male    Birth control/protection: None, Condom  Other Topics Concern  . Not on file  Social History Narrative   Works for [employer]. Lives at home with 2 teenage children. Never smoker.   Social Determinants of Health   Financial Resource Strain:   . Difficulty of Paying Living Expenses:   Food Insecurity:   . Worried About Programme researcher, broadcasting/film/video in the Last Year:   . Barista in the Last Year:   [employer]  Needs:   . Lack of Transportation (Medical):   Marland Kitchen Lack of Transportation (Non-Medical):   Physical Activity:   . Days of Exercise per Week:   . Minutes of Exercise per Session:   Stress:   . Feeling of Stress :   Social Connections:   . Frequency of Communication with Friends and Family:   . Frequency of Social Gatherings with Friends and Family:   . Attends Religious Services:   . Active Member of Clubs or Organizations:   . Attends Banker Meetings:   Marland Kitchen Marital Status:   Intimate Partner Violence:   . Fear of Current or Ex-Partner:   . Emotionally Abused:   Marland Kitchen Physically Abused:   . Sexually Abused:      ROS-see HPI   + = positive Constitutional:   No-   weight loss, night sweats, fevers, chills, fatigue, lassitude. HEENT:   No-  headaches, difficulty swallowing, tooth/dental problems, sore throat,      +sneezing, itching, ear ache, +nasal congestion, post nasal drip,  CV:  No-   chest pain, orthopnea, PND, swelling in lower extremities, anasarca, dizziness, +palpitations Resp: + shortness of breath with exertion or at rest.             + productive cough,  No  non-productive cough,  No- coughing up of blood.              No-   change in color of mucus.  + wheezing.   Skin: No-   rash or lesions. GI:  + heartburn, indigestion, abdominal pain, nausea, vomiting,  GU: . MS:  No-   joint pain or swelling.   Neuro-     nothing unusual Psych:  No- change in mood or affect. + depression or +anxiety.  No memory loss.  OBJ- Physical Exam General- Alert, Oriented, Affect-appropriate, Distress- none acute Skin- rash-none, lesions- none, excoriation- none Lymphadenopathy- none Head- atraumatic            Eyes- Gross vision intact, PERRLA, conjunctivae and secretions clear            Ears- Hearing, canals-normal            Nose- clear, no-Septal dev, mucus, polyps, erosion, perforation             Throat- Mallampati II , mucosa clear , drainage- none, tonsils- atrophic Neck- flexible , trachea midline, no stridor , thyroid nl, carotid no bruit Chest - symmetrical excursion , unlabored           Heart/CV- RRR , no murmur , no gallop  , no rub, nl s1 s2                           - JVD- none , edema- none, stasis changes- none, varices- none           Lung- clear to P&A, wheeze- none, cough+dry , dullness-none, rub- none           Chest wall-  Abd-  Br/ Gen/ Rectal- Not done, not indicated Extrem- cyanosis- none, clubbing, none, atrophy- none, strength- nl Neuro- grossly intact to observation

## 2019-06-17 NOTE — Assessment & Plan Note (Signed)
Breo worked well but cost too much. She has application forms for assistance. Meanwhile try Symbicort. Education done.

## 2019-06-17 NOTE — Assessment & Plan Note (Addendum)
Loud snoring, gasping, daytime somnolence. High probability for clinical OSA now Plan- home sleep test

## 2019-06-24 ENCOUNTER — Other Ambulatory Visit: Payer: Self-pay

## 2019-06-24 ENCOUNTER — Ambulatory Visit (INDEPENDENT_AMBULATORY_CARE_PROVIDER_SITE_OTHER): Payer: 59 | Admitting: Plastic Surgery

## 2019-06-24 ENCOUNTER — Encounter: Payer: Self-pay | Admitting: Plastic Surgery

## 2019-06-24 VITALS — BP 166/122 | HR 90 | Temp 97.3°F | Ht 63.0 in

## 2019-06-24 DIAGNOSIS — N62 Hypertrophy of breast: Secondary | ICD-10-CM | POA: Diagnosis not present

## 2019-06-24 DIAGNOSIS — M546 Pain in thoracic spine: Secondary | ICD-10-CM

## 2019-06-24 DIAGNOSIS — M542 Cervicalgia: Secondary | ICD-10-CM | POA: Diagnosis not present

## 2019-06-24 DIAGNOSIS — G8929 Other chronic pain: Secondary | ICD-10-CM

## 2019-06-24 NOTE — Progress Notes (Signed)
Patient ID: Nancy Barnes, female    DOB: [birth year], 49 y.o.   MRN: [id number]   Chief Complaint  Patient presents with  . Follow-up  . Breast Problem    Mammary Hyperplasia: The patient is a 49 y.o. female with a history of mammary hyperplasia for several years.  She has extremely large breasts causing symptoms that include the following: Back pain in the upper and lower back, including neck pain. She pulls or pins her bra straps to provide better lift and relief of the pressure and pain. She notices relief by holding her breast up manually.  Her shoulder straps cause grooves and pain and pressure that requires padding for relief. Pain medication is sometimes required with motrin and tylenol.  Activities that are hindered by enlarged breasts include: exercise and running.  Her breasts are extremely large and fairly symmetric.  She has hyperpigmentation of the inframammary area on both sides.  The sternal to nipple distance on the right is 28 cm and the left is 29 cm.  The IMF distance is 19 cm.  She is 5 feet 3 inches tall and weighs 180 pounds.  Preoperative bra size = 38 G cup.  She would like to be a B or a C cup the estimated excess breast tissue to be removed at the time of surgery = 480 grams on the left and 480 grams on the right.  Mammogram history: She will need to have a mammogram done.  She does not have a family history of breast cancer.  She is not a smoker and does not have diabetes she has some depression and hyperlipidemia she does see a chiropractor but has not been seen by physical therapy.  She had a adenoma removed 2010 from her breast.  No other surgery.   Review of Systems  Constitutional: Positive for activity change. Negative for appetite change.  Eyes: Negative.   Respiratory: Negative.  Negative for chest tightness and shortness of breath.   Cardiovascular: Negative.   Gastrointestinal: Negative for abdominal distention and abdominal pain.  Endocrine:  Negative.   Genitourinary: Negative.   Musculoskeletal: Positive for back pain and neck pain.  Neurological: Negative.   Hematological: Negative.   Psychiatric/Behavioral: Negative.     Past Medical History:  Diagnosis Date  . Anxiety   . Asthma   . Chicken pox   . Depression   . Environmental allergies    food/medication  . Headache(784.0)   . Hypertension   . Hypokalemia   . Pneumomediastinum ([place])   . Seasonal allergies   . Suicidal ideations   . UTI (urinary tract infection)     Past Surgical History:  Procedure Laterality Date  . ABLATION    . BREAST BIOPSY  04/2011  . TUBAL LIGATION        Current Outpatient Medications:  .  Ascorbic Acid (VITAMIN C) 100 MG tablet, Take 100 mg by mouth daily., Disp: , Rfl:  .  b complex vitamins capsule, Take 1 capsule by mouth daily., Disp: , Rfl:  .  BREO ELLIPTA 200-25 MCG/INH AEPB, Inhale 200 mcg into the lungs daily., Disp: , Rfl:  .  budesonide-formoterol (SYMBICORT) 160-4.5 MCG/ACT inhaler, Inhale 2 puffs then rinse mouth, twice daily, Disp: 1 Inhaler, Rfl: 6 .  cetirizine (ZYRTEC) 10 MG chewable tablet, Chew 10 mg by mouth daily., Disp: , Rfl:  .  EPINEPHrine 0.3 mg/0.3 mL IJ SOAJ injection, Inject into the muscle., Disp: , Rfl:  .  esomeprazole (  NEXIUM) 10 MG packet, Take 10 mg by mouth daily before breakfast., Disp: , Rfl:  .  ferrous sulfate 325 (65 FE) MG tablet, Take 325 mg by mouth daily with breakfast., Disp: , Rfl:  .  hydrochlorothiazide (HYDRODIURIL) 12.5 MG tablet, Take 1 tablet (12.5 mg total) by mouth daily., Disp: 90 tablet, Rfl: 1 .  hydrOXYzine (ATARAX/VISTARIL) 10 MG tablet, Take 1 tablet (10 mg total) by mouth 3 (three) times daily as needed for anxiety., Disp: 270 tablet, Rfl: 0 .  ipratropium (ATROVENT) 0.03 % nasal spray, Place 0.03 sprays into the nose 2 (two) times daily. 2 sprays each nostril twice daily, Disp: , Rfl:  .  ipratropium-albuterol (DUONEB) 0.5-2.5 (3) MG/3ML SOLN, Take 3 mLs by nebulization  every 6 (six) hours as needed (shortness of breath/wheezing.)., Disp: , Rfl:  .  liothyronine (CYTOMEL) 5 MCG tablet, Take 1 tablet (5 mcg total) by mouth daily., Disp: 90 tablet, Rfl: 0 .  losartan (COZAAR) 100 MG tablet, Take 1 tablet (100 mg total) by mouth daily., Disp: 90 tablet, Rfl: 1 .  Probiotic Product (PROBIOTIC-10 PO), Take 1 tablet by mouth daily. , Disp: , Rfl:  .  venlafaxine XR (EFFEXOR-XR) 75 MG 24 hr capsule, Take 3 capsules (225 mg total) by mouth daily with breakfast., Disp: 270 capsule, Rfl: 0 .  VENTOLIN HFA 108 (90 Base) MCG/ACT inhaler, Inhale 2 puffs into the lungs every 6 (six) hours as needed. INHALE 2 PUFFS BY MOUTH EVERY 6 HOURS AS NEEDED FOR WHEEZING OR SHORTNESS OF BREATH, Disp: 18 g, Rfl: 3   Objective:   Vitals:   06/24/19 1309  Pulse: 90  Temp: (!) 97.3 F (36.3 C)  SpO2: 99%    Physical Exam Vitals and nursing note reviewed.  Constitutional:      General: She is in acute distress.     Appearance: Normal appearance.  HENT:     Head: Normocephalic and atraumatic.  Cardiovascular:     Rate and Rhythm: Normal rate.     Pulses: Normal pulses.  Pulmonary:     Effort: Pulmonary effort is normal. No respiratory distress.  Abdominal:     General: Abdomen is flat. There is no distension.     Tenderness: There is no abdominal tenderness.  Skin:    General: Skin is warm.     Capillary Refill: Capillary refill takes less than 2 seconds.  Neurological:     General: No focal deficit present.     Mental Status: She is alert and oriented to person, place, and time.  Psychiatric:        Mood and Affect: Mood normal.        Behavior: Behavior normal.        Thought Content: Thought content normal.     Assessment & Plan:  Symptomatic mammary hypertrophy  Neck pain  Chronic bilateral thoracic back pain The patient is a good candidate for bilateral breast reduction with possible liposuction.  We discussed some of the risks and complications including  change in nipple areola sensation and inability to breast-feed in the future.  The patient states this is not a problem. Pictures were obtained of the patient and placed in the chart with the patient's or guardian's permission.  Pictures were obtained of the patient and placed in the chart with the patient's or guardian's permission.  We will send her for mammogram and physical therapy consult.  We will also get a release of information from her chiropractor.  Alena Bills Pranathi Winfree, DO

## 2019-06-24 NOTE — Addendum Note (Signed)
Addended by: Verdie Shire on: 06/24/2019 03:18 PM   Modules accepted: Orders

## 2019-07-03 ENCOUNTER — Encounter: Payer: Self-pay | Admitting: Family Medicine

## 2019-07-11 DIAGNOSIS — N62 Hypertrophy of breast: Secondary | ICD-10-CM

## 2019-07-19 ENCOUNTER — Other Ambulatory Visit: Payer: Self-pay

## 2019-07-19 ENCOUNTER — Ambulatory Visit: Payer: 59

## 2019-07-19 DIAGNOSIS — G4733 Obstructive sleep apnea (adult) (pediatric): Secondary | ICD-10-CM

## 2019-07-19 DIAGNOSIS — R0683 Snoring: Secondary | ICD-10-CM

## 2019-07-22 ENCOUNTER — Other Ambulatory Visit: Payer: Self-pay

## 2019-07-22 ENCOUNTER — Ambulatory Visit
Admission: RE | Admit: 2019-07-22 | Discharge: 2019-07-22 | Disposition: A | Discharge status: Home / Self Care | Payer: 59 | Source: Ambulatory Visit | Attending: Plastic Surgery | Admitting: Plastic Surgery

## 2019-07-22 ENCOUNTER — Ambulatory Visit: Payer: 59 | Admitting: Physical Therapy

## 2019-07-22 ENCOUNTER — Emergency Department (HOSPITAL_COMMUNITY): Admission: EM | Admit: 2019-07-22 | Discharge: 2019-07-22 | Discharge status: Against Medical Advice | Payer: 59

## 2019-07-22 DIAGNOSIS — N62 Hypertrophy of breast: Secondary | ICD-10-CM

## 2019-07-23 ENCOUNTER — Ambulatory Visit
Admission: EM | Admit: 2019-07-23 | Discharge: 2019-07-23 | Disposition: A | Discharge status: Home / Self Care | Payer: 59 | Attending: Emergency Medicine | Admitting: Emergency Medicine

## 2019-07-23 ENCOUNTER — Other Ambulatory Visit: Payer: Self-pay

## 2019-07-23 ENCOUNTER — Encounter: Payer: Self-pay | Admitting: Emergency Medicine

## 2019-07-23 DIAGNOSIS — L989 Disorder of the skin and subcutaneous tissue, unspecified: Secondary | ICD-10-CM | POA: Diagnosis not present

## 2019-07-23 DIAGNOSIS — L089 Local infection of the skin and subcutaneous tissue, unspecified: Secondary | ICD-10-CM

## 2019-07-23 MED ORDER — DOXYCYCLINE HYCLATE 100 MG PO CAPS
100.0000 mg | ORAL_CAPSULE | Freq: Two times a day (BID) | ORAL | 0 refills | Status: DC
Start: 2019-07-23 — End: 2019-08-26

## 2019-07-23 NOTE — ED Provider Notes (Signed)
[facility]   [id number] 07/23/19 Arrival Time: 1227   CC: Wound check  SUBJECTIVE:  Nancy Barnes is a 49 y.o. female who presents for wound check.  Had metal cart hit LLE in May.  Has wound to LLE.  Concerned for infection over the past few day.  Reports pain, redness, drainage and swelling.  Has been using neosporin with minimal relief.  Denies fever, chills, nausea, vomiting.    ROS: As per HPI.  All other pertinent ROS negative.     Past Medical History:  Diagnosis Date   Anxiety    Asthma    Chicken pox    Depression    Environmental allergies    food/medication   Headache(784.0)    Hypertension    Hypokalemia    Pneumomediastinum ([facility])    Seasonal allergies    Suicidal ideations    UTI (urinary tract infection)    Past Surgical History:  Procedure Laterality Date   ABLATION     BREAST BIOPSY  04/2011   TUBAL LIGATION     Allergies  Allergen Reactions   Nsaids Shortness Of Breath   Shellfish Allergy Anaphylaxis   Abilify [Aripiprazole] Other (See Comments)    hallucinations   Amlodipine    Amoxicillin    Fish Allergy Other (See Comments)    Reaction unknown   Latuda [Lurasidone Hcl] Other (See Comments)    Uncontrollable shaking, hopelessness.   Levaquin [Levofloxacin In D5w] Other (See Comments)    Reaction unknown   Other Other (See Comments)    Reaction unknown to nuts   Penicillins Other (See Comments)    Reaction unknown Has patient had a PCN reaction causing immediate rash, facial/tongue/throat swelling, SOB or lightheadedness with hypotension: Yes Has patient had a PCN reaction causing severe rash involving mucus membranes or skin necrosis: No Has patient had a PCN reaction that required hospitalization: No Has patient had a PCN reaction occurring within the last 10 years: No If all of the above answers are "NO", then may proceed with Cephalosporin use.   Prednisone Other (See Comments)    Pruritus/  hives   Seroquel [Quetiapine Fumarate] Other (See Comments)    Patient states it made her not care about the world and she just sat down and didn't get up.   Sulfa Antibiotics Other (See Comments)    Reaction unknown   Ativan [Lorazepam] Anxiety    Shakiness   Xanax [Alprazolam] Anxiety    Makes pt. anxious   No current facility-administered medications on file prior to encounter.   Current Outpatient Medications on File Prior to Encounter  Medication Sig Dispense Refill   Ascorbic Acid (VITAMIN C) 100 MG tablet Take 100 mg by mouth daily.     b complex vitamins capsule Take 1 capsule by mouth daily.     BREO ELLIPTA 200-25 MCG/INH AEPB Inhale 200 mcg into the lungs daily.     budesonide-formoterol (SYMBICORT) 160-4.5 MCG/ACT inhaler Inhale 2 puffs then rinse mouth, twice daily 1 Inhaler 6   cetirizine (ZYRTEC) 10 MG chewable tablet Chew 10 mg by mouth daily.     EPINEPHrine 0.3 mg/0.3 mL IJ SOAJ injection Inject into the muscle.     ferrous sulfate 325 (65 FE) MG tablet Take 325 mg by mouth daily with breakfast.     hydrochlorothiazide (HYDRODIURIL) 12.5 MG tablet Take 1 tablet (12.5 mg total) by mouth daily. 90 tablet 1   hydrOXYzine (ATARAX/VISTARIL) 10 MG tablet Take 1 tablet (10 mg total) by  mouth 3 (three) times daily as needed for anxiety. 270 tablet 0   ipratropium (ATROVENT) 0.03 % nasal spray Place 0.03 sprays into the nose 2 (two) times daily. 2 sprays each nostril twice daily     ipratropium-albuterol (DUONEB) 0.5-2.5 (3) MG/3ML SOLN Take 3 mLs by nebulization every 6 (six) hours as needed (shortness of breath/wheezing.).     liothyronine (CYTOMEL) 5 MCG tablet Take 1 tablet (5 mcg total) by mouth daily. 90 tablet 0   losartan (COZAAR) 100 MG tablet Take 1 tablet (100 mg total) by mouth daily. 90 tablet 1   omeprazole (PRILOSEC) 10 MG capsule Take 10 mg by mouth daily.     Probiotic Product (PROBIOTIC-10 PO) Take 1 tablet by mouth daily.      venlafaxine  XR (EFFEXOR-XR) 75 MG 24 hr capsule Take 3 capsules (225 mg total) by mouth daily with breakfast. 270 capsule 0   VENTOLIN HFA 108 (90 Base) MCG/ACT inhaler Inhale 2 puffs into the lungs every 6 (six) hours as needed. 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 Lives at home with 2 teenage children. Never smoker.   Social Determinants of Health   Financial Resource Strain:    Difficulty of Paying Living Expenses:   Food Insecurity:    Worried About Programme researcher, broadcasting/film/video in the Last Year:    Barista in the Last Year:   Transportation Needs:    Freight forwarder (Medical):    Lack of Transportation (Non-Medical):   Physical Activity:    Days of Exercise per Week:    Minutes of Exercise per Session:   Stress:    Feeling of Stress :   Social Connections:    Frequency of Communication with Friends and Family:    Frequency of Social Gatherings with Friends and Family:    Attends Religious Services:    Active Member of Clubs or Organizations:    Attends Engineer, structural:    Marital Status:   Intimate Partner Violence:      Fear of Current or Ex-Partner:    Emotionally Abused:    Physically Abused:    Sexually Abused:    Family History  Problem Relation Age of Onset   Heart attack Mother 87       Bypass x5   Allergies Mother    CAD Mother    High blood pressure Mother    High Cholesterol Mother    Heart disease Mother    Arthritis Mother    Diabetes  Mellitus II Father    Hypertension Father    Heart disease Father    CAD Father    Other Father        Agent orange exposure   Arthritis Father    Depression Father    Allergies Daughter    Psoriasis Daughter    Arthritis Daughter        psoriatic   Hypothyroidism Daughter    Allergies Maternal Grandmother    Atrial fibrillation Maternal Grandmother    Stroke Maternal Grandmother    Asthma Maternal Grandfather    Asthma Paternal Grandmother    Asthma Paternal Grandfather    Suicidality Neg Hx    Anxiety disorder Neg Hx     OBJECTIVE:  Vitals:   07/23/19 1318 07/23/19 1326  BP: (!) 144/106   Pulse: 85   Resp: 17   Temp: 98.6 F (37 C)   TempSrc: Oral   SpO2: 95%   Weight:  171 lb (77.6 kg)     General appearance: alert; no distress CV: Posterior tibialis pulse 2+ Skin: 2 cm area of erythema with scab formation in the center to anterior LLE, mildly TTP, no obvious drainage or bleeding Psychological: alert and cooperative; normal mood and affect  ASSESSMENT & PLAN:  1. Sore on leg   2. Wound infection     Meds ordered this encounter  Medications   doxycycline (VIBRAMYCIN) 100 MG capsule    Sig: Take 1 capsule (100 mg total) by mouth 2 (two) times daily.    Dispense:  20 capsule    Refill:  0    Order Specific Question:   Supervising Provider    Answer:   Eustace Moore [[id number]]   Prescribed doxycycline take as directed and to completion Continue to alternate ibuprofen and tylenol as needed for pain and fever Follow up with PCP if symptoms persists Return or go to the ED if you have  any new or worsening symptoms such as increased pain, redness, swelling, discharge, high fever, night sweats, abdominal pain, etc...    Reviewed expectations re: course of current medical issues. Questions answered. Outlined signs and symptoms indicating need for more acute intervention. Patient verbalized understanding. After Visit Summary given.          Rennis Harding, PA-C 07/23/19 1353

## 2019-07-23 NOTE — ED Triage Notes (Signed)
In may tripped over something metal cart, sore has been on left lower leg since then. Has been cleaning it, and neosporin.

## 2019-07-23 NOTE — Discharge Instructions (Addendum)
Prescribed doxycycline take as directed and to completion Continue to alternate ibuprofen and tylenol as needed for pain and fever Follow up with PCP if symptoms persists Return or go to the ED if you have any new or worsening symptoms such as increased pain, redness, swelling, discharge, high fever, night sweats, abdominal pain, etc...  

## 2019-07-25 DIAGNOSIS — G4733 Obstructive sleep apnea (adult) (pediatric): Secondary | ICD-10-CM

## 2019-07-25 NOTE — Telephone Encounter (Signed)
Dr. Young, Please see patient comment and advise.  Thank you. 

## 2019-07-25 NOTE — Telephone Encounter (Signed)
The home sleep test showed moderate obstructive sleep apnea, averaging 21 apneas/ hour, with drops in blood oxygen level.  I recommend we order new DME, new CPAP auto 5-15, mask of choice, humidifier, supplies, airView/ card  Please make sure she has a return ov in 31-90 days

## 2019-07-27 NOTE — Addendum Note (Signed)
Addended by: Cydney Ok on: 07/27/2019 09:09 AM   Modules accepted: Orders

## 2019-08-21 ENCOUNTER — Encounter: Payer: Self-pay | Admitting: Family Medicine

## 2019-08-25 ENCOUNTER — Telehealth (HOSPITAL_COMMUNITY): Payer: 59 | Admitting: Psychiatry

## 2019-08-25 ENCOUNTER — Other Ambulatory Visit: Payer: Self-pay

## 2019-08-25 NOTE — Progress Notes (Signed)
ICD-10-CM   1. Symptomatic mammary hypertrophy  N62   2. Neck pain  M54.2   3. Chronic bilateral thoracic back pain  M54.6    G89.29       Patient ID: Nancy Barnes, female    DOB: 1970-07-14, 49 y.o.   MRN: [id number]   History of Present Illness: Nancy Barnes is a 49 y.o.  female  with a history of symptomatic mammary hyperplasia.  She presents for preoperative evaluation for upcoming procedure, bilateral breast reduction with liposuction, scheduled for 09/14/2019 with Dr. Ulice Bold.  Summary from previous visit: Patient has extremely large and fairly symmetric breasts.  Sternal to nipple distance on the right is 28 cm and the left is 29 cm.  The IMF distance is 19 cm.  She is 5 feet 3 inches tall and weighs 180 pounds.  Preoperative bra size is 38 G and she would like to be a B/C cup.  The excess estimated breast tissue to be removed at time of surgery is 480 g on each side.  Job: Works at [employer].  PMH Significant for: HTN, hypokalemia, pneumomediastinum (2015), anxiety, depression, asthma  The patient has had problems with anesthesia. Patient reports she is highly allergic to NSAIDs and believes she was given Toradol by anesthesia and believe she was going into anaphylaxis after her last surgery.   Past Medical History: Allergies: Allergies  Allergen Reactions  . Nsaids Shortness Of Breath  . Shellfish Allergy Anaphylaxis  . Abilify [Aripiprazole] Other (See Comments)    hallucinations  . Amlodipine   . Amoxicillin   . Fish Allergy Other (See Comments)    Reaction unknown  . Latuda [Lurasidone Hcl] Other (See Comments)    Uncontrollable shaking, hopelessness.  Barbera Setters [Levofloxacin In D5w] Other (See Comments)    Reaction unknown  . Other Other (See Comments)    Reaction unknown to nuts  . Penicillins Other (See Comments)    Reaction unknown Has patient had a PCN reaction causing immediate rash, facial/tongue/throat swelling, SOB or  lightheadedness with hypotension: Yes Has patient had a PCN reaction causing severe rash involving mucus membranes or skin necrosis: No Has patient had a PCN reaction that required hospitalization: No Has patient had a PCN reaction occurring within the last 10 years: No If all of the above answers are "NO", then may proceed with Cephalosporin use.  . Prednisone Other (See Comments)    Pruritus/ hives  . Seroquel [Quetiapine Fumarate] Other (See Comments)    Patient states it made her not care about the world and she just sat down and didn't get up.  . Sulfa Antibiotics Other (See Comments)    Reaction unknown  . Ativan [Lorazepam] Anxiety    Shakiness  . Xanax [Alprazolam] Anxiety    Makes pt. anxious    Current Medications:  Current Outpatient Medications:  .  Ascorbic Acid (VITAMIN C) 100 MG tablet, Take 100 mg by mouth daily., Disp: , Rfl:  .  b complex vitamins capsule, Take 1 capsule by mouth daily., Disp: , Rfl:  .  BREO ELLIPTA 200-25 MCG/INH AEPB, Inhale 200 mcg into the lungs daily., Disp: , Rfl:  .  budesonide-formoterol (SYMBICORT) 160-4.5 MCG/ACT inhaler, Inhale 2 puffs then rinse mouth, twice daily, Disp: 1 Inhaler, Rfl: 6 .  cetirizine (ZYRTEC) 10 MG chewable tablet, Chew 10 mg by mouth daily., Disp: , Rfl:  .  ferrous sulfate 325 (65 FE) MG tablet, Take 325 mg by mouth daily  with breakfast., Disp: , Rfl:  .  hydrochlorothiazide (HYDRODIURIL) 12.5 MG tablet, Take 1 tablet (12.5 mg total) by mouth daily., Disp: 90 tablet, Rfl: 1 .  hydrOXYzine (ATARAX/VISTARIL) 10 MG tablet, Take 1 tablet (10 mg total) by mouth 3 (three) times daily as needed for anxiety., Disp: 270 tablet, Rfl: 0 .  ipratropium-albuterol (DUONEB) 0.5-2.5 (3) MG/3ML SOLN, Take 3 mLs by nebulization every 6 (six) hours as needed (shortness of breath/wheezing.)., Disp: , Rfl:  .  losartan (COZAAR) 100 MG tablet, Take 1 tablet (100 mg total) by mouth daily., Disp: 90 tablet, Rfl: 1 .  omeprazole (PRILOSEC) 10  MG capsule, Take 10 mg by mouth daily., Disp: , Rfl:  .  venlafaxine XR (EFFEXOR-XR) 75 MG 24 hr capsule, Take 3 capsules (225 mg total) by mouth daily with breakfast., Disp: 270 capsule, Rfl: 0 .  VENTOLIN HFA 108 (90 Base) MCG/ACT inhaler, Inhale 2 puffs into the lungs every 6 (six) hours as needed. INHALE 2 PUFFS BY MOUTH EVERY 6 HOURS AS NEEDED FOR WHEEZING OR SHORTNESS OF BREATH, Disp: 18 g, Rfl: 3 .  doxycycline (VIBRA-TABS) 100 MG tablet, Take 1 tablet (100 mg total) by mouth 2 (two) times daily for 3 days., Disp: 6 tablet, Rfl: 0 .  EPINEPHrine 0.3 mg/0.3 mL IJ SOAJ injection, Inject into the muscle. (Patient not taking: Reported on 08/26/2019), Disp: , Rfl:  .  HYDROcodone-acetaminophen (NORCO) 5-325 MG tablet, Take 1 tablet by mouth every 8 (eight) hours as needed for up to 7 days for severe pain. For use AFTER Surgery, Disp: 21 tablet, Rfl: 0 .  ipratropium (ATROVENT) 0.03 % nasal spray, Place 0.03 sprays into the nose 2 (two) times daily. 2 sprays each nostril twice daily (Patient not taking: Reported on 08/26/2019), Disp: , Rfl:  .  liothyronine (CYTOMEL) 5 MCG tablet, Take 1 tablet (5 mcg total) by mouth daily., Disp: 90 tablet, Rfl: 0 .  ondansetron (ZOFRAN) 4 MG tablet, Take 1 tablet (4 mg total) by mouth every 8 (eight) hours as needed for nausea or vomiting., Disp: 20 tablet, Rfl: 0 .  Probiotic Product (PROBIOTIC-10 PO), Take 1 tablet by mouth daily. , Disp: , Rfl:   Past Medical Problems: Past Medical History:  Diagnosis Date  . Anxiety   . Asthma   . Chicken pox   . Depression   . Environmental allergies    food/medication  . Headache(784.0)   . Hypertension   . Hypokalemia   . Pneumomediastinum (HCC)   . Seasonal allergies   . Suicidal ideations   . UTI (urinary tract infection)     Past Surgical History: Past Surgical History:  Procedure Laterality Date  . ABLATION    . BREAST BIOPSY  04/2011  . TUBAL LIGATION      Social History: Social History    Socioeconomic History  . Marital status: Divorced    Spouse name: Not on file  . Number of children: Not on file  . Years of education: Not on file  . Highest education level: Not on file  Occupational History  . 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 Occupation: Engineer, water  Tobacco Use  . Smoking status: Never Smoker  . Smokeless tobacco: Never Used  Vaping Use  . Vaping Use: Never used  Substance and Sexual Activity  . Alcohol use: Not Currently    Comment: socially  . Drug use: No  . Sexual  activity: Yes    Partners: Male    Birth control/protection: None, Condom  Other Topics Concern  . Not on file  Social History Narrative   Works for [employer]. Lives at home with 2 teenage children. Never smoker.   Social Determinants of Health   Financial Resource Strain:   . Difficulty of Paying Living Expenses:   Food Insecurity:   . Worried About Programme researcher, broadcasting/film/video in the Last Year:   . Barista in the Last Year:   Transportation Needs:   . Freight forwarder (Medical):   Marland Kitchen Lack of Transportation (Non-Medical):   Physical Activity:   . Days of Exercise per Week:   . Minutes of Exercise per Session:   Stress:   . Feeling of Stress :   Social Connections:   . Frequency of Communication with Friends and Family:   . Frequency of Social Gatherings with Friends and Family:   . Attends Religious Services:   . Active Member of Clubs or Organizations:   . Attends Banker Meetings:   Marland Kitchen Marital Status:   Intimate Partner Violence:   . Fear of Current or Ex-Partner:   . Emotionally Abused:   Marland Kitchen Physically Abused:   . Sexually Abused:     Family History: Family History  Problem Relation Age of Onset  . Heart attack Mother 64       Bypass x5  . Allergies Mother   . CAD Mother   . High blood pressure Mother   . High Cholesterol Mother   . Heart  disease Mother   . Arthritis Mother   . Diabetes Mellitus II Father   . Hypertension Father   . Heart disease Father   . CAD Father   . Other Father        Agent orange exposure  . Arthritis Father   . Depression Father   . Allergies Daughter   . Psoriasis Daughter   . Arthritis Daughter        psoriatic  . Hypothyroidism Daughter   . Allergies Maternal Grandmother   . Atrial fibrillation Maternal Grandmother   . Stroke Maternal Grandmother   . Asthma Maternal Grandfather   . Asthma Paternal Grandmother   . Asthma Paternal Grandfather   . Suicidality Neg Hx   . Anxiety disorder Neg Hx     Review of Systems: Review of Systems  Constitutional: Negative for chills and fever.  HENT: Negative for congestion and sore throat.   Respiratory: Negative for cough and shortness of breath.   Cardiovascular: Negative for chest pain and palpitations.  Gastrointestinal: Negative for abdominal pain, nausea and vomiting.  Musculoskeletal: Positive for back pain and neck pain. Negative for joint pain and myalgias.  Skin: Negative for itching and rash.    Physical Exam: Vital Signs BP 129/86 (BP Location: Left Arm, Patient Position: Sitting, Cuff Size: Large)   Pulse 83   Temp 98 F (36.7 C) (Oral)   Ht  (1.626 m)   Wt 174 lb 3.2 oz (79 kg)   SpO2 100%   BMI 29.90 kg/m  Physical Exam Constitutional:      General: She is not in acute distress.    Appearance: Normal appearance. She is not ill-appearing.  HENT:     Head: Normocephalic and atraumatic.  Eyes:     Extraocular Movements: Extraocular movements intact.  Cardiovascular:     Rate and Rhythm: Normal rate.     Pulses: Normal  pulses.  Pulmonary:     Effort: Pulmonary effort is normal.     Breath sounds: Normal breath sounds. No wheezing, rhonchi or rales.  Abdominal:     General: Bowel sounds are normal.     Palpations: Abdomen is soft.  Musculoskeletal:        General: No swelling. Normal range of motion.      Cervical back: Normal range of motion.  Skin:    General: Skin is warm and dry.     Coloration: Skin is not pale.     Findings: No erythema or rash.  Neurological:     General: No focal deficit present.     Mental Status: She is alert and oriented to person, place, and time.  Psychiatric:        Mood and Affect: Mood normal.        Behavior: Behavior normal.        Thought Content: Thought content normal.        Judgment: Judgment normal.     Assessment/Plan:  Ms. Hart RochesterLawson scheduled for bilateral breast reduction with liposuction with Dr. Ulice Boldillingham.  Risks, benefits, and alternatives of procedure discussed, questions answered and consent obtained.    Smoking Status: non-smoker; Counseling Given? N/A Last Mammogram: 07/22/2019; Results: No findings suspicious for malignancy  Caprini Score: 4 Moderate; Risk Factors include: 49 year old female, BMI > 25, and length of planned surgery. Recommendation for mechanical or pharmacological prophylaxis during surgery. Encourage early ambulation.   Pictures obtained: 06/24/2019  Post-op Rx sent to pharmacy: Norco, Zofran, Doxy (patient preference due to allergies)  Patient was provided with the Breast Reduction Risks and General Surgical Risk consent document and Pain Medication Agreement prior to their appointment.  They had adequate time to read through the risk consent documents and Pain Medication Agreement. We also discussed them in person together during this preop appointment. All of their questions were answered to their satisfaction.  Recommended calling if they have any further questions.  Risk consent form and Pain Medication Agreement to be scanned into patient's chart.  The risk that can be encountered with breast reduction were discussed and include the following but not limited to these:  Breast asymmetry, fluid accumulation, firmness of the breast, inability to breast feed, loss of nipple or areola, skin loss, decrease or no nipple  sensation, fat necrosis of the breast tissue, bleeding, infection, healing delay.  There are risks of anesthesia, changes to skin sensation and injury to nerves or blood vessels.  The muscle can be temporarily or permanently injured.  You may have an allergic reaction to tape, suture, glue, blood products which can result in skin discoloration, swelling, pain, skin lesions, poor healing.  Any of these can lead to the need for revisonal surgery or stage procedures.  A reduction has potential to interfere with diagnostic procedures.  Nipple or breast piercing can increase risks of infection.  This procedure is best done when the breast is fully developed.  Changes in the breast will continue to occur over time.  Pregnancy can alter the outcomes of previous breast reduction surgery, weight gain and weigh loss can also effect the long term appearance.   The risks that can be encountered with and after liposuction were discussed and include the following but no limited to these:  Asymmetry, fluid accumulation, firmness of the area, fat necrosis with death of fat tissue, bleeding, infection, delayed healing, anesthesia risks, skin sensation changes, injury to structures including nerves, blood vessels, and muscles which may  be temporary or permanent, allergies to tape, suture materials and glues, blood products, topical preparations or injected agents, skin and contour irregularities, skin discoloration and swelling, deep vein thrombosis, cardiac and pulmonary complications, pain, which may persist, persistent pain, recurrence of the lesion, poor healing of the incision, possible need for revisional surgery or staged procedures. Thiere can also be persistent swelling, poor wound healing, rippling or loose skin, worsening of cellulite, swelling, and thermal burn or heat injury from ultrasound with the ultrasound-assisted lipoplasty technique. Any change in weight fluctuations can alter the outcome.  The 21st Century  Cures Act was signed into law in 2016 which includes the topic of electronic health records.  This provides immediate access to information in MyChart.  This includes consultation notes, operative notes, office notes, lab results and pathology reports.  If you have any questions about what you read please let us know at your next visit or call us at the office.  We are right here with you.   Electronically signed by: Eldridge Abrahams, PA-C 08/26/2019 3:29 PM

## 2019-08-25 NOTE — H&P (View-Only) (Signed)
ICD-10-CM   1. Symptomatic mammary hypertrophy  N62   2. Neck pain  M54.2   3. Chronic bilateral thoracic back pain  M54.6    G89.29       Patient ID: Nancy Barnes, female    DOB: 1970-07-14, 49 y.o.   MRN: [id number]   History of Present Illness: Nancy Barnes is a 49 y.o.  female  with a history of symptomatic mammary hyperplasia.  She presents for preoperative evaluation for upcoming procedure, bilateral breast reduction with liposuction, scheduled for 09/14/2019 with Dr. Ulice Bold.  Summary from previous visit: Patient has extremely large and fairly symmetric breasts.  Sternal to nipple distance on the right is 28 cm and the left is 29 cm.  The IMF distance is 19 cm.  She is 5 feet 3 inches tall and weighs 180 pounds.  Preoperative bra size is 38 G and she would like to be a B/C cup.  The excess estimated breast tissue to be removed at time of surgery is 480 g on each side.  Job: Works at [employer].  PMH Significant for: HTN, hypokalemia, pneumomediastinum (2015), anxiety, depression, asthma  The patient has had problems with anesthesia. Patient reports she is highly allergic to NSAIDs and believes she was given Toradol by anesthesia and believe she was going into anaphylaxis after her last surgery.   Past Medical History: Allergies: Allergies  Allergen Reactions  . Nsaids Shortness Of Breath  . Shellfish Allergy Anaphylaxis  . Abilify [Aripiprazole] Other (See Comments)    hallucinations  . Amlodipine   . Amoxicillin   . Fish Allergy Other (See Comments)    Reaction unknown  . Latuda [Lurasidone Hcl] Other (See Comments)    Uncontrollable shaking, hopelessness.  Barbera Setters [Levofloxacin In D5w] Other (See Comments)    Reaction unknown  . Other Other (See Comments)    Reaction unknown to nuts  . Penicillins Other (See Comments)    Reaction unknown Has patient had a PCN reaction causing immediate rash, facial/tongue/throat swelling, SOB or  lightheadedness with hypotension: Yes Has patient had a PCN reaction causing severe rash involving mucus membranes or skin necrosis: No Has patient had a PCN reaction that required hospitalization: No Has patient had a PCN reaction occurring within the last 10 years: No If all of the above answers are "NO", then may proceed with Cephalosporin use.  . Prednisone Other (See Comments)    Pruritus/ hives  . Seroquel [Quetiapine Fumarate] Other (See Comments)    Patient states it made her not care about the world and she just sat down and didn't get up.  . Sulfa Antibiotics Other (See Comments)    Reaction unknown  . Ativan [Lorazepam] Anxiety    Shakiness  . Xanax [Alprazolam] Anxiety    Makes pt. anxious    Current Medications:  Current Outpatient Medications:  .  Ascorbic Acid (VITAMIN C) 100 MG tablet, Take 100 mg by mouth daily., Disp: , Rfl:  .  b complex vitamins capsule, Take 1 capsule by mouth daily., Disp: , Rfl:  .  BREO ELLIPTA 200-25 MCG/INH AEPB, Inhale 200 mcg into the lungs daily., Disp: , Rfl:  .  budesonide-formoterol (SYMBICORT) 160-4.5 MCG/ACT inhaler, Inhale 2 puffs then rinse mouth, twice daily, Disp: 1 Inhaler, Rfl: 6 .  cetirizine (ZYRTEC) 10 MG chewable tablet, Chew 10 mg by mouth daily., Disp: , Rfl:  .  ferrous sulfate 325 (65 FE) MG tablet, Take 325 mg by mouth daily  with breakfast., Disp: , Rfl:  .  hydrochlorothiazide (HYDRODIURIL) 12.5 MG tablet, Take 1 tablet (12.5 mg total) by mouth daily., Disp: 90 tablet, Rfl: 1 .  hydrOXYzine (ATARAX/VISTARIL) 10 MG tablet, Take 1 tablet (10 mg total) by mouth 3 (three) times daily as needed for anxiety., Disp: 270 tablet, Rfl: 0 .  ipratropium-albuterol (DUONEB) 0.5-2.5 (3) MG/3ML SOLN, Take 3 mLs by nebulization every 6 (six) hours as needed (shortness of breath/wheezing.)., Disp: , Rfl:  .  losartan (COZAAR) 100 MG tablet, Take 1 tablet (100 mg total) by mouth daily., Disp: 90 tablet, Rfl: 1 .  omeprazole (PRILOSEC) 10  MG capsule, Take 10 mg by mouth daily., Disp: , Rfl:  .  venlafaxine XR (EFFEXOR-XR) 75 MG 24 hr capsule, Take 3 capsules (225 mg total) by mouth daily with breakfast., Disp: 270 capsule, Rfl: 0 .  VENTOLIN HFA 108 (90 Base) MCG/ACT inhaler, Inhale 2 puffs into the lungs every 6 (six) hours as needed. INHALE 2 PUFFS BY MOUTH EVERY 6 HOURS AS NEEDED FOR WHEEZING OR SHORTNESS OF BREATH, Disp: 18 g, Rfl: 3 .  doxycycline (VIBRA-TABS) 100 MG tablet, Take 1 tablet (100 mg total) by mouth 2 (two) times daily for 3 days., Disp: 6 tablet, Rfl: 0 .  EPINEPHrine 0.3 mg/0.3 mL IJ SOAJ injection, Inject into the muscle. (Patient not taking: Reported on 08/26/2019), Disp: , Rfl:  .  HYDROcodone-acetaminophen (NORCO) 5-325 MG tablet, Take 1 tablet by mouth every 8 (eight) hours as needed for up to 7 days for severe pain. For use AFTER Surgery, Disp: 21 tablet, Rfl: 0 .  ipratropium (ATROVENT) 0.03 % nasal spray, Place 0.03 sprays into the nose 2 (two) times daily. 2 sprays each nostril twice daily (Patient not taking: Reported on 08/26/2019), Disp: , Rfl:  .  liothyronine (CYTOMEL) 5 MCG tablet, Take 1 tablet (5 mcg total) by mouth daily., Disp: 90 tablet, Rfl: 0 .  ondansetron (ZOFRAN) 4 MG tablet, Take 1 tablet (4 mg total) by mouth every 8 (eight) hours as needed for nausea or vomiting., Disp: 20 tablet, Rfl: 0 .  Probiotic Product (PROBIOTIC-10 PO), Take 1 tablet by mouth daily. , Disp: , Rfl:   Past Medical Problems: Past Medical History:  Diagnosis Date  . Anxiety   . Asthma   . Chicken pox   . Depression   . Environmental allergies    food/medication  . Headache(784.0)   . Hypertension   . Hypokalemia   . Pneumomediastinum (HCC)   . Seasonal allergies   . Suicidal ideations   . UTI (urinary tract infection)     Past Surgical History: Past Surgical History:  Procedure Laterality Date  . ABLATION    . BREAST BIOPSY  04/2011  . TUBAL LIGATION      Social History: Social History    Socioeconomic History  . Marital status: Divorced    Spouse name: Not on file  . Number of children: Not on file  . Years of education: Not on file  . Highest education level: Not on file  Occupational History  . 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 Occupation: Engineer, water  Tobacco Use  . Smoking status: Never Smoker  . Smokeless tobacco: Never Used  Vaping Use  . Vaping Use: Never used  Substance and Sexual Activity  . Alcohol use: Not Currently    Comment: socially  . Drug use: No  . Sexual  activity: Yes    Partners: Male    Birth control/protection: None, Condom  Other Topics Concern  . Not on file  Social History Narrative   Works for [employer]. Lives at home with 2 teenage children. Never smoker.   Social Determinants of Health   Financial Resource Strain:   . Difficulty of Paying Living Expenses:   Food Insecurity:   . Worried About Running Out of Food in the Last Year:   . Ran Out of Food in the Last Year:   Transportation Needs:   . Lack of Transportation (Medical):   . Lack of Transportation (Non-Medical):   Physical Activity:   . Days of Exercise per Week:   . Minutes of Exercise per Session:   Stress:   . Feeling of Stress :   Social Connections:   . Frequency of Communication with Friends and Family:   . Frequency of Social Gatherings with Friends and Family:   . Attends Religious Services:   . Active Member of Clubs or Organizations:   . Attends Club or Organization Meetings:   . Marital Status:   Intimate Partner Violence:   . Fear of Current or Ex-Partner:   . Emotionally Abused:   . Physically Abused:   . Sexually Abused:     Family History: Family History  Problem Relation Age of Onset  . Heart attack Mother 60       Bypass x5  . Allergies Mother   . CAD Mother   . High blood pressure Mother   . High Cholesterol Mother   . Heart  disease Mother   . Arthritis Mother   . Diabetes Mellitus II Father   . Hypertension Father   . Heart disease Father   . CAD Father   . Other Father        Agent orange exposure  . Arthritis Father   . Depression Father   . Allergies Daughter   . Psoriasis Daughter   . Arthritis Daughter        psoriatic  . Hypothyroidism Daughter   . Allergies Maternal Grandmother   . Atrial fibrillation Maternal Grandmother   . Stroke Maternal Grandmother   . Asthma Maternal Grandfather   . Asthma Paternal Grandmother   . Asthma Paternal Grandfather   . Suicidality Neg Hx   . Anxiety disorder Neg Hx     Review of Systems: Review of Systems  Constitutional: Negative for chills and fever.  HENT: Negative for congestion and sore throat.   Respiratory: Negative for cough and shortness of breath.   Cardiovascular: Negative for chest pain and palpitations.  Gastrointestinal: Negative for abdominal pain, nausea and vomiting.  Musculoskeletal: Positive for back pain and neck pain. Negative for joint pain and myalgias.  Skin: Negative for itching and rash.    Physical Exam: Vital Signs BP 129/86 (BP Location: Left Arm, Patient Position: Sitting, Cuff Size: Large)   Pulse 83   Temp 98 F (36.7 C) (Oral)   Ht 5' 4" (1.626 m)   Wt 174 lb 3.2 oz (79 kg)   SpO2 100%   BMI 29.90 kg/m  Physical Exam Constitutional:      General: She is not in acute distress.    Appearance: Normal appearance. She is not ill-appearing.  HENT:     Head: Normocephalic and atraumatic.  Eyes:     Extraocular Movements: Extraocular movements intact.  Cardiovascular:     Rate and Rhythm: Normal rate.     Pulses: Normal   pulses.  Pulmonary:     Effort: Pulmonary effort is normal.     Breath sounds: Normal breath sounds. No wheezing, rhonchi or rales.  Abdominal:     General: Bowel sounds are normal.     Palpations: Abdomen is soft.  Musculoskeletal:        General: No swelling. Normal range of motion.      Cervical back: Normal range of motion.  Skin:    General: Skin is warm and dry.     Coloration: Skin is not pale.     Findings: No erythema or rash.  Neurological:     General: No focal deficit present.     Mental Status: She is alert and oriented to person, place, and time.  Psychiatric:        Mood and Affect: Mood normal.        Behavior: Behavior normal.        Thought Content: Thought content normal.        Judgment: Judgment normal.     Assessment/Plan:  Nancy Barnes scheduled for bilateral breast reduction with liposuction with Dr. Ulice Boldillingham.  Risks, benefits, and alternatives of procedure discussed, questions answered and consent obtained.    Smoking Status: non-smoker; Counseling Given? N/A Last Mammogram: 07/22/2019; Results: No findings suspicious for malignancy  Caprini Score: 4 Moderate; Risk Factors include: 49 year old female, BMI > 25, and length of planned surgery. Recommendation for mechanical or pharmacological prophylaxis during surgery. Encourage early ambulation.   Pictures obtained: 06/24/2019  Post-op Rx sent to pharmacy: Norco, Zofran, Doxy (patient preference due to allergies)  Patient was provided with the Breast Reduction Risks and General Surgical Risk consent document and Pain Medication Agreement prior to their appointment.  They had adequate time to read through the risk consent documents and Pain Medication Agreement. We also discussed them in person together during this preop appointment. All of their questions were answered to their satisfaction.  Recommended calling if they have any further questions.  Risk consent form and Pain Medication Agreement to be scanned into patient's chart.  The risk that can be encountered with breast reduction were discussed and include the following but not limited to these:  Breast asymmetry, fluid accumulation, firmness of the breast, inability to breast feed, loss of nipple or areola, skin loss, decrease or no nipple  sensation, fat necrosis of the breast tissue, bleeding, infection, healing delay.  There are risks of anesthesia, changes to skin sensation and injury to nerves or blood vessels.  The muscle can be temporarily or permanently injured.  You may have an allergic reaction to tape, suture, glue, blood products which can result in skin discoloration, swelling, pain, skin lesions, poor healing.  Any of these can lead to the need for revisonal surgery or stage procedures.  A reduction has potential to interfere with diagnostic procedures.  Nipple or breast piercing can increase risks of infection.  This procedure is best done when the breast is fully developed.  Changes in the breast will continue to occur over time.  Pregnancy can alter the outcomes of previous breast reduction surgery, weight gain and weigh loss can also effect the long term appearance.   The risks that can be encountered with and after liposuction were discussed and include the following but no limited to these:  Asymmetry, fluid accumulation, firmness of the area, fat necrosis with death of fat tissue, bleeding, infection, delayed healing, anesthesia risks, skin sensation changes, injury to structures including nerves, blood vessels, and muscles which may  be temporary or permanent, allergies to tape, suture materials and glues, blood products, topical preparations or injected agents, skin and contour irregularities, skin discoloration and swelling, deep vein thrombosis, cardiac and pulmonary complications, pain, which may persist, persistent pain, recurrence of the lesion, poor healing of the incision, possible need for revisional surgery or staged procedures. Thiere can also be persistent swelling, poor wound healing, rippling or loose skin, worsening of cellulite, swelling, and thermal burn or heat injury from ultrasound with the ultrasound-assisted lipoplasty technique. Any change in weight fluctuations can alter the outcome.  The 21st Century  Cures Act was signed into law in 2016 which includes the topic of electronic health records.  This provides immediate access to information in MyChart.  This includes consultation notes, operative notes, office notes, lab results and pathology reports.  If you have any questions about what you read please let us know at your next visit or call us at the office.  We are right here with you.   Electronically signed by: Eldridge Abrahams, PA-C 08/26/2019 3:29 PM

## 2019-08-26 ENCOUNTER — Other Ambulatory Visit: Payer: Self-pay

## 2019-08-26 ENCOUNTER — Ambulatory Visit (INDEPENDENT_AMBULATORY_CARE_PROVIDER_SITE_OTHER): Payer: 59 | Admitting: Plastic Surgery

## 2019-08-26 ENCOUNTER — Encounter: Payer: Self-pay | Admitting: Plastic Surgery

## 2019-08-26 VITALS — BP 129/86 | HR 83 | Temp 98.0°F | Ht 64.0 in | Wt 174.2 lb

## 2019-08-26 DIAGNOSIS — G8929 Other chronic pain: Secondary | ICD-10-CM

## 2019-08-26 DIAGNOSIS — M546 Pain in thoracic spine: Secondary | ICD-10-CM

## 2019-08-26 DIAGNOSIS — N62 Hypertrophy of breast: Secondary | ICD-10-CM

## 2019-08-26 DIAGNOSIS — M542 Cervicalgia: Secondary | ICD-10-CM

## 2019-08-26 MED ORDER — DOXYCYCLINE HYCLATE 100 MG PO TABS: 100.0000 mg | ORAL_TABLET | Freq: Two times a day (BID) | ORAL | 0 refills | Status: AC

## 2019-08-26 MED ORDER — HYDROCODONE-ACETAMINOPHEN 5-325 MG PO TABS: 1.0000 | ORAL_TABLET | Freq: Three times a day (TID) | ORAL | 0 refills | Status: AC | PRN

## 2019-08-26 MED ORDER — ONDANSETRON HCL 4 MG PO TABS: 4.0000 mg | ORAL_TABLET | Freq: Three times a day (TID) | ORAL | 0 refills | Status: DC | PRN

## 2019-09-02 ENCOUNTER — Other Ambulatory Visit: Payer: Self-pay

## 2019-09-02 ENCOUNTER — Encounter: Payer: Self-pay | Admitting: Family Medicine

## 2019-09-02 ENCOUNTER — Ambulatory Visit (INDEPENDENT_AMBULATORY_CARE_PROVIDER_SITE_OTHER): Payer: 59 | Admitting: Family Medicine

## 2019-09-02 VITALS — BP 140/80 | HR 70 | Temp 98.1°F | Wt 176.2 lb

## 2019-09-02 DIAGNOSIS — F419 Anxiety disorder, unspecified: Secondary | ICD-10-CM

## 2019-09-02 DIAGNOSIS — J45909 Unspecified asthma, uncomplicated: Secondary | ICD-10-CM | POA: Diagnosis not present

## 2019-09-02 DIAGNOSIS — F341 Dysthymic disorder: Secondary | ICD-10-CM

## 2019-09-02 DIAGNOSIS — J3089 Other allergic rhinitis: Secondary | ICD-10-CM

## 2019-09-02 DIAGNOSIS — N62 Hypertrophy of breast: Secondary | ICD-10-CM

## 2019-09-02 DIAGNOSIS — J302 Other seasonal allergic rhinitis: Secondary | ICD-10-CM

## 2019-09-02 DIAGNOSIS — I10 Essential (primary) hypertension: Secondary | ICD-10-CM | POA: Insufficient documentation

## 2019-09-02 MED ORDER — IPRATROPIUM-ALBUTEROL 0.5-2.5 (3) MG/3ML IN SOLN: 3.0000 mL | Freq: Four times a day (QID) | RESPIRATORY_TRACT | 5 refills | Status: DC | PRN

## 2019-09-02 MED ORDER — EPINEPHRINE 0.3 MG/0.3ML IJ SOAJ: 0.3000 mg | INTRAMUSCULAR | 5 refills | Status: DC | PRN

## 2019-09-02 MED ORDER — IRBESARTAN 300 MG PO TABS
300.0000 mg | ORAL_TABLET | Freq: Every day | ORAL | 1 refills | Status: DC
Start: 2019-09-02 — End: 2020-01-10

## 2019-09-02 NOTE — Progress Notes (Signed)
Nancy Barnes DOB: January 28, 1970 Encounter date: 09/02/2019  This is a 49 y.o. female who presents with Chief Complaint  Patient presents with  . Follow-up    History of present illness: HTN: hctz 25mg , (?switch from losartan to irbesartan, ?add spironolactone). Blood pressure has been all over the place. Last visit with Dr. was lowest she has seen. She does have cpap now; still adjusting to it, but sleeping all night.   Asthma: using breo; but has a hard time with copay for this medication. Feels that this has worked quicker for her than symbicort.   Seasonal allergies: stable.   Allergies  Allergen Reactions  . Nsaids Anaphylaxis and Shortness Of Breath  . Shellfish Allergy Anaphylaxis  . Abilify [Aripiprazole] Other (See Comments)    hallucinations  . Amlodipine     swelling  . Amoxicillin Hives  . Fish Allergy Other (See Comments)    Reaction unknown  . Latuda [Lurasidone Hcl] Other (See Comments)    Uncontrollable shaking, hopelessness.  Ulice Bold [Levofloxacin In D5w] Other (See Comments)    Reaction unknown  . Other Other (See Comments)    Reaction unknown to nuts  . Penicillins Other (See Comments)    Reaction unknown Has patient had a PCN reaction causing immediate rash, facial/tongue/throat swelling, SOB or lightheadedness with hypotension: Yes Has patient had a PCN reaction causing severe rash involving mucus membranes or skin necrosis: No Has patient had a PCN reaction that required hospitalization: No Has patient had a PCN reaction occurring within the last 10 years: No If all of the above answers are "NO", then may proceed with Cephalosporin use.  . Prednisone Other (See Comments)    Pruritus/ hives  . Seroquel [Quetiapine Fumarate] Other (See Comments)    Patient states it made her not care about the world and she just sat down and didn't get up.  . Ativan [Lorazepam] Anxiety    Shakiness  . Sulfa Antibiotics Rash  . Xanax [Alprazolam]  Anxiety    Makes pt. anxious   Current Meds  Medication Sig  . Ascorbic Acid (VITAMIN C) 100 MG tablet Take 100 mg by mouth daily.  Barbera Setters b complex vitamins capsule Take 1 capsule by mouth daily.  . cetirizine (ZYRTEC) 10 MG chewable tablet Chew 10 mg by mouth daily.  . ferrous sulfate 325 (65 FE) MG tablet Take 325 mg by mouth daily with breakfast.  . hydrochlorothiazide (HYDRODIURIL) 12.5 MG tablet Take 1 tablet (12.5 mg total) by mouth daily.  Marland Kitchen HYDROcodone-acetaminophen (NORCO) 5-325 MG tablet Take 1 tablet by mouth every 8 (eight) hours as needed for up to 7 days for severe pain. For use AFTER Surgery  . hydrOXYzine (ATARAX/VISTARIL) 10 MG tablet Take 1 tablet (10 mg total) by mouth 3 (three) times daily as needed for anxiety.  Marland Kitchen ipratropium-albuterol (DUONEB) 0.5-2.5 (3) MG/3ML SOLN Take 3 mLs by nebulization every 6 (six) hours as needed (shortness of breath/wheezing.).  Marland Kitchen losartan (COZAAR) 100 MG tablet Take 1 tablet (100 mg total) by mouth daily.  Marland Kitchen omeprazole (PRILOSEC) 10 MG capsule Take 10 mg by mouth daily.  . ondansetron (ZOFRAN) 4 MG tablet Take 1 tablet (4 mg total) by mouth every 8 (eight) hours as needed for nausea or vomiting.  . venlafaxine XR (EFFEXOR-XR) 75 MG 24 hr capsule Take 3 capsules (225 mg total) by mouth daily with breakfast.  . VENTOLIN HFA 108 (90 Base) MCG/ACT inhaler Inhale 2 puffs into the lungs every 6 (six) hours as needed.  INHALE 2 PUFFS BY MOUTH EVERY 6 HOURS AS NEEDED FOR WHEEZING OR SHORTNESS OF BREATH    Review of Systems  Constitutional: Negative for chills, fatigue and fever.  Respiratory: Negative for cough, chest tightness, shortness of breath and wheezing.   Cardiovascular: Negative for chest pain, palpitations and leg swelling.    Objective:  BP 140/80 (BP Location: Right Arm, Patient Position: Sitting, Cuff Size: Normal)   Pulse 70   Temp 98.1 F (36.7 C) (Oral)   Wt 176 lb 3.2 oz (79.9 kg)   SpO2 98%   BMI 30.24 kg/m   Weight: 176 lb  3.2 oz (79.9 kg)   BP Readings from Last 3 Encounters:  09/02/19 140/80  08/26/19 129/86  07/23/19 (!) 144/106   Wt Readings from Last 3 Encounters:  09/02/19 176 lb 3.2 oz (79.9 kg)  08/26/19 174 lb 3.2 oz (79 kg)  07/23/19 171 lb (77.6 kg)    Physical Exam Constitutional:      General: She is not in acute distress.    Appearance: She is well-developed.  Cardiovascular:     Rate and Rhythm: Normal rate and regular rhythm.     Heart sounds: Normal heart sounds. No murmur heard.  No friction rub.  Pulmonary:     Effort: Pulmonary effort is normal. No respiratory distress.     Breath sounds: Normal breath sounds. No wheezing or rales.  Musculoskeletal:     Right lower leg: No edema.     Left lower leg: No edema.  Neurological:     Mental Status: She is alert and oriented to person, place, and time.  Psychiatric:        Behavior: Behavior normal.     Assessment/Plan  1. Asthma, intrinsic Has been stable. Number given for breo support; she will let me know if she doesn't qualify. Inhalers very expensive on her plan and cost is limiting her ability to keep asthma well controlled and take medications regularly.   2. Seasonal and perennial allergic rhinitis Stable. Continue with current zyrtec.  3. Dysthymia Has been stable. On cytomel.  4. Anxiety Well controlled. Continue current medications.   5. Essential hypertension Elevated. Recheck in office was 142/100. Will change losartan to irbesartan. Continue with hctz25mg  daily. She would like to avoid further diuretics if possible. Consider additional treatment pending response of bp. She will update me in a week.   6. Symptomatic mammary hypertrophy She is scheduled for breast reduction next month.   Return in about 3 months (around 12/03/2019) for Chronic condition visit.    Theodis Shove, MD

## 2019-09-02 NOTE — Patient Instructions (Signed)
Call [employer]: [phone] ask about assistance for [facility]

## 2019-09-06 ENCOUNTER — Other Ambulatory Visit: Payer: Self-pay

## 2019-09-06 ENCOUNTER — Encounter (HOSPITAL_BASED_OUTPATIENT_CLINIC_OR_DEPARTMENT_OTHER): Payer: Self-pay | Admitting: Plastic Surgery

## 2019-09-12 ENCOUNTER — Other Ambulatory Visit (HOSPITAL_COMMUNITY): Payer: 59

## 2019-09-13 ENCOUNTER — Other Ambulatory Visit (HOSPITAL_COMMUNITY)
Admission: RE | Admit: 2019-09-13 | Discharge: 2019-09-13 | Disposition: A | Discharge status: Home / Self Care | Payer: 59 | Source: Ambulatory Visit | Attending: Plastic Surgery | Admitting: Plastic Surgery

## 2019-09-13 ENCOUNTER — Encounter (HOSPITAL_BASED_OUTPATIENT_CLINIC_OR_DEPARTMENT_OTHER)
Admission: RE | Admit: 2019-09-13 | Discharge: 2019-09-13 | Disposition: A | Discharge status: Home / Self Care | Payer: 59 | Source: Ambulatory Visit | Attending: Plastic Surgery | Admitting: Plastic Surgery

## 2019-09-13 DIAGNOSIS — Z01818 Encounter for other preprocedural examination: Secondary | ICD-10-CM | POA: Insufficient documentation

## 2019-09-13 DIAGNOSIS — I10 Essential (primary) hypertension: Secondary | ICD-10-CM | POA: Insufficient documentation

## 2019-09-13 DIAGNOSIS — Z01812 Encounter for preprocedural laboratory examination: Secondary | ICD-10-CM | POA: Insufficient documentation

## 2019-09-13 DIAGNOSIS — Z20822 Contact with and (suspected) exposure to covid-19: Secondary | ICD-10-CM | POA: Diagnosis not present

## 2019-09-13 LAB — BASIC METABOLIC PANEL
Anion gap: 11 (ref 5–15)
BUN: 8 mg/dL (ref 6–20)
CO2: 28 mmol/L (ref 22–32)
Calcium: 9.4 mg/dL (ref 8.9–10.3)
Chloride: 100 mmol/L (ref 98–111)
Creatinine, Ser: 0.76 mg/dL (ref 0.44–1.00)
GFR calc Af Amer: >60 mL/min (ref >60)
GFR calc non Af Amer: >60 mL/min (ref >60)
Glucose, Bld: 137 mg/dL — ABNORMAL HIGH (ref 70–99)
Potassium: 3.3 mmol/L — ABNORMAL LOW (ref 3.5–5.1)
Sodium: 139 mmol/L (ref 135–145)

## 2019-09-13 LAB — SARS CORONAVIRUS 2 (TAT 6-24 HRS): SARS Coronavirus 2: NEGATIVE

## 2019-09-13 LAB — POCT PREGNANCY, URINE: Preg Test, Ur: NEGATIVE

## 2019-09-13 NOTE — Anesthesia Preprocedure Evaluation (Addendum)
Anesthesia Evaluation  Patient identified by MRN, date of birth, ID band Patient awake    Reviewed: Allergy & Precautions, NPO status , Patient's Chart, lab work & pertinent test results  History of Anesthesia Complications Negative for: history of anesthetic complications  Airway Mallampati: I  TM Distance: >3 FB Neck ROM: Full    Dental  (+) Dental Advisory Given, Teeth Intact   Pulmonary sleep apnea and Continuous Positive Airway Pressure Ventilation , COPD,  COPD inhaler,  09/13/2019 SARS coronavirus NEG   breath sounds clear to auscultation       Cardiovascular hypertension, Pt. on medications (-) angina Rhythm:Regular Rate:Normal     Neuro/Psych  Headaches, Anxiety Depression negative neurological ROS     GI/Hepatic Neg liver ROS, GERD  Controlled and Medicated,  Endo/Other  obese  Renal/GU negative Renal ROS     Musculoskeletal   Abdominal (+) + obese,   Peds  Hematology negative hematology ROS (+)   Anesthesia Other Findings   Reproductive/Obstetrics                            Anesthesia Physical Anesthesia Plan  ASA: III  Anesthesia Plan: General   Post-op Pain Management:    Induction: Intravenous  PONV Risk Score and Plan: 3 and Ondansetron, Dexamethasone and Scopolamine patch - Pre-op  Airway Management Planned: Oral ETT  Additional Equipment:   Intra-op Plan:   Post-operative Plan: Extubation in OR  Informed Consent: I have reviewed the patients History and Physical, chart, labs and discussed the procedure including the risks, benefits and alternatives for the proposed anesthesia with the patient or authorized representative who has indicated his/her understanding and acceptance.     Dental advisory given  Plan Discussed with: CRNA and Surgeon  Anesthesia Plan Comments:        Anesthesia Quick Evaluation

## 2019-09-14 ENCOUNTER — Encounter (HOSPITAL_BASED_OUTPATIENT_CLINIC_OR_DEPARTMENT_OTHER): Payer: Self-pay | Admitting: Plastic Surgery

## 2019-09-14 ENCOUNTER — Encounter (HOSPITAL_BASED_OUTPATIENT_CLINIC_OR_DEPARTMENT_OTHER)
Admission: RE | Disposition: A | Discharge status: Home / Self Care | Payer: Self-pay | Source: Home / Self Care | Attending: Plastic Surgery

## 2019-09-14 ENCOUNTER — Ambulatory Visit (HOSPITAL_BASED_OUTPATIENT_CLINIC_OR_DEPARTMENT_OTHER): Payer: 59 | Admitting: Certified Registered"

## 2019-09-14 ENCOUNTER — Ambulatory Visit (HOSPITAL_BASED_OUTPATIENT_CLINIC_OR_DEPARTMENT_OTHER)
Admission: RE | Admit: 2019-09-14 | Discharge: 2019-09-14 | Disposition: A | Discharge status: Home / Self Care | Payer: 59 | Attending: Plastic Surgery | Admitting: Plastic Surgery

## 2019-09-14 ENCOUNTER — Other Ambulatory Visit: Payer: Self-pay

## 2019-09-14 DIAGNOSIS — N62 Hypertrophy of breast: Secondary | ICD-10-CM | POA: Diagnosis present

## 2019-09-14 DIAGNOSIS — E876 Hypokalemia: Secondary | ICD-10-CM | POA: Diagnosis not present

## 2019-09-14 DIAGNOSIS — F329 Major depressive disorder, single episode, unspecified: Secondary | ICD-10-CM | POA: Insufficient documentation

## 2019-09-14 DIAGNOSIS — Z6829 Body mass index (BMI) 29.0-29.9, adult: Secondary | ICD-10-CM | POA: Insufficient documentation

## 2019-09-14 DIAGNOSIS — I10 Essential (primary) hypertension: Secondary | ICD-10-CM | POA: Diagnosis not present

## 2019-09-14 DIAGNOSIS — Z881 Allergy status to other antibiotic agents status: Secondary | ICD-10-CM | POA: Insufficient documentation

## 2019-09-14 DIAGNOSIS — Z79899 Other long term (current) drug therapy: Secondary | ICD-10-CM | POA: Diagnosis not present

## 2019-09-14 DIAGNOSIS — M546 Pain in thoracic spine: Secondary | ICD-10-CM

## 2019-09-14 DIAGNOSIS — Z888 Allergy status to other drugs, medicaments and biological substances status: Secondary | ICD-10-CM | POA: Diagnosis not present

## 2019-09-14 DIAGNOSIS — Z7951 Long term (current) use of inhaled steroids: Secondary | ICD-10-CM | POA: Insufficient documentation

## 2019-09-14 DIAGNOSIS — J449 Chronic obstructive pulmonary disease, unspecified: Secondary | ICD-10-CM | POA: Diagnosis not present

## 2019-09-14 DIAGNOSIS — E669 Obesity, unspecified: Secondary | ICD-10-CM | POA: Diagnosis not present

## 2019-09-14 DIAGNOSIS — Z886 Allergy status to analgesic agent status: Secondary | ICD-10-CM | POA: Insufficient documentation

## 2019-09-14 DIAGNOSIS — G8929 Other chronic pain: Secondary | ICD-10-CM

## 2019-09-14 DIAGNOSIS — F419 Anxiety disorder, unspecified: Secondary | ICD-10-CM | POA: Insufficient documentation

## 2019-09-14 DIAGNOSIS — G4730 Sleep apnea, unspecified: Secondary | ICD-10-CM | POA: Insufficient documentation

## 2019-09-14 DIAGNOSIS — M542 Cervicalgia: Secondary | ICD-10-CM | POA: Diagnosis not present

## 2019-09-14 DIAGNOSIS — Z880 Allergy status to penicillin: Secondary | ICD-10-CM | POA: Diagnosis not present

## 2019-09-14 HISTORY — DX: Other complications of anesthesia, initial encounter: T88.59XA

## 2019-09-14 HISTORY — DX: Family history of other specified conditions: Z84.89

## 2019-09-14 HISTORY — PX: BREAST REDUCTION SURGERY: SHX8

## 2019-09-14 HISTORY — DX: Sleep apnea, unspecified: G47.30

## 2019-09-14 HISTORY — DX: Gastro-esophageal reflux disease without esophagitis: K21.9

## 2019-09-14 SURGERY — BREAST REDUCTION WITH LIPOSUCTION
Anesthesia: General | Site: Breast | Laterality: Bilateral

## 2019-09-14 MED ORDER — LIDOCAINE 2% (20 MG/ML) 5 ML SYRINGE
INTRAMUSCULAR | Status: AC
  Filled 2019-09-14: qty 5

## 2019-09-14 MED ORDER — EPINEPHRINE PF 1 MG/ML IJ SOLN
INTRAMUSCULAR | Status: AC
  Filled 2019-09-14: qty 3

## 2019-09-14 MED ORDER — PHENYLEPHRINE 40 MCG/ML (10ML) SYRINGE FOR IV PUSH (FOR BLOOD PRESSURE SUPPORT)
PREFILLED_SYRINGE | INTRAVENOUS | Status: DC | PRN
  Administered 2019-09-14: 120 ug via INTRAVENOUS
  Administered 2019-09-14: 80 ug via INTRAVENOUS
  Administered 2019-09-14: 120 ug via INTRAVENOUS
  Administered 2019-09-14: 80 ug via INTRAVENOUS

## 2019-09-14 MED ORDER — SCOPOLAMINE 1 MG/3DAYS TD PT72
1.0000 | MEDICATED_PATCH | TRANSDERMAL | Status: DC
  Administered 2019-09-14: 1 via TRANSDERMAL
  Administered 2019-09-14: 1.5 mg via TRANSDERMAL

## 2019-09-14 MED ORDER — MIDAZOLAM HCL 2 MG/2ML IJ SOLN
INTRAMUSCULAR | Status: AC
  Filled 2019-09-14: qty 2

## 2019-09-14 MED ORDER — ONDANSETRON HCL 4 MG/2ML IJ SOLN
INTRAMUSCULAR | Status: AC
  Filled 2019-09-14: qty 2

## 2019-09-14 MED ORDER — OXYCODONE HCL 5 MG/5ML PO SOLN: 5.0000 mg | Freq: Once | ORAL | Status: DC | PRN

## 2019-09-14 MED ORDER — SCOPOLAMINE 1 MG/3DAYS TD PT72
MEDICATED_PATCH | TRANSDERMAL | Status: AC
  Filled 2019-09-14: qty 1

## 2019-09-14 MED ORDER — DEXAMETHASONE SODIUM PHOSPHATE 10 MG/ML IJ SOLN
INTRAMUSCULAR | Status: AC
  Filled 2019-09-14: qty 1

## 2019-09-14 MED ORDER — SODIUM CHLORIDE 0.9 % IV SOLN: 250.0000 mL | INTRAVENOUS | Status: DC | PRN

## 2019-09-14 MED ORDER — BUPIVACAINE HCL (PF) 0.25 % IJ SOLN
INTRAMUSCULAR | Status: AC
  Filled 2019-09-14: qty 30

## 2019-09-14 MED ORDER — LIDOCAINE HCL 1 % IJ SOLN
INTRAVENOUS | Status: DC | PRN
  Administered 2019-09-14: 500 mL

## 2019-09-14 MED ORDER — LIDOCAINE HCL (PF) 1 % IJ SOLN
INTRAMUSCULAR | Status: AC
  Filled 2019-09-14: qty 60

## 2019-09-14 MED ORDER — ONDANSETRON HCL 4 MG/2ML IJ SOLN
INTRAMUSCULAR | Status: DC | PRN
  Administered 2019-09-14 (×2): 4 mg via INTRAVENOUS

## 2019-09-14 MED ORDER — SODIUM CHLORIDE 0.9% FLUSH: 3.0000 mL | Freq: Two times a day (BID) | INTRAVENOUS | Status: DC

## 2019-09-14 MED ORDER — OXYCODONE HCL 5 MG PO TABS: 5.0000 mg | ORAL_TABLET | ORAL | Status: DC | PRN

## 2019-09-14 MED ORDER — OXYCODONE HCL 5 MG PO TABS: 5.0000 mg | ORAL_TABLET | Freq: Once | ORAL | Status: DC | PRN

## 2019-09-14 MED ORDER — SUGAMMADEX SODIUM 200 MG/2ML IV SOLN
INTRAVENOUS | Status: DC | PRN
  Administered 2019-09-14: 200 mg via INTRAVENOUS

## 2019-09-14 MED ORDER — FENTANYL CITRATE (PF) 100 MCG/2ML IJ SOLN
INTRAMUSCULAR | Status: AC
  Filled 2019-09-14: qty 2

## 2019-09-14 MED ORDER — DEXAMETHASONE SODIUM PHOSPHATE 10 MG/ML IJ SOLN
INTRAMUSCULAR | Status: DC | PRN
  Administered 2019-09-14: 10 mg via INTRAVENOUS

## 2019-09-14 MED ORDER — MIDAZOLAM HCL 2 MG/2ML IJ SOLN: 0.5000 mg | Freq: Once | INTRAMUSCULAR | Status: DC | PRN

## 2019-09-14 MED ORDER — PROMETHAZINE HCL 25 MG/ML IJ SOLN: 6.2500 mg | INTRAMUSCULAR | Status: DC | PRN

## 2019-09-14 MED ORDER — ROCURONIUM BROMIDE 10 MG/ML (PF) SYRINGE
PREFILLED_SYRINGE | INTRAVENOUS | Status: DC | PRN
  Administered 2019-09-14: 60 mg via INTRAVENOUS
  Administered 2019-09-14: 20 mg via INTRAVENOUS

## 2019-09-14 MED ORDER — FENTANYL CITRATE (PF) 100 MCG/2ML IJ SOLN: 25.0000 ug | INTRAMUSCULAR | Status: DC | PRN

## 2019-09-14 MED ORDER — ACETAMINOPHEN 500 MG PO TABS
1000.0000 mg | ORAL_TABLET | Freq: Once | ORAL | Status: AC
  Administered 2019-09-14: 1000 mg via ORAL

## 2019-09-14 MED ORDER — LIDOCAINE 2% (20 MG/ML) 5 ML SYRINGE
INTRAMUSCULAR | Status: DC | PRN
  Administered 2019-09-14: 40 mg via INTRAVENOUS

## 2019-09-14 MED ORDER — LIDOCAINE-EPINEPHRINE 1 %-1:100000 IJ SOLN
INTRAMUSCULAR | Status: DC | PRN
  Administered 2019-09-14: 29 mL

## 2019-09-14 MED ORDER — HYDROMORPHONE HCL 1 MG/ML IJ SOLN: 0.2500 mg | INTRAMUSCULAR | Status: DC | PRN

## 2019-09-14 MED ORDER — EPHEDRINE SULFATE-NACL 50-0.9 MG/10ML-% IV SOSY
PREFILLED_SYRINGE | INTRAVENOUS | Status: DC | PRN
  Administered 2019-09-14: 10 mg via INTRAVENOUS
  Administered 2019-09-14: 15 mg via INTRAVENOUS

## 2019-09-14 MED ORDER — PHENYLEPHRINE HCL-NACL 10-0.9 MG/250ML-% IV SOLN
INTRAVENOUS | Status: DC | PRN
  Administered 2019-09-14: 40 ug/min via INTRAVENOUS

## 2019-09-14 MED ORDER — CLINDAMYCIN PHOSPHATE 900 MG/50ML IV SOLN
INTRAVENOUS | Status: AC
  Filled 2019-09-14: qty 50

## 2019-09-14 MED ORDER — ROCURONIUM BROMIDE 10 MG/ML (PF) SYRINGE
PREFILLED_SYRINGE | INTRAVENOUS | Status: AC
  Filled 2019-09-14: qty 10

## 2019-09-14 MED ORDER — ACETAMINOPHEN 325 MG PO TABS: 650.0000 mg | ORAL_TABLET | ORAL | Status: DC | PRN

## 2019-09-14 MED ORDER — LIDOCAINE-EPINEPHRINE 1 %-1:100000 IJ SOLN
INTRAMUSCULAR | Status: AC
  Filled 2019-09-14: qty 1

## 2019-09-14 MED ORDER — CLINDAMYCIN PHOSPHATE 900 MG/50ML IV SOLN
900.0000 mg | INTRAVENOUS | Status: AC
  Administered 2019-09-14: 900 mg via INTRAVENOUS

## 2019-09-14 MED ORDER — BUPIVACAINE HCL (PF) 0.5 % IJ SOLN
INTRAMUSCULAR | Status: AC
  Filled 2019-09-14: qty 30

## 2019-09-14 MED ORDER — ACETAMINOPHEN 500 MG PO TABS
ORAL_TABLET | ORAL | Status: AC
  Filled 2019-09-14: qty 2

## 2019-09-14 MED ORDER — PROPOFOL 10 MG/ML IV BOLUS
INTRAVENOUS | Status: AC
  Filled 2019-09-14: qty 20

## 2019-09-14 MED ORDER — PROPOFOL 10 MG/ML IV BOLUS
INTRAVENOUS | Status: DC | PRN
  Administered 2019-09-14: 200 mg via INTRAVENOUS

## 2019-09-14 MED ORDER — FENTANYL CITRATE (PF) 100 MCG/2ML IJ SOLN
INTRAMUSCULAR | Status: DC | PRN
  Administered 2019-09-14 (×2): 50 ug via INTRAVENOUS
  Administered 2019-09-14: 100 ug via INTRAVENOUS

## 2019-09-14 MED ORDER — MEPERIDINE HCL 25 MG/ML IJ SOLN: 6.2500 mg | INTRAMUSCULAR | Status: DC | PRN

## 2019-09-14 MED ORDER — CHLORHEXIDINE GLUCONATE CLOTH 2 % EX PADS: 6.0000 | MEDICATED_PAD | Freq: Once | CUTANEOUS | Status: DC

## 2019-09-14 MED ORDER — LACTATED RINGERS IV SOLN: INTRAVENOUS | Status: DC

## 2019-09-14 MED ORDER — ACETAMINOPHEN 325 MG RE SUPP: 650.0000 mg | RECTAL | Status: DC | PRN

## 2019-09-14 MED ORDER — OXYMETAZOLINE HCL 0.05 % NA SOLN
NASAL | Status: AC
  Filled 2019-09-14: qty 30

## 2019-09-14 MED ORDER — SODIUM CHLORIDE 0.9% FLUSH: 3.0000 mL | INTRAVENOUS | Status: DC | PRN

## 2019-09-14 SURGICAL SUPPLY — 71 items
ADH SKN CLS APL DERMABOND .7 (GAUZE/BANDAGES/DRESSINGS) ×3
BAG DECANTER FOR FLEXI CONT (MISCELLANEOUS) IMPLANT
BINDER BREAST LRG (GAUZE/BANDAGES/DRESSINGS) IMPLANT
BINDER BREAST MEDIUM (GAUZE/BANDAGES/DRESSINGS) IMPLANT
BINDER BREAST XLRG (GAUZE/BANDAGES/DRESSINGS) ×3 IMPLANT
BINDER BREAST XXLRG (GAUZE/BANDAGES/DRESSINGS) IMPLANT
BIOPATCH RED 1 DISK 7.0 (GAUZE/BANDAGES/DRESSINGS) IMPLANT
BIOPATCH RED 1IN DISK 7.0MM (GAUZE/BANDAGES/DRESSINGS)
BLADE HEX COATED 2.75 (ELECTRODE) IMPLANT
BLADE KNIFE PERSONA 10 (BLADE) ×6 IMPLANT
BLADE SURG 15 STRL LF DISP TIS (BLADE) ×1 IMPLANT
BLADE SURG 15 STRL SS (BLADE) ×3
BNDG GAUZE ELAST 4 BULKY (GAUZE/BANDAGES/DRESSINGS) IMPLANT
CANISTER SUCT 1200ML W/VALVE (MISCELLANEOUS) ×3 IMPLANT
COVER BACK TABLE 60X90IN (DRAPES) ×3 IMPLANT
COVER MAYO STAND STRL (DRAPES) ×3 IMPLANT
COVER WAND RF STERILE (DRAPES) IMPLANT
DECANTER SPIKE VIAL GLASS SM (MISCELLANEOUS) IMPLANT
DERMABOND ADVANCED (GAUZE/BANDAGES/DRESSINGS) ×6
DERMABOND ADVANCED .7 DNX12 (GAUZE/BANDAGES/DRESSINGS) ×3 IMPLANT
DRAIN CHANNEL 19F RND (DRAIN) IMPLANT
DRAPE LAPAROSCOPIC ABDOMINAL (DRAPES) ×3 IMPLANT
DRSG OPSITE POSTOP 4X6 (GAUZE/BANDAGES/DRESSINGS) ×6 IMPLANT
DRSG PAD ABDOMINAL 8X10 ST (GAUZE/BANDAGES/DRESSINGS) ×12 IMPLANT
ELECT BLADE 4.0 EZ CLEAN MEGAD (MISCELLANEOUS) ×3
ELECT REM PT RETURN 9FT ADLT (ELECTROSURGICAL) ×3
ELECTRODE BLDE 4.0 EZ CLN MEGD (MISCELLANEOUS) ×1 IMPLANT
ELECTRODE REM PT RTRN 9FT ADLT (ELECTROSURGICAL) ×1 IMPLANT
EVACUATOR SILICONE 100CC (DRAIN) IMPLANT
GLOVE BIO SURGEON STRL SZ 6.5 (GLOVE) ×8 IMPLANT
GLOVE BIO SURGEONS STRL SZ 6.5 (GLOVE) ×4
GLOVE BIOGEL M 6.5 STRL (GLOVE) ×6 IMPLANT
GLOVE BIOGEL PI IND STRL 7.0 (GLOVE) ×1 IMPLANT
GLOVE BIOGEL PI INDICATOR 7.0 (GLOVE) ×2
GOWN STRL REUS W/ TWL LRG LVL3 (GOWN DISPOSABLE) ×5 IMPLANT
GOWN STRL REUS W/TWL LRG LVL3 (GOWN DISPOSABLE) ×15
NDL SAFETY ECLIPSE 18X1.5 (NEEDLE) ×1 IMPLANT
NEEDLE HYPO 18GX1.5 SHARP (NEEDLE) ×3
NEEDLE HYPO 25X1 1.5 SAFETY (NEEDLE) ×3 IMPLANT
NS IRRIG 1000ML POUR BTL (IV SOLUTION) ×3 IMPLANT
PACK BASIN DAY SURGERY FS (CUSTOM PROCEDURE TRAY) ×3 IMPLANT
PAD ALCOHOL SWAB (MISCELLANEOUS) IMPLANT
PAD FOAM SILICONE BACKED (GAUZE/BANDAGES/DRESSINGS) IMPLANT
PENCIL SMOKE EVACUATOR (MISCELLANEOUS) ×3 IMPLANT
SLEEVE SCD COMPRESS KNEE MED (MISCELLANEOUS) ×3 IMPLANT
SPONGE LAP 18X18 RF (DISPOSABLE) ×9 IMPLANT
STRIP SUTURE WOUND CLOSURE 1/2 (MISCELLANEOUS) ×6 IMPLANT
SUT MNCRL AB 4-0 PS2 18 (SUTURE) ×27 IMPLANT
SUT MON AB 3-0 SH 27 (SUTURE) ×18
SUT MON AB 3-0 SH27 (SUTURE) ×6 IMPLANT
SUT MON AB 5-0 PS2 18 (SUTURE) ×15 IMPLANT
SUT PDS 3-0 CT2 (SUTURE)
SUT PDS AB 2-0 CT2 27 (SUTURE) IMPLANT
SUT PDS II 3-0 CT2 27 ABS (SUTURE) IMPLANT
SUT SILK 3 0 PS 1 (SUTURE) IMPLANT
SUT VIC AB 3-0 SH 27 (SUTURE)
SUT VIC AB 3-0 SH 27X BRD (SUTURE) IMPLANT
SUT VICRYL 4-0 PS2 18IN ABS (SUTURE) IMPLANT
SYR 3ML 23GX1 SAFETY (SYRINGE) ×3 IMPLANT
SYR 50ML LL SCALE MARK (SYRINGE) ×3 IMPLANT
SYR BULB IRRIG 60ML STRL (SYRINGE) IMPLANT
SYR CONTROL 10ML LL (SYRINGE) ×3 IMPLANT
TAPE MEASURE VINYL STERILE (MISCELLANEOUS) IMPLANT
TOWEL GREEN STERILE FF (TOWEL DISPOSABLE) ×6 IMPLANT
TRAY DSU PREP LF (CUSTOM PROCEDURE TRAY) ×3 IMPLANT
TUBE CONNECTING 20'X1/4 (TUBING) ×1
TUBE CONNECTING 20X1/4 (TUBING) ×2 IMPLANT
TUBING INFILTRATION IT-10001 (TUBING) ×3 IMPLANT
TUBING SET GRADUATE ASPIR 12FT (MISCELLANEOUS) ×3 IMPLANT
UNDERPAD 30X36 HEAVY ABSORB (UNDERPADS AND DIAPERS) ×6 IMPLANT
YANKAUER SUCT BULB TIP NO VENT (SUCTIONS) ×3 IMPLANT

## 2019-09-14 NOTE — Transfer of Care (Signed)
Immediate Anesthesia Transfer of Care Note  Patient: Nancy Barnes  Procedure(s) Performed: BILATERAL BREAST REDUCTION WITH LIPOSUCTION (Bilateral Breast)  Patient Location: PACU  Anesthesia Type:General  Level of Consciousness: awake and drowsy  Airway & Oxygen Therapy: Patient Spontanous Breathing and Patient connected to face mask oxygen  Post-op Assessment: Report given to RN and Post -op Vital signs reviewed and stable  Post vital signs: Reviewed and stable  Last Vitals:  Vitals Value Taken Time  BP 141/97 09/14/19 1107  Temp    Pulse 89 09/14/19 1113  Resp 15 09/14/19 1113  SpO2 95 % 09/14/19 1113  Vitals shown include unvalidated device data.  Last Pain:  Vitals:   09/14/19 1107  TempSrc:   PainSc: (P) Asleep         Complications: No complications documented.

## 2019-09-14 NOTE — Anesthesia Postprocedure Evaluation (Addendum)
Anesthesia Post Note  Patient: Nancy Barnes  Procedure(s) Performed: BILATERAL BREAST REDUCTION WITH LIPOSUCTION (Bilateral Breast)     Patient location during evaluation: PACU Anesthesia Type: General Level of consciousness: awake and alert, patient cooperative and oriented Pain management: pain level controlled Vital Signs Assessment: post-procedure vital signs reviewed and stable Respiratory status: spontaneous breathing, nonlabored ventilation and respiratory function stable Cardiovascular status: blood pressure returned to baseline and stable Postop Assessment: no apparent nausea or vomiting and adequate PO intake Anesthetic complications: no   No complications documented.  Last Vitals:  Vitals:   09/14/19 1200 09/14/19 1246  BP: 128/87 (!) 131/95  Pulse: 99 94  Resp: 20 15  Temp:  37.1 C  SpO2: 94% 97%    Last Pain:  Vitals:   09/14/19 1246  TempSrc:   PainSc: 1                  Yatziri Wainwright,E. Alister Staver

## 2019-09-14 NOTE — Anesthesia Procedure Notes (Signed)
Procedure Name: Intubation Date/Time: 09/14/2019 8:36 AM Performed by: [place] Jasia Hiltunen C, CRNA Pre-anesthesia Checklist: Patient identified, Emergency Drugs available, Suction available and Patient being monitored Patient Re-evaluated:Patient Re-evaluated prior to induction Oxygen Delivery Method: Circle system utilized Preoxygenation: Pre-oxygenation with 100% oxygen Induction Type: IV induction Ventilation: Mask ventilation without difficulty Laryngoscope Size: Mac and 3 Grade View: Grade I Tube type: Oral Tube size: 7.0 mm Number of attempts: 1 Airway Equipment and Method: Stylet and Oral airway Placement Confirmation: ETT inserted through vocal cords under direct vision,  positive ETCO2 and breath sounds checked- equal and bilateral Secured at: 20 cm Tube secured with: Tape Dental Injury: Teeth and Oropharynx as per pre-operative assessment

## 2019-09-14 NOTE — Interval H&P Note (Signed)
History and Physical Interval Note:  09/14/2019 8:05 AM  Nancy Barnes  has presented today for surgery, with the diagnosis of mammary hypertrophy.  The various methods of treatment have been discussed with the patient and family. After consideration of risks, benefits and other options for treatment, the patient has consented to  Procedure(s) with comments: BREAST REDUCTION WITH LIPOSUCTION (Bilateral) - 3 hours as a surgical intervention.  The patient's history has been reviewed, patient examined, no change in status, stable for surgery.  I have reviewed the patient's chart and labs.  Questions were answered to the patient's satisfaction.     Alena Bills Laquasia Pincus

## 2019-09-14 NOTE — Op Note (Signed)
Breast Reduction Op note:    DATE OF PROCEDURE: 09/14/2019  LOCATION: [facility]  SURGEON: Alan Ripper Sanger Goldman Birchall, DO  ASSISTANT: Joni Fears, PA  PREOPERATIVE DIAGNOSIS 1. Macromastia 2. Neck Pain 3. Back Pain  POSTOPERATIVE DIAGNOSIS 1. Macromastia 2. Neck Pain 3. Back Pain  PROCEDURES 1. Bilateral breast reduction.  Right reduction 790 g, Left reduction 644 g  COMPLICATIONS: None.  DRAINS: none  INDICATIONS FOR PROCEDURE Nancy Barnes is a 49 y.o. year-old female born on February 22, 1970,with a history of symptomatic macromastia with concominant back pain, neck pain, shoulder grooving from her bra.   MRN: [id number]  CONSENT Informed consent was obtained directly from the patient. The risks, benefits and alternatives were fully discussed. Specific risks including but not limited to bleeding, infection, hematoma, seroma, scarring, pain, nipple necrosis, asymmetry, poor cosmetic results, and need for further surgery were discussed. The patient had ample opportunity to have her questions answered to her satisfaction.  DESCRIPTION OF PROCEDURE  Patient was brought into the operating room and placed in a supine position.  SCDs were placed and appropriate padding was performed.  Antibiotics were given. The patient underwent general anesthesia and the chest was prepped and draped in a sterile fashion.  A timeout was performed and all information was confirmed to be correct. Tumescent was placed in the lateral breasts.  Right side: Preoperative markings were confirmed.  Incision lines were injected with local with epinephrine.  Liposuction was performed in the lateral breast. After waiting for vasoconstriction, the marked lines were incised.  A Wise-pattern superomedial breast reduction was performed by de-epithelializing the pedicle, using bovie to create the superomedial pedicle, and removing breast tissue from the superior, lateral, and inferior portions of the  breast.  Care was taken to not undermine the breast pedicle. Hemostasis was achieved.  The nipple was gently rotated into position and the soft tissue closed with 4-0 Monocryl.   The pocket was irrigated and hemostasis confirmed.  The deep tissues were approximated with 3-0 Monocryl sutures and the skin was closed with deep dermal and subcuticular 4-0 Monocryl sutures.  The nipple and skin flaps had good capillary refill at the end of the procedure.    Left side: Preoperative markings were confirmed.  Incision lines were injected with local with epinephrine.   Liposuction was performed in the lateral breast. After waiting for vasoconstriction, the marked lines were incised.  A Wise-pattern superomedial breast reduction was performed by de-epithelializing the pedicle, using bovie to create the superomedial pedicle, and removing breast tissue from the superior, lateral, and inferior portions of the breast.  Care was taken to not undermine the breast pedicle. Hemostasis was achieved.  The nipple was gently rotated into position and the soft tissue was closed with 4-0 Monocryl.  The patient was sat upright and size and shape symmetry was confirmed.  The pocket was irrigated and hemostasis confirmed.  The deep tissues were approximated with 3-0 Monocryl sutures and the skin was closed with deep dermal and subcuticular 4-0 Monocryl sutures.  Dermabond was applied.  A breast binder and ABDs were placed.  The nipple and skin flaps had good capillary refill at the end of the procedure.  The patient tolerated the procedure well. The patient was allowed to wake from anesthesia and taken to the recovery room in satisfactory condition.  The advanced practice practitioner (APP) assisted throughout the case.  The APP was essential in retraction and counter traction when needed to make the case progress smoothly.  This  retraction and assistance made it possible to see the tissue plans for the procedure.  The assistance was  needed for blood control, tissue re-approximation and assisted with closure of the incision site.

## 2019-09-14 NOTE — Discharge Instructions (Addendum)
INSTRUCTIONS FOR AFTER SURGERY   You will likely have some questions about what to expect following your operation.  The following information will help you and your family understand what to expect when you are discharged from the hospital.  Following these guidelines will help ensure a smooth recovery and reduce risks of complications.  Postoperative instructions include information on: diet, wound care, medications and physical activity.  AFTER SURGERY Expect to go home after the procedure.  In some cases, you may need to spend one night in the hospital for observation.  DIET This surgery does not require a specific diet.  However, I have to mention that the healthier you eat the better your body can start healing. It is important to increasing your protein intake.  This means limiting the foods with added sugar.  Focus on fruits and vegetables and some meat. It is very important to drink water after your surgery.  If your urine is bright yellow, then it is concentrated, and you need to drink more water.  As a general rule after surgery, you should have 8 ounces of water every hour while awake.  If you find you are persistently nauseated or unable to take in liquids let us know.  NO TOBACCO USE or EXPOSURE.  This will slow your healing process and increase the risk of a wound.  WOUND CARE If you don't have a drain: You can shower the day after surgery.  Use fragrance free soap.  Dial, [place], [place] and Cetaphil are usually mild on the skin.    If you have steri-strips / tape directly attached to your skin leave them in place. It is OK to get these wet.  No baths, pools or hot tubs for two weeks. We close your incision to leave the smallest and best-looking scar. No ointment or creams on your incisions until given the go ahead.  Especially not Neosporin (Too many skin reactions with this one).  A few weeks after surgery you can use Mederma and start massaging the scar. We ask you to wear your binder or  sports bra for the first 6 weeks around the clock, including while sleeping. This provides added comfort and helps reduce the fluid accumulation at the surgery site.  ACTIVITY No heavy lifting until cleared by the doctor.  It is OK to walk and climb stairs. In fact, moving your legs is very important to decrease your risk of a blood clot.  It will also help keep you from getting deconditioned.  Every 1 to 2 hours get up and walk for 5 minutes. This will help with a quicker recovery back to normal.  Let pain be your guide so you don't do too much.  NO, you cannot do the spring cleaning and don't plan on taking care of anyone else.  This is your time for TLC.   WORK Everyone returns to work at different times. As a rough guide, most people take at least 1 - 2 weeks off prior to returning to work. If you need documentation for your job, bring the forms to your postoperative follow up visit.  DRIVING Arrange for someone to bring you home from the hospital.  You may be able to drive a few days after surgery but not while taking any narcotics or valium.  BOWEL MOVEMENTS Constipation can occur after anesthesia and while taking pain medication.  It is important to stay ahead for your comfort.  We recommend taking Milk of Magnesia (2 tablespoons; twice a day)  while taking the pain pills.  SEROMA This is fluid your body tried to put in the surgical site.  This is normal but if it creates excessive pain and swelling let us know.  It usually decreases in a few weeks.  MEDICATIONS and PAIN CONTROL At your preoperative visit for you history and physical you were given the following medications: 1. An antibiotic: Start this medication when you get home and take according to the instructions on the bottle. 2. Zofran 4 mg:  This is to treat nausea and vomiting.  You can take this every 6 hours as needed and only if needed. 3. Norco (hydrocodone/acetaminophen) 5/325 mg:  This is only to be used after you have  taken the motrin or the tylenol. Every 8 hours as needed. Over the counter Medication to take: 4. Ibuprofen (Motrin) 600 mg:  Take this every 6 hours.  If you have additional pain then take 500 mg of the tylenol.  Only take the Norco after you have tried these two. 5. Miralax or stool softener of choice: Take this according to the bottle if you take the Norco.  WHEN TO CALL Call your surgeon's office if any of the following occur: . Fever 101 degrees F or greater . Excessive bleeding or fluid from the incision site. . Pain that increases over time without aid from the medications . Redness, warmth, or pus draining from incision sites . Persistent nausea or inability to take in liquids . Severe misshapen area that underwent the operation.  No Tylenol until 1:56 pm   Post Anesthesia Home Care Instructions  Activity: Get plenty of rest for the remainder of the day. A responsible individual must stay with you for 24 hours following the procedure.  For the next 24 hours, DO NOT: -Drive a car -Advertising copywriter -Drink alcoholic beverages -Take any medication unless instructed by your physician -Make any legal decisions or sign important papers.  Meals: Start with liquid foods such as gelatin or soup. Progress to regular foods as tolerated. Avoid greasy, spicy, heavy foods. If nausea and/or vomiting occur, drink only clear liquids until the nausea and/or vomiting subsides. Call your physician if vomiting continues.  Special Instructions/Symptoms: Your throat may feel dry or sore from the anesthesia or the breathing tube placed in your throat during surgery. If this causes discomfort, gargle with warm salt water. The discomfort should disappear within 24 hours.  If you had a scopolamine patch placed behind your ear for the management of post- operative nausea and/or vomiting:  1. The medication in the patch is effective for 72 hours, after which it should be removed.  Wrap patch in a  tissue and discard in the trash. Wash hands thoroughly with soap and water. 2. You may remove the patch earlier than 72 hours if you experience unpleasant side effects which may include dry mouth, dizziness or visual disturbances. 3. Avoid touching the patch. Wash your hands with soap and water after contact with the patch.

## 2019-09-15 ENCOUNTER — Encounter (HOSPITAL_BASED_OUTPATIENT_CLINIC_OR_DEPARTMENT_OTHER): Payer: Self-pay | Admitting: Plastic Surgery

## 2019-09-15 LAB — SURGICAL PATHOLOGY

## 2019-09-23 ENCOUNTER — Encounter: Payer: Self-pay | Admitting: Plastic Surgery

## 2019-09-23 ENCOUNTER — Telehealth: Payer: Self-pay | Admitting: Plastic Surgery

## 2019-09-23 ENCOUNTER — Other Ambulatory Visit (INDEPENDENT_AMBULATORY_CARE_PROVIDER_SITE_OTHER): Payer: 59 | Admitting: Plastic Surgery

## 2019-09-23 ENCOUNTER — Other Ambulatory Visit: Payer: Self-pay

## 2019-09-23 ENCOUNTER — Encounter: Payer: Self-pay | Admitting: Internal Medicine

## 2019-09-23 ENCOUNTER — Ambulatory Visit (INDEPENDENT_AMBULATORY_CARE_PROVIDER_SITE_OTHER): Payer: 59 | Admitting: Plastic Surgery

## 2019-09-23 ENCOUNTER — Ambulatory Visit (INDEPENDENT_AMBULATORY_CARE_PROVIDER_SITE_OTHER): Payer: 59 | Admitting: Internal Medicine

## 2019-09-23 VITALS — BP 120/88 | HR 81 | Temp 98.4°F

## 2019-09-23 DIAGNOSIS — J45909 Unspecified asthma, uncomplicated: Secondary | ICD-10-CM | POA: Diagnosis not present

## 2019-09-23 DIAGNOSIS — G4733 Obstructive sleep apnea (adult) (pediatric): Secondary | ICD-10-CM

## 2019-09-23 DIAGNOSIS — Z23 Encounter for immunization: Secondary | ICD-10-CM | POA: Diagnosis not present

## 2019-09-23 DIAGNOSIS — N62 Hypertrophy of breast: Secondary | ICD-10-CM

## 2019-09-23 MED ORDER — BREO ELLIPTA 200-25 MCG/INH IN AEPB
1.0000 | INHALATION_SPRAY | Freq: Every day | RESPIRATORY_TRACT | 5 refills | Status: DC
Start: 2019-09-23 — End: 2019-11-04

## 2019-09-23 MED ORDER — BREO ELLIPTA 100-25 MCG/INH IN AEPB: 1.0000 | INHALATION_SPRAY | Freq: Every morning | RESPIRATORY_TRACT | 4 refills | Status: DC

## 2019-09-23 MED ORDER — DOXYCYCLINE HYCLATE 100 MG PO TABS: 100.0000 mg | ORAL_TABLET | Freq: Two times a day (BID) | ORAL | 0 refills | Status: AC

## 2019-09-23 MED ORDER — BREO ELLIPTA 200-25 MCG/INH IN AEPB: 1.0000 | INHALATION_SPRAY | Freq: Every day | RESPIRATORY_TRACT | 0 refills | Status: DC

## 2019-09-23 NOTE — Telephone Encounter (Signed)
Called Nancy Barnes to advise that her FMLA paperwork shows she will be released on 09/28/19. Her job is very physical in nature. 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 Please call her to advise what she should do with regards to work release and restrictions.

## 2019-09-23 NOTE — Progress Notes (Signed)
The patient is a 49 year old female here for follow-up on her bilateral breast reduction.  She has significant bruising as expected.  There is no signs of infection.  I was able to aspirate 30 cc of serosanguineous fluid from the right breast.  There was a slight bit of redness at the 9 o'clock position of the areola juncture.  I am hoping that this will help resolve that.  There did not appear to be any redness on the left side.  I Minna wait to remove the dressings until she comes back.  If she wants to remove them in the shower she certainly can otherwise we will do it when she comes for her follow-up.  I like to leave them on a little bit longer for extra security.  Overall the patient is doing very well.

## 2019-09-23 NOTE — Progress Notes (Signed)
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 Medications (nonsteroidal anti-inflammatory drugs, Toradol) caused facial caused facial swelling/angioedema. Foods-pecan, walnut caused itchiness and sores in mouth.             Shrimp, crab caused chest tightness 3 years ago. Seasonal allergic rhinitis- pollens and cats. Latex-rash Allergy Skin Test- 05/19/12- POS especially grass, tree, dust mite, cockroach. 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 Now comes for sleep evaluation courtesy of Dr Hassan Rowan..  Medical problem list includes  Asthma, Allergic Rhinitis, Food Allergy,  Anxiety/ Depression,  NPSG 10/24/13- AHI 3.5/ hr, desaturation to 93%, body weight 176 lbs (ordered by Dr [name] Pane) Had 2 Moderna Covax Epworth score 13 Body weight today 180 lbs Snores very loudly per boyfriend. If sleeps on back will wake gasping. Reports daytime tiredness, asleep is sits quietly, but denies problem driving. Aware of waking at night in uncomfortable positions because can't breathe lying on back. No sleep med. Little caffeine. ENT surgery- none. Wakes occ aware of wheezing, postnasal drip. Breo worked very well but too expensive- didn't bring her paperwork for manufacturer assistance. Doesn't remember Symbicort.  Saw allergist once at [facility].  Pending evaluation for reduction mammoplasty.   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   ROS-see HPI   + = positive Constitutional:   No-   weight loss, night sweats, fevers, chills, fatigue, lassitude. HEENT:   No-  headaches, difficulty swallowing, tooth/dental problems, sore throat,      +sneezing, itching, ear ache, +nasal congestion, post nasal drip,  CV:  No-   chest pain, orthopnea, PND, swelling in lower extremities, anasarca, dizziness, +palpitations Resp: + shortness of breath with exertion or at rest.             + productive cough,  No non-productive cough,  No- coughing up of blood.              No-   change in color of mucus.  + wheezing.   Skin: No-   rash or lesions. GI:  + heartburn, indigestion, abdominal pain, nausea, vomiting,  GU: . MS:  No-   joint pain or swelling.   Neuro-     nothing unusual Psych:  No- change in mood or affect. + depression or +anxiety.  No memory loss.  OBJ- Physical Exam General- Alert, Oriented, Affect-appropriate, Distress- none acute Skin- rash-none, lesions- none, excoriation- none Lymphadenopathy- none Head- atraumatic            Eyes- Gross vision intact, PERRLA, conjunctivae and secretions clear            Ears- Hearing, canals-normal            Nose- clear, no-Septal dev, mucus, polyps, erosion, perforation             Throat- Mallampati II , mucosa clear ,  drainage- none, tonsils- atrophic Neck- flexible , trachea midline, no stridor , thyroid nl, carotid no bruit Chest - symmetrical excursion , unlabored           Heart/CV- RRR , no murmur , no gallop  , no rub, nl s1 s2                           - JVD- none , edema- none, stasis changes- none, varices- none           Lung- clear to P&A, wheeze- none, cough+dry , dullness-none, rub- none           Chest wall-  Abd-  Br/ Gen/ Rectal- Not done, not  indicated Extrem- cyanosis- none, clubbing, none, atrophy- none, strength- nl Neuro- grossly intact to observation

## 2019-09-23 NOTE — Patient Instructions (Signed)
We can continue CPAP auto 5-15  Get your Moderna booster when you can  Order- Flu vax- standard  Please cal if we can help

## 2019-09-23 NOTE — Telephone Encounter (Signed)
Called and spoke with the patient and informed her that we spoke with [facility] regarding her message.  She would like for her to use vaseline,massage the areas that are firm, and she will send in a ATB to [facility] in [place].    Informed her to make sure she's using compression 24:7 and no heavy lifting.  Patient verbalized understanding and agreed.//AB/CMA

## 2019-09-23 NOTE — Telephone Encounter (Signed)
Patient Called to say she drained 40cc off of right breast and Dr. Ulice Bold noticed it was red around the nipple. It has gotten redder and hard with more pain now. Please call patient to advise. [phone]

## 2019-09-26 ENCOUNTER — Ambulatory Visit (INDEPENDENT_AMBULATORY_CARE_PROVIDER_SITE_OTHER): Payer: 59 | Admitting: Plastic Surgery

## 2019-09-26 ENCOUNTER — Other Ambulatory Visit: Payer: Self-pay

## 2019-09-26 ENCOUNTER — Encounter: Payer: Self-pay | Admitting: Plastic Surgery

## 2019-09-26 ENCOUNTER — Ambulatory Visit: Payer: 59 | Admitting: Plastic Surgery

## 2019-09-26 VITALS — BP 135/90 | HR 61 | Temp 98.5°F

## 2019-09-26 DIAGNOSIS — N62 Hypertrophy of breast: Secondary | ICD-10-CM

## 2019-09-26 NOTE — Telephone Encounter (Signed)
Called and spoke with the patient regarding the message below.  Informed the patient that I spoke with [facility] and she stated that once they fill the FMLA out it's up to the her employer to what restrictions they have for them to do.  Patient verbalized understanding and agreed.//AB/CMA

## 2019-09-26 NOTE — Progress Notes (Signed)
Patient presents about 1 week out from bilateral breast reduction.  She had some concerns over some swelling and erythema on the right side.  She called over the weekend and I offered to see her today to check on things.  She did previously have a small seroma evacuated on that side with needle aspiration.  She is currently on doxycycline.  On exam there is a little bit of erythema superior and lateral to the nipple areolar complex.  There is not a lot of firmness or obvious subcutaneous fluid.  All the incisions are healing fine.  I attempted aspiration with an 18-gauge needle and was unable to get much fluid back.  I suspect that she will go on to heal well with time.  She has an appointment for a little over a week from now and I asked her to keep that appointment and if anything were to change she can come back sooner.  All her questions were answered and we will plan to see her back at that time.

## 2019-10-05 NOTE — Assessment & Plan Note (Signed)
Mild intermittent uncomplicated Plan- keep meds available, watching through season change.

## 2019-10-05 NOTE — Assessment & Plan Note (Signed)
Benefits from CPAP Plan- continue auto 5-15, reviewed goals

## 2019-10-07 ENCOUNTER — Ambulatory Visit (INDEPENDENT_AMBULATORY_CARE_PROVIDER_SITE_OTHER): Payer: 59 | Admitting: Surgical

## 2019-10-07 ENCOUNTER — Encounter: Payer: 59 | Admitting: Plastic Surgery

## 2019-10-07 ENCOUNTER — Other Ambulatory Visit: Payer: Self-pay

## 2019-10-07 ENCOUNTER — Encounter: Payer: Self-pay | Admitting: Surgical

## 2019-10-07 VITALS — BP 139/94 | HR 76 | Temp 99.0°F

## 2019-10-07 DIAGNOSIS — Z9889 Other specified postprocedural states: Secondary | ICD-10-CM

## 2019-10-07 DIAGNOSIS — N62 Hypertrophy of breast: Secondary | ICD-10-CM

## 2019-10-07 NOTE — Progress Notes (Signed)
Patient is here for follow-up after bilateral breast reduction on 09/14/2019 with Dr. Ulice Bold.  Patient reports she is doing a lot better today.  She reports that she has noticed some itching, but no rash.  No fevers, chills, nausea, vomiting.  Redness of her right breast has resolved.  Chaperone present on exam On exam bilateral NAC's with good color, good capillary refill. Incisions intact without any wounds noted throughout.  Bilateral breasts are soft, symmetric, no erythema.  No fluid wave noted with palpation.  No rash noted on exam.  Recommend continue to wear compressive garment, avoid strenuous activity. Monocryl suture knots removed around right and left NAC today.  Patient tolerated this fine. Recommend following up in 3 weeks for reevaluation.

## 2019-10-13 ENCOUNTER — Ambulatory Visit (INDEPENDENT_AMBULATORY_CARE_PROVIDER_SITE_OTHER): Payer: 59 | Admitting: Psychiatry

## 2019-10-13 ENCOUNTER — Other Ambulatory Visit: Payer: Self-pay

## 2019-10-13 ENCOUNTER — Encounter (HOSPITAL_COMMUNITY): Payer: Self-pay | Admitting: Psychiatry

## 2019-10-13 DIAGNOSIS — F902 Attention-deficit hyperactivity disorder, combined type: Secondary | ICD-10-CM

## 2019-10-13 DIAGNOSIS — F332 Major depressive disorder, recurrent severe without psychotic features: Secondary | ICD-10-CM

## 2019-10-13 DIAGNOSIS — F411 Generalized anxiety disorder: Secondary | ICD-10-CM

## 2019-10-13 DIAGNOSIS — F99 Mental disorder, not otherwise specified: Secondary | ICD-10-CM

## 2019-10-13 DIAGNOSIS — F5105 Insomnia due to other mental disorder: Secondary | ICD-10-CM | POA: Diagnosis not present

## 2019-10-13 MED ORDER — HYDROXYZINE HCL 10 MG PO TABS: 10.0000 mg | ORAL_TABLET | Freq: Three times a day (TID) | ORAL | 0 refills | Status: DC | PRN

## 2019-10-13 MED ORDER — VENLAFAXINE HCL ER 75 MG PO CP24: 225.0000 mg | ORAL_CAPSULE | Freq: Every day | ORAL | 0 refills | Status: DC

## 2019-10-13 NOTE — Progress Notes (Signed)
Virtual Visit via Telephone Note  I connected with [name] on 10/13/19 at 11:00 AM EDT by telephone and verified that I am speaking with the correct person using two identifiers.  Location: Patient: home Provider: office   I discussed the limitations, risks, security and privacy concerns of performing an evaluation and management service by telephone and the availability of in person appointments. I also discussed with the patient that there may be a patient responsible charge related to this service. The patient expressed understanding and agreed to proceed.   History of Present Illness: Markell states she has recently been told by her boss and daughter that she likely has ADHD. They both states she is unable to stay focused. Eleonore's daughter states she is unable to complete a task before moving on to several other tasks. [name] notices she is easily distracted and at work she will end up doing something that is not part of her job without finishing her actual work. She will often finish her work day without completing necessary paperwork. Beverley feels she is easily overwhelmed. She states she has been having these symptoms for many years but thought it was part of her depression. Mom was reviewing Shuntae's old school report cards which shows consistent comments about her poor concentration and unable to sit still. Her depression and anxiety are stable. She is sleeping well and is using her CPAP. She denies SI/HI.      Observations/Objective:  General Appearance: unable to assess  Eye Contact:  unable to assess  Speech:  Clear and Coherent and Normal Rate  Volume:  Normal  Mood:  Euthymic  Affect:  Full Range  Thought Process:  Goal Directed, Linear and Descriptions of Associations: Intact  Orientation:  Full (Time, Place, and Person)  Thought Content:  Logical  Suicidal Thoughts:  No  Homicidal Thoughts:  No  Memory:  Immediate;   Good  Judgement:  Good  Insight:  Good   Psychomotor Activity: unable to assess  Concentration:  Concentration: Fair  Recall:  [employer] of Knowledge:  Good  Language:  Good  Akathisia:  unable to assess  Handed:  Right  AIMS (if indicated):     Assets:  Communication Skills Desire for Improvement Financial Resources/Insurance Housing Resilience Social Support Talents/Skills Transportation Vocational/Educational  ADL's:  unable to assess  Cognition:  WNL  Sleep:         Assessment and Plan: ADHD- combined type; MDD- recurrent, severe without psychotic features; GAD; Insomnia  Effexor XR 225mg  po qD  Vistaril 10mg  po TID prn anxiety  D/c Cytomel   Pt is hesitant to try any medication (discussed Ritalin and Strattera) and will research.  Follow Up Instructions: In 2-3 months or sooner if needed   I discussed the assessment and treatment plan with the patient. The patient was provided an opportunity to ask questions and all were answered. The patient agreed with the plan and demonstrated an understanding of the instructions.   The patient was advised to call back or seek an in-person evaluation if the symptoms worsen or if the condition fails to improve as anticipated.  I provided 20 minutes of non-face-to-face time during this encounter.   , MD

## 2019-10-20 ENCOUNTER — Encounter: Payer: Self-pay | Admitting: Plastic Surgery

## 2019-10-20 ENCOUNTER — Telehealth: Payer: Self-pay | Admitting: Surgical

## 2019-10-20 NOTE — Telephone Encounter (Signed)
Patient called because she thinks the suture site on the right areola may be infected. It's been hurting off and on and is red. Please call patient to advise if she needs to come in before her visit on 10/15. Patient will be sending picture through mychart.

## 2019-10-24 NOTE — Telephone Encounter (Signed)
Please refer to Dr. Thomos Lemons note to pt via "mychart" on 10/20/19

## 2019-10-28 ENCOUNTER — Other Ambulatory Visit: Payer: Self-pay

## 2019-10-28 ENCOUNTER — Encounter: Payer: Self-pay | Admitting: Surgical

## 2019-10-28 ENCOUNTER — Ambulatory Visit (INDEPENDENT_AMBULATORY_CARE_PROVIDER_SITE_OTHER): Payer: 59 | Admitting: Surgical

## 2019-10-28 VITALS — BP 155/110 | HR 88 | Temp 97.7°F

## 2019-10-28 DIAGNOSIS — Z9889 Other specified postprocedural states: Secondary | ICD-10-CM

## 2019-10-28 NOTE — Progress Notes (Signed)
Patient is a 49 year old female here for follow-up after bilateral breast reduction on 09/14/2019 with Dr. Ulice Bold.  She is 6 weeks postop.   In regards to healing she is overall doing well.  She feels as if she has noticed some increased fullness in the left axillary region compared to the right.  She reports that she also feels as if her breasts are larger than they were postoperatively and she is concerned that they will status larger or become larger.  She states that she ideally would be smaller than she currently has.  Chaperone present on exam On exam bilateral breast incisions are intact, bilateral NAC's are viable, no bruising noted.  No fluid noted with palpation.  No rash noted.  Everything appears to be healing as expected.  Recommend continue to wear compressive garment, no longer necessary to wear at night. Avoid bra with underwire Patient can return to work with no restrictions, lifting restrictions limited by pain.  We had a thorough discussion today in regards to the size of her breast, she feels as if they are larger than she would like and ideally she would like to have more tissue removed.  I did discuss with her that this is something that is possible, however we would want at least 6 months minimum of healing.  Patient agreed and understood, we will schedule a follow-up with Dr. Ulice Bold to further discuss in 6 months and to reevaluate.  Pictures were obtained of the patient and placed in the chart with the patient's or guardian's permission.

## 2019-10-30 ENCOUNTER — Other Ambulatory Visit: Payer: Self-pay | Admitting: Family Medicine

## 2019-11-04 ENCOUNTER — Other Ambulatory Visit: Payer: Self-pay | Admitting: Internal Medicine

## 2019-11-24 ENCOUNTER — Other Ambulatory Visit: Payer: Self-pay | Admitting: Family Medicine

## 2019-11-24 DIAGNOSIS — J4540 Moderate persistent asthma, uncomplicated: Secondary | ICD-10-CM

## 2019-12-02 ENCOUNTER — Ambulatory Visit: Payer: 59 | Admitting: Family Medicine

## 2019-12-12 ENCOUNTER — Other Ambulatory Visit: Payer: Self-pay

## 2019-12-12 ENCOUNTER — Encounter: Payer: Self-pay | Admitting: Family Medicine

## 2019-12-12 ENCOUNTER — Ambulatory Visit (INDEPENDENT_AMBULATORY_CARE_PROVIDER_SITE_OTHER): Payer: 59 | Admitting: Family Medicine

## 2019-12-12 VITALS — BP 140/100 | HR 83 | Temp 98.2°F | Ht 64.5 in | Wt 179.4 lb

## 2019-12-12 DIAGNOSIS — J45909 Unspecified asthma, uncomplicated: Secondary | ICD-10-CM | POA: Diagnosis not present

## 2019-12-12 DIAGNOSIS — I10 Essential (primary) hypertension: Secondary | ICD-10-CM | POA: Diagnosis not present

## 2019-12-12 DIAGNOSIS — J3089 Other allergic rhinitis: Secondary | ICD-10-CM

## 2019-12-12 DIAGNOSIS — E876 Hypokalemia: Secondary | ICD-10-CM

## 2019-12-12 DIAGNOSIS — E559 Vitamin D deficiency, unspecified: Secondary | ICD-10-CM

## 2019-12-12 DIAGNOSIS — F411 Generalized anxiety disorder: Secondary | ICD-10-CM

## 2019-12-12 DIAGNOSIS — G4733 Obstructive sleep apnea (adult) (pediatric): Secondary | ICD-10-CM | POA: Diagnosis not present

## 2019-12-12 DIAGNOSIS — E611 Iron deficiency: Secondary | ICD-10-CM

## 2019-12-12 DIAGNOSIS — R748 Abnormal levels of other serum enzymes: Secondary | ICD-10-CM

## 2019-12-12 DIAGNOSIS — J302 Other seasonal allergic rhinitis: Secondary | ICD-10-CM

## 2019-12-12 LAB — COMPREHENSIVE METABOLIC PANEL
ALT: 16 U/L (ref 0–35)
AST: 13 U/L (ref 0–37)
Albumin: 4.4 g/dL (ref 3.5–5.2)
Alkaline Phosphatase: 119 U/L — ABNORMAL HIGH (ref 39–117)
BUN: 9 mg/dL (ref 6–23)
CO2: 31 mEq/L (ref 19–32)
Calcium: 9.1 mg/dL (ref 8.4–10.5)
Chloride: 103 mEq/L (ref 96–112)
Creatinine, Ser: 0.72 mg/dL (ref 0.40–1.20)
GFR: 98.95 mL/min (ref >60.00)
Glucose, Bld: 99 mg/dL (ref 70–99)
Potassium: 3.8 mEq/L (ref 3.5–5.1)
Sodium: 140 mEq/L (ref 135–145)
Total Bilirubin: 0.4 mg/dL (ref 0.2–1.2)
Total Protein: 7.3 g/dL (ref 6.0–8.3)

## 2019-12-12 LAB — CBC WITH DIFFERENTIAL/PLATELET
Basophils Absolute: 0.1 10*3/uL (ref 0.0–0.1)
Basophils Relative: 0.9 % (ref 0.0–3.0)
Eosinophils Absolute: 0.4 10*3/uL (ref 0.0–0.7)
Eosinophils Relative: 6 % — ABNORMAL HIGH (ref 0.0–5.0)
HCT: 38.4 % (ref 36.0–46.0)
Hemoglobin: 13.2 g/dL (ref 12.0–15.0)
Lymphocytes Relative: 27.3 % (ref 12.0–46.0)
Lymphs Abs: 1.6 10*3/uL (ref 0.7–4.0)
MCHC: 34.4 g/dL (ref 30.0–36.0)
MCV: 80.4 fl (ref 78.0–100.0)
Monocytes Absolute: 0.3 10*3/uL (ref 0.1–1.0)
Monocytes Relative: 4.8 % (ref 3.0–12.0)
Neutro Abs: 3.6 10*3/uL (ref 1.4–7.7)
Neutrophils Relative %: 61 % (ref 43.0–77.0)
Platelets: 351 10*3/uL (ref 150.0–400.0)
RBC: 4.78 Mil/uL (ref 3.87–5.11)
RDW: 13.9 % (ref 11.5–15.5)
WBC: 5.8 10*3/uL (ref 4.0–10.5)

## 2019-12-12 LAB — VITAMIN D 25 HYDROXY (VIT D DEFICIENCY, FRACTURES): VITD: 23.05 ng/mL — ABNORMAL LOW (ref 30.00–100.00)

## 2019-12-12 MED ORDER — IPRATROPIUM BROMIDE 0.03 % NA SOLN: 2.0000 | Freq: Two times a day (BID) | NASAL | 5 refills | Status: AC

## 2019-12-12 MED ORDER — BUDESONIDE 0.5 MG/2ML IN SUSP: 0.5000 mg | Freq: Every day | RESPIRATORY_TRACT | 5 refills | Status: AC

## 2019-12-12 MED ORDER — MONTELUKAST SODIUM 10 MG PO TABS: 10.0000 mg | ORAL_TABLET | Freq: Every day | ORAL | 1 refills | Status: DC

## 2019-12-12 MED ORDER — IPRATROPIUM-ALBUTEROL 0.5-2.5 (3) MG/3ML IN SOLN: 3.0000 mL | Freq: Four times a day (QID) | RESPIRATORY_TRACT | 5 refills | Status: AC | PRN

## 2019-12-12 NOTE — Progress Notes (Signed)
Nancy Barnes DOB: 01/08/1971 Encounter date: 12/12/2019  This is a 49 y.o. female who presents with Chief Complaint  Patient presents with  . Hypertension    History of present illness: Last visit with me was 09/02/2019.  Since last visit, she has had a breast reduction. States that breasts have come back. Also numbness/tingling have come back in last few weeks. She is starting to "spill" out of compression. Left side a little numbness left side. She has measured and back to 38DD. States she hasn't changed what/how much she is eating. Same level of activity at her job. Pant still fit and are looser, but chest feels like it is still growing.   Hypertension: Irbesartan 300 mg daily, hydrochlorothiazide 12.5 mg daily. Forgot to take medication last night.   Sleep apnea: Uses CPAP regularly.  Asthma: Breo (?  Cost of med - tried to go through [employer], had one letter stating that she was covered with them and then when she checked back in with them they told her there was nothing they can do). She has looked at all brands and they are all around 200-300 (last time she looked at [facility] was $22 and insurance wants her to buy 3 mo supply for this).   Seasonal allergies:taking zyrtec; uses atrovent nasal spray.  Dysthymia: On Cytomel Anxiety: doing ok. Increased work stress (mental and physical).   Allergies  Allergen Reactions  . Nsaids Anaphylaxis and Shortness Of Breath  . Shellfish Allergy Anaphylaxis  . Abilify [Aripiprazole] Other (See Comments)    hallucinations  . Amlodipine     swelling  . Amoxicillin Hives  . Fish Allergy Other (See Comments)    Reaction unknown  . Latuda [Lurasidone Hcl] Other (See Comments)    Uncontrollable shaking, hopelessness.  Barbera Setters [Levofloxacin In D5w] Other (See Comments)    Reaction unknown  . Other Other (See Comments)    Reaction unknown to nuts  . Penicillins Other (See Comments)    Reaction unknown Has patient had a PCN reaction  causing immediate rash, facial/tongue/throat swelling, SOB or lightheadedness with hypotension: Yes Has patient had a PCN reaction causing severe rash involving mucus membranes or skin necrosis: No Has patient had a PCN reaction that required hospitalization: No Has patient had a PCN reaction occurring within the last 10 years: No If all of the above answers are "NO", then may proceed with Cephalosporin use.  . Prednisone Other (See Comments)    Pruritus/ hives  . Seroquel [Quetiapine Fumarate] Other (See Comments)    Patient states it made her not care about the world and she just sat down and didn't get up.  . Ativan [Lorazepam] Anxiety    Shakiness  . Sulfa Antibiotics Rash  . Xanax [Alprazolam] Anxiety    Makes pt. anxious   Current Meds  Medication Sig  . Ascorbic Acid (VITAMIN C) 100 MG tablet Take 100 mg by mouth daily.  Marland Kitchen b complex vitamins capsule Take 1 capsule by mouth daily.  . cetirizine (ZYRTEC) 10 MG chewable tablet Chew 10 mg by mouth daily.  . Cholecalciferol (VITAMIN D3) 10 MCG (400 UNIT) tablet Take 400 Units by mouth daily.  Marland Kitchen EPINEPHrine 0.3 mg/0.3 mL IJ SOAJ injection Inject 0.3 mLs (0.3 mg total) into the muscle as needed for anaphylaxis.  . ferrous sulfate 325 (65 FE) MG tablet Take 325 mg by mouth daily with breakfast.  . hydrochlorothiazide (HYDRODIURIL) 12.5 MG tablet Take 1 tablet by mouth once daily  . hydrOXYzine (ATARAX/VISTARIL)  10 MG tablet Take 1 tablet (10 mg total) by mouth 3 (three) times daily as needed for anxiety.  Marland Kitchen ipratropium (ATROVENT) 0.03 % nasal spray Place 2 sprays into the nose 2 (two) times daily. 2 sprays each nostril twice daily  . ipratropium-albuterol (DUONEB) 0.5-2.5 (3) MG/3ML SOLN Take 3 mLs by nebulization every 6 (six) hours as needed (shortness of breath/wheezing.).  Marland Kitchen irbesartan (AVAPRO) 300 MG tablet Take 1 tablet (300 mg total) by mouth daily.  Marland Kitchen omeprazole (PRILOSEC) 10 MG capsule Take 10 mg by mouth daily.  Marland Kitchen venlafaxine XR  (EFFEXOR-XR) 75 MG 24 hr capsule Take 3 capsules (225 mg total) by mouth daily with breakfast.  . VENTOLIN HFA 108 (90 Base) MCG/ACT inhaler INHALE 2 PUFFS BY MOUTH EVERY 6 HOURS AS NEEDED FOR WHEEZING OR SHORTNESS OF BREATH. PATIENT NEEDS APPOINTMENT  . [DISCONTINUED] fluticasone furoate-vilanterol (BREO ELLIPTA) 100-25 MCG/INH AEPB Inhale 1 puff into the lungs every morning.  . [DISCONTINUED] ipratropium (ATROVENT) 0.03 % nasal spray Place 0.03 sprays into the nose 2 (two) times daily. 2 sprays each nostril twice daily  . [DISCONTINUED] ipratropium-albuterol (DUONEB) 0.5-2.5 (3) MG/3ML SOLN Take 3 mLs by nebulization every 6 (six) hours as needed (shortness of breath/wheezing.).    Review of Systems  Constitutional: Negative for chills, fatigue and fever.  Respiratory: Negative for cough, chest tightness, shortness of breath and wheezing.   Cardiovascular: Negative for chest pain, palpitations and leg swelling.    Objective:  BP (!) 140/100 (BP Location: Left Arm, Patient Position: Sitting, Cuff Size: Normal)   Pulse 83   Temp 98.2 F (36.8 C) (Oral)   Ht 5' 4.5" (1.638 m)   Wt 179 lb 6.4 oz (81.4 kg)   SpO2 98%   BMI 30.32 kg/m   Weight: 179 lb 6.4 oz (81.4 kg)   BP Readings from Last 3 Encounters:  12/12/19 (!) 140/100  10/28/19 (!) 155/110  10/07/19 (!) 139/94   Wt Readings from Last 3 Encounters:  12/12/19 179 lb 6.4 oz (81.4 kg)  09/23/19 176 lb 1.6 oz (79.9 kg)  09/14/19 173 lb 15.1 oz (78.9 kg)    Physical Exam Constitutional:      General: She is not in acute distress.    Appearance: She is well-developed.  Cardiovascular:     Rate and Rhythm: Normal rate and regular rhythm.     Heart sounds: Normal heart sounds. No murmur heard.  No friction rub.  Pulmonary:     Effort: Pulmonary effort is normal. No respiratory distress.     Breath sounds: Decreased breath sounds and wheezing present. No rales.     Comments: Inspiratory wheezes bilat Musculoskeletal:      Right lower leg: No edema.     Left lower leg: No edema.  Neurological:     Mental Status: She is alert and oriented to person, place, and time.  Psychiatric:        Behavior: Behavior normal.     Assessment/Plan  1. OSA (obstructive sleep apnea) She continues to wear CPAP.  We have discussed benefits of this and she is good about compliance.  2. Primary hypertension Blood pressure is higher than I would like in the office.  She does state that she gets better numbers at home.  She is going to monitor at home for me and report back to me.  She does feel her blood pressures are better controlled with irbesartan.  She will be more mindful of sodium intake as well.  3. GAD (  generalized anxiety disorder) Mood has been stable on Effexor 225 mg daily.  She does follow with Dr. Michae Kava.  She does use the hydroxyzine 10 mg 3 times daily as needed anxiety.  4. Asthma, intrinsic Cost of medications is an issue for her.  I feel that we have exhausted all patient assistance programs.  I am going to see if she can get nebulized Pulmicort more affordably.  She will let me know about this.  We will continue to work to control allergies which should help with asthma control. - budesonide (PULMICORT) 0.5 MG/2ML nebulizer solution; Take 2 mLs (0.5 mg total) by nebulization daily.  Dispense: 60 mL; Refill: 5 - ipratropium-albuterol (DUONEB) 0.5-2.5 (3) MG/3ML SOLN; Take 3 mLs by nebulization every 6 (six) hours as needed (shortness of breath/wheezing.).  Dispense: 360 mL; Refill: 5 - ipratropium (ATROVENT) 0.03 % nasal spray; Place 2 sprays into the nose 2 (two) times daily. 2 sprays each nostril twice daily  Dispense: 30 mL; Refill: 5 - montelukast (SINGULAIR) 10 MG tablet; Take 1 tablet (10 mg total) by mouth at bedtime.  Dispense: 90 tablet; Refill: 1  5. Seasonal and perennial allergic rhinitis Continue with Singulair, Zyrtec.  6. Hypokalemia - Comprehensive metabolic panel; Future  7. Elevated  alkaline phosphatase level Minimal elevation previously.  We will recheck for stability.  Other liver enzymes have been normal.  8. Iron deficiency Working on a higher iron diet. - CBC with Differential/Platelet; Future - Iron, TIBC and Ferritin Panel; Future  9. Vitamin D deficiency She does take vitamin D supplementation 400 units daily. - VITAMIN D 25 Hydroxy (Vit-D Deficiency, Fractures); Future   Return for pending bp report.    Theodis Shove, MD

## 2019-12-12 NOTE — Patient Instructions (Signed)
Let me know how blood pressures are looking at home.

## 2019-12-13 LAB — IRON,TIBC AND FERRITIN PANEL
%SAT: 9 % (calc) — ABNORMAL LOW (ref 16–45)
Ferritin: 12 ng/mL — ABNORMAL LOW (ref 16–232)
Iron: 37 ug/dL — ABNORMAL LOW (ref 40–190)
TIBC: 411 mcg/dL (calc) (ref 250–450)

## 2019-12-15 ENCOUNTER — Other Ambulatory Visit: Payer: Self-pay

## 2019-12-15 ENCOUNTER — Telehealth (HOSPITAL_COMMUNITY): Payer: 59 | Admitting: Psychiatry

## 2019-12-15 DIAGNOSIS — F411 Generalized anxiety disorder: Secondary | ICD-10-CM

## 2019-12-15 DIAGNOSIS — F99 Mental disorder, not otherwise specified: Secondary | ICD-10-CM

## 2019-12-15 DIAGNOSIS — F332 Major depressive disorder, recurrent severe without psychotic features: Secondary | ICD-10-CM

## 2019-12-15 NOTE — Progress Notes (Unsigned)
Disconnected  Nancy Barnes is feeling depressed but is still pushing through. She was diagnosed with iron deficiency and low vitamin d levels. She is on medications and is working with her primary care doctor. She's had several stressors lately involving her family and is trying to help her son as much as possible. Sheenah denies SI/HI. The phone disconnected several times throughout our session today.   10 min

## 2019-12-16 MED ORDER — VENLAFAXINE HCL ER 75 MG PO CP24: 225.0000 mg | ORAL_CAPSULE | Freq: Every day | ORAL | 0 refills | Status: DC

## 2019-12-16 MED ORDER — HYDROXYZINE HCL 10 MG PO TABS: 10.0000 mg | ORAL_TABLET | Freq: Three times a day (TID) | ORAL | 0 refills | Status: DC | PRN

## 2019-12-18 ENCOUNTER — Encounter: Payer: Self-pay | Admitting: Family Medicine

## 2019-12-18 NOTE — Progress Notes (Signed)
Message sent to patient in mychart to call Dr. Ulice Bold office.

## 2020-01-10 ENCOUNTER — Other Ambulatory Visit: Payer: Self-pay | Admitting: Family Medicine

## 2020-01-10 DIAGNOSIS — J4540 Moderate persistent asthma, uncomplicated: Secondary | ICD-10-CM

## 2020-01-13 ENCOUNTER — Other Ambulatory Visit: Payer: Self-pay | Admitting: Family Medicine

## 2020-01-27 ENCOUNTER — Ambulatory Visit: Payer: 59 | Admitting: Plastic Surgery

## 2020-01-31 ENCOUNTER — Ambulatory Visit (INDEPENDENT_AMBULATORY_CARE_PROVIDER_SITE_OTHER): Payer: 59 | Admitting: Plastic Surgery

## 2020-01-31 ENCOUNTER — Other Ambulatory Visit: Payer: Self-pay

## 2020-01-31 ENCOUNTER — Encounter: Payer: Self-pay | Admitting: Plastic Surgery

## 2020-01-31 VITALS — BP 123/82 | HR 98 | Ht 64.0 in | Wt 172.0 lb

## 2020-01-31 DIAGNOSIS — N6489 Other specified disorders of breast: Secondary | ICD-10-CM | POA: Diagnosis not present

## 2020-01-31 DIAGNOSIS — N62 Hypertrophy of breast: Secondary | ICD-10-CM | POA: Diagnosis not present

## 2020-01-31 NOTE — Progress Notes (Addendum)
   Subjective:    Patient ID: Nancy Barnes, female    DOB: 1970/05/07, 50 y.o.   MRN: [id number]  This is a 50 year old female here for evaluation of her breasts.  She underwent bilateral breast reductions September 2021.  She complains of some asymmetry and still excess size to her breasts.  Her sternal to nipple distance on the right is 24 cm and 26 cm on the left.  Prior to surgery she was 28 cm on the right and 30 cm on the left.  She was a 36G cup and now says she is D/DD.  She would like to be a C/B cup.  She is 5 feet 4 inches tall and weighs 172 pounds with a 10 pound weight loss.  She does not smoke and does not have diabetes.  There is some asymmetry noted which is similar to the asymmetry she had prior to surgery but is noticeable that the left side is slightly larger and more full on the lateral aspect.  No lumps or bumps are palpated and her incisions have healed very nicely.    Review of Systems  Constitutional: Negative for activity change and appetite change.  Eyes: Negative.   Respiratory: Negative.   Cardiovascular: Negative.  Negative for leg swelling.  Gastrointestinal: Negative.  Negative for abdominal distention and abdominal pain.  Endocrine: Negative.   Genitourinary: Negative.   Musculoskeletal: Positive for back pain.  Hematological: Negative.   Psychiatric/Behavioral: Negative.        Objective:   Physical Exam Vitals and nursing note reviewed.  Constitutional:      Appearance: Normal appearance.  HENT:     Head: Normocephalic and atraumatic.  Cardiovascular:     Rate and Rhythm: Normal rate.     Pulses: Normal pulses.  Pulmonary:     Effort: Pulmonary effort is normal.  Abdominal:     General: Abdomen is flat. There is no distension.  Neurological:     General: No focal deficit present.     Mental Status: She is alert and oriented to person, place, and time.  Psychiatric:        Mood and Affect: Mood normal.        Behavior: Behavior normal.         Thought Content: Thought content normal.         Assessment & Plan:     ICD-10-CM   1. Symptomatic mammary hypertrophy  N62   2. Postoperative breast asymmetry  N64.89     The patient would like to have a revision with excision of excess breast tissue for symmetry.  Pictures were obtained of the patient and placed in the chart with the patient's or guardian's permission.

## 2020-02-09 ENCOUNTER — Other Ambulatory Visit: Payer: Self-pay

## 2020-02-09 ENCOUNTER — Telehealth (INDEPENDENT_AMBULATORY_CARE_PROVIDER_SITE_OTHER): Payer: 59 | Admitting: Psychiatry

## 2020-02-09 DIAGNOSIS — F411 Generalized anxiety disorder: Secondary | ICD-10-CM

## 2020-02-09 DIAGNOSIS — F5105 Insomnia due to other mental disorder: Secondary | ICD-10-CM

## 2020-02-09 DIAGNOSIS — F332 Major depressive disorder, recurrent severe without psychotic features: Secondary | ICD-10-CM

## 2020-02-09 DIAGNOSIS — F99 Mental disorder, not otherwise specified: Secondary | ICD-10-CM | POA: Diagnosis not present

## 2020-02-09 MED ORDER — HYDROXYZINE HCL 10 MG PO TABS: 10.0000 mg | ORAL_TABLET | Freq: Three times a day (TID) | ORAL | 0 refills | Status: DC | PRN

## 2020-02-09 MED ORDER — VENLAFAXINE HCL ER 75 MG PO CP24: 225.0000 mg | ORAL_CAPSULE | Freq: Every day | ORAL | 0 refills | Status: DC

## 2020-02-09 NOTE — Progress Notes (Signed)
Virtual Visit via Telephone Note  I connected with Nancy Barnes on 02/09/20 at  9:30 AM EST by telephone and verified that I am speaking with the correct person using two identifiers.  Location: Patient: home Provider: office   I discussed the limitations, risks, security and privacy concerns of performing an evaluation and management service by telephone and the availability of in person appointments. I also discussed with the patient that there may be a patient responsible charge related to this service. The patient expressed understanding and agreed to proceed.   History of Present Illness: Nancy Barnes states she is doing well. Her job is busy but she likes it. It is physically grueling and she is not getting any younger. She is managing her ADHD at work well. At home she is not well focused or goal oriented.  is Nancy Barnes has noticed an increase in stress induced panic attacks. It began after reminders of her son's father in law recent suicide. During the is time she was taking Vistaril TID but over the last one week she has not needed it. She is feeling depressed and is noticed a significant increase in anhedonia and low motivation. Her energy is low. Nancy Barnes is no longer journal ing which is sometime she used to do religiously. She is no longer reading at bedtime which is something she used to love to do. Sleep is good.  She denies SI/HI.    Observations/Objective:  General Appearance: unable to assess  Eye Contact:  unable to assess  Speech:  Clear and Coherent and Normal Rate  Volume:  Normal  Mood:  Anxious and Depressed  Affect:  Full Range  Thought Process:  Goal Directed, Linear and Descriptions of Associations: Intact  Orientation:  Full (Time, Place, and Person)  Thought Content:  Logical  Suicidal Thoughts:  No  Homicidal Thoughts:  No  Memory:  Immediate;   Good  Judgement:  Good  Insight:  Good  Psychomotor Activity: unable to assess  Concentration:  Concentration: Good   Recall:  Good  Fund of Knowledge:  Good  Language:  Good  Akathisia:  unable to assess  Handed:  Right  AIMS (if indicated):     Assets:  Communication Skills Desire for Improvement Financial Resources/Insurance Housing Resilience Social Support Talents/Skills Transportation Vocational/Educational  ADL's:  unable to assess  Cognition:  WNL  Sleep:          I reviewed the information below on 02/09/20 and have updated it Assessment and Plan:  ADHD- combined type; MDD- recurrent, severe without psychotic features; GAD; Insomnia   Effexor XR 225mg  po qD   Vistaril 10mg  po TID prn anxiety  Referred to therapy due to significant increase in mood and anxiety symptoms.   Follow Up Instructions: In 4-8 weeks or sooner if needed   I discussed the assessment and treatment plan with the patient. The patient was provided an opportunity to ask questions and all were answered. The patient agreed with the plan and demonstrated an understanding of the instructions.   The patient was advised to call back or seek an in-person evaluation if the symptoms worsen or if the condition fails to improve as anticipated.  I provided 25 minutes of non-face-to-face time during this encounter.   , MD

## 2020-02-10 ENCOUNTER — Other Ambulatory Visit: Payer: Self-pay | Admitting: Family Medicine

## 2020-02-10 DIAGNOSIS — J4540 Moderate persistent asthma, uncomplicated: Secondary | ICD-10-CM

## 2020-03-02 ENCOUNTER — Other Ambulatory Visit: Payer: Self-pay

## 2020-03-02 ENCOUNTER — Ambulatory Visit (INDEPENDENT_AMBULATORY_CARE_PROVIDER_SITE_OTHER): Payer: 59 | Admitting: Family Medicine

## 2020-03-02 VITALS — BP 138/98 | HR 79 | Temp 98.2°F | Resp 18 | Ht 64.0 in | Wt 179.8 lb

## 2020-03-02 DIAGNOSIS — S39012A Strain of muscle, fascia and tendon of lower back, initial encounter: Secondary | ICD-10-CM | POA: Diagnosis not present

## 2020-03-02 NOTE — Progress Notes (Signed)
Established Patient Office Visit  Subjective:  Patient ID: Nancy Barnes, female    DOB: 07/29/1970  Age: 50 y.o. MRN: [id number]  CC:  Chief Complaint  Patient presents with  . Back Pain    Lower back . Onset:1 week     HPI Nancy Barnes presents for low back pain left lower lumbar area.  Onset about a week ago.  She states that she took a step and felt a sharp pain left lower lumbar area.  This was not a misstep or stepping into a hole.  There is no jolt.  She has had some pain localized mostly in the area since then.  Occasional radiation down the left lower extremity.  No numbness or weakness.  No urine or stool incontinence.  Pain is worse with sitting, standing, walking.  She gets some relief when she lays on her stomach.  She has taken some Tylenol without much relief.  She cannot take nonsteroidals secondary to anaphylaxis.  She works at [employer] and does a lot of lifting.  No history of back surgery no chronic back difficulties.  Past Medical History:  Diagnosis Date  . Anxiety   . Asthma   . Chicken pox   . Complication of anesthesia    unable to tolerate NSAIDS  . Depression   . Environmental allergies    food/medication  . Family history of adverse reaction to anesthesia    mother skin glue allergy post surgery   . GERD (gastroesophageal reflux disease)   . Headache(784.0)   . Hypertension   . Hypokalemia   . Pneumomediastinum (HCC)   . Seasonal allergies   . Sleep apnea   . Suicidal ideations   . UTI (urinary tract infection)     Past Surgical History:  Procedure Laterality Date  . ABLATION    . BREAST BIOPSY  04/2011  . BREAST REDUCTION SURGERY Bilateral 09/14/2019   Procedure: BILATERAL BREAST REDUCTION WITH LIPOSUCTION;  Surgeon: Peggye Form, DO;  Location: [facility];  Service: Plastics;  Laterality: Bilateral;  3 hours  . TUBAL LIGATION      Family History  Problem Relation Age of Onset  . Heart attack  Mother 23       Bypass x5  . Allergies Mother   . CAD Mother   . High blood pressure Mother   . High Cholesterol Mother   . Heart disease Mother   . Arthritis Mother   . Diabetes Mellitus II Father   . Hypertension Father   . Heart disease Father   . CAD Father   . Other Father        Agent orange exposure  . Arthritis Father   . Depression Father   . Allergies Daughter   . Psoriasis Daughter   . Arthritis Daughter        psoriatic  . Hypothyroidism Daughter   . Allergies Maternal Grandmother   . Atrial fibrillation Maternal Grandmother   . Stroke Maternal Grandmother   . Asthma Maternal Grandfather   . Asthma Paternal Grandmother   . Asthma Paternal Grandfather   . Suicidality Neg Hx   . Anxiety disorder Neg Hx     Social History   Socioeconomic History  . Marital status: Divorced    Spouse name: Not on file  . Number of children: Not on file  . Years of education: Not on file  . Highest education level: Not on file  Occupational History  .  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 Occupation: Engineer, water  Tobacco Use  . Smoking status: Never Smoker  . Smokeless tobacco: Never Used  Vaping Use  . Vaping Use: Never used  Substance and Sexual Activity  . Alcohol use: Not Currently    Comment: socially  . Drug use: No  . Sexual activity: Yes    Partners: Male    Birth control/protection: None, Condom  Other Topics Concern  . Not on file  Social History Narrative   Works for [employer]. Lives at home with 2 teenage children. Never smoker.   Social Determinants of Health   Financial Resource Strain: Not on file  Food Insecurity: Not on file  Transportation Needs: Not on file  Physical Activity: Not on file  Stress: Not on file  Social Connections: Not on file  Intimate Partner Violence: Not on file    Outpatient Medications Prior to Visit  Medication Sig  Dispense Refill  . Ascorbic Acid (VITAMIN C) 100 MG tablet Take 100 mg by mouth daily.    Marland Kitchen b complex vitamins capsule Take 1 capsule by mouth daily.    . budesonide (PULMICORT) 0.5 MG/2ML nebulizer solution Take 2 mLs (0.5 mg total) by nebulization daily. 60 mL 5  . cetirizine (ZYRTEC) 10 MG chewable tablet Chew 10 mg by mouth daily.    . Cholecalciferol (VITAMIN D3) 10 MCG (400 UNIT) tablet Take 400 Units by mouth daily.    . ferrous sulfate 325 (65 FE) MG tablet Take 325 mg by mouth daily with breakfast.    . hydrochlorothiazide (HYDRODIURIL) 12.5 MG tablet Take 1 tablet by mouth once daily 30 tablet 5  . hydrOXYzine (ATARAX/VISTARIL) 10 MG tablet Take 1 tablet (10 mg total) by mouth 3 (three) times daily as needed for anxiety. 270 tablet 0  . ipratropium (ATROVENT) 0.03 % nasal spray Place 2 sprays into the nose 2 (two) times daily. 2 sprays each nostril twice daily 30 mL 5  . ipratropium-albuterol (DUONEB) 0.5-2.5 (3) MG/3ML SOLN Take 3 mLs by nebulization every 6 (six) hours as needed (shortness of breath/wheezing.). 360 mL 5  . irbesartan (AVAPRO) 300 MG tablet Take 1 tablet by mouth once daily 90 tablet 0  . omeprazole (PRILOSEC) 10 MG capsule Take 10 mg by mouth daily.    Marland Kitchen venlafaxine XR (EFFEXOR-XR) 75 MG 24 hr capsule Take 3 capsules (225 mg total) by mouth daily with breakfast. 270 capsule 0  . VENTOLIN HFA 108 (90 Base) MCG/ACT inhaler INHALE 2 PUFFS BY MOUTH EVERY 6 HOURS AS NEEDED FOR WHEEZING FOR SHORTNESS OF BREATH . APPOINTMENT REQUIRED FOR FUTURE REFILLS 18 g 0  . EPINEPHrine 0.3 mg/0.3 mL IJ SOAJ injection Inject 0.3 mLs (0.3 mg total) into the muscle as needed for anaphylaxis. (Patient not taking: No sig reported) 1 each 5  . montelukast (SINGULAIR) 10 MG tablet Take 1 tablet (10 mg total) by mouth at bedtime. (Patient not taking: No sig reported) 90 tablet 1   No facility-administered medications prior to visit.    Allergies  Allergen Reactions  . Nsaids Anaphylaxis and  Shortness Of Breath  . Shellfish Allergy Anaphylaxis  . Abilify [Aripiprazole] Other (See Comments)    hallucinations  . Amlodipine     swelling  . Amoxicillin Hives  . Fish Allergy Other (See Comments)    Reaction unknown  . Kasandra Knudsen [Lurasidone Hcl] Other (See Comments)  Uncontrollable shaking, hopelessness.  Barbera Setters [Levofloxacin In D5w] Other (See Comments)    Reaction unknown  . Other Other (See Comments)    Reaction unknown to nuts  . Penicillins Other (See Comments)    Reaction unknown Has patient had a PCN reaction causing immediate rash, facial/tongue/throat swelling, SOB or lightheadedness with hypotension: Yes Has patient had a PCN reaction causing severe rash involving mucus membranes or skin necrosis: No Has patient had a PCN reaction that required hospitalization: No Has patient had a PCN reaction occurring within the last 10 years: No If all of the above answers are "NO", then may proceed with Cephalosporin use.  . Prednisone Other (See Comments)    Pruritus/ hives  . Seroquel [Quetiapine Fumarate] Other (See Comments)    Patient states it made her not care about the world and she just sat down and didn't get up.  . Ativan [Lorazepam] Anxiety    Shakiness  . Sulfa Antibiotics Rash  . Xanax [Alprazolam] Anxiety    Makes pt. anxious    ROS Review of Systems  Constitutional: Negative for appetite change, chills, fever and unexpected weight change.  Gastrointestinal: Negative for abdominal pain.  Genitourinary: Negative for dysuria.  Musculoskeletal: Positive for back pain.  Neurological: Negative for weakness and numbness.      Objective:    Physical Exam Vitals reviewed.  Constitutional:      Appearance: Normal appearance.  Cardiovascular:     Rate and Rhythm: Normal rate and regular rhythm.  Pulmonary:     Effort: Pulmonary effort is normal.     Breath sounds: Normal breath sounds.  Musculoskeletal:     Comments: Straight leg raise are negative  bilaterally.  She does have some low back pain with leg raise on the left.  She has some tenderness left lower lumbar region.  No spinal tenderness.  Neurological:     Mental Status: She is alert.     Comments: Full strength lower extremities with symmetric reflexes knee and ankle bilaterally     BP (!) 138/98   Pulse 79   Temp 98.2 F (36.8 C) (Oral)   Resp 18   Ht 5\' 4"  (1.626 m)   Wt 179 lb 12.8 oz (81.6 kg)   SpO2 98%   BMI 30.86 kg/m  Wt Readings from Last 3 Encounters:  03/02/20 179 lb 12.8 oz (81.6 kg)  01/31/20 172 lb (78 kg)  12/12/19 179 lb 6.4 oz (81.4 kg)     Health Maintenance Due  Topic Date Due  . PAP SMEAR-Modifier  Never done  . COLONOSCOPY (Pts 45-67yrs Insurance coverage will need to be confirmed)  Never done  . COVID-19 Vaccine (3 - Booster for Moderna series) 08/29/2019    There are no preventive care reminders to display for this patient.  Lab Results  Component Value Date   TSH 1.55 05/30/2019   Lab Results  Component Value Date   WBC 5.8 12/12/2019   HGB 13.2 12/12/2019   HCT 38.4 12/12/2019   MCV 80.4 12/12/2019   PLT 351.0 12/12/2019   Lab Results  Component Value Date   NA 140 12/12/2019   K 3.8 12/12/2019   CO2 31 12/12/2019   GLUCOSE 99 12/12/2019   BUN 9 12/12/2019   CREATININE 0.72 12/12/2019   BILITOT 0.4 12/12/2019   ALKPHOS 119 (H) 12/12/2019   AST 13 12/12/2019   ALT 16 12/12/2019   PROT 7.3 12/12/2019   ALBUMIN 4.4 12/12/2019   CALCIUM 9.1 12/12/2019  ANIONGAP 11 09/13/2019   GFR 98.95 12/12/2019   Lab Results  Component Value Date   CHOL 216 (H) 05/30/2019   Lab Results  Component Value Date   HDL 51.90 05/30/2019   Lab Results  Component Value Date   LDLCALC 136 (H) 05/30/2019   Lab Results  Component Value Date   TRIG 143.0 05/30/2019   Lab Results  Component Value Date   CHOLHDL 4 05/30/2019   No results found for: HGBA1C    Assessment & Plan:   Left lumbar back pain with onset about a  week ago.  Nonfocal neuro exam.  May have some nerve root impingement given the fact she has some radiation down left lower extremity but no focal neuro deficits  -Recommend conservative measurements with heat and/or ice -recommend extension stretches with handout given -She cannot take nonsteroidals as above -She has multiple drug allergies and was reluctant to take muscle lectures as well. -We did discuss other options such as physical therapy or chiropractic care and she will consider. -Reviewed appropriate lifting. -Walk and aerobic exercise as tolerated  No orders of the defined types were placed in this encounter.   Follow-up: No follow-ups on file.    Evelena PeatBruce Avonell Lenig, MD

## 2020-03-02 NOTE — Patient Instructions (Signed)

## 2020-03-16 ENCOUNTER — Emergency Department (HOSPITAL_COMMUNITY)
Admission: EM | Admit: 2020-03-16 | Discharge: 2020-03-16 | Disposition: A | Discharge status: Home / Self Care | Payer: 59 | Attending: Emergency Medicine | Admitting: Emergency Medicine

## 2020-03-16 ENCOUNTER — Encounter (HOSPITAL_COMMUNITY): Payer: Self-pay | Admitting: *Deleted

## 2020-03-16 ENCOUNTER — Other Ambulatory Visit: Payer: Self-pay

## 2020-03-16 ENCOUNTER — Other Ambulatory Visit: Payer: Self-pay | Admitting: *Deleted

## 2020-03-16 DIAGNOSIS — M542 Cervicalgia: Secondary | ICD-10-CM

## 2020-03-16 DIAGNOSIS — M62838 Other muscle spasm: Secondary | ICD-10-CM | POA: Diagnosis not present

## 2020-03-16 DIAGNOSIS — Z79899 Other long term (current) drug therapy: Secondary | ICD-10-CM | POA: Diagnosis not present

## 2020-03-16 DIAGNOSIS — J4540 Moderate persistent asthma, uncomplicated: Secondary | ICD-10-CM

## 2020-03-16 DIAGNOSIS — J45909 Unspecified asthma, uncomplicated: Secondary | ICD-10-CM | POA: Diagnosis not present

## 2020-03-16 DIAGNOSIS — I10 Essential (primary) hypertension: Secondary | ICD-10-CM | POA: Diagnosis not present

## 2020-03-16 MED ORDER — HYDROMORPHONE HCL 1 MG/ML IJ SOLN
1.0000 mg | Freq: Once | INTRAMUSCULAR | Status: AC
  Administered 2020-03-16: 1 mg via INTRAMUSCULAR
  Filled 2020-03-16: qty 1

## 2020-03-16 MED ORDER — LIDOCAINE 5 % EX PTCH: 1.0000 | MEDICATED_PATCH | CUTANEOUS | 0 refills | Status: DC

## 2020-03-16 MED ORDER — METHOCARBAMOL 500 MG PO TABS
500.0000 mg | ORAL_TABLET | Freq: Once | ORAL | Status: AC
  Administered 2020-03-16: 500 mg via ORAL
  Filled 2020-03-16: qty 1

## 2020-03-16 MED ORDER — DIPHENHYDRAMINE HCL 12.5 MG/5ML PO ELIX
25.0000 mg | ORAL_SOLUTION | Freq: Once | ORAL | Status: AC
  Administered 2020-03-16: 25 mg via ORAL
  Filled 2020-03-16: qty 10

## 2020-03-16 MED ORDER — METHOCARBAMOL 500 MG PO TABS: 500.0000 mg | ORAL_TABLET | Freq: Two times a day (BID) | ORAL | 0 refills | Status: DC

## 2020-03-16 MED ORDER — HYDROCODONE-ACETAMINOPHEN 5-325 MG PO TABS: 1.0000 | ORAL_TABLET | ORAL | 0 refills | Status: DC | PRN

## 2020-03-16 MED ORDER — HYDROMORPHONE HCL 1 MG/ML IJ SOLN: 1.0000 mg | Freq: Once | INTRAMUSCULAR | Status: DC

## 2020-03-16 MED ORDER — ALBUTEROL SULFATE HFA 108 (90 BASE) MCG/ACT IN AERS: INHALATION_SPRAY | RESPIRATORY_TRACT | 0 refills | Status: DC

## 2020-03-16 NOTE — Telephone Encounter (Signed)
Rx done. 

## 2020-03-16 NOTE — Discharge Instructions (Signed)
Your exam is consistent with likely muscle spasm.  If you develop fever, worsening neck pain despite medication, weakness, facial droop, difficulty speaking, numbness or tingling in hands please seek reevaluation

## 2020-03-16 NOTE — ED Provider Notes (Signed)
[facility] EMERGENCY DEPARTMENT Provider Note   CSN: [id number] Arrival date & time: 03/16/20  1506    History Chief Complaint  Patient presents with  . Neck Pain    Nancy Barnes is a 50 y.o. female with history significant for anxiety, sleep apnea presents for evaluation of neck pain.  Neck pain x3 days.  Located posterior neck.  Has been trying ibuprofen at home without relief.  Pain worse when she moves neck.  Did recently chiropractic adjustment 4 days ago.  No headache, lightheadedness, dizziness, facial droop, paresthesias, weakness.  No chest pain, shortness of breath, fever, chills, nausea, vomiting, abdominal pain.  Unfortunately has significant medication allergies with anaphylactic reactions.  Denies additional aggravating or alleviating factors.  Rates pain a 7/10. Pain improves when not moving neck. No recent fall or trauma.    History obtained from patient and past medical records.  No interpreter used  HPI     Past Medical History:  Diagnosis Date  . Anxiety   . Asthma   . Chicken pox   . Complication of anesthesia    unable to tolerate NSAIDS  . Depression   . Environmental allergies    food/medication  . Family history of adverse reaction to anesthesia    mother skin glue allergy post surgery   . GERD (gastroesophageal reflux disease)   . Headache(784.0)   . Hypertension   . Hypokalemia   . Pneumomediastinum (HCC)   . Seasonal allergies   . Sleep apnea   . Suicidal ideations   . UTI (urinary tract infection)     Patient Active Problem List   Diagnosis Date Noted  . Postoperative breast asymmetry 01/31/2020  . Hypertension 09/02/2019  . OSA (obstructive sleep apnea) 06/17/2019  . Back pain 04/29/2018  . Neck pain 04/29/2018  . Symptomatic mammary hypertrophy 04/29/2018  . GAD (generalized anxiety disorder) 11/03/2013  . Major psychotic depression, recurrent (HCC) 11/03/2013  . Seasonal and perennial allergic rhinitis 02/29/2012  . Food  allergy 02/29/2012  . Medication intolerance 02/29/2012  . Asthma, intrinsic 12/23/2011  . Depression 12/09/2011  . Anxiety 12/09/2011    Past Surgical History:  Procedure Laterality Date  . ABLATION    . BREAST BIOPSY  04/2011  . BREAST REDUCTION SURGERY Bilateral 09/14/2019   Procedure: BILATERAL BREAST REDUCTION WITH LIPOSUCTION;  Surgeon: Peggye Form, DO;  Location: [facility];  Service: Plastics;  Laterality: Bilateral;  3 hours  . TUBAL LIGATION       OB History   No obstetric history on file.     Family History  Problem Relation Age of Onset  . Heart attack Mother 84       Bypass x5  . Allergies Mother   . CAD Mother   . High blood pressure Mother   . High Cholesterol Mother   . Heart disease Mother   . Arthritis Mother   . Diabetes Mellitus II Father   . Hypertension Father   . Heart disease Father   . CAD Father   . Other Father        Agent orange exposure  . Arthritis Father   . Depression Father   . Allergies Daughter   . Psoriasis Daughter   . Arthritis Daughter        psoriatic  . Hypothyroidism Daughter   . Allergies Maternal Grandmother   . Atrial fibrillation Maternal Grandmother   . Stroke Maternal Grandmother   . Asthma Maternal Grandfather   .  Asthma Paternal Grandmother   . Asthma Paternal Grandfather   . Suicidality Neg Hx   . Anxiety disorder Neg Hx     Social History   Tobacco Use  . Smoking status: Never Smoker  . Smokeless tobacco: Never Used  Vaping Use  . Vaping Use: Never used  Substance Use Topics  . Alcohol use: Not Currently    Comment: socially  . Drug use: No    Home Medications Prior to Admission medications   Medication Sig Start Date End Date Taking? Authorizing Provider  acetaminophen (TYLENOL) 500 MG tablet Take 1,000 mg by mouth every 6 (six) hours as needed.   Yes [provider]  albuterol (VENTOLIN HFA) 108 (90 Base) MCG/ACT inhaler INHALE 2 PUFFS BY MOUTH EVERY 6 HOURS AS  NEEDED FOR WHEEZING FOR SHORTNESS OF BREATH . APPOINTMENT REQUIRED FOR FUTURE REFILLS 03/16/20  Yes Koberlein, Junell C, MD  b complex vitamins capsule Take 1 capsule by mouth daily.   Yes [provider]  budesonide (PULMICORT) 0.5 MG/2ML nebulizer solution Take 2 mLs (0.5 mg total) by nebulization daily. 12/12/19  Yes Koberlein, Paris Lore, MD  cetirizine (ZYRTEC) 10 MG chewable tablet Chew 10 mg by mouth daily.   Yes [provider]  Cholecalciferol (VITAMIN D3) 10 MCG (400 UNIT) tablet Take 400 Units by mouth daily.   Yes [provider]  EPINEPHrine 0.3 mg/0.3 mL IJ SOAJ injection Inject 0.3 mLs (0.3 mg total) into the muscle as needed for anaphylaxis. 09/02/19  Yes Koberlein, Paris Lore, MD  ferrous sulfate 325 (65 FE) MG tablet Take 325 mg by mouth daily with breakfast.   Yes [provider]  hydrochlorothiazide (HYDRODIURIL) 12.5 MG tablet Take 1 tablet by mouth once daily 01/16/20  Yes Koberlein, Junell C, MD  HYDROcodone-acetaminophen (NORCO/VICODIN) 5-325 MG tablet Take 1 tablet by mouth every 4 (four) hours as needed. 03/16/20  Yes Moe Graca A, PA-C  ipratropium (ATROVENT) 0.03 % nasal spray Place 2 sprays into the nose 2 (two) times daily. 2 sprays each nostril twice daily 12/12/19  Yes Koberlein, Junell C, MD  ipratropium-albuterol (DUONEB) 0.5-2.5 (3) MG/3ML SOLN Take 3 mLs by nebulization every 6 (six) hours as needed (shortness of breath/wheezing.). 12/12/19  Yes Koberlein, Paris Lore, MD  irbesartan (AVAPRO) 300 MG tablet Take 1 tablet by mouth once daily 01/10/20  Yes Koberlein, Junell C, MD  lidocaine (LIDODERM) 5 % Place 1 patch onto the skin daily. Remove & Discard patch within 12 hours or as directed by MD 03/16/20  Yes Jamile Sivils A, PA-C  methocarbamol (ROBAXIN) 500 MG tablet Take 1 tablet (500 mg total) by mouth 2 (two) times daily. 03/16/20  Yes Shyam Dawson A, PA-C  omeprazole (PRILOSEC) 10 MG capsule Take 10 mg by mouth daily.   Yes  [provider]  venlafaxine XR (EFFEXOR-XR) 75 MG 24 hr capsule Take 3 capsules (225 mg total) by mouth daily with breakfast. 02/09/20  Yes [name], MD  Ascorbic Acid (VITAMIN C) 100 MG tablet Take 100 mg by mouth daily. Patient not taking: No sig reported    [provider]  hydrOXYzine (ATARAX/VISTARIL) 10 MG tablet Take 1 tablet (10 mg total) by mouth 3 (three) times daily as needed for anxiety. Patient not taking: No sig reported 02/09/20   [name], MD  montelukast (SINGULAIR) 10 MG tablet Take 1 tablet (10 mg total) by mouth at bedtime. Patient not taking: No sig reported 12/12/19   Wynn Banker, MD  Allergies    Nsaids, Shellfish allergy, Abilify [aripiprazole], Amlodipine, Amoxicillin, Fish allergy, Latuda [lurasidone hcl], Levaquin [levofloxacin in d5w], Other, Penicillins, Prednisone, Seroquel [quetiapine fumarate], Ativan [lorazepam], Sulfa antibiotics, and Xanax [alprazolam]  Review of Systems   Review of Systems  Constitutional: Negative.   HENT: Negative.   Respiratory: Negative.   Cardiovascular: Negative.   Gastrointestinal: Negative.   Genitourinary: Negative.   Musculoskeletal: Positive for neck pain.  Skin: Negative.   Neurological: Negative.   All other systems reviewed and are negative.   Physical Exam Updated Vital Signs BP (!) 159/108   Pulse 77   Temp 98.4 F (36.9 C) (Oral)   Resp 18   Ht 5' 4.5" (1.638 m)   Wt 81.2 kg   SpO2 94%   BMI 30.25 kg/m   Physical Exam Vitals and nursing note reviewed.  Constitutional:      General: She is not in acute distress.    Appearance: She is well-developed and well-nourished. She is not ill-appearing, toxic-appearing or diaphoretic.  HENT:     Head: Normocephalic and atraumatic.     Nose: Nose normal.     Mouth/Throat:     Mouth: Mucous membranes are moist.  Eyes:     Pupils: Pupils are equal, round, and reactive to light.  Neck:     Trachea: Trachea and phonation  normal.      Comments: Diffuse muscle tenderness to posterior neck down bilateral trapezius with palpable spasm.  No midline tenderness.  Pain worse with into left shoulder. Cardiovascular:     Rate and Rhythm: Normal rate.     Pulses: Normal pulses and intact distal pulses.          Radial pulses are 2+ on the right side and 2+ on the left side.       Dorsalis pedis pulses are 2+ on the right side and 2+ on the left side.     Heart sounds: Normal heart sounds.  Pulmonary:     Effort: Pulmonary effort is normal. No respiratory distress.     Breath sounds: Normal breath sounds and air entry.     Comments: Speaks in full sentences without difficulty Chest:     Comments: Equal rise and fall to chest wall Abdominal:     General: Bowel sounds are normal. There is no distension.     Palpations: Abdomen is soft.     Tenderness: There is no abdominal tenderness.     Hernia: No hernia is present.     Comments: Soft, nontender  Musculoskeletal:        General: Normal range of motion.     Cervical back: Normal range of motion. Torticollis present. No edema, erythema, signs of trauma, rigidity, bony tenderness or crepitus. Pain with movement and muscular tenderness present. No spinous process tenderness.     Thoracic back: Normal.     Lumbar back: Normal.     Comments: Moves all 4 extremities without difficulty  Lymphadenopathy:     Cervical: No cervical adenopathy.  Skin:    General: Skin is warm and dry.     Capillary Refill: Capillary refill takes less than 2 seconds.     Comments: No edema, erythema or warmth  Neurological:     General: No focal deficit present.     Mental Status: She is alert.     Cranial Nerves: Cranial nerves are intact.     Sensory: Sensation is intact.     Motor: Motor function is intact.  Coordination: Coordination is intact.     Gait: Gait is intact.     Comments: Cranial nerves II through XII intact Equal handgrip bilaterally Equal shoulder shrug however  palpable spasms to bilateral trapezius  Psychiatric:        Mood and Affect: Mood and affect normal.    ED Results / Procedures / Treatments   Labs (all labs ordered are listed, but only abnormal results are displayed) Labs Reviewed - No data to display  EKG None  Radiology No results found.  Procedures Procedures   Medications Ordered in ED Medications  diphenhydrAMINE (BENADRYL) 12.5 MG/5ML elixir 25 mg (25 mg Oral Given 03/16/20 1600)  methocarbamol (ROBAXIN) tablet 500 mg (500 mg Oral Given 03/16/20 1600)  HYDROmorphone (DILAUDID) injection 1 mg (1 mg Intramuscular Given 03/16/20 1600)    ED Course  I have reviewed the triage vital signs and the nursing notes.  Pertinent labs & imaging results that were available during my care of the patient were reviewed by me and considered in my medical decision making (see chart for details).  50 year old presents for evaluation of neck pain.  Began 4 days ago.  She is afebrile, nonseptic, non-ill-appearing.  Pain worse with movement.  Pain significantly improved when she keeps her head straight.  Did have recent chiropractic adjustment however she has a nonfocal neuro exam without deficits. Diffuse, palpable spasms to bilateral trapezius.  No midline cervical tenderness.  No overlying skin changes.  She is afebrile, no meningismus.  Low suspicion for meningitis.  She did have recent chiropractic adjustment symptoms are more consistent with musculoskeletal in etiology, spasms.  I have low suspicion for acute dissection, CVA.  No radicular complaints have low suspicion for acute disc herniation, fracture.  Will attempt pain management and reassess.  Unfortunately significant allergies with anaphylaxis.  Patient reassessed.  Feels significant improvement with pain medication here in ED. Full ROM WO difficulty. Will DC home with pain medication, recommend heat to area.  I discussed strict return precautions with patient.  She is agreeable.  The  patient has been appropriately medically screened and/or stabilized in the ED. I have low suspicion for any other emergent medical condition which would require further screening, evaluation or treatment in the ED or require inpatient management.  Patient is hemodynamically stable and in no acute distress.  Patient able to ambulate in department prior to ED.  Evaluation does not show acute pathology that would require ongoing or additional emergent interventions while in the emergency department or further inpatient treatment.  I have discussed the diagnosis with the patient and answered all questions.  Pain is been managed while in the emergency department and patient has no further complaints prior to discharge.  Patient is comfortable with plan discussed in room and is stable for discharge at this time.  I have discussed strict return precautions for returning to the emergency department.  Patient was encouraged to follow-up with PCP/specialist refer to at discharge.  Patient discussed with attending, Dr. Estell Harpin who agrees with above treatment, plan and disposition.    MDM Rules/Calculators/A&P                           Final Clinical Impression(s) / ED Diagnoses Final diagnoses:  Neck pain  Muscle spasms of neck    Rx / DC Orders ED Discharge Orders         Ordered    HYDROcodone-acetaminophen (NORCO/VICODIN) 5-325 MG tablet  Every 4 hours  PRN        03/16/20 1735    methocarbamol (ROBAXIN) 500 MG tablet  2 times daily        03/16/20 1735    lidocaine (LIDODERM) 5 %  Every 24 hours        03/16/20 1735           Glenford Garis A, PA-C 03/16/20 1737    Bethann Berkshire, MD 03/16/20 2205

## 2020-03-16 NOTE — ED Triage Notes (Signed)
Neck pain for 3 days, states nothing helps, no known injury. Unable to turn neck with difficulty

## 2020-03-22 NOTE — Progress Notes (Signed)
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 Medications (nonsteroidal anti-inflammatory drugs, Toradol) caused facial caused facial swelling/angioedema. Foods-pecan, walnut caused itchiness and sores in mouth.             Shrimp, crab caused chest tightness 3 years ago. Seasonal allergic rhinitis- pollens and cats. Latex-rash Allergy Skin Test- 05/19/12- POS especially grass, tree, dust mite, cockroach. 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 Used only 27% of nights between Feb 8-March 9. Body weight today- Covid vax-3 Moderna Flu vax-had Recent ED visit for neck pain Rx'd as musculoskeletal> pain meds. Says she falls asleep before putting mask on. Emphasized medical and compliance goals. Asthma- using rescue inhaler at least once daily. Not wheezing at night. Ok Advair.   ROS-see HPI   + = positive Constitutional:   No-   weight loss, night sweats, fevers, chills, fatigue, lassitude. HEENT:   No-  headaches,  difficulty swallowing, tooth/dental problems, sore throat,      +sneezing, itching, ear ache, +nasal congestion, post nasal drip,  CV:  No-   chest pain, orthopnea, PND, swelling in lower extremities, anasarca, dizziness, +palpitations Resp: + shortness of breath with exertion or at rest.             + productive cough,  No non-productive cough,  No- coughing up of blood.              No-   change in color of mucus.  + wheezing.   Skin: No-   rash or lesions. GI:  + heartburn, indigestion, abdominal pain, nausea, vomiting,  GU: . MS:  No-   joint pain or swelling.   Neuro-     nothing unusual Psych:  No- change in mood or affect. + depression or +anxiety.  No memory loss.  OBJ- Physical Exam General- Alert, Oriented, Affect-appropriate, Distress- none acute Skin- rash-none, lesions- none, excoriation- none Lymphadenopathy- none Head- atraumatic            Eyes- Gross vision intact, PERRLA, conjunctivae and secretions clear            Ears- Hearing, canals-normal            Nose- clear, no-Septal dev, mucus, polyps, erosion, perforation             Throat- Mallampati II , mucosa clear , drainage- none, tonsils- atrophic Neck- flexible , trachea midline, no stridor , thyroid nl, carotid no bruit Chest - symmetrical excursion , unlabored           Heart/CV- RRR , no murmur , no gallop  , no rub, nl s1 s2                           -  JVD- none , edema- none, stasis changes- none, varices- none           Lung- clear to P&A, wheeze- none, cough-none , dullness-none, rub- none           Chest wall-  Abd-  Br/ Gen/ Rectal- Not done, not indicated Extrem- cyanosis- none, clubbing, none, atrophy- none, strength- nl Neuro- grossly intact to observation

## 2020-03-23 ENCOUNTER — Ambulatory Visit (INDEPENDENT_AMBULATORY_CARE_PROVIDER_SITE_OTHER): Payer: 59 | Admitting: Internal Medicine

## 2020-03-23 ENCOUNTER — Encounter: Payer: Self-pay | Admitting: Internal Medicine

## 2020-03-23 ENCOUNTER — Other Ambulatory Visit: Payer: Self-pay

## 2020-03-23 VITALS — BP 140/90 | HR 77 | Temp 97.4°F | Ht 64.5 in | Wt 179.4 lb

## 2020-03-23 DIAGNOSIS — J4540 Moderate persistent asthma, uncomplicated: Secondary | ICD-10-CM | POA: Diagnosis not present

## 2020-03-23 DIAGNOSIS — G4733 Obstructive sleep apnea (adult) (pediatric): Secondary | ICD-10-CM

## 2020-03-23 MED ORDER — ADVAIR HFA 115-21 MCG/ACT IN AERO: 2.0000 | INHALATION_SPRAY | Freq: Two times a day (BID) | RESPIRATORY_TRACT | 12 refills | Status: DC

## 2020-03-23 NOTE — Patient Instructions (Signed)
Script sent for Advair hfa   115/21-   Inhale 1 puff then rinse mouth, twice daily We expect this to let you reduce how often you need to use your albuterol rescue inhaler  Please try putting your CPAP on as you get into bed, so it can work for you.  Order- DME [facility]- please send replacement headgear. Continue auto 5-15, mask of choice, humidifier, supplies, AirView/ card  Please call if we can help

## 2020-03-29 ENCOUNTER — Telehealth (HOSPITAL_COMMUNITY): Payer: 59 | Admitting: Psychiatry

## 2020-04-05 ENCOUNTER — Telehealth (INDEPENDENT_AMBULATORY_CARE_PROVIDER_SITE_OTHER): Payer: 59 | Admitting: Psychiatry

## 2020-04-05 ENCOUNTER — Other Ambulatory Visit: Payer: Self-pay

## 2020-04-05 DIAGNOSIS — F332 Major depressive disorder, recurrent severe without psychotic features: Secondary | ICD-10-CM | POA: Diagnosis not present

## 2020-04-05 DIAGNOSIS — F411 Generalized anxiety disorder: Secondary | ICD-10-CM | POA: Diagnosis not present

## 2020-04-05 MED ORDER — VENLAFAXINE HCL ER 75 MG PO CP24: 225.0000 mg | ORAL_CAPSULE | Freq: Every day | ORAL | 0 refills | Status: DC

## 2020-04-05 NOTE — Progress Notes (Signed)
Virtual Visit via Video Note  I connected with Nancy Barnes on 04/05/20 at  9:00 AM EDT by a video enabled telemedicine application and verified that I am speaking with the correct person using two identifiers.  Location: Patient: work Provider: office   I discussed the limitations of evaluation and management by telemedicine and the availability of in person appointments. The patient expressed understanding and agreed to proceed.  History of Present Illness: Nancy Barnes shares that she is doing well. She continues to work. It is very physical work. Her eldest son has moved back home. It is an adjustment but they are all working out. Her boyfriend is having a lot of mental health problems and she is trying to support him as best as she can. Her own anxiety and depression are stable. Her sleep is good. She denies SI/HI.    Observations/Objective: Psychiatric Specialty Exam: ROS  There were no vitals taken for this visit.There is no height or weight on file to calculate BMI.  General Appearance: Fairly Groomed  Eye Contact:  Good  Speech:  Clear and Coherent and Normal Rate  Volume:  Normal  Mood:  Anxious and Depressed  Affect:  Full Range  Thought Process:  Goal Directed, Linear and Descriptions of Associations: Intact  Orientation:  Full (Time, Place, and Person)  Thought Content:  Logical  Suicidal Thoughts:  No  Homicidal Thoughts:  No  Memory:  Immediate;   Good  Judgement:  Good  Insight:  Good  Psychomotor Activity:  Normal  Concentration:  Concentration: Good  Recall:  Good  Fund of Knowledge:  Good  Language:  Good  Akathisia:  No  Handed:  Right  AIMS (if indicated):     Assets:  Communication Skills Desire for Improvement Financial Resources/Insurance Housing Resilience Social Support Talents/Skills Transportation Vocational/Educational  ADL's:  Intact  Cognition:  WNL  Sleep:        Assessment and Plan: 1. GAD (generalized anxiety disorder) -  venlafaxine XR (EFFEXOR-XR) 75 MG 24 hr capsule; Take 3 capsules (225 mg total) by mouth daily with breakfast.  Dispense: 270 capsule; Refill: 0  2. Severe episode of recurrent major depressive disorder, without psychotic features (HCC) - venlafaxine XR (EFFEXOR-XR) 75 MG 24 hr capsule; Take 3 capsules (225 mg total) by mouth daily with breakfast.  Dispense: 270 capsule; Refill: 0  3. Insomnia- managing on her own      Depression screen [facility] 2/9 04/05/2020 12/12/2019 09/02/2019 05/30/2019 03/19/2018  Decreased Interest 0 0 0 1 1  Down, Depressed, Hopeless 1 0 0 1 0  PHQ - 2 Score 1 0 0 2 1  Altered sleeping - - 0 1 -  Tired, decreased energy - - 0 3 -  Change in appetite - - 0 1 -  Feeling bad or failure about yourself  - - 0 1 -  Trouble concentrating - - 0 0 -  Moving slowly or fidgety/restless - - 0 0 -  Suicidal thoughts - - 0 0 -  PHQ-9 Score - - 0 8 -  Difficult doing work/chores - - Not difficult at all - -    Flowsheet Row Video Visit [facility]2020 in [facility] from 03/16/2020 in [place] EMERGENCY DEPARTMENT  C-SSRS RISK CATEGORY No Risk Error: Question 1 not populated       Follow Up Instructions: In 2-3 months or sooner if needed   I discussed the assessment and treatment plan with the patient. The patient  was provided an opportunity to ask questions and all were answered. The patient agreed with the plan and demonstrated an understanding of the instructions.   The patient was advised to call back or seek an in-person evaluation if the symptoms worsen or if the condition fails to improve as anticipated.   [name], MD

## 2020-04-27 ENCOUNTER — Ambulatory Visit: Payer: 59 | Admitting: Plastic Surgery

## 2020-05-12 ENCOUNTER — Telehealth: Payer: Self-pay | Admitting: Physician Assistant

## 2020-05-12 DIAGNOSIS — R197 Diarrhea, unspecified: Secondary | ICD-10-CM

## 2020-05-12 DIAGNOSIS — R112 Nausea with vomiting, unspecified: Secondary | ICD-10-CM

## 2020-05-12 NOTE — Progress Notes (Signed)
Based on what you shared with me, I feel your condition warrants further evaluation and I recommend that you be seen in a face to face office visit. Giving continued vomiting and weakness despite use of an anti-vomiting medication (zofran), you need to be seen in person for assessment including exam and possible check of electrolytes and renal function, as well as to receive an injection of medicine to stop the vomiting. If vomiting continues you will become very dehydrated which can cause heart issues, kidney issues, etc. Please be seen at local urgent care this evening. If anything acutely worsens, please be evaluated at nearest ER.    NOTE: If you entered your credit card information for this eVisit, you will not be charged. You may see a "hold" on your card for the $35 but that hold will drop off and you will not have a charge processed.   If you are having a true medical emergency please call 911.      For an urgent face to face visit, [facility] has six urgent care centers for your convenience:     [facility] at [facility] Directions [phone] [address] [place], [place] [address] . 8 am - 4 pm Monday - Friday    [facility]) Get Driving Directions [phone] [address] [place], [place] [address] . 8 am to 8 pm Monday-Friday . 10 am to 6 pm [facility] Urgent [facility]) Get Driving Directions [phone]  [address] [place],  [place]  [address] . 8 am to 8 pm Monday-Friday . 8 am to 4 pm [facility] at [facility] Get Driving Directions [phone] [address] [place], [place] [address] . 8 am to 8 pm Monday-Friday . 8 am to 4 pm [facility] at [facility] Get Driving Directions  [phone] [address] [place], [place] [address] . 8 am to 8 pm Monday-Friday . 8 am to 4 pm [facility] Urgent Care at [facility] Directions [phone] [address], [address] [place], [place] [address] . 8 am to 8 pm Monday-Friday . 8 am to 4 pm Saturday-Sunday     Your MyChart E-visit questionnaire answers were reviewed by a board certified advanced clinical practitioner to complete your personal care plan based on your specific symptoms.  Thank you for using e-Visits.

## 2020-05-15 ENCOUNTER — Other Ambulatory Visit: Payer: Self-pay

## 2020-05-15 ENCOUNTER — Encounter: Payer: Self-pay | Admitting: Plastic Surgery

## 2020-05-15 ENCOUNTER — Ambulatory Visit (INDEPENDENT_AMBULATORY_CARE_PROVIDER_SITE_OTHER): Payer: 59 | Admitting: Plastic Surgery

## 2020-05-15 VITALS — Wt 174.4 lb

## 2020-05-15 DIAGNOSIS — N62 Hypertrophy of breast: Secondary | ICD-10-CM | POA: Diagnosis not present

## 2020-05-15 DIAGNOSIS — N6489 Other specified disorders of breast: Secondary | ICD-10-CM

## 2020-05-15 NOTE — Progress Notes (Signed)
   Subjective:    Patient ID: Nancy Barnes, female    DOB: 01-Nov-1970, 50 y.o.   MRN: [id number]  The patient is a 50 year old female who underwent bilateral breast reduction.  She complains that she still has some neck and back pain.  She is like to be a little smaller than she is now may be even 2 cup sizes.  Her surgery was in September 2021 and she had between 650 and 750 g removed from both breasts.  She has a little bit of asymmetry with some excess fat on the lateral aspect of the left breast.  The patient states that it is difficult to fit into 1 bra because of this excess tissue.  She is going to continue to work on weight loss to see if that helps at all.  But she is concerned that she still has nerve neck and back pain.  At the time of her surgery she was 183 pounds and she is now 174 5 pounds.   Review of Systems  Constitutional: Negative.   HENT: Negative.   Eyes: Negative.   Respiratory: Negative.   Cardiovascular: Negative.   Gastrointestinal: Negative.   Genitourinary: Negative.        Objective:   Physical Exam Vitals and nursing note reviewed.  Constitutional:      Appearance: Normal appearance.  HENT:     Head: Normocephalic and atraumatic.  Cardiovascular:     Rate and Rhythm: Normal rate.     Pulses: Normal pulses.  Pulmonary:     Effort: Pulmonary effort is normal.  Musculoskeletal:        General: No swelling or deformity.  Skin:    General: Skin is warm.     Capillary Refill: Capillary refill takes less than 2 seconds.  Neurological:     General: No focal deficit present.     Mental Status: She is alert and oriented to person, place, and time.  Psychiatric:        Mood and Affect: Mood normal.        Behavior: Behavior normal.        Assessment & Plan:     ICD-10-CM   1. Postoperative breast asymmetry  N64.89   2. Symptomatic mammary hypertrophy  N62     I think the patient would be a good candidate for smaller breast.  She is going to  continue to work on some weight loss and as she is switching jobs she will get back to [place] in the next 3 to 6 months for follow-up. Pictures were obtained of the patient and placed in the chart with the patient's or guardian's permission.

## 2020-05-16 ENCOUNTER — Telehealth: Payer: Self-pay

## 2020-05-16 NOTE — Telephone Encounter (Signed)
Faxed referral to [facility]

## 2020-05-22 ENCOUNTER — Encounter: Payer: Self-pay | Admitting: Family Medicine

## 2020-05-24 ENCOUNTER — Other Ambulatory Visit: Payer: Self-pay | Admitting: Family Medicine

## 2020-05-24 DIAGNOSIS — J4540 Moderate persistent asthma, uncomplicated: Secondary | ICD-10-CM

## 2020-05-24 MED ORDER — ALBUTEROL SULFATE HFA 108 (90 BASE) MCG/ACT IN AERS: 2.0000 | INHALATION_SPRAY | RESPIRATORY_TRACT | 5 refills | Status: DC | PRN

## 2020-05-24 NOTE — Telephone Encounter (Signed)
Noted  

## 2020-06-28 ENCOUNTER — Other Ambulatory Visit: Payer: Self-pay

## 2020-06-28 ENCOUNTER — Telehealth (INDEPENDENT_AMBULATORY_CARE_PROVIDER_SITE_OTHER): Payer: Self-pay | Admitting: Psychiatry

## 2020-06-28 DIAGNOSIS — F5105 Insomnia due to other mental disorder: Secondary | ICD-10-CM

## 2020-06-28 DIAGNOSIS — F332 Major depressive disorder, recurrent severe without psychotic features: Secondary | ICD-10-CM

## 2020-06-28 DIAGNOSIS — F411 Generalized anxiety disorder: Secondary | ICD-10-CM

## 2020-06-28 DIAGNOSIS — F99 Mental disorder, not otherwise specified: Secondary | ICD-10-CM

## 2020-06-28 MED ORDER — VENLAFAXINE HCL ER 75 MG PO CP24: 225.0000 mg | ORAL_CAPSULE | Freq: Every day | ORAL | 0 refills | Status: DC

## 2020-06-28 NOTE — Progress Notes (Signed)
Virtual Visit via Video Note  I connected with Nancy Barnes on 06/28/20 at  9:00 AM EDT by a video enabled telemedicine application and verified that I am speaking with the correct person using two identifiers.  Location: Patient: home Provider: office   I discussed the limitations of evaluation and management by telemedicine and the availability of in person appointments. The patient expressed understanding and agreed to proceed.  History of Present Illness: Nancy Barnes states she has been doing well and has no concerns. She has been very busy. She denies depression and anhedonia. Sleep is good. She denies SI/HI. Anxiety is mild and manageable and mostly related to stress. [name] is now working from home with [employer]. She likes it. She is active in her [employer]. Her son has moved back at home after he got a divorce.    Observations/Objective: Psychiatric Specialty Exam: ROS  There were no vitals taken for this visit.There is no height or weight on file to calculate BMI.  General Appearance: Casual  Eye Contact:  Good  Speech:  Clear and Coherent and Normal Rate  Volume:  Normal  Mood:  Euthymic  Affect:  Full Range  Thought Process:  Goal Directed, Linear, and Descriptions of Associations: Intact  Orientation:  Full (Time, Place, and Person)  Thought Content:  Logical  Suicidal Thoughts:  No  Homicidal Thoughts:  No  Memory:  Immediate;   Good  Judgement:  Good  Insight:  Good  Psychomotor Activity:  Normal  Concentration:  Concentration: Good  Recall:  Good  Fund of Knowledge:  Good  Language:  Good  Akathisia:  No  Handed:  Right  AIMS (if indicated):     Assets:  Communication Skills Desire for Improvement Financial Resources/Insurance Housing Leisure Time Resilience Social Support Talents/Skills Transportation Vocational/Educational  ADL's:  Intact  Cognition:  WNL  Sleep:        Assessment and Plan: 1. Severe episode of recurrent major depressive  disorder, without psychotic features (HCC) - venlafaxine XR (EFFEXOR-XR) 75 MG 24 hr capsule; Take 3 capsules (225 mg total) by mouth daily with breakfast.  Dispense: 270 capsule; Refill: 0  2. GAD (generalized anxiety disorder) - venlafaxine XR (EFFEXOR-XR) 75 MG 24 hr capsule; Take 3 capsules (225 mg total) by mouth daily with breakfast.  Dispense: 270 capsule; Refill: 0  3. Insomnia due to other mental disorder  - no refill on Vistaril as pt states she does not need it  Depression screen [facility] 2/9 06/28/2020 04/05/2020 12/12/2019 09/02/2019 05/30/2019  Decreased Interest 0 0 0 0 1  Down, Depressed, Hopeless 0 1 0 0 1  PHQ - 2 Score 0 1 0 0 2  Altered sleeping - - - 0 1  Tired, decreased energy - - - 0 3  Change in appetite - - - 0 1  Feeling bad or failure about yourself  - - - 0 1  Trouble concentrating - - - 0 0  Moving slowly or fidgety/restless - - - 0 0  Suicidal thoughts - - - 0 0  PHQ-9 Score - - - 0 8  Difficult doing work/chores - - - Not difficult at all -    Flowsheet Row Video Visit from 06/28/2020 in [facility] Video Visit from 04/05/2020 in [facility] from 03/16/2020 in [place] EMERGENCY DEPARTMENT  C-SSRS RISK CATEGORY No Risk No Risk Error: Question 1 not populated        Follow Up  Instructions: In 3 months or sooner if needed   I discussed the assessment and treatment plan with the patient. The patient was provided an opportunity to ask questions and all were answered. The patient agreed with the plan and demonstrated an understanding of the instructions.   The patient was advised to call back or seek an in-person evaluation if the symptoms worsen or if the condition fails to improve as anticipated.  I provided 6 minutes of non-face-to-face time during this encounter.   [name], MD

## 2020-08-04 NOTE — Assessment & Plan Note (Signed)
Script sent for Advair hfa 115/21.  Education done

## 2020-08-04 NOTE — Assessment & Plan Note (Signed)
Benefits frm CPAP but needs better compliance.  Plan- education done, replace headgear

## 2020-08-20 ENCOUNTER — Other Ambulatory Visit: Payer: Self-pay | Admitting: Family Medicine

## 2020-08-21 MED ORDER — ADVAIR HFA 115-21 MCG/ACT IN AERO: 2.0000 | INHALATION_SPRAY | Freq: Two times a day (BID) | RESPIRATORY_TRACT | 12 refills | Status: DC

## 2020-08-24 MED ORDER — FLUTICASONE-SALMETEROL 250-50 MCG/ACT IN AEPB: INHALATION_SPRAY | RESPIRATORY_TRACT | 12 refills | Status: DC

## 2020-08-24 NOTE — Addendum Note (Signed)
Addended by: Jetty Duhamel D on: 08/24/2020 09:54 PM   Modules accepted: Orders

## 2020-08-24 NOTE — Telephone Encounter (Signed)
Wixela script sent to [facility]

## 2020-08-24 NOTE — Telephone Encounter (Signed)
Dr. Maple Hudson, please see new mychart message sent by pt and advise: Nancy [facility] (supporting Jetty Duhamel D, MD) 24 minutes ago (2:02 PM)   I apologize greatly for reaching back out to you all again.  I am wondering if Dr Maple Hudson would be willing to write my rx for Wixela 250/50.  I can afford that one at $116?  The one that was written for me was $427.68 with [facility].  I just can't afford that.   Allergies  Allergen Reactions   Nsaids Anaphylaxis and Shortness Of Breath   Shellfish Allergy Anaphylaxis   Abilify [Aripiprazole] Other (See Comments)    hallucinations   Amlodipine     swelling   Amoxicillin Hives   Fish Allergy Other (See Comments)    Reaction unknown   Latuda [Lurasidone Hcl] Other (See Comments)    Uncontrollable shaking, hopelessness.   Levaquin [Levofloxacin In D5w] Other (See Comments)    Reaction unknown   Other Other (See Comments)    Reaction unknown to nuts   Penicillins Other (See Comments)    Reaction unknown Has patient had a PCN reaction causing immediate rash, facial/tongue/throat swelling, SOB or lightheadedness with hypotension: Yes Has patient had a PCN reaction causing severe rash involving mucus membranes or skin necrosis: No Has patient had a PCN reaction that required hospitalization: No Has patient had a PCN reaction occurring within the last 10 years: No If all of the above answers are "NO", then may proceed with Cephalosporin use.   Prednisone Other (See Comments)    Pruritus/ hives   Seroquel [Quetiapine Fumarate] Other (See Comments)    Patient states it made her not care about the world and she just sat down and didn't get up.   Ativan [Lorazepam] Anxiety    Shakiness   Sulfa Antibiotics Rash   Xanax [Alprazolam] Anxiety    Makes pt. anxious     Current Outpatient Medications:    albuterol (VENTOLIN HFA) 108 (90 Base) MCG/ACT inhaler, Inhale 2 puffs into the lungs every 4 (four) hours as needed for  wheezing or shortness of breath., Disp: 18 g, Rfl: 5   Ascorbic Acid (VITAMIN C) 100 MG tablet, Take 100 mg by mouth daily., Disp: , Rfl:    b complex vitamins capsule, Take 1 capsule by mouth daily., Disp: , Rfl:    budesonide (PULMICORT) 0.5 MG/2ML nebulizer solution, Take 2 mLs (0.5 mg total) by nebulization daily., Disp: 60 mL, Rfl: 5   cetirizine (ZYRTEC) 10 MG chewable tablet, Chew 10 mg by mouth daily., Disp: , Rfl:    Cholecalciferol (VITAMIN D3) 10 MCG (400 UNIT) tablet, Take 400 Units by mouth daily., Disp: , Rfl:    EPINEPHrine 0.3 mg/0.3 mL IJ SOAJ injection, Inject 0.3 mLs (0.3 mg total) into the muscle as needed for anaphylaxis. (Patient not taking: Reported on 06/28/2020), Disp: 1 each, Rfl: 5   ferrous sulfate 325 (65 FE) MG tablet, Take 325 mg by mouth daily with breakfast., Disp: , Rfl:    fluticasone-salmeterol (ADVAIR HFA) 115-21 MCG/ACT inhaler, Inhale 2 puffs into the lungs 2 (two) times daily. Rinse mouth, Disp: 1 each, Rfl: 12   hydrochlorothiazide (HYDRODIURIL) 12.5 MG tablet, Take 1 tablet by mouth once daily, Disp: 30 tablet, Rfl: 5   hydrOXYzine (ATARAX/VISTARIL) 10 MG tablet, Take 1 tablet (10 mg total) by mouth 3 (three) times daily as needed for anxiety., Disp: 270 tablet, Rfl: 0   ipratropium (ATROVENT) 0.03 % nasal  spray, Place 2 sprays into the nose 2 (two) times daily. 2 sprays each nostril twice daily, Disp: 30 mL, Rfl: 5   ipratropium-albuterol (DUONEB) 0.5-2.5 (3) MG/3ML SOLN, Take 3 mLs by nebulization every 6 (six) hours as needed (shortness of breath/wheezing.)., Disp: 360 mL, Rfl: 5   irbesartan (AVAPRO) 300 MG tablet, Take 1 tablet by mouth once daily, Disp: 90 tablet, Rfl: 0   omeprazole (PRILOSEC) 10 MG capsule, Take 10 mg by mouth daily., Disp: , Rfl:    venlafaxine XR (EFFEXOR-XR) 75 MG 24 hr capsule, Take 3 capsules (225 mg total) by mouth daily with breakfast., Disp: 270 capsule, Rfl: 0

## 2020-09-03 ENCOUNTER — Encounter: Payer: Self-pay | Admitting: Family Medicine

## 2020-10-11 ENCOUNTER — Telehealth (HOSPITAL_BASED_OUTPATIENT_CLINIC_OR_DEPARTMENT_OTHER): Payer: Self-pay | Admitting: Psychiatry

## 2020-10-11 ENCOUNTER — Other Ambulatory Visit: Payer: Self-pay

## 2020-10-11 DIAGNOSIS — F5105 Insomnia due to other mental disorder: Secondary | ICD-10-CM

## 2020-10-11 DIAGNOSIS — F332 Major depressive disorder, recurrent severe without psychotic features: Secondary | ICD-10-CM

## 2020-10-11 DIAGNOSIS — F411 Generalized anxiety disorder: Secondary | ICD-10-CM

## 2020-10-11 DIAGNOSIS — F99 Mental disorder, not otherwise specified: Secondary | ICD-10-CM

## 2020-10-11 MED ORDER — VENLAFAXINE HCL ER 75 MG PO CP24: 225.0000 mg | ORAL_CAPSULE | Freq: Every day | ORAL | 0 refills | Status: DC

## 2020-10-11 MED ORDER — HYDROXYZINE HCL 10 MG PO TABS: 10.0000 mg | ORAL_TABLET | Freq: Every day | ORAL | 0 refills | Status: DC | PRN

## 2020-10-11 NOTE — Progress Notes (Signed)
Virtual Visit via Video Note  I connected with Nancy Barnes on 10/11/20 at 10:45 AM EDT by a video enabled telemedicine application and verified that I am speaking with the correct person using two identifiers.  Location: Patient: home Provider: office   I discussed the limitations of evaluation and management by telemedicine and the availability of in person appointments. The patient expressed understanding and agreed to proceed.  History of Present Illness: "I have been ok. I have been alright. Overwhelmed but I am ok". Nancy Barnes is overwhelmed by a lot things she can't control. She worries but it is manageable. 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 Her company is frustrating and she is not very happy with the world in general right now. She goes to [place] and sometimes sees family and friends. Nancy Barnes is doing a lot crafts. She has gained weight now that she is working from home. She has made diet changes and has joined [employer]. Her depression level is about 2/10 (10 being the worst). The depression comes and goes. Her sleep is good. Energy is low and something she continuously struggles with. She denies SI/HI. She denies any recent panic attacks. She takes Vistaril a few times a week or less. Her focus and productivity are good at work. She is surpassing her set goals.    Observations/Objective: Psychiatric Specialty Exam: ROS  There were no vitals taken for this visit.There is no height or weight on file to calculate BMI.  General Appearance: Casual and Neat  Eye Contact:  Good  Speech:  Clear and Coherent and Normal Rate  Volume:  Normal  Mood:  Anxious  Affect:  Full Range  Thought Process:  Goal Directed, Linear, and Descriptions of Associations: Intact  Orientation:  Full (Time, Place, and Person)  Thought Content:  Logical  Suicidal Thoughts:  No  Homicidal Thoughts:  No  Memory:  Immediate;   Good   Judgement:  Good  Insight:  Good  Psychomotor Activity:  Normal  Concentration:  Concentration: Good  Recall:  Good  Fund of Knowledge:  Good  Language:  Good  Akathisia:  No  Handed:  Right  AIMS (if indicated):     Assets:  Communication Skills Desire for Improvement Financial Resources/Insurance Housing Resilience Social Support Talents/Skills Transportation Vocational/Educational  ADL's:  Intact  Cognition:  WNL  Sleep:        Assessment and Plan: Depression screen [facility] 2/9 10/11/2020 06/28/2020 04/05/2020 12/12/2019 09/02/2019  Decreased Interest 0 0 0 0 0  Down, Depressed, Hopeless 1 0 1 0 0  PHQ - 2 Score 1 0 1 0 0  Altered sleeping - - - - 0  Tired, decreased energy - - - - 0  Change in appetite - - - - 0  Feeling bad or failure about yourself  - - - - 0  Trouble concentrating - - - - 0  Moving slowly or fidgety/restless - - - - 0  Suicidal thoughts - - - - 0  PHQ-9 Score - - - - 0  Difficult doing work/chores - - - - Not difficult at all    Flowsheet Row Video Visit [facility] 10/11/2020 in [facility] Video Visit from 06/28/2020 in [facility] Video Visit from 04/05/2020 in [facility]  C-SSRS RISK CATEGORY No Risk No Risk No Risk      1. GAD (generalized anxiety disorder) -  hydrOXYzine (ATARAX/VISTARIL) 10 MG tablet; Take 1 tablet (10 mg total) by mouth daily as needed for anxiety.  Dispense: 90 tablet; Refill: 0 - venlafaxine XR (EFFEXOR-XR) 75 MG 24 hr capsule; Take 3 capsules (225 mg total) by mouth daily with breakfast.  Dispense: 270 capsule; Refill: 0  2. Insomnia due to other mental disorder - hydrOXYzine (ATARAX/VISTARIL) 10 MG tablet; Take 1 tablet (10 mg total) by mouth daily as needed for anxiety.  Dispense: 90 tablet; Refill: 0  3. Severe episode of recurrent major depressive disorder, without psychotic features (HCC) - venlafaxine XR  (EFFEXOR-XR) 75 MG 24 hr capsule; Take 3 capsules (225 mg total) by mouth daily with breakfast.  Dispense: 270 capsule; Refill: 0   Follow Up Instructions: In 3 months or sooner if needed   I discussed the assessment and treatment plan with the patient. The patient was provided an opportunity to ask questions and all were answered. The patient agreed with the plan and demonstrated an understanding of the instructions.   The patient was advised to call back or seek an in-person evaluation if the symptoms worsen or if the condition fails to improve as anticipated.  I provided 17 minutes of non-face-to-face time during this encounter.   [name], MD

## 2020-11-16 ENCOUNTER — Ambulatory Visit (INDEPENDENT_AMBULATORY_CARE_PROVIDER_SITE_OTHER): Payer: Self-pay | Admitting: Plastic Surgery

## 2020-11-16 ENCOUNTER — Other Ambulatory Visit: Payer: Self-pay

## 2020-11-16 DIAGNOSIS — N62 Hypertrophy of breast: Secondary | ICD-10-CM

## 2020-11-21 ENCOUNTER — Other Ambulatory Visit: Payer: Self-pay | Admitting: Family Medicine

## 2020-11-23 ENCOUNTER — Other Ambulatory Visit: Payer: Self-pay | Admitting: Family Medicine

## 2020-11-26 ENCOUNTER — Encounter: Payer: Self-pay | Admitting: Plastic Surgery

## 2020-11-26 NOTE — Progress Notes (Signed)
   Subjective:    Patient ID: Nancy Barnes, female    DOB: 10/02/1970, 50 y.o.   MRN: [id number]  The patient is a 50 year old female here for follow-up after undergoing her breast reduction September 2021.  She had over 600 g removed from both breasts.  The incisions are healing really nicely.  She has no sign of hematoma or seroma.  No sign of infection.  The right breast has a little bit of fat necrosis at the vertical limb.   Review of Systems  Constitutional: Negative.   Eyes: Negative.   Respiratory: Negative.  Negative for chest tightness and shortness of breath.   Cardiovascular: Negative.   Gastrointestinal: Negative.   Endocrine: Negative.   Genitourinary: Negative.   Musculoskeletal: Negative.   Skin: Negative.   Psychiatric/Behavioral: Negative.        Objective:   Physical Exam Constitutional:      Appearance: Normal appearance.  Cardiovascular:     Rate and Rhythm: Normal rate.     Pulses: Normal pulses.  Pulmonary:     Effort: Pulmonary effort is normal. No respiratory distress.  Chest:    Musculoskeletal:        General: No swelling or deformity.  Skin:    General: Skin is warm.     Capillary Refill: Capillary refill takes less than 2 seconds.     Coloration: Skin is not jaundiced.     Findings: No bruising.  Neurological:     Mental Status: She is alert and oriented to person, place, and time.       Assessment & Plan:     ICD-10-CM   1. Symptomatic mammary hypertrophy  N62        She should massage this if it does not improve we can excise it.  She knows to call and let us know if she wants to do something about it or if it does not resolve.  Pictures were obtained of the patient and placed in the chart with the patient's or guardian's permission.

## 2021-01-03 ENCOUNTER — Other Ambulatory Visit: Payer: Self-pay

## 2021-01-03 ENCOUNTER — Telehealth (HOSPITAL_COMMUNITY): Payer: Self-pay | Admitting: Psychiatry

## 2021-01-03 ENCOUNTER — Telehealth (HOSPITAL_COMMUNITY): Payer: Self-pay | Admitting: *Deleted

## 2021-01-03 NOTE — Telephone Encounter (Signed)
I called the number listed in the chart at our scheduled appointment time. I called twice and each time I heard the message "welcome to [employer] the number you called has either been changed, disconnected or is longer in service". There is no other number listed for me to call. I was unable to speak with the patient today.

## 2021-01-03 NOTE — Telephone Encounter (Signed)
Pt called stating that she was unable to log in MyChart and has now rescheduled appointment to 01/10/21. Pt is requesting refill of the Effexor 75 mg (225 mg total dose). Please review.

## 2021-01-10 ENCOUNTER — Other Ambulatory Visit: Payer: Self-pay

## 2021-01-10 ENCOUNTER — Telehealth (HOSPITAL_BASED_OUTPATIENT_CLINIC_OR_DEPARTMENT_OTHER): Payer: Self-pay | Admitting: Psychiatry

## 2021-01-10 DIAGNOSIS — F99 Mental disorder, not otherwise specified: Secondary | ICD-10-CM

## 2021-01-10 DIAGNOSIS — F5105 Insomnia due to other mental disorder: Secondary | ICD-10-CM

## 2021-01-10 DIAGNOSIS — F332 Major depressive disorder, recurrent severe without psychotic features: Secondary | ICD-10-CM

## 2021-01-10 DIAGNOSIS — F411 Generalized anxiety disorder: Secondary | ICD-10-CM

## 2021-01-10 DIAGNOSIS — F902 Attention-deficit hyperactivity disorder, combined type: Secondary | ICD-10-CM

## 2021-01-10 MED ORDER — VENLAFAXINE HCL ER 75 MG PO CP24: 225.0000 mg | ORAL_CAPSULE | Freq: Every day | ORAL | 0 refills | Status: DC

## 2021-01-10 NOTE — Progress Notes (Signed)
Virtual Visit via Video Note  I connected with Nancy Barnes on 01/10/21 at 10:00 AM EST by a video enabled telemedicine application and verified that I am speaking with the correct person using two identifiers.  Location: Patient: work Provider: office   I discussed the limitations of evaluation and management by telemedicine and the availability of in person appointments. The patient expressed understanding and agreed to proceed.  History of Present Illness: Nancy Barnes shares that she lost a close friend on Christmas day and today is the funeral. She is dealing with her grief as best she can. A colleague passed away a day ago. She is understandably feeling down. Nancy Barnes's mom bought the house next to [place] grandmother's but her mom is still dragging her feet getting it ready. Nancy Barnes was supposed to move into her mom's old house. Nancy Barnes is currently renting a house but the financial burden is hard. Her daughter has moved out and is livng with her fiance. Nancy Barnes has a lot going on right now but she is managing. She denies any panic attacks. 1 month ago she had COVID and has noticed her memory is not as good as before. She loses her train of thought during conversation and not being name people she knows. It is frustrating. She did some research regarding treatment for her ADHD but has decided not to take any meds for now. She is worried about medication interactions and side effects. Her work is keeping her busy. Nancy Barnes is sleeping well but her energy remains low. Her kids bought her a nintendo switch to increase her physical activity. She has gained over 15 lbs despite not eating unhealthy. Her depression is mostly situational. She is working thru it because she recognizes it. Nancy Barnes knows in time it is going to pass. She is utilizing her supports and is thinking of making a support group at work meeting at least 1x/month. She denies SI/HI. The meds and helping and she denies Barnes.    Observations/Objective: Psychiatric Specialty Exam: ROS  There were no vitals taken for this visit.There is no height or weight on file to calculate BMI.  General Appearance: Fairly Groomed and Neat  Eye Contact:  Good  Speech:  Clear and Coherent and Normal Rate  Volume:  Normal  Mood:  Depressed  Affect:  Full Range  Thought Process:  Goal Directed, Linear, and Descriptions of Associations: Intact  Orientation:  Full (Time, Place, and Person)  Thought Content:  Logical  Suicidal Thoughts:  No  Homicidal Thoughts:  No  Memory:  Immediate;   Good  Judgement:  Good  Insight:  Good  Psychomotor Activity:  Normal  Concentration:  Concentration: Good  Recall:  Good  Fund of Knowledge:  Good  Language:  Good  Akathisia:  No  Handed:  Right  AIMS (if indicated):     Assets:  Communication Skills Desire for Improvement Financial Resources/Insurance Housing Resilience Social Support Talents/Skills Transportation Vocational/Educational  ADL's:  Intact  Cognition:  WNL  Sleep:        Assessment and Plan: Depression screen [facility] 2/9 01/10/2021 10/11/2020 06/28/2020 04/05/2020 12/12/2019  Decreased Interest 0 0 0 0 0  Down, Depressed, Hopeless 1 1 0 1 0  PHQ - 2 Score 1 1 0 1 0  Altered sleeping - - - - -  Tired, decreased energy - - - - -  Change in appetite - - - - -  Feeling bad or failure about yourself  - - - - -  Trouble  concentrating - - - - -  Moving slowly or fidgety/restless - - - - -  Suicidal thoughts - - - - -  PHQ-9 Score - - - - -  Difficult doing work/chores - - - - -    Flowsheet Row Video Visit from 01/10/2021 in [facility] Video Visit from 10/11/2020 in [facility] Video Visit from 06/28/2020 in [facility]  C-SSRS RISK CATEGORY No Risk No Risk No Risk      1. GAD (generalized anxiety disorder) - venlafaxine XR (EFFEXOR-XR) 75 MG 24  hr capsule; Take 3 capsules (225 mg total) by mouth daily with breakfast.  Dispense: 270 capsule; Refill: 0  2. Severe episode of recurrent major depressive disorder, without psychotic features (HCC) - venlafaxine XR (EFFEXOR-XR) 75 MG 24 hr capsule; Take 3 capsules (225 mg total) by mouth daily with breakfast.  Dispense: 270 capsule; Refill: 0    Follow Up Instructions: In 2-3 months or sooner if needed   I discussed the assessment and treatment plan with the patient. The patient was provided an opportunity to ask questions and all were answered. The patient agreed with the plan and demonstrated an understanding of the instructions.   The patient was advised to call back or seek an in-person evaluation if the symptoms worsen or if the condition fails to improve as anticipated.  I provided 22 minutes of non-face-to-face time during this encounter.   [name], MD

## 2021-01-22 ENCOUNTER — Other Ambulatory Visit: Payer: Self-pay | Admitting: Family Medicine

## 2021-01-22 DIAGNOSIS — J4540 Moderate persistent asthma, uncomplicated: Secondary | ICD-10-CM

## 2021-01-23 ENCOUNTER — Other Ambulatory Visit: Payer: Self-pay | Admitting: Plastic Surgery

## 2021-01-23 DIAGNOSIS — Z1231 Encounter for screening mammogram for malignant neoplasm of breast: Secondary | ICD-10-CM

## 2021-02-14 ENCOUNTER — Encounter (HOSPITAL_COMMUNITY): Payer: Self-pay | Admitting: Psychiatry

## 2021-02-14 ENCOUNTER — Other Ambulatory Visit: Payer: PRIVATE HEALTH INSURANCE

## 2021-02-14 ENCOUNTER — Ambulatory Visit
Admission: RE | Admit: 2021-02-14 | Discharge: 2021-02-14 | Disposition: A | Discharge status: Home / Self Care | Payer: PRIVATE HEALTH INSURANCE | Source: Ambulatory Visit | Attending: Plastic Surgery | Admitting: Plastic Surgery

## 2021-02-14 ENCOUNTER — Other Ambulatory Visit: Payer: Self-pay

## 2021-02-14 ENCOUNTER — Other Ambulatory Visit: Payer: Self-pay | Admitting: Plastic Surgery

## 2021-02-14 ENCOUNTER — Telehealth (HOSPITAL_BASED_OUTPATIENT_CLINIC_OR_DEPARTMENT_OTHER): Payer: Self-pay | Admitting: Psychiatry

## 2021-02-14 DIAGNOSIS — F332 Major depressive disorder, recurrent severe without psychotic features: Secondary | ICD-10-CM

## 2021-02-14 DIAGNOSIS — N6310 Unspecified lump in the right breast, unspecified quadrant: Secondary | ICD-10-CM

## 2021-02-14 DIAGNOSIS — F99 Mental disorder, not otherwise specified: Secondary | ICD-10-CM

## 2021-02-14 DIAGNOSIS — Z1231 Encounter for screening mammogram for malignant neoplasm of breast: Secondary | ICD-10-CM

## 2021-02-14 DIAGNOSIS — F411 Generalized anxiety disorder: Secondary | ICD-10-CM

## 2021-02-14 DIAGNOSIS — F5105 Insomnia due to other mental disorder: Secondary | ICD-10-CM

## 2021-02-14 MED ORDER — HYDROXYZINE HCL 10 MG PO TABS: 10.0000 mg | ORAL_TABLET | Freq: Every day | ORAL | 0 refills | Status: DC | PRN

## 2021-02-14 MED ORDER — VENLAFAXINE HCL ER 75 MG PO CP24: 225.0000 mg | ORAL_CAPSULE | Freq: Every day | ORAL | 0 refills | Status: DC

## 2021-02-14 NOTE — Progress Notes (Signed)
Virtual Visit via Video Note  I connected with Hubert Azure on 02/14/21 at  4:00 PM EST by a video enabled telemedicine application and verified that I am speaking with the correct person using two identifiers.  Location: Patient: home Provider: office   I discussed the limitations of evaluation and management by telemedicine and the availability of in person appointments. The patient expressed understanding and agreed to proceed.  History of Present Illness: Nancy Barnes is a little stressed today due to having a lot of things.  It seems to be worse because she somehow missed last night's meds. Candace was experiencing some N/V, racing thoughts, shivering and crying spells. Vistaril was not effective to resolve these symptoms. It makes her feel bad that she will never be able to stop Effexor. Her daughter has moved out and is getting married in October. She is feeling like an empty nester and is crying more. [place] stays busy and it helps to be distracted. Her mom is a source of stress. Jacora is trying to keep some distance from her mom because of how hurtful her mom is.  Her depression is ongoing. She takes things day by day. Almyra denies SI/HI.    Observations/Objective: Psychiatric Specialty Exam: ROS  There were no vitals taken for this visit.There is no height or weight on file to calculate BMI.  General Appearance: Casual  Eye Contact:  Good  Speech:  Clear and Coherent and Normal Rate  Volume:  Normal  Mood:  Anxious and Depressed  Affect:  Congruent  Thought Process:  Coherent and Descriptions of Associations: Circumstantial  Orientation:  Full (Time, Place, and Person)  Thought Content:  Rumination  Suicidal Thoughts:  No  Homicidal Thoughts:  No  Memory:  Immediate;   Good  Judgement:  Good  Insight:  Good  Psychomotor Activity:  Normal  Concentration:  Concentration: Good  Recall:  Good  Fund of Knowledge:  Good  Language:  Good  Akathisia:  No  Handed:  Right  AIMS (if  indicated):     Assets:  Communication Skills Desire for Improvement Financial Resources/Insurance Housing Intimacy Leisure Time Resilience Social Support Talents/Skills Transportation Vocational/Educational  ADL's:  Intact  Cognition:  WNL  Sleep:        Assessment and Plan:  Shaniqa does not want her meds changed today.  1. GAD (generalized anxiety disorder) - hydrOXYzine (ATARAX) 10 MG tablet; Take 1 tablet (10 mg total) by mouth daily as needed for anxiety.  Dispense: 90 tablet; Refill: 0 - venlafaxine XR (EFFEXOR-XR) 75 MG 24 hr capsule; Take 3 capsules (225 mg total) by mouth daily with breakfast.  Dispense: 270 capsule; Refill: 0  2. Insomnia due to other mental disorder - hydrOXYzine (ATARAX) 10 MG tablet; Take 1 tablet (10 mg total) by mouth daily as needed for anxiety.  Dispense: 90 tablet; Refill: 0  3. Severe episode of recurrent major depressive disorder, without psychotic features (HCC) - venlafaxine XR (EFFEXOR-XR) 75 MG 24 hr capsule; Take 3 capsules (225 mg total) by mouth daily with breakfast.  Dispense: 270 capsule; Refill: 0    Follow Up Instructions: In 2-3 months or sooner if needed   I discussed the assessment and treatment plan with the patient. The patient was provided an opportunity to ask questions and all were answered. The patient agreed with the plan and demonstrated an understanding of the instructions.   The patient was advised to call back or seek an in-person evaluation if the symptoms worsen or if  the condition fails to improve as anticipated.  I provided 23 minutes of non-face-to-face time during this encounter.   Charlcie Cradle, MD

## 2021-02-20 LAB — HM MAMMOGRAPHY

## 2021-02-22 ENCOUNTER — Encounter: Payer: Self-pay | Admitting: Family Medicine

## 2021-03-01 ENCOUNTER — Other Ambulatory Visit: Payer: Self-pay

## 2021-03-01 ENCOUNTER — Encounter: Payer: Self-pay | Admitting: Plastic Surgery

## 2021-03-01 ENCOUNTER — Ambulatory Visit (INDEPENDENT_AMBULATORY_CARE_PROVIDER_SITE_OTHER): Payer: PRIVATE HEALTH INSURANCE | Admitting: Plastic Surgery

## 2021-03-01 VITALS — BP 163/107 | HR 86 | Ht 64.5 in | Wt 193.4 lb

## 2021-03-01 DIAGNOSIS — N62 Hypertrophy of breast: Secondary | ICD-10-CM | POA: Diagnosis not present

## 2021-03-01 NOTE — Progress Notes (Signed)
Procedure Note  Preoperative Dx: fat necrosis of right breast  Postoperative Dx: Same  Procedure: kenalog inject right breast fat necrosis  Description of Procedure: Risks and complications were explained to the patient.  Consent was confirmed and the patient understands the risks and benefits.  The potential complications and alternatives were explained and the patient consents.  The patient expressed understanding the option of not having the procedure and the risks of a scar.  Time out was called and all information was confirmed to be correct.    The area was prepped and drapped.  Lidocaine 1% with epinepherine 0.1 cc was mixed with Kenalog 50/5 0.7 cc.  The mixture was injected in the fat necrosis.  A dressing was applied.  The patient was given instructions on how to care for the area and a follow up appointment.  Nancy Barnes tolerated the procedure well and there were no complications.

## 2021-03-12 ENCOUNTER — Ambulatory Visit: Payer: Self-pay | Admitting: Plastic Surgery

## 2021-03-13 ENCOUNTER — Telehealth: Payer: Self-pay | Admitting: *Deleted

## 2021-03-13 ENCOUNTER — Other Ambulatory Visit: Payer: Self-pay | Admitting: Family Medicine

## 2021-03-13 NOTE — Telephone Encounter (Signed)
Received on (02/20/2021) via of fax Mammography Request Orders from [facility].  Requesting signature and fax back.  Given to provider to sign and return. ? ?Mammography Request Order signed and faxed back to [facility].  Confirmation received and copy scanned into the chart.//AB/CMA ?

## 2021-03-21 ENCOUNTER — Encounter: Payer: Self-pay | Admitting: Internal Medicine

## 2021-03-22 NOTE — Progress Notes (Signed)
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 ?Medications (nonsteroidal anti-inflammatory drugs, Toradol) caused facial caused facial swelling/angioedema. ?Foods-pecan, walnut caused itchiness and sores in mouth. ?            Shrimp, crab caused chest tightness 3 years ago. ?Seasonal allergic rhinitis- pollens and cats. ?Latex-rash ?Allergy Skin Test- 05/19/12- POS especially grass, tree, dust mite, cockroach. ?NPSG 10/24/13- AHI 3.5/ hr, desaturation to 93%, body weight 176 lbs (ordered by Dr [name] Pane) ?HST 07/19/19- AHI  21.4/ hr, desaturation to 87%, body weight 180 lbs ?---------------------------------------------------------------------------------- ? ? 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 Used only 27% of nights between Feb 8-March 9. ?Body weight today- ?Covid vax-3 Moderna ?Flu vax-had ?Recent ED visit for neck pain Rx'd as musculoskeletal> pain meds. Says she falls asleep before putting mask on. Emphasized medical and compliance goals. ?Asthma- using rescue inhaler at least once daily. Not wheezing at night. Ok Advair. ? 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  Spends some time at her parents home.  Prefers to sleep on her stomach.  Says she gags with anything her mouth and not interested in oral appliance.  Not interested in surgical approaches.  Download  reviewed.  She notes she gasps still occasionally if she lies on her back.  She is pressure limiting so we will increase the pressure range to 5-20. ? ?ROS-see HPI   + = positive ?Constitutional:   No-   weight loss, night sweats, fevers, chills, fatigue, lassitude. ?HEENT:   No-  headaches, difficulty swallowing, tooth/dental problems, sore throat,  ?    +sneezing, itching, ear ache, +nasal congestion, post nasal drip,  ?CV:  No-   chest pain, orthopnea, PND, swelling in lower extremities, anasarca, dizziness, +palpitations ?Resp: + shortness of breath with exertion or at rest.   ?          + productive cough,  No non-productive cough,  No- coughing up of blood.   ?           No-   change in color of mucus.  + wheezing.   ?Skin: No-   rash or lesions. ?GI:  + heartburn, indigestion, abdominal pain, nausea, vomiting,  ?GU: . ?MS:  No-   joint pain or swelling.   ?Neuro-     nothing unusual ?Psych:  No- change in mood or affect. + depression or +anxiety.  No memory loss. ? ?OBJ- Physical Exam ?General- Alert, Oriented, Affect-appropriate, Distress- none acute ?Skin- rash-none, lesions- none, excoriation- none ?Lymphadenopathy- none ?Head- atraumatic ?           Eyes- Gross vision intact, PERRLA, conjunctivae and secretions clear ?           Ears- Hearing, canals-normal ?           Nose- clear, no-Septal dev, mucus, polyps, erosion, perforation  ?           Throat- Mallampati II , mucosa clear , drainage-  none, tonsils- atrophic ?Neck- flexible , trachea midline, no stridor , thyroid nl, carotid no bruit ?Chest - symmetrical excursion , unlabored ?          Heart/CV- RRR , no murmur , no gallop  , no rub, nl s1 s2 ?                          - JVD- none , edema- none, stasis changes- none, varices- none ?          Lung- clear to P&A, wheeze- none, cough-none , dullness-none, rub- none ?          Chest wall-  ?Abd-  ?Br/ Gen/ Rectal- Not done, not indicated ?Extrem- cyanosis- none, clubbing, none, atrophy- none,  strength- nl ?Neuro- grossly intact to observation ? ? ? ? ? ?

## 2021-03-25 ENCOUNTER — Encounter: Payer: Self-pay | Admitting: Internal Medicine

## 2021-03-25 ENCOUNTER — Other Ambulatory Visit: Payer: Self-pay

## 2021-03-25 ENCOUNTER — Ambulatory Visit (INDEPENDENT_AMBULATORY_CARE_PROVIDER_SITE_OTHER): Payer: PRIVATE HEALTH INSURANCE | Admitting: Internal Medicine

## 2021-03-25 VITALS — BP 124/78 | HR 83 | Temp 98.7°F | Ht 64.5 in | Wt 191.6 lb

## 2021-03-25 DIAGNOSIS — G4733 Obstructive sleep apnea (adult) (pediatric): Secondary | ICD-10-CM

## 2021-03-25 DIAGNOSIS — J4540 Moderate persistent asthma, uncomplicated: Secondary | ICD-10-CM | POA: Diagnosis not present

## 2021-03-25 MED ORDER — FLUTICASONE-SALMETEROL 250-50 MCG/ACT IN AEPB: INHALATION_SPRAY | RESPIRATORY_TRACT | 12 refills | Status: DC

## 2021-03-25 MED ORDER — EPINEPHRINE 0.3 MG/0.3ML IJ SOAJ: 0.3000 mg | INTRAMUSCULAR | 5 refills | Status: DC | PRN

## 2021-03-25 MED ORDER — ALBUTEROL SULFATE HFA 108 (90 BASE) MCG/ACT IN AERS: INHALATION_SPRAY | RESPIRATORY_TRACT | 12 refills | Status: DC

## 2021-03-25 NOTE — Assessment & Plan Note (Signed)
We will keep working with her CPAP compliance.  She does benefit when used. ?Plan-changed to nasal pillows.  Change AutoPap range to 5-20. ?

## 2021-03-25 NOTE — Patient Instructions (Signed)
Refills sent for Wixela and generic albuterol hfa inhalers ? ?Order- DME [facility]- please give nasal pillows mask. Please change pressure range to auto 5-20 ? ?Please call if we can help ?

## 2021-03-25 NOTE — Assessment & Plan Note (Signed)
No significant exacerbation.  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  Does carry EpiPen. ?Plan-refill inhalers and EpiPen ?

## 2021-03-31 ENCOUNTER — Other Ambulatory Visit: Payer: Self-pay | Admitting: Family Medicine

## 2021-04-15 ENCOUNTER — Encounter: Payer: Self-pay | Admitting: Family Medicine

## 2021-04-16 MED ORDER — IRBESARTAN 300 MG PO TABS: 300.0000 mg | ORAL_TABLET | Freq: Every day | ORAL | 1 refills | Status: DC

## 2021-05-02 ENCOUNTER — Telehealth (HOSPITAL_BASED_OUTPATIENT_CLINIC_OR_DEPARTMENT_OTHER): Payer: Self-pay | Admitting: Psychiatry

## 2021-05-02 DIAGNOSIS — F5105 Insomnia due to other mental disorder: Secondary | ICD-10-CM

## 2021-05-02 DIAGNOSIS — F332 Major depressive disorder, recurrent severe without psychotic features: Secondary | ICD-10-CM

## 2021-05-02 DIAGNOSIS — F411 Generalized anxiety disorder: Secondary | ICD-10-CM

## 2021-05-02 DIAGNOSIS — F99 Mental disorder, not otherwise specified: Secondary | ICD-10-CM

## 2021-05-02 MED ORDER — HYDROXYZINE HCL 10 MG PO TABS: 10.0000 mg | ORAL_TABLET | Freq: Every day | ORAL | 0 refills | Status: DC | PRN

## 2021-05-02 MED ORDER — VENLAFAXINE HCL ER 75 MG PO CP24
225.0000 mg | ORAL_CAPSULE | Freq: Every day | ORAL | 0 refills | Status: DC
Start: 2021-05-02 — End: 2021-07-18

## 2021-05-02 NOTE — Progress Notes (Signed)
Virtual Visit via Video Note ? ?I connected with Nancy Barnes on 05/02/21 at  3:30 PM EDT by a video enabled telemedicine application and verified that I am speaking with the correct person using two identifiers. ? ?Location: ?Patient: home ?Provider: office ?  ?I discussed the limitations of evaluation and management by telemedicine and the availability of in person appointments. The patient expressed understanding and agreed to proceed. ? ?History of Present Illness: ?"I'm ok". Nancy Barnes reports that she has numerous stressors going on. Nancy Barnes is very worried about her mom and dad. She went by to check on them yesterday. Her mom had a hypotension episode while she was there. It makes her worry about their ability to care for himself. She is managing as best she can. Last week she took care of her sister's farm. It was a peaceful and soothing experience. It was a mini-vacation for her. Nancy Barnes stays depressed but it is not worse than usual. She is sleeping well and her energy remains low. She wants to lie in bed but forces herself to get up and do what she has to. Nancy Barnes was let go from her job at [employer] due to confrontation with her Production designer, theatre/television/film. This happened 2 weeks ago. 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 Nancy Barnes denies SI/HI.  ?  ?Observations/Objective: ?Psychiatric Specialty Exam: ?ROS  ?There were no vitals taken for this visit.There is no height or weight on file to calculate BMI.  ?General Appearance: Casual and Neat  ?Eye Contact:  Good  ?Speech:  Clear and Coherent and Normal Rate  ?Volume:  Normal  ?Mood:  Anxious and Depressed  ?Affect:  Full Range  ?Thought Process:  Goal Directed, Linear, and Descriptions of Associations: Intact  ?Orientation:  Full (Time, Place, and Person)  ?Thought Content:  Logical  ?Suicidal Thoughts:  No  ?Homicidal Thoughts:  No  ?Memory:  Immediate;   Good  ?Judgement:  Good  ?Insight:  Good  ?Psychomotor Activity:   Normal  ?Concentration:  Concentration: Good  ?Recall:  Good  ?Fund of Knowledge:  Good  ?Language:  Good  ?Akathisia:  No  ?Handed:  Right  ?AIMS (if indicated):     ?Assets:  Communication Skills ?Desire for Improvement ?Financial Resources/Insurance ?Housing ?Leisure Time ?Resilience ?Social Support ?Talents/Skills ?Transportation ?Vocational/Educational  ?ADL's:  Intact  ?Cognition:  WNL  ?Sleep:     ? ? ? ?Assessment and Plan: ? ? ?  01/10/2021  ? 10:15 AM 10/11/2020  ? 10:55 AM 06/28/2020  ?  9:06 AM 04/05/2020  ?  9:11 AM 12/12/2019  ?  8:42 AM  ?Depression screen PHQ 2/9  ?Decreased Interest 0 0 0 0 0  ?Down, Depressed, Hopeless 1 1 0 1 0  ?PHQ - 2 Score 1 1 0 1 0  ? ? ?Flowsheet Row Video Visit from 01/10/2021 in [facility] Video Visit from 10/11/2020 in [facility] Video Visit from 06/28/2020 in [facility]  ?C-SSRS RISK CATEGORY No Risk No Risk No Risk  ? ?  ? ? ? ?Status of current problems: ongoing symptoms ? ?Meds:  ?1. GAD (generalized anxiety disorder) ?- hydrOXYzine (ATARAX) 10 MG tablet; Take 1 tablet (10 mg total) by mouth daily as needed for anxiety.  Dispense: 90 tablet; Refill: 0 ?- venlafaxine XR (EFFEXOR-XR) 75 MG 24 hr capsule; Take 3 capsules (225 mg total) by mouth daily with breakfast.  Dispense: 270 capsule;  Refill: 0 ? ?2. Insomnia due to other mental disorder ?- hydrOXYzine (ATARAX) 10 MG tablet; Take 1 tablet (10 mg total) by mouth daily as needed for anxiety.  Dispense: 90 tablet; Refill: 0 ? ?3. Severe episode of recurrent major depressive disorder, without psychotic features (HCC) ?- venlafaxine XR (EFFEXOR-XR) 75 MG 24 hr capsule; Take 3 capsules (225 mg total) by mouth daily with breakfast.  Dispense: 270 capsule; Refill: 0 ? ? ? ? ? ?Therapy: brief supportive therapy provided. Discussed psychosocial stressors in detail. Collaboration of Care: Other none  today ? ?Patient/Guardian was advised Release of Information must be obtained prior to any record release in order to collaborate their care with an outside provider. Patient/Guardian was advised if they have not already done so to contact the registration department to sign all necessary forms in order for [place] to release information regarding their care.  ? ?Consent: Patient/Guardian gives verbal consent for treatment and assignment of benefits for services provided during this visit. Patient/Guardian expressed understanding and agreed to proceed.  ? ? ? ? ?Follow Up Instructions: ?Follow up in 2-3 months or sooner if needed ?  ? ?I discussed the assessment and treatment plan with the patient. The patient was provided an opportunity to ask questions and all were answered. The patient agreed with the plan and demonstrated an understanding of the instructions. ?  ?The patient was advised to call back or seek an in-person evaluation if the symptoms worsen or if the condition fails to improve as anticipated. ? ?I provided 21 minutes of non-face-to-face time during this encounter. ? ? ?[name], MD ? ?

## 2021-05-03 ENCOUNTER — Ambulatory Visit (INDEPENDENT_AMBULATORY_CARE_PROVIDER_SITE_OTHER): Payer: PRIVATE HEALTH INSURANCE | Admitting: Family Medicine

## 2021-05-03 ENCOUNTER — Ambulatory Visit: Payer: Self-pay | Admitting: Plastic Surgery

## 2021-05-03 VITALS — BP 170/110 | HR 84 | Temp 98.2°F | Ht 64.5 in | Wt 192.6 lb

## 2021-05-03 DIAGNOSIS — J3089 Other allergic rhinitis: Secondary | ICD-10-CM

## 2021-05-03 DIAGNOSIS — J4540 Moderate persistent asthma, uncomplicated: Secondary | ICD-10-CM

## 2021-05-03 DIAGNOSIS — R0789 Other chest pain: Secondary | ICD-10-CM | POA: Diagnosis not present

## 2021-05-03 DIAGNOSIS — I10 Essential (primary) hypertension: Secondary | ICD-10-CM | POA: Diagnosis not present

## 2021-05-03 DIAGNOSIS — G4733 Obstructive sleep apnea (adult) (pediatric): Secondary | ICD-10-CM

## 2021-05-03 DIAGNOSIS — J302 Other seasonal allergic rhinitis: Secondary | ICD-10-CM

## 2021-05-03 DIAGNOSIS — F341 Dysthymic disorder: Secondary | ICD-10-CM

## 2021-05-03 DIAGNOSIS — F419 Anxiety disorder, unspecified: Secondary | ICD-10-CM

## 2021-05-03 DIAGNOSIS — Z13220 Encounter for screening for lipoid disorders: Secondary | ICD-10-CM

## 2021-05-03 IMAGING — MG DIGITAL SCREENING BILAT W/ CAD
4 series · 4 of 4 positions shown · non-contrast
Comparison: None.

ACR Breast Density Category a: The breast tissue is almost entirely
fatty.

CLINICAL DATA: Screening.

EXAM:
DIGITAL SCREENING BILATERAL MAMMOGRAM WITH CAD

[R CC]
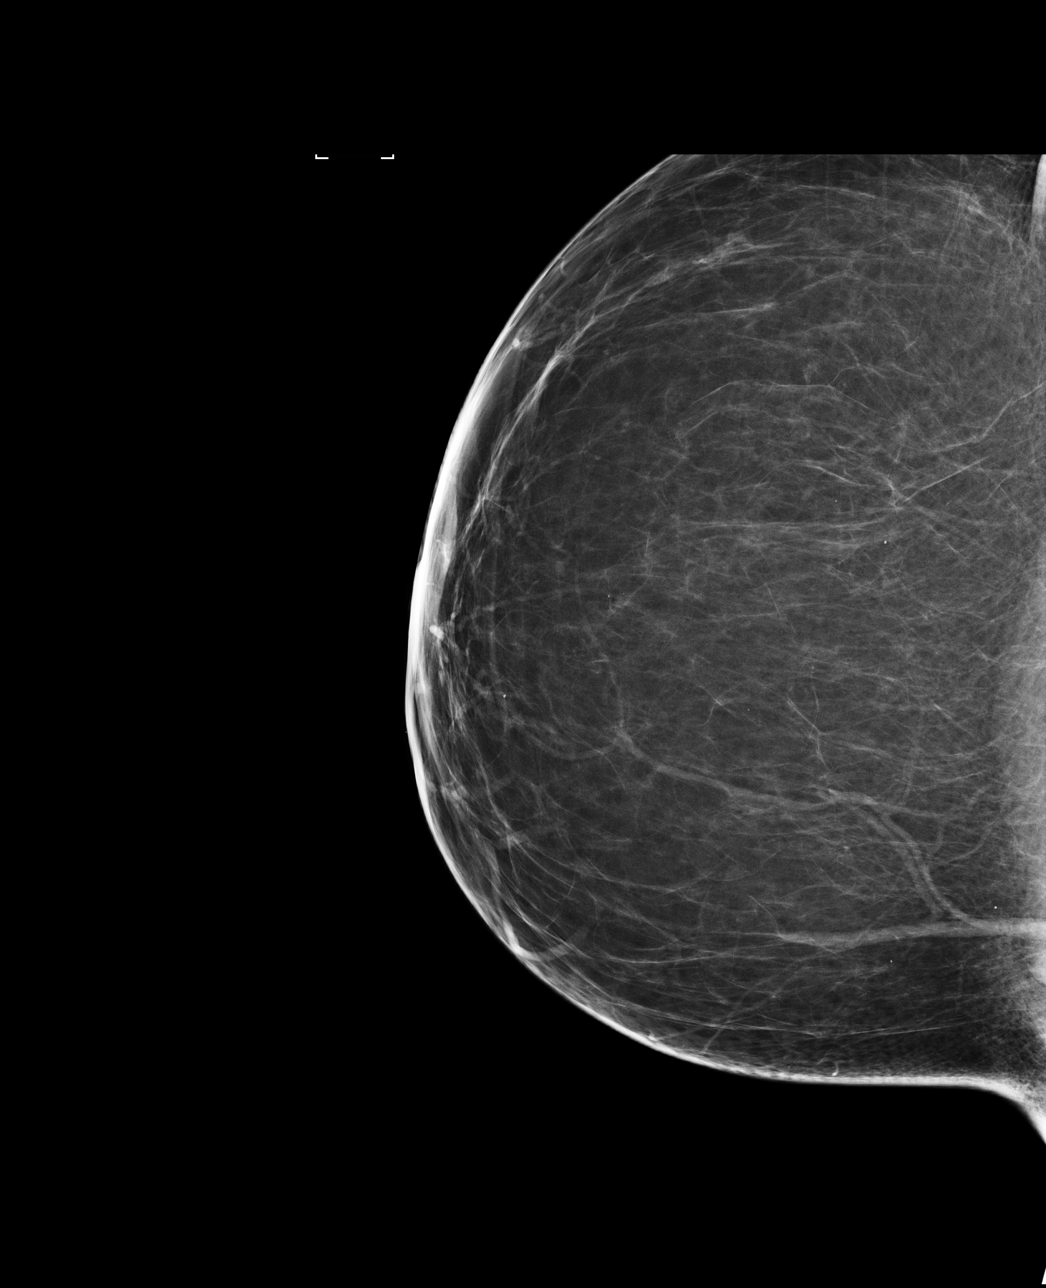

[L CC]
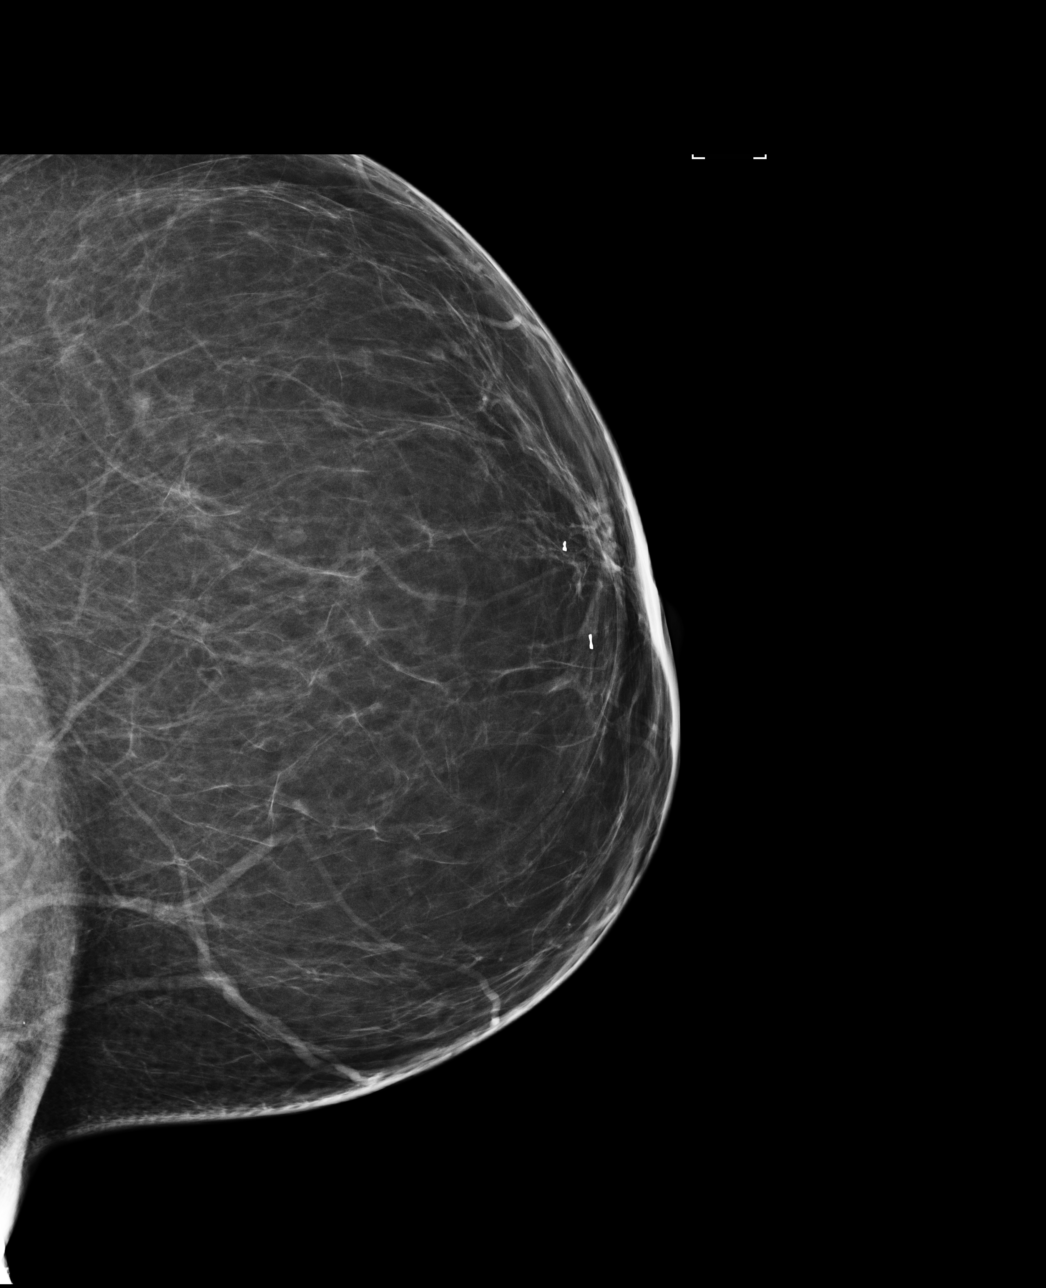

[R MLO]
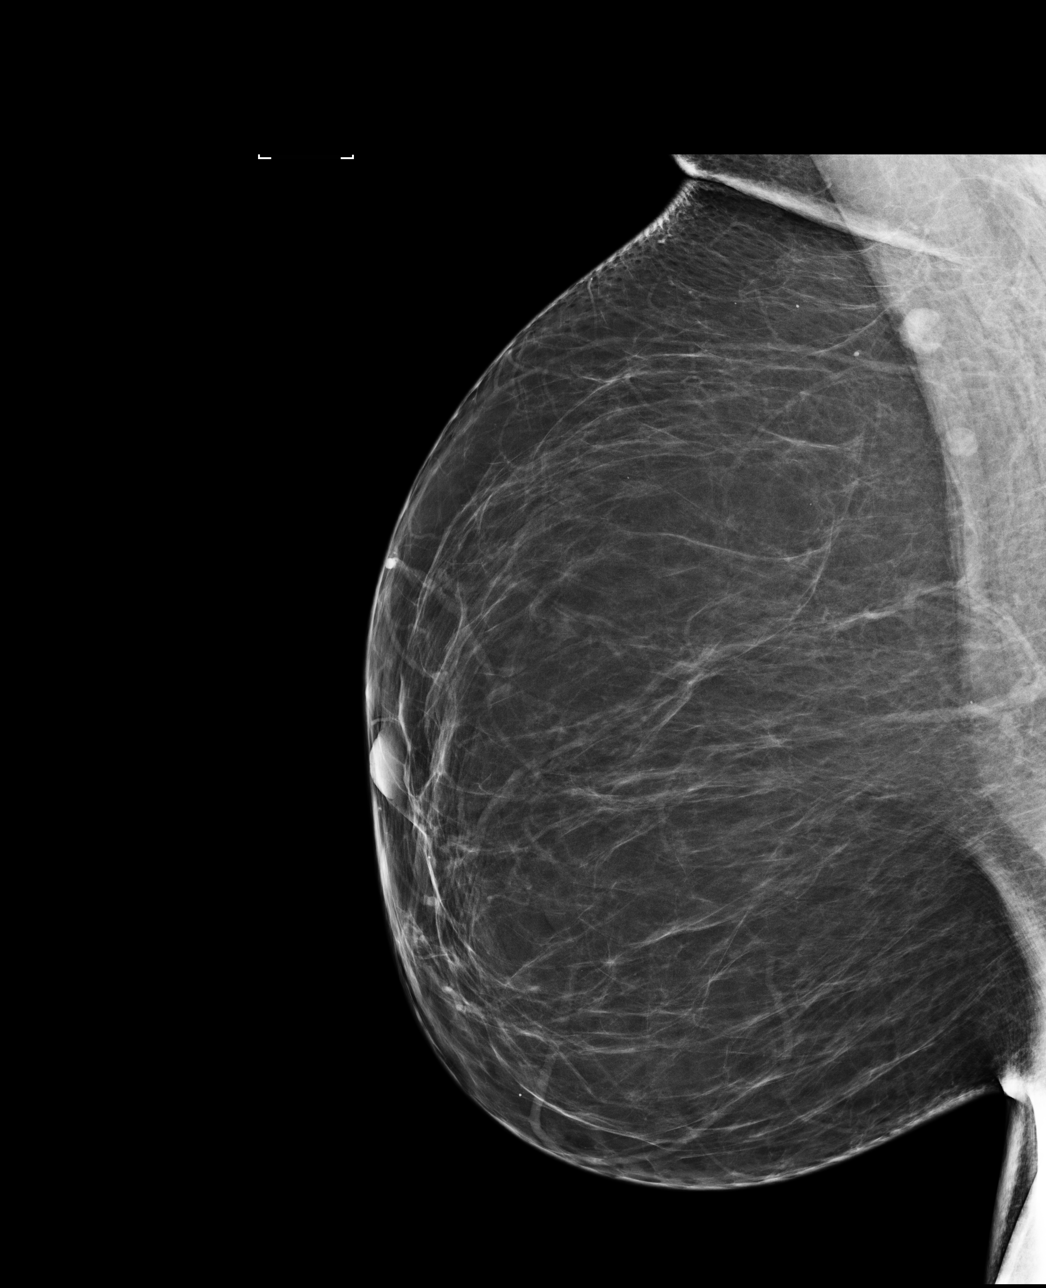

[L MLO]
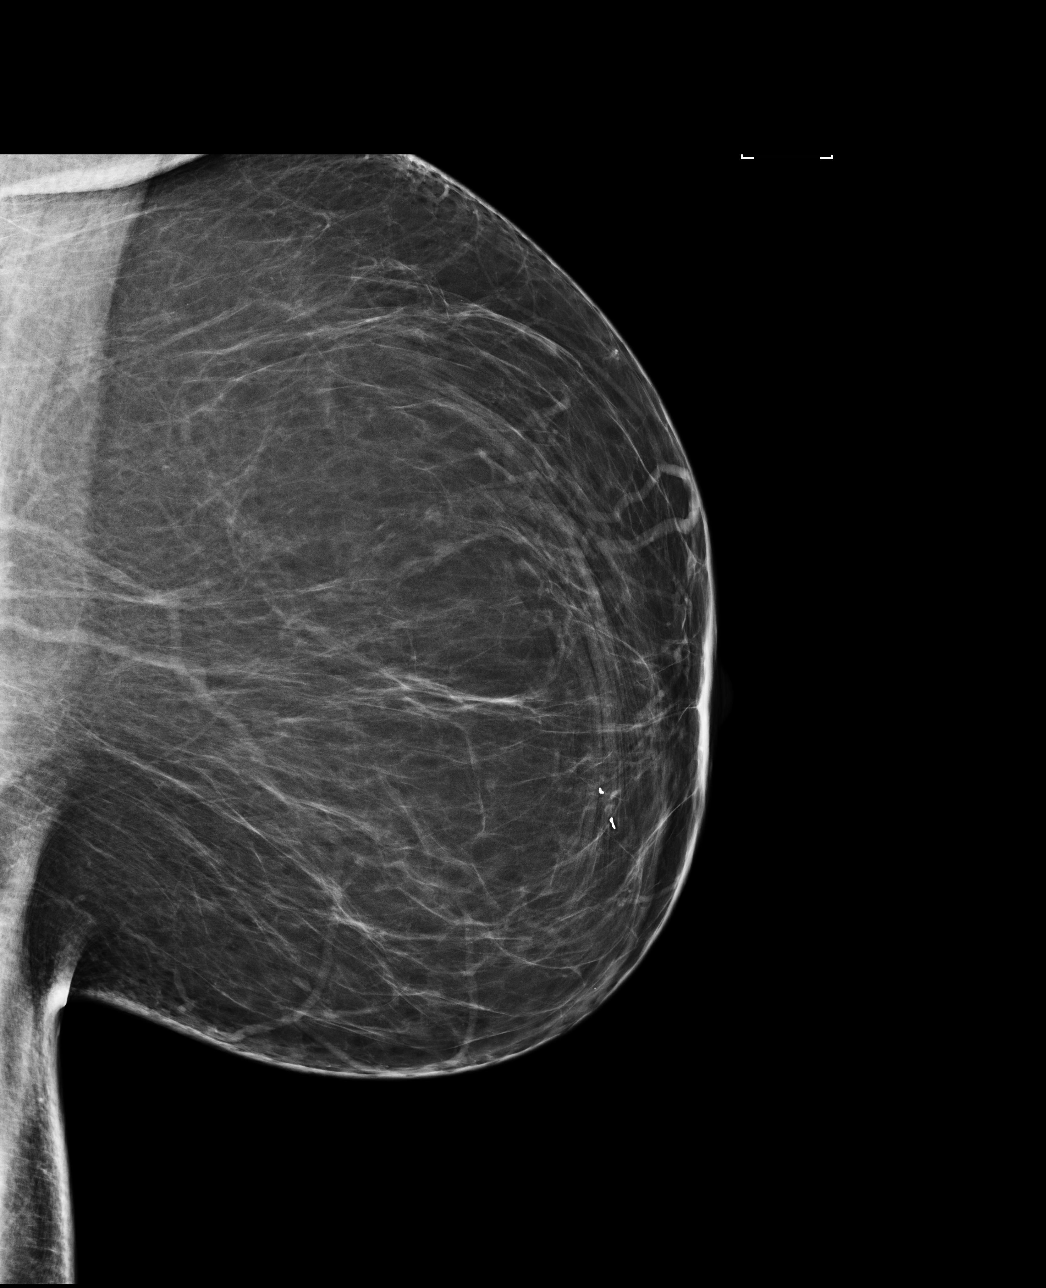

[4 of 4 positions shown; findings below may reference images not displayed]

FINDINGS: There are no findings suspicious for malignancy. Images were
processed with CAD.
IMPRESSION: No mammographic evidence of malignancy. A result letter of this
screening mammogram will be mailed directly to the patient.

RECOMMENDATION:
Screening mammogram in one year. (Code:[id number]-[id number])

BI-RADS CATEGORY  1: Negative.

## 2021-05-03 MED ORDER — HYDROCHLOROTHIAZIDE 50 MG PO TABS: 50.0000 mg | ORAL_TABLET | Freq: Every day | ORAL | 1 refills | Status: DC

## 2021-05-03 NOTE — Progress Notes (Signed)
?Nancy Barnes ?DOB: 1970-09-27 ?Encounter date: 05/03/2021 ? ?This is a 51 y.o. female who presents with ?Chief Complaint  ?Patient presents with  ? Hypertension  ?  Patient states her blood pressure has been elevated at home-high readings of 162/118  ? ? ?History of present illness: ?Last visit with me was 12/12/2019. ? ?Hypertension: bp high even when taking meds regularly. Has been very stressed lately. Discomfort in chest last few weeks; increased stress with parents - dad in a fib, mom getting LVAD. 2 weeks ago had posterior back pain that came through to front, but that left off a little. Now just sensation in center of chest. No sweating, no shortness of breath. Not putting salt on food, not eating fried foods, watching sodium. Irbesartan 300mg , hctz 12.mg. amlodipine caused swelling in legs.  High pressures at home: (Today 154/118), 154/123, 162/113, 157/116.  ? ?Left eye twitching all the time.  ? ?Sleep apnea: sleeping with cpap nightly.  ?Asthma: not needing duoneb; does use wixela regularly; budesonide when sick only.  ?Seasonal allergies: not great right now with everything outside. Zyrtec and atrovent regularly.  ? ?Anxiety: She is following regularly with psychiatry.  Increase stress with worrying about aging parents.  Atarax for insomnia/anxiety, Effexor 225 mg daily for depression/anxiety. Depression at good place per her.  ? ?Allergies  ?Allergen Reactions  ? Nsaids Anaphylaxis and Shortness Of Breath  ? Shellfish Allergy Anaphylaxis  ? Abilify [Aripiprazole] Other (See Comments)  ?  hallucinations  ? Amlodipine   ?  swelling  ? Amoxicillin Hives  ? Fish Allergy Other (See Comments)  ?  Reaction unknown  ? [Lurasidone Hcl] Other (See Comments)  ?  Uncontrollable shaking, hopelessness.  ? Levaquin [Levofloxacin In D5w] Other (See Comments)  ?  Reaction unknown  ? Other Other (See Comments)  ?  Reaction unknown to nuts  ? Penicillins Other (See Comments)  ?  Reaction unknown ?Has patient  had a PCN reaction causing immediate rash, facial/tongue/throat swelling, SOB or lightheadedness with hypotension: Yes ?Has patient had a PCN reaction causing severe rash involving mucus membranes or skin necrosis: No ?Has patient had a PCN reaction that required hospitalization: No ?Has patient had a PCN reaction occurring within the last 10 years: No ?If all of the above answers are "NO", then may proceed with Cephalosporin use.  ? Prednisone Other (See Comments)  ?  Pruritus/ hives  ? Seroquel [Quetiapine Fumarate] Other (See Comments)  ?  Patient states it made her not care about the world and she just sat down and didn't get up.  ? Ativan [Lorazepam] Anxiety  ?  Shakiness  ? Sulfa Antibiotics Rash  ? Xanax [Alprazolam] Anxiety  ?  Makes pt. anxious  ? ?Current Meds  ?Medication Sig  ? albuterol (VENTOLIN HFA) 108 (90 Base) MCG/ACT inhaler INHALE 2 PUFFS BY MOUTH EVERY 4 HOURS AS NEEDED FOR WHEEZING AND FOR SHORTNESS OF BREATH  ? budesonide (PULMICORT) 0.5 MG/2ML nebulizer solution Take 2 mLs (0.5 mg total) by nebulization daily.  ? cetirizine (ZYRTEC) 10 MG chewable tablet Chew 10 mg by mouth daily.  ? EPINEPHrine 0.3 mg/0.3 mL IJ SOAJ injection Inject 0.3 mg into the muscle as needed for anaphylaxis.  ? fluticasone-salmeterol (WIXELA INHUB) 250-50 MCG/ACT AEPB Inhale 1 puff then rinse mouth, twice daily  ? hydrochlorothiazide (HYDRODIURIL) 50 MG tablet Take 1 tablet (50 mg total) by mouth daily.  ? hydrOXYzine (ATARAX) 10 MG tablet Take 1 tablet (10 mg total) by mouth daily  as needed for anxiety.  ? ipratropium (ATROVENT) 0.03 % nasal spray Place 2 sprays into the nose 2 (two) times daily. 2 sprays each nostril twice daily  ? ipratropium-albuterol (DUONEB) 0.5-2.5 (3) MG/3ML SOLN Take 3 mLs by nebulization every 6 (six) hours as needed (shortness of breath/wheezing.).  ? irbesartan (AVAPRO) 300 MG tablet Take 1 tablet (300 mg total) by mouth daily.  ? Multiple Vitamin (MULTIVITAMIN) LIQD Take 1 mL by mouth  daily.  ? venlafaxine XR (EFFEXOR-XR) 75 MG 24 hr capsule Take 3 capsules (225 mg total) by mouth daily with breakfast.  ? [DISCONTINUED] hydrochlorothiazide (HYDRODIURIL) 12.5 MG tablet Take 1 tablet by mouth once daily  ? ? ?Review of Systems  ?Constitutional:  Negative for chills, fatigue and fever.  ?Respiratory:  Negative for cough, chest tightness (discomfort, not tightness/not pain), shortness of breath and wheezing.   ?Cardiovascular:  Negative for chest pain, palpitations and leg swelling.  ? ?Objective: ? ?BP (!) 170/110 Comment: repeat by Mykal--jaf  Pulse 84   Temp 98.2 ?F (36.8 ?C) (Oral)   Ht 5' 4.5" (1.638 m)   Wt 192 lb 9.6 oz (87.4 kg)   SpO2 94%   BMI 32.55 kg/m?   Weight: 192 lb 9.6 oz (87.4 kg)  ? ?BP Readings from Last 3 Encounters:  ?05/03/21 (!) 170/110  ?03/25/21 124/78  ?03/01/21 (!) 163/107  ? ?Wt Readings from Last 3 Encounters:  ?05/03/21 192 lb 9.6 oz (87.4 kg)  ?03/25/21 191 lb 9.6 oz (86.9 kg)  ?03/01/21 193 lb 6.4 oz (87.7 kg)  ? ? ?Physical Exam ?Constitutional:   ?   General: She is not in acute distress. ?   Appearance: She is well-developed.  ?Cardiovascular:  ?   Rate and Rhythm: Normal rate and regular rhythm.  ?   Heart sounds: Normal heart sounds. No murmur heard. ?  No friction rub.  ?Pulmonary:  ?   Effort: Pulmonary effort is normal. No respiratory distress.  ?   Breath sounds: Normal breath sounds. No wheezing or rales.  ?Musculoskeletal:  ?   Right lower leg: No edema.  ?   Left lower leg: No edema.  ?Neurological:  ?   Mental Status: She is alert and oriented to person, place, and time.  ?Psychiatric:     ?   Behavior: Behavior normal.  ? ? ?Assessment/Plan ? ?1. Primary hypertension ? ?We are to continue the irbesartan 300 mg daily and increase hydrochlorothiazide to 50 mg daily.  Continue to check blood pressures at home. ?EKG is normal sinus rhythm without any acute changes. ?We will check in with her to see how blood pressures are doing once we get lab work  results.  Further adjustments may need to be made at that point. ?- EKG 12-Lead ?- hydrochlorothiazide (HYDRODIURIL) 50 MG tablet; Take 1 tablet (50 mg total) by mouth daily.  Dispense: 90 tablet; Refill: 1 ?- TSH; Future ?- CBC with Differential/Platelet; Future ?- Comprehensive metabolic panel; Future ?- Comprehensive metabolic panel ?- CBC with Differential/Platelet ?- TSH ? ?2. Moderate persistent asthma without complication ?Asthma has been stable.  She continues on [place] daily.  She follows a specialist. ? ?3. Seasonal and perennial allergic rhinitis ?Following with specialist.  On ipratropium and Zyrtec regularly. ? ?4. OSA (obstructive sleep apnea) ?She is compliant with CPAP. ? ?5. Persistent depressive disorder ?Follows with psychiatry.  Mood has been stable although she is under increased stress with ill parents. ? ?6. Anxiety ?See above. ? ?7. Chest pressure ?-  EKG 12-Lead ? ?8. Lipid screening ?- Lipid panel; Future ?- Lipid panel ? ?Return in about 1 month (around 06/02/2021) for Chronic condition visit. ? ? ? ? ? ? ?Theodis ShoveJunell Krystyna Cleckley, MD ?

## 2021-05-04 LAB — CBC WITH DIFFERENTIAL/PLATELET
Absolute Monocytes: 394 cells/uL (ref 200–950)
Basophils Absolute: 74 cells/uL (ref 0–200)
Basophils Relative: 0.9 %
Eosinophils Absolute: 410 cells/uL (ref 15–500)
Eosinophils Relative: 5 %
HCT: 41.6 % (ref 35.0–45.0)
Hemoglobin: 14.1 g/dL (ref 11.7–15.5)
Lymphs Abs: 2845 cells/uL (ref 850–3900)
MCH: 28.1 pg (ref 27.0–33.0)
MCHC: 33.9 g/dL (ref 32.0–36.0)
MCV: 83 fL (ref 80.0–100.0)
MPV: 9.8 fL (ref 7.5–12.5)
Monocytes Relative: 4.8 %
Neutro Abs: 4477 cells/uL (ref 1500–7800)
Neutrophils Relative %: 54.6 %
Platelets: 406 10*3/uL — ABNORMAL HIGH (ref 140–400)
RBC: 5.01 10*6/uL (ref 3.80–5.10)
RDW: 14.1 % (ref 11.0–15.0)
Total Lymphocyte: 34.7 %
WBC: 8.2 10*3/uL (ref 3.8–10.8)

## 2021-05-04 LAB — COMPREHENSIVE METABOLIC PANEL
AG Ratio: 1.4 (calc) (ref 1.0–2.5)
ALT: 28 U/L (ref 6–29)
AST: 16 U/L (ref 10–35)
Albumin: 4.4 g/dL (ref 3.6–5.1)
Alkaline phosphatase (APISO): 141 U/L — ABNORMAL HIGH (ref 31–125)
BUN: 7 mg/dL (ref 7–25)
CO2: 27 mmol/L (ref 20–32)
Calcium: 9.8 mg/dL (ref 8.6–10.2)
Chloride: 101 mmol/L (ref 98–110)
Creat: 0.77 mg/dL (ref 0.50–0.99)
Globulin: 3.2 g/dL (calc) (ref 1.9–3.7)
Glucose, Bld: 99 mg/dL (ref 65–99)
Potassium: 3.8 mmol/L (ref 3.5–5.3)
Sodium: 140 mmol/L (ref 135–146)
Total Bilirubin: 0.5 mg/dL (ref 0.2–1.2)
Total Protein: 7.6 g/dL (ref 6.1–8.1)

## 2021-05-04 LAB — LIPID PANEL
Cholesterol: 230 mg/dL — ABNORMAL HIGH (ref <200)
HDL: 54 mg/dL (ref >=50)
LDL Cholesterol (Calc): 145 mg/dL (calc) — ABNORMAL HIGH
Non-HDL Cholesterol (Calc): 176 mg/dL (calc) — ABNORMAL HIGH (ref <130)
Total CHOL/HDL Ratio: 4.3 (calc) (ref <5.0)
Triglycerides: 178 mg/dL — ABNORMAL HIGH (ref <150)

## 2021-05-04 LAB — TSH: TSH: 2 mIU/L

## 2021-05-06 ENCOUNTER — Encounter: Payer: Self-pay | Admitting: Family Medicine

## 2021-05-06 DIAGNOSIS — R0609 Other forms of dyspnea: Secondary | ICD-10-CM

## 2021-05-06 DIAGNOSIS — I10 Essential (primary) hypertension: Secondary | ICD-10-CM

## 2021-05-09 NOTE — Progress Notes (Signed)
See mychart message provider documentation. Spoke with patient. Feeling better after gatorade. We will stay on 25mg  hctz until appointment 5/1.  ?

## 2021-05-09 NOTE — Telephone Encounter (Signed)
I called and spoke with patient. She has difficulty with tolerating hctz at higher doses, but did go back up to 25mg hctz today. We discussed adding CCB (maybe diltiazem at very low dose since she had swelling on amlodipine) as I worry about her tolerance with other categories.  ? ?She has asthma and decreased exercise tolerance, so does get sob with activity; felt worse with muscle aches and carrying trash cans in on higher hctz dose. We discussed getting stress testing and cardiology referral and evaluation if sx return.  ? ?She has lost job and does not have insurance. She has paperwork for [place] assistance and I have encouraged her to complete this today. ?

## 2021-05-13 ENCOUNTER — Encounter: Payer: Self-pay | Admitting: Family Medicine

## 2021-05-13 ENCOUNTER — Telehealth (INDEPENDENT_AMBULATORY_CARE_PROVIDER_SITE_OTHER): Payer: Self-pay | Admitting: Family Medicine

## 2021-05-13 VITALS — BP 138/105

## 2021-05-13 DIAGNOSIS — I10 Essential (primary) hypertension: Secondary | ICD-10-CM

## 2021-05-13 MED ORDER — DILTIAZEM HCL ER COATED BEADS 120 MG PO CP24: 120.0000 mg | ORAL_CAPSULE | Freq: Every day | ORAL | 5 refills | Status: DC

## 2021-05-13 NOTE — Progress Notes (Signed)
Virtual Visit via Video Note ? ?I connected with Nancy Barnes on 05/13/21 at 10:30 AM EDT by a video enabled telemedicine application and verified that I am speaking with the correct person using two identifiers. ? Location patient: home ?Location provider: ?[facility]  ?[address]  ?[place], [place] [address] ?Persons participating in the virtual visit: patient, provider ? ?I discussed the limitations of evaluation and management by telemedicine and the availability of in person appointments. The patient expressed understanding and agreed to proceed. ? ? ?Nancy Barnes ?DOB: 1970-07-05 ?Encounter date: 05/13/2021 ? ?This is a 51 y.o. female who presents with ?Chief Complaint  ?Patient presents with  ? Follow-up  ? ? ?History of present illness: ?Bp 142/101 HR 87 this morning. She is doing 12.5mg  hctz BID. She is staying hydrated.  She is feeling better on medication and with focusing on hydration, but still with some fatigue.  ? ?Did order vegan coq 10 to take with statin in case we start that.  ? ? ?Allergies  ?Allergen Reactions  ? Nsaids Anaphylaxis and Shortness Of Breath  ? Shellfish Allergy Anaphylaxis  ? Abilify [Aripiprazole] Other (See Comments)  ?  hallucinations  ? Amlodipine   ?  swelling  ? Amoxicillin Hives  ? Fish Allergy Other (See Comments)  ?  Reaction unknown  ? Kasandra Knudsen [Lurasidone Hcl] Other (See Comments)  ?  Uncontrollable shaking, hopelessness.  ? Levaquin [Levofloxacin In D5w] Other (See Comments)  ?  Reaction unknown  ? Other Other (See Comments)  ?  Reaction unknown to nuts  ? Penicillins Other (See Comments)  ?  Reaction unknown ?Has patient had a PCN reaction causing immediate rash, facial/tongue/throat swelling, SOB or lightheadedness with hypotension: Yes ?Has patient had a PCN reaction causing severe rash involving mucus membranes or skin necrosis: No ?Has patient had a PCN reaction that required hospitalization: No ?Has patient had a PCN reaction occurring  within the last 10 years: No ?If all of the above answers are "NO", then may proceed with Cephalosporin use.  ? Prednisone Other (See Comments)  ?  Pruritus/ hives  ? Seroquel [Quetiapine Fumarate] Other (See Comments)  ?  Patient states it made her not care about the world and she just sat down and didn't get up.  ? Ativan [Lorazepam] Anxiety  ?  Shakiness  ? Sulfa Antibiotics Rash  ? Xanax [Alprazolam] Anxiety  ?  Makes pt. anxious  ? ?Current Meds  ?Medication Sig  ? albuterol (VENTOLIN HFA) 108 (90 Base) MCG/ACT inhaler INHALE 2 PUFFS BY MOUTH EVERY 4 HOURS AS NEEDED FOR WHEEZING AND FOR SHORTNESS OF BREATH  ? budesonide (PULMICORT) 0.5 MG/2ML nebulizer solution Take 2 mLs (0.5 mg total) by nebulization daily.  ? cetirizine (ZYRTEC) 10 MG chewable tablet Chew 10 mg by mouth daily.  ? EPINEPHrine 0.3 mg/0.3 mL IJ SOAJ injection Inject 0.3 mg into the muscle as needed for anaphylaxis.  ? fluticasone-salmeterol (WIXELA INHUB) 250-50 MCG/ACT AEPB Inhale 1 puff then rinse mouth, twice daily  ? hydrochlorothiazide (HYDRODIURIL) 50 MG tablet Take 1 tablet (50 mg total) by mouth daily. (Patient taking differently: Take 25 mg by mouth daily.)  ? hydrOXYzine (ATARAX) 10 MG tablet Take 1 tablet (10 mg total) by mouth daily as needed for anxiety.  ? ipratropium (ATROVENT) 0.03 % nasal spray Place 2 sprays into the nose 2 (two) times daily. 2 sprays each nostril twice daily  ? ipratropium-albuterol (DUONEB) 0.5-2.5 (3) MG/3ML SOLN Take 3 mLs by nebulization  every 6 (six) hours as needed (shortness of breath/wheezing.).  ? irbesartan (AVAPRO) 300 MG tablet Take 1 tablet (300 mg total) by mouth daily.  ? Multiple Vitamin (MULTIVITAMIN) LIQD Take 1 mL by mouth daily.  ? venlafaxine XR (EFFEXOR-XR) 75 MG 24 hr capsule Take 3 capsules (225 mg total) by mouth daily with breakfast.  ? ? ?Review of Systems  ?Constitutional:  Negative for chills, fatigue and fever.  ?Respiratory:  Positive for shortness of breath (with activity -  deconditioning, asthma. at rest feeling ok). Negative for cough, chest tightness and wheezing.   ?Cardiovascular:  Negative for chest pain, palpitations and leg swelling.  ? ?Objective: ? ?BP (!) 138/105      ? ?BP Readings from Last 3 Encounters:  ?05/13/21 (!) 138/105  ?05/03/21 (!) 170/110  ?03/25/21 124/78  ? ?Wt Readings from Last 3 Encounters:  ?05/03/21 192 lb 9.6 oz (87.4 kg)  ?03/25/21 191 lb 9.6 oz (86.9 kg)  ?03/01/21 193 lb 6.4 oz (87.7 kg)  ? ? ?EXAM: ? ?GENERAL: alert, oriented, appears well and in no acute distress ? ?HEENT: atraumatic, conjunctiva clear, no obvious abnormalities on inspection of external nose and ears ? ?NECK: normal movements of the head and neck ? ?LUNGS: on inspection no signs of respiratory distress, breathing rate appears normal, no obvious gross SOB, gasping or wheezing ? ?CV: no obvious cyanosis ? ?MS: moves all visible extremities without noticeable abnormality ? ?PSYCH/NEURO: pleasant and cooperative, no obvious depression or anxiety, speech and thought processing grossly intact ? ? ?Assessment/Plan ? ?1. Primary hypertension ?She has worked up to hctz 25mg  daily dose. She is taking the irbesartan 300mg daily. We are going to try diltiazem 120mg  daily dose. She had swelling with amlodipine in the past,but we will monitor. She has completed paperwork for [place] assistance and will send this in today. She is starting new job but will not have health insurance so she will need some sort of assistance. She will let me know through mychart in a week how she is tolerating new med. Sooner if problems. Follow up pending update.she has been referred to cardiology and I have ordered stress testing on her. ? ?*she will likely need statin added,but she has difficulty with med tolerance, so I would prefer to get blood pressure controlled and make sure she is tolerating med before adding another med.  ? ? ? ? ? ?I discussed the assessment and treatment plan with the patient. The  patient was provided an opportunity to ask questions and all were answered. The patient agreed with the plan and demonstrated an understanding of the instructions. ?  ?The patient was advised to call back or seek an in-person evaluation if the symptoms worsen or if the condition fails to improve as anticipated. ? ?I provided 20 minutes of face-to-face time during this encounter. ? ? ? , MD  ? ? ?

## 2021-05-20 ENCOUNTER — Encounter: Payer: Self-pay | Admitting: *Deleted

## 2021-05-20 ENCOUNTER — Encounter: Payer: Self-pay | Admitting: Internal Medicine

## 2021-05-20 ENCOUNTER — Ambulatory Visit (INDEPENDENT_AMBULATORY_CARE_PROVIDER_SITE_OTHER): Payer: Self-pay | Admitting: Internal Medicine

## 2021-05-20 VITALS — BP 134/72 | HR 78 | Ht 64.5 in | Wt 191.2 lb

## 2021-05-20 DIAGNOSIS — J4540 Moderate persistent asthma, uncomplicated: Secondary | ICD-10-CM

## 2021-05-20 DIAGNOSIS — I10 Essential (primary) hypertension: Secondary | ICD-10-CM

## 2021-05-20 DIAGNOSIS — R072 Precordial pain: Secondary | ICD-10-CM

## 2021-05-20 DIAGNOSIS — R0600 Dyspnea, unspecified: Secondary | ICD-10-CM

## 2021-05-20 NOTE — Patient Instructions (Signed)
Medication Instructions:  ?No changes ?*If you need a refill on your cardiac medications before your next appointment, please call your pharmacy* ? ? ?Lab Work: ?none ? ? ?Testing/Procedures: ?Your physician has requested that you have an echocardiogram. Echocardiography is a painless test that uses sound waves to create images of your heart. It provides your doctor with information about the size and shape of your heart and how well your heart?s chambers and valves are working. This procedure takes approximately one hour. There are no restrictions for this procedure. ? ?Your physician has requested that you have an exercise tolerance test. For further information please visit [email or web address] Please also follow instruction sheet, as given. ? ? ?Follow-Up: ?At [facility], you and your health needs are our priority.  As part of our continuing mission to provide you with exceptional heart care, we have created designated Provider Care Teams.  These Care Teams include your primary Cardiologist (physician) and Advanced Practice Providers (APPs -  Physician Assistants and Nurse Practitioners) who all work together to provide you with the care you need, when you need it. ? ? ?Important Information About Sugar ? ? ? ? ?  ?

## 2021-05-20 NOTE — Progress Notes (Signed)
?Cardiology Office Note:   ? ?Date:  05/20/2021  ? ?ID:  Nancy Barnes, DOB 06/29/1970, MRN [id number] ? ?PCP:  Nancy Banker, MD  ? ?[facility] Providers ?Cardiologist:  Nancy Skeans, MD ?Referring MD: Nancy Banker, MD  ? ?Chief Complaint/Reason for Referral: Hypertension, chest pain, and dyspnea on exertion ? ?ASSESSMENT:   ? ?1. Dyspnea, unspecified type   ?2. Primary hypertension   ?3. Moderate persistent asthma without complication   ?4. Precordial pain   ? ? ?PLAN:   ? ?In order of problems listed above: ?1.  Dyspnea: The patient is followed by pulmonary.  Her asthma is under relatively good control.  We will obtain an echocardiogram to evaluate further. ?2.  Hypertension: Blood pressure is under good control on her current regimen. ?3.  Moderate persistent asthma: This is under good control and being followed by the pulmonary division. ?4.  Chest pain: We will obtain an exercise treadmill stress test to evaluate further. ? ? ? ?     ? ?Shared Decision Making/Informed Consent ?The risks [chest pain, shortness of breath, cardiac arrhythmias, dizziness, blood pressure fluctuations, myocardial infarction, stroke/transient ischemic attack, and life-threatening complications (estimated to be 1 in 10,000)], benefits (risk stratification, diagnosing coronary artery disease, treatment guidance) and alternatives of an exercise tolerance test were discussed in detail with Nancy Barnes and she agrees to proceed.  ? ?Dispo:  Return if symptoms worsen or fail to improve.  ? ?  ? ?Medication Adjustments/Labs and Tests Ordered: ?Current medicines are reviewed at length with the patient today.  Concerns regarding medicines are outlined above. ? ?The following changes have been made:  no change  ? ?Labs/tests ordered: ?Orders Placed This Encounter  ?Procedures  ? Exercise Tolerance Test  ? ECHOCARDIOGRAM COMPLETE  ? ? ?Medication Changes: ?No orders of the defined types were placed in this  encounter. ? ? ? ?Current medicines are reviewed at length with the patient today.  The patient does not have concerns regarding medicines. ? ? ?History of Present Illness:   ? ?FOCUSED PROBLEM LIST:   ?1.  Hypertension ?2.  Anaphylaxis in response to NSAIDs ?3.  Obstructive sleep apnea not compliant with CPAP ?4.  Moderate persistent asthma ?5.  Pneumomediastinum thought secondary to bronchitis (2015) ? ?The patient is a 51 y.o. female with the indicated medical history here for for cardiology consultation regarding shortness of breath and hypertension.  The patient was seen by her primary care provider recently and was doing relatively well.  Her blood pressure was noted to be 138/105.  She also reported shortness of breath with exertion.  An echocardiogram stress test was ordered but she lost her medical insurance so this was not done.  She saw pulmonary recently and reported that she was using her rescue inhaler once a day. ? ?The patient tells me a few weeks ago she developed onset chest pain that lasted a few weeks.  It was not exacerbated by exertion.  There is seem to be no alleviating factors.  Then all of a sudden it went away.  She did not feel like it was pleuritic in nature.  Her asthma has been relatively stable.  She is on a scheduled inhaler.  She denies any other cardiovascular symptomatology including palpitations, paroxysmal nocturnal dyspnea, orthopnea.  Her blood pressure regimen has been adjusted recently and she is down to hydrochlorothiazide 12.5 mg due to her inability to tolerate higher dose.  She has required no emergency room visits or hospitalizations. ?   ?  ? ? ? ?  Current Medications: ?Current Meds  ?Medication Sig  ? albuterol (VENTOLIN HFA) 108 (90 Base) MCG/ACT inhaler INHALE 2 PUFFS BY MOUTH EVERY 4 HOURS AS NEEDED FOR WHEEZING AND FOR SHORTNESS OF BREATH  ? budesonide (PULMICORT) 0.5 MG/2ML nebulizer solution Take 2 mLs (0.5 mg total) by nebulization daily.  ? cetirizine (ZYRTEC)  10 MG chewable tablet Chew 10 mg by mouth daily.  ? diltiazem (CARDIZEM CD) 120 MG 24 hr capsule Take 1 capsule (120 mg total) by mouth daily.  ? EPINEPHrine 0.3 mg/0.3 mL IJ SOAJ injection Inject 0.3 mg into the muscle as needed for anaphylaxis.  ? fluticasone-salmeterol (WIXELA INHUB) 250-50 MCG/ACT AEPB Inhale 1 puff then rinse mouth, twice daily  ? hydrochlorothiazide (HYDRODIURIL) 25 MG tablet Take 12.5 mg by mouth 2 (two) times daily.  ? hydrOXYzine (ATARAX) 10 MG tablet Take 1 tablet (10 mg total) by mouth daily as needed for anxiety.  ? ipratropium (ATROVENT) 0.03 % nasal spray Place 2 sprays into the nose 2 (two) times daily. 2 sprays each nostril twice daily  ? ipratropium-albuterol (DUONEB) 0.5-2.5 (3) MG/3ML SOLN Take 3 mLs by nebulization every 6 (six) hours as needed (shortness of breath/wheezing.).  ? irbesartan (AVAPRO) 300 MG tablet Take 1 tablet (300 mg total) by mouth daily.  ? Multiple Vitamin (MULTIVITAMIN) LIQD Take 1 mL by mouth daily.  ? venlafaxine XR (EFFEXOR-XR) 75 MG 24 hr capsule Take 3 capsules (225 mg total) by mouth daily with breakfast.  ? [DISCONTINUED] hydrochlorothiazide (HYDRODIURIL) 50 MG tablet Take 1 tablet (50 mg total) by mouth daily. (Patient taking differently: Take 25 mg by mouth daily.)  ?  ? ?Allergies:    ?Nsaids, Shellfish allergy, Abilify [aripiprazole], Amlodipine, Amoxicillin, Fish allergy, Latuda [lurasidone hcl], Levaquin [levofloxacin in d5w], Other, Penicillins, Prednisone, Seroquel [quetiapine fumarate], Ativan [lorazepam], Sulfa antibiotics, and Xanax [alprazolam]  ? ?Social History:   ?Social History  ? ?Tobacco Use  ? Smoking status: Never  ? Smokeless tobacco: Never  ?Vaping Use  ? Vaping Use: Never used  ?Substance Use Topics  ? Alcohol use: Not Currently  ?  Comment: socially  ? Drug use: No  ?  ? ?Family Hx: ?Family History  ?Problem Relation Age of Onset  ? Heart attack Mother 74  ?     Bypass x5  ? Allergies Mother   ? CAD Mother   ? High blood  pressure Mother   ? High Cholesterol Mother   ? Heart disease Mother   ? Arthritis Mother   ? Heart failure Mother   ? Diabetes Mellitus II Father   ? Hypertension Father   ? Heart disease Father   ? CAD Father   ? Other Father   ?     Agent orange exposure  ? Arthritis Father   ? Depression Father   ? Atrial fibrillation Father   ? Allergies Maternal Grandmother   ? Atrial fibrillation Maternal Grandmother   ? Stroke Maternal Grandmother   ? Asthma Maternal Grandfather   ? Asthma Paternal Grandmother   ? Asthma Paternal Grandfather   ? Allergies Daughter   ? Psoriasis Daughter   ? Arthritis Daughter   ?     psoriatic  ? Hypothyroidism Daughter   ? Suicidality Neg Hx   ? Anxiety disorder Neg Hx   ?  ? ?Review of Systems:   ?Please see the history of present illness.    ?All other systems reviewed and are negative. ?  ? ? ?EKGs/Labs/Other Test Reviewed:   ? ?  EKG:  EKG performed May 03, 2021 that I personally reviewed demonstrates sinus rhythm. ? ?Prior CV studies: ?None available ? ?Other studies Reviewed: ?Review of the additional studies/records demonstrates:  ? ?Chest CT 2013 with pneumomediastinum but no aortic atherosclerosis or aneurysm ? ?Sleep study 2021 moderate obstructive sleep apnea ? ?PFTs 2014 relatively normal ? ?Recent Labs: ?05/03/2021: ALT 28; BUN 7; Creat 0.77; Hemoglobin 14.1; Platelets 406; Potassium 3.8; Sodium 140; TSH 2.00  ? ?Recent Lipid Panel ?Lab Results  ?Component Value Date/Time  ? CHOL 230 (H) 05/03/2021 03:55 PM  ? TRIG 178 (H) 05/03/2021 03:55 PM  ? HDL 54 05/03/2021 03:55 PM  ? LDLCALC 145 (H) 05/03/2021 03:55 PM  ? ? ?Risk Assessment/Calculations:   ? ?  ?    ? ?Physical Exam:   ? ?VS:  BP 134/72   Pulse 78   Ht 5' 4.5" (1.638 m)   Wt 191 lb 3.2 oz (86.7 kg)   SpO2 98%   BMI 32.31 kg/m?    ?Wt Readings from Last 3 Encounters:  ?05/20/21 191 lb 3.2 oz (86.7 kg)  ?05/03/21 192 lb 9.6 oz (87.4 kg)  ?03/25/21 191 lb 9.6 oz (86.9 kg)  ?  ?GENERAL:  No apparent distress,  AOx3 ?HEENT:  No carotid bruits, +2 carotid impulses, no scleral icterus ?CAR: RRR no murmurs, gallops, rubs, or thrills ?RES:  Clear to auscultation bilaterally ?ABD:  Soft, nontender, nondistended, positive bowel sounds x 4 ?VASC:  +2 r

## 2021-05-24 ENCOUNTER — Ambulatory Visit: Payer: PRIVATE HEALTH INSURANCE | Admitting: Family Medicine

## 2021-05-26 ENCOUNTER — Other Ambulatory Visit: Payer: Self-pay | Admitting: Family Medicine

## 2021-06-11 ENCOUNTER — Ambulatory Visit (INDEPENDENT_AMBULATORY_CARE_PROVIDER_SITE_OTHER): Payer: PRIVATE HEALTH INSURANCE

## 2021-06-11 ENCOUNTER — Ambulatory Visit (HOSPITAL_COMMUNITY): Payer: Self-pay | Attending: Cardiovascular Disease

## 2021-06-11 DIAGNOSIS — R0600 Dyspnea, unspecified: Secondary | ICD-10-CM | POA: Insufficient documentation

## 2021-06-11 DIAGNOSIS — R072 Precordial pain: Secondary | ICD-10-CM

## 2021-06-11 DIAGNOSIS — I10 Essential (primary) hypertension: Secondary | ICD-10-CM

## 2021-06-11 LAB — EXERCISE TOLERANCE TEST
Angina Index: 0
Estimated workload: 7
Exercise duration (min): 6 min
Exercise duration (sec): 0 s
MPHR: 171 {beats}/min
Peak HR: 151 {beats}/min
Percent HR: 88 %
RPE: 16
Rest HR: 82 {beats}/min

## 2021-06-11 LAB — ECHOCARDIOGRAM COMPLETE
Area-P 1/2: 3.78 cm2
S' Lateral: 3.05 cm

## 2021-07-18 ENCOUNTER — Encounter (HOSPITAL_COMMUNITY): Payer: Self-pay | Admitting: Psychiatry

## 2021-07-18 ENCOUNTER — Telehealth (HOSPITAL_BASED_OUTPATIENT_CLINIC_OR_DEPARTMENT_OTHER): Payer: PRIVATE HEALTH INSURANCE | Admitting: Psychiatry

## 2021-07-18 DIAGNOSIS — F411 Generalized anxiety disorder: Secondary | ICD-10-CM | POA: Diagnosis not present

## 2021-07-18 DIAGNOSIS — F99 Mental disorder, not otherwise specified: Secondary | ICD-10-CM

## 2021-07-18 DIAGNOSIS — F332 Major depressive disorder, recurrent severe without psychotic features: Secondary | ICD-10-CM

## 2021-07-18 DIAGNOSIS — F5105 Insomnia due to other mental disorder: Secondary | ICD-10-CM | POA: Diagnosis not present

## 2021-07-18 MED ORDER — VENLAFAXINE HCL ER 75 MG PO CP24: 225.0000 mg | ORAL_CAPSULE | Freq: Every day | ORAL | 0 refills | Status: DC

## 2021-07-18 NOTE — Progress Notes (Signed)
Virtual Visit via Video Note  I connected with Nancy Barnes on 07/18/21 at  3:00 PM EDT by a video enabled telemedicine application and verified that I am speaking with the correct person using two identifiers.  Location: Patient: office at work Provider: office   I discussed the limitations of evaluation and management by telemedicine and the availability of in person appointments. The patient expressed understanding and agreed to proceed.  History of Present Illness: "It's ok. Its been busy". Nancy Barnes has been studying and working. She is getting ready to take her test for insurance. Nancy Barnes states her anxiety "has not been bothering me". Her depression is "ok". She has stuff on her mind and sometimes that makes her feel down. She has been spending a lot of time with her family and it has been really nice. Her sleep was good until the last few nights. She is not sure if it is due to her later workng hours or the heat. Nancy Barnes denies SI/HI. She not has not taken Vistaril in a few months.    Observations/Objective: Psychiatric Specialty Exam: ROS  There were no vitals taken for this visit.There is no height or weight on file to calculate BMI.  General Appearance: Casual, Neat, and Well Groomed  Eye Contact:  Good  Speech:  Clear and Coherent and Normal Rate  Volume:  Normal  Mood:  Euthymic  Affect:  Full Range  Thought Process:  Goal Directed, Linear, and Descriptions of Associations: Intact  Orientation:  Full (Time, Place, and Person)  Thought Content:  Logical  Suicidal Thoughts:  No  Homicidal Thoughts:  No  Memory:  Immediate;   Good  Judgement:  Good  Insight:  Good  Psychomotor Activity:  Normal  Concentration:  Concentration: Good  Recall:  Good  Fund of Knowledge:  Good  Language:  Good  Akathisia:  No  Handed:  Right  AIMS (if indicated):     Assets:  Communication Skills Desire for Improvement Financial Resources/Insurance Housing Intimacy Leisure  Time Resilience Social Support Talents/Skills Transportation Vocational/Educational  ADL's:  Intact  Cognition:  WNL  Sleep:        Assessment and Plan:     07/18/2021    3:19 PM 05/03/2021    2:22 PM 01/10/2021   10:15 AM 10/11/2020   10:55 AM 06/28/2020    9:06 AM  Depression screen PHQ 2/9  Decreased Interest 0 1 0 0 0  Down, Depressed, Hopeless 1 1 1 1  0  PHQ - 2 Score 1 2 1 1  0  Altered sleeping  1     Tired, decreased energy  3     Change in appetite  0     Feeling bad or failure about yourself   1     Trouble concentrating  0     Moving slowly or fidgety/restless  0     Suicidal thoughts  0     PHQ-9 Score  7     Difficult doing work/chores  Somewhat difficult       Flowsheet Row Video Visit from 07/18/2021 in [facility] Video Visit from 01/10/2021 in [facility] Video Visit from 10/11/2020 in [facility]  C-SSRS RISK CATEGORY No Risk No Risk No Risk          Status of current problems: stable  Meds: no refill on Vistaril as she rarely takes it 1. GAD (generalized anxiety disorder) - venlafaxine XR (EFFEXOR-XR) 75 MG  24 hr capsule; Take 3 capsules (225 mg total) by mouth daily with breakfast.  Dispense: 270 capsule; Refill: 0  2. Insomnia due to other mental disorder  3. Severe episode of recurrent major depressive disorder, without psychotic features (HCC) - venlafaxine XR (EFFEXOR-XR) 75 MG 24 hr capsule; Take 3 capsules (225 mg total) by mouth daily with breakfast.  Dispense: 270 capsule; Refill: 0     Labs: noe    Therapy: brief supportive therapy provided.     Collaboration of Care: Other none  Patient/Guardian was advised Release of Information must be obtained prior to any record release in order to collaborate their care with an outside provider. Patient/Guardian was advised if they have not already done so to contact the  registration department to sign all necessary forms in order for [place] to release information regarding their care.   Consent: Patient/Guardian gives verbal consent for treatment and assignment of benefits for services provided during this visit. Patient/Guardian expressed understanding and agreed to proceed.     Follow Up Instructions: Follow up in 2-3 months or sooner if needed    I discussed the assessment and treatment plan with the patient. The patient was provided an opportunity to ask questions and all were answered. The patient agreed with the plan and demonstrated an understanding of the instructions.   The patient was advised to call back or seek an in-person evaluation if the symptoms worsen or if the condition fails to improve as anticipated.  I provided 15 minutes of non-face-to-face time during this encounter.   [name], MD

## 2021-08-31 ENCOUNTER — Emergency Department (HOSPITAL_COMMUNITY)
Admission: EM | Admit: 2021-08-31 | Discharge: 2021-08-31 | Disposition: A | Discharge status: Home / Self Care | Payer: Self-pay | Attending: Emergency Medicine | Admitting: Emergency Medicine

## 2021-08-31 ENCOUNTER — Emergency Department (HOSPITAL_COMMUNITY): Payer: Self-pay

## 2021-08-31 ENCOUNTER — Encounter (HOSPITAL_COMMUNITY): Payer: Self-pay

## 2021-08-31 DIAGNOSIS — E876 Hypokalemia: Secondary | ICD-10-CM | POA: Insufficient documentation

## 2021-08-31 DIAGNOSIS — K562 Volvulus: Secondary | ICD-10-CM

## 2021-08-31 DIAGNOSIS — I10 Essential (primary) hypertension: Secondary | ICD-10-CM | POA: Insufficient documentation

## 2021-08-31 DIAGNOSIS — Z79899 Other long term (current) drug therapy: Secondary | ICD-10-CM | POA: Insufficient documentation

## 2021-08-31 DIAGNOSIS — Z7951 Long term (current) use of inhaled steroids: Secondary | ICD-10-CM | POA: Insufficient documentation

## 2021-08-31 DIAGNOSIS — Q438 Other specified congenital malformations of intestine: Secondary | ICD-10-CM | POA: Insufficient documentation

## 2021-08-31 DIAGNOSIS — J45909 Unspecified asthma, uncomplicated: Secondary | ICD-10-CM | POA: Insufficient documentation

## 2021-08-31 DIAGNOSIS — R739 Hyperglycemia, unspecified: Secondary | ICD-10-CM | POA: Insufficient documentation

## 2021-08-31 LAB — COMPREHENSIVE METABOLIC PANEL
ALT: 38 U/L (ref 0–44)
AST: 22 U/L (ref 15–41)
Albumin: 3.9 g/dL (ref 3.5–5.0)
Alkaline Phosphatase: 125 U/L (ref 38–126)
Anion gap: 7 (ref 5–15)
BUN: 8 mg/dL (ref 6–20)
CO2: 27 mmol/L (ref 22–32)
Calcium: 8.7 mg/dL — ABNORMAL LOW (ref 8.9–10.3)
Chloride: 104 mmol/L (ref 98–111)
Creatinine, Ser: 0.66 mg/dL (ref 0.44–1.00)
GFR, Estimated: >60 mL/min (ref >60)
Glucose, Bld: 131 mg/dL — ABNORMAL HIGH (ref 70–99)
Potassium: 3.1 mmol/L — ABNORMAL LOW (ref 3.5–5.1)
Sodium: 138 mmol/L (ref 135–145)
Total Bilirubin: 0.8 mg/dL (ref 0.3–1.2)
Total Protein: 7.3 g/dL (ref 6.5–8.1)

## 2021-08-31 LAB — CBC WITH DIFFERENTIAL/PLATELET
Abs Immature Granulocytes: 0.03 10*3/uL (ref 0.00–0.07)
Basophils Absolute: 0 10*3/uL (ref 0.0–0.1)
Basophils Relative: 1 %
Eosinophils Absolute: 0.3 10*3/uL (ref 0.0–0.5)
Eosinophils Relative: 5 %
HCT: 38.4 % (ref 36.0–46.0)
Hemoglobin: 12.8 g/dL (ref 12.0–15.0)
Immature Granulocytes: 1 %
Lymphocytes Relative: 30 %
Lymphs Abs: 1.8 10*3/uL (ref 0.7–4.0)
MCH: 28 pg (ref 26.0–34.0)
MCHC: 33.3 g/dL (ref 30.0–36.0)
MCV: 84 fL (ref 80.0–100.0)
Monocytes Absolute: 0.3 10*3/uL (ref 0.1–1.0)
Monocytes Relative: 5 %
Neutro Abs: 3.6 10*3/uL (ref 1.7–7.7)
Neutrophils Relative %: 58 %
Platelets: 357 10*3/uL (ref 150–400)
RBC: 4.57 MIL/uL (ref 3.87–5.11)
RDW: 13.2 % (ref 11.5–15.5)
WBC: 6 10*3/uL (ref 4.0–10.5)
nRBC: 0 % (ref 0.0–0.2)

## 2021-08-31 LAB — URINALYSIS, ROUTINE W REFLEX MICROSCOPIC
Bilirubin Urine: NEGATIVE
Glucose, UA: NEGATIVE mg/dL
Ketones, ur: NEGATIVE mg/dL
Nitrite: NEGATIVE
Protein, ur: NEGATIVE mg/dL
Specific Gravity, Urine: 1.009 (ref 1.005–1.030)
pH: 6 (ref 5.0–8.0)

## 2021-08-31 LAB — LIPASE, BLOOD: Lipase: 42 U/L (ref 11–51)

## 2021-08-31 MED ORDER — IOHEXOL 300 MG/ML  SOLN
100.0000 mL | Freq: Once | INTRAMUSCULAR | Status: AC | PRN
Start: 2021-08-31 — End: 2021-08-31
  Administered 2021-08-31: 100 mL via INTRAVENOUS

## 2021-08-31 NOTE — Discharge Instructions (Addendum)
Refer to the information below about your condition.  I recommend Tylenol if needed for pain relief as you cannot tolerate NSAIDs.  Plan to reach out to Dr. Henreitta Leber if your symptoms are not resolved over the next 10 days.  Return here sooner for further evaluation if you develop vomiting, fevers or worsening pain symptoms.

## 2021-08-31 NOTE — ED Provider Notes (Signed)
[facility] EMERGENCY DEPARTMENT Provider Note   CSN: [id number] Arrival date & time: 08/31/21  1156     History  Chief Complaint  Patient presents with   Abdominal Pain    Nancy Barnes is a 51 y.o. female with medical history significant for asthma, hypertension, chronic back and neck pain presenting for evaluation of right lower quadrant pain which started 2 days ago.  She endorses very localized pain which is better with holding this location, worse when she lets go, she has had no fevers or chills, denies nausea vomiting or diarrhea.  She had a regular bowel movement prior to arrival which did not affect her symptoms.  Her appetite has been adequate.  She felt a little bit better yesterday and then her symptoms worsen again today.  She is concerned about acute appendicitis.  She denies dysuria, flank pain, hematuria, no vaginal discharge or other complaints, she is not had a period since a surgical ablation years ago.  The history is provided by the patient.       Home Medications Prior to Admission medications   Medication Sig Start Date End Date Taking? Authorizing Provider  albuterol (VENTOLIN HFA) 108 (90 Base) MCG/ACT inhaler INHALE 2 PUFFS BY MOUTH EVERY 4 HOURS AS NEEDED FOR WHEEZING AND FOR SHORTNESS OF BREATH Patient taking differently: Inhale 2 puffs into the lungs every 4 (four) hours as needed for wheezing or shortness of breath. 03/25/21  Yes Young, Clinton D, MD  budesonide (PULMICORT) 0.5 MG/2ML nebulizer solution Take 2 mLs (0.5 mg total) by nebulization daily. Patient taking differently: Take 0.5 mg by nebulization daily as needed (shortness of breath (winter months)). 12/12/19  Yes Koberlein, Paris Lore, MD  cetirizine (ZYRTEC) 10 MG chewable tablet Chew 10 mg by mouth daily.   Yes [provider]  diltiazem (CARDIZEM CD) 120 MG 24 hr capsule Take 1 capsule (120 mg total) by mouth daily. 05/13/21  Yes Koberlein, Junell C, MD  EPINEPHrine 0.3 mg/0.3 mL  IJ SOAJ injection Inject 0.3 mg into the muscle as needed for anaphylaxis. 03/25/21  Yes Young, Joni Fears D, MD  fluticasone-salmeterol [facility] INHUB) 250-50 MCG/ACT AEPB Inhale 1 puff then rinse mouth, twice daily 03/25/21  Yes Young, Clinton D, MD  hydrOXYzine (ATARAX) 10 MG tablet Take 1 tablet (10 mg total) by mouth daily as needed for anxiety. 05/02/21  Yes [name], MD  ipratropium (ATROVENT) 0.03 % nasal spray Place 2 sprays into the nose 2 (two) times daily. 2 sprays each nostril twice daily 12/12/19  Yes Koberlein, Junell C, MD  ipratropium-albuterol (DUONEB) 0.5-2.5 (3) MG/3ML SOLN Take 3 mLs by nebulization every 6 (six) hours as needed (shortness of breath/wheezing.). 12/12/19  Yes Koberlein, Paris Lore, MD  irbesartan (AVAPRO) 300 MG tablet Take 1 tablet by mouth once daily 05/27/21  Yes Koberlein, Junell C, MD  venlafaxine XR (EFFEXOR-XR) 75 MG 24 hr capsule Take 3 capsules (225 mg total) by mouth daily with breakfast. 07/18/21  Yes [name], MD      Allergies    Nsaids, Shellfish allergy, Abilify [aripiprazole], Amlodipine, Amoxicillin, Fish allergy, Latuda [lurasidone hcl], Levaquin [levofloxacin in d5w], Other, Penicillins, Prednisone, Seroquel [quetiapine fumarate], Ativan [lorazepam], Sulfa antibiotics, and Xanax [alprazolam]    Review of Systems   Review of Systems  Constitutional:  Negative for chills and fever.  HENT:  Negative for congestion and sore throat.   Eyes: Negative.   Respiratory:  Negative for chest tightness and shortness of breath.   Cardiovascular:  Negative  for chest pain.  Gastrointestinal:  Positive for abdominal pain. Negative for nausea and vomiting.  Genitourinary: Negative.  Negative for dysuria, frequency, pelvic pain and vaginal discharge.  Musculoskeletal:  Negative for arthralgias, joint swelling and neck pain.  Skin: Negative.  Negative for rash and wound.  Neurological:  Negative for dizziness, weakness, light-headedness, numbness and  headaches.  Psychiatric/Behavioral: Negative.    All other systems reviewed and are negative.   Physical Exam Updated Vital Signs BP (!) 154/99   Pulse 83   Temp 98.6 F (37 C)   Resp 20   Ht 5' 4.5" (1.638 m)   Wt 83.9 kg   SpO2 98%   BMI 31.26 kg/m  Physical Exam Vitals and nursing note reviewed.  Constitutional:      Appearance: She is well-developed.  HENT:     Head: Normocephalic and atraumatic.  Eyes:     Conjunctiva/sclera: Conjunctivae normal.  Cardiovascular:     Rate and Rhythm: Normal rate and regular rhythm.     Heart sounds: Normal heart sounds.  Pulmonary:     Effort: Pulmonary effort is normal.     Breath sounds: Normal breath sounds. No wheezing.  Abdominal:     General: Bowel sounds are normal.     Palpations: Abdomen is soft.     Tenderness: There is abdominal tenderness in the right upper quadrant and right lower quadrant. There is guarding.     Hernia: No hernia is present.  Musculoskeletal:        General: Normal range of motion.     Cervical back: Normal range of motion.  Skin:    General: Skin is warm and dry.  Neurological:     Mental Status: She is alert.     ED Results / Procedures / Treatments   Labs (all labs ordered are listed, but only abnormal results are displayed) Labs Reviewed  COMPREHENSIVE METABOLIC PANEL - Abnormal; Notable for the following components:      Result Value   Potassium 3.1 (*)    Glucose, Bld 131 (*)    Calcium 8.7 (*)    All other components within normal limits  URINALYSIS, ROUTINE W REFLEX MICROSCOPIC - Abnormal; Notable for the following components:   APPearance CLOUDY (*)    Hgb urine dipstick SMALL (*)    Leukocytes,Ua LARGE (*)    Bacteria, UA RARE (*)    Non Squamous Epithelial 0-5 (*)    All other components within normal limits  CBC WITH DIFFERENTIAL/PLATELET  LIPASE, BLOOD    EKG None  Radiology CT Abdomen Pelvis W Contrast  Result Date: 08/31/2021 CLINICAL DATA:  Right lower  quadrant abdominal pain. EXAM: CT ABDOMEN AND PELVIS WITH CONTRAST TECHNIQUE: Multidetector CT imaging of the abdomen and pelvis was performed using the standard protocol following bolus administration of intravenous contrast. RADIATION DOSE REDUCTION: This exam was performed according to the departmental dose-optimization program which includes automated exposure control, adjustment of the mA and/or kV according to patient size and/or use of iterative reconstruction technique. CONTRAST:  159mL OMNIPAQUE IOHEXOL 300 MG/ML  SOLN COMPARISON:  CT examination dated December 19, 2005 FINDINGS: Lower chest: No acute abnormality. Hepatobiliary: No focal liver abnormality is seen. No gallstones, gallbladder wall thickening, or biliary dilatation. Pancreas: Unremarkable. No pancreatic ductal dilatation or surrounding inflammatory changes. Spleen: Normal in size without focal abnormality. Adrenals/Urinary Tract: Adrenal glands are unremarkable. Kidneys are normal, without renal calculi, focal lesion, or hydronephrosis. Bladder is unremarkable. Stomach/Bowel: Stomach is within normal limits. Appendix not  definitely identified, however no inflammatory changes in the pericecal region (series 2, image [id number]). There is a fat density ovoid structure adjacent to the ascending colon with adjacent fat stranding concerning for epiploic appendagitis. No evidence of colitis or diverticulitis. Vascular/Lymphatic: No significant vascular findings are present. No enlarged abdominal or pelvic lymph nodes. Reproductive: Uterus and bilateral adnexa are unremarkable. Other: No abdominal wall hernia or abnormality. No abdominopelvic ascites. Musculoskeletal: No acute or significant osseous findings. IMPRESSION: 1. Small ovoid fat density structure measuring approximately 1.5 cm with adjacent fat stranding adjacent to the ascending colon in the right upper quadrant most consistent with epiploic appendagitis. 2. Appendix not definitely identified,  however no inflammatory changes in the pericecal region. Evidence of colitis or diverticulitis. 3.  No evidence of nephrolithiasis or hydronephrosis. Electronically Signed   By: Larose Hires D.O.   On: 08/31/2021 14:26    Procedures Procedures    Medications Ordered in ED Medications  iohexol (OMNIPAQUE) 300 MG/ML solution 100 mL (100 mLs Intravenous Contrast Given 08/31/21 1354)    ED Course/ Medical Decision Making/ A&P                           Medical Decision Making Patient with a 2-day history of right lower quadrant pain, concerning for possible acute appendicitis, other possibilities include acute diverticulitis, intestinal obstruction, right ovarian torsion, doubt PID or other vaginal source of pain, low risk for STDs, no dysuria, no flank pain, doubt ureteral stone.  She has a negative Murphy's, I doubt this is radicular pain from acute cholecystitis.    Amount and/or Complexity of Data Reviewed Labs: ordered.    Details: Labs are reviewed, pertinent abnormalities included potassium of 3.1, her glucose is 131.  Urinalysis reveals large leukocytes, rare bacteria, however she has 11-20 squamous epithelial cells therefore this is not a clean-catch specimen.  She is nitrite negative, she has no urinary symptoms, symptoms are not secondary to UTI. Radiology: ordered.    Details: CT abdomen pelvis completed, appendix is not definitively identified, but she has no inflammatory changes in this region.  Negative for colitis or diverticulitis.  She does have epiploic appendagitis as source of her symptoms.  Discussed natural course of this condition, treatment, she does not tolerate NSAIDs, therefore she will plan to use Tylenol, she defers any other pain medication.  She is afebrile with a normal WBC count here.  Advised follow-up with general surgery if her symptoms are not resolving over the next 10 days.  She was also given strict return precautions for any development of fever, worse pain,  nausea or vomiting or any new symptoms.  Risk Prescription drug management.           Final Clinical Impression(s) / ED Diagnoses Final diagnoses:  Torsion of appendix epiploicae [facility])    Rx / DC Orders ED Discharge Orders     None         Victoriano Lain 08/31/21 1701    Gerhard Munch, MD 09/01/21 1433

## 2021-08-31 NOTE — ED Triage Notes (Signed)
Pt c/o RLQ pain x 2 days. Pt denies N/V/D. Pt states regular BM. Pt concerned for appendicitis.

## 2021-09-03 ENCOUNTER — Encounter (HOSPITAL_COMMUNITY): Payer: Self-pay

## 2021-09-03 ENCOUNTER — Emergency Department (HOSPITAL_COMMUNITY): Payer: Self-pay

## 2021-09-03 ENCOUNTER — Emergency Department (HOSPITAL_COMMUNITY)
Admission: EM | Admit: 2021-09-03 | Discharge: 2021-09-03 | Disposition: A | Discharge status: Home / Self Care | Payer: Self-pay | Attending: Emergency Medicine | Admitting: Emergency Medicine

## 2021-09-03 DIAGNOSIS — K6389 Other specified diseases of intestine: Secondary | ICD-10-CM

## 2021-09-03 DIAGNOSIS — J45909 Unspecified asthma, uncomplicated: Secondary | ICD-10-CM | POA: Insufficient documentation

## 2021-09-03 DIAGNOSIS — I10 Essential (primary) hypertension: Secondary | ICD-10-CM | POA: Insufficient documentation

## 2021-09-03 DIAGNOSIS — K659 Peritonitis, unspecified: Secondary | ICD-10-CM | POA: Insufficient documentation

## 2021-09-03 DIAGNOSIS — Z79899 Other long term (current) drug therapy: Secondary | ICD-10-CM | POA: Insufficient documentation

## 2021-09-03 DIAGNOSIS — Z7951 Long term (current) use of inhaled steroids: Secondary | ICD-10-CM | POA: Insufficient documentation

## 2021-09-03 LAB — CBC
HCT: 39.6 % (ref 36.0–46.0)
Hemoglobin: 13.3 g/dL (ref 12.0–15.0)
MCH: 27.9 pg (ref 26.0–34.0)
MCHC: 33.6 g/dL (ref 30.0–36.0)
MCV: 83 fL (ref 80.0–100.0)
Platelets: 389 10*3/uL (ref 150–400)
RBC: 4.77 MIL/uL (ref 3.87–5.11)
RDW: 13.2 % (ref 11.5–15.5)
WBC: 7.6 10*3/uL (ref 4.0–10.5)
nRBC: 0 % (ref 0.0–0.2)

## 2021-09-03 LAB — URINALYSIS, ROUTINE W REFLEX MICROSCOPIC
Bilirubin Urine: NEGATIVE
Glucose, UA: NEGATIVE mg/dL
Hgb urine dipstick: NEGATIVE
Ketones, ur: NEGATIVE mg/dL
Nitrite: NEGATIVE
Protein, ur: NEGATIVE mg/dL
Specific Gravity, Urine: 1.013 (ref 1.005–1.030)
pH: 7 (ref 5.0–8.0)

## 2021-09-03 LAB — COMPREHENSIVE METABOLIC PANEL
ALT: 35 U/L (ref 0–44)
AST: 21 U/L (ref 15–41)
Albumin: 4 g/dL (ref 3.5–5.0)
Alkaline Phosphatase: 116 U/L (ref 38–126)
Anion gap: 8 (ref 5–15)
BUN: 9 mg/dL (ref 6–20)
CO2: 26 mmol/L (ref 22–32)
Calcium: 9 mg/dL (ref 8.9–10.3)
Chloride: 103 mmol/L (ref 98–111)
Creatinine, Ser: 0.71 mg/dL (ref 0.44–1.00)
GFR, Estimated: >60 mL/min (ref >60)
Glucose, Bld: 97 mg/dL (ref 70–99)
Potassium: 3.5 mmol/L (ref 3.5–5.1)
Sodium: 137 mmol/L (ref 135–145)
Total Bilirubin: 1 mg/dL (ref 0.3–1.2)
Total Protein: 7.3 g/dL (ref 6.5–8.1)

## 2021-09-03 LAB — POC URINE PREG, ED: Preg Test, Ur: NEGATIVE

## 2021-09-03 LAB — LIPASE, BLOOD: Lipase: 41 U/L (ref 11–51)

## 2021-09-03 MED ORDER — MORPHINE SULFATE (PF) 4 MG/ML IV SOLN
4.0000 mg | Freq: Once | INTRAVENOUS | Status: AC
  Administered 2021-09-03: 4 mg via INTRAVENOUS
  Filled 2021-09-03: qty 1

## 2021-09-03 MED ORDER — OXYCODONE-ACETAMINOPHEN 5-325 MG PO TABS: 1.0000 | ORAL_TABLET | Freq: Four times a day (QID) | ORAL | 0 refills | Status: AC | PRN

## 2021-09-03 MED ORDER — ONDANSETRON HCL 4 MG/2ML IJ SOLN
4.0000 mg | Freq: Once | INTRAMUSCULAR | Status: AC
  Administered 2021-09-03: 4 mg via INTRAVENOUS
  Filled 2021-09-03: qty 2

## 2021-09-03 MED ORDER — IOHEXOL 300 MG/ML  SOLN
100.0000 mL | Freq: Once | INTRAMUSCULAR | Status: AC | PRN
  Administered 2021-09-03: 100 mL via INTRAVENOUS

## 2021-09-03 NOTE — Discharge Instructions (Signed)
You came to the emergency department today to be evaluated for your abdominal pain.  Your lab results were reassuring.  The CT scan obtained showed that you have a epiploic appendagitis.  This is a inflamed area of fat within your abdomen.  This should gradually resolve on its own.  I have given you pain medication to take as needed.  Please follow-up with your primary care doctor if your pain does not improve.  Get help right away if: Your pain does not go away as soon as your health care provider told you to expect. You cannot stop vomiting. Your pain is only in areas of the abdomen, such as the right side or the left lower portion of the abdomen. Pain on the right side could be caused by appendicitis. You have bloody or black stools, or stools that look like tar. You have severe pain, cramping, or bloating in your abdomen. You have signs of dehydration, such as: Dark urine, very little urine, or no urine. Cracked lips. Dry mouth. Sunken eyes. Sleepiness. Weakness. You have trouble breathing or chest pain.

## 2021-09-03 NOTE — ED Triage Notes (Signed)
Pt return after being seen on 8/19. Pt states that her pain has continued, has not gotten better since. Pt has been using ice and tylenol without relief. Could not get referral to answer phone to make appt. Pt states abd pain has move from right side to central abd.

## 2021-09-03 NOTE — ED Provider Notes (Signed)
[facility] EMERGENCY DEPARTMENT Provider Note   CSN: [id number] Arrival date & time: 09/03/21  1207     History  Chief Complaint  Patient presents with   Abdominal Pain    Nancy Barnes is a 51 y.o. female with history of asthma, hypertension, GERD, anxiety, depression, status post tubal ligation.  Presents to the emergency department complaint of abdominal pain.  Patient reports that her abdominal pain started last Thursday and has gotten progressively worse since then.  Pain began to her right mid to lower abdomen and is now radiating to right flank and midline abdomen.  Pain is worse with touch, movement, coughing and sneezing.  Patient states that pain is improved with ice and laying flat.  Patient has been taking Tylenol with minimal relief of symptoms.  Patient endorses nausea earlier today however no emesis.  Patient states that she was able to bruise a small amount of stool earlier this morning.  Patient reports feeling bloated today.  Patient reports that she was seen in the emergency department on 08/31/2021 and diagnosed with epiploic appendagitis.  Patient tried to schedule follow-up appointment with general surgery however was unable to.  Patient denies any NSAID use, illicit drug use, or alcohol use.  Patient denies any history of abdominal surgeries.  Patient reports that she has not had a menstrual period since having an ablation in 2018   Abdominal Pain Associated symptoms: nausea   Associated symptoms: no chest pain, no chills, no constipation, no diarrhea, no dysuria, no fever, no hematuria, no shortness of breath, no vaginal bleeding, no vaginal discharge and no vomiting        Home Medications Prior to Admission medications   Medication Sig Start Date End Date Taking? Authorizing Provider  albuterol (VENTOLIN HFA) 108 (90 Base) MCG/ACT inhaler INHALE 2 PUFFS BY MOUTH EVERY 4 HOURS AS NEEDED FOR WHEEZING AND FOR SHORTNESS OF BREATH Patient taking  differently: Inhale 2 puffs into the lungs every 4 (four) hours as needed for wheezing or shortness of breath. 03/25/21   Jetty Duhamel D, MD  budesonide (PULMICORT) 0.5 MG/2ML nebulizer solution Take 2 mLs (0.5 mg total) by nebulization daily. Patient taking differently: Take 0.5 mg by nebulization daily as needed (shortness of breath (winter months)). 12/12/19   Wynn Banker, MD  cetirizine (ZYRTEC) 10 MG chewable tablet Chew 10 mg by mouth daily.    [provider]  diltiazem (CARDIZEM CD) 120 MG 24 hr capsule Take 1 capsule (120 mg total) by mouth daily. 05/13/21   Koberlein, Jannette Spanner C, MD  EPINEPHrine 0.3 mg/0.3 mL IJ SOAJ injection Inject 0.3 mg into the muscle as needed for anaphylaxis. 03/25/21   Waymon Budge, MD  fluticasone-salmeterol [facility] INHUB) 250-50 MCG/ACT AEPB Inhale 1 puff then rinse mouth, twice daily 03/25/21   Jetty Duhamel D, MD  hydrOXYzine (ATARAX) 10 MG tablet Take 1 tablet (10 mg total) by mouth daily as needed for anxiety. 05/02/21   [name], MD  ipratropium (ATROVENT) 0.03 % nasal spray Place 2 sprays into the nose 2 (two) times daily. 2 sprays each nostril twice daily 12/12/19   Koberlein, Jannette Spanner C, MD  ipratropium-albuterol (DUONEB) 0.5-2.5 (3) MG/3ML SOLN Take 3 mLs by nebulization every 6 (six) hours as needed (shortness of breath/wheezing.). 12/12/19   Wynn Banker, MD  irbesartan (AVAPRO) 300 MG tablet Take 1 tablet by mouth once daily 05/27/21   Koberlein, Paris Lore, MD  venlafaxine XR (EFFEXOR-XR) 75 MG 24 hr capsule Take 3 capsules (  225 mg total) by mouth daily with breakfast. 07/18/21   [name], MD      Allergies    Nsaids, Shellfish allergy, Abilify [aripiprazole], Amlodipine, Amoxicillin, Fish allergy, Latuda [lurasidone hcl], Levaquin [levofloxacin in d5w], Other, Penicillins, Prednisone, Seroquel [quetiapine fumarate], Ativan [lorazepam], Sulfa antibiotics, and Xanax [alprazolam]    Review of Systems   Review of Systems   Constitutional:  Negative for chills and fever.  Eyes:  Negative for visual disturbance.  Respiratory:  Negative for shortness of breath.   Cardiovascular:  Negative for chest pain.  Gastrointestinal:  Positive for abdominal pain and nausea. Negative for abdominal distention, anal bleeding, blood in stool, constipation, diarrhea, rectal pain and vomiting.  Genitourinary:  Positive for flank pain. Negative for decreased urine volume, difficulty urinating, dysuria, frequency, genital sores, hematuria, urgency, vaginal bleeding, vaginal discharge and vaginal pain.  Musculoskeletal:  Negative for back pain and neck pain.  Skin:  Negative for color change and rash.  Neurological:  Negative for dizziness, syncope, light-headedness and headaches.  Psychiatric/Behavioral:  Negative for confusion.     Physical Exam Updated Vital Signs BP (!) 172/113 (BP Location: Right Arm)   Pulse 85   Temp 98.5 F (36.9 C) (Oral)   Resp 18   Ht 5' 4.5" (1.638 m)   Wt 83.8 kg   SpO2 100%   BMI 31.23 kg/m  Physical Exam Vitals and nursing note reviewed.  Constitutional:      General: She is not in acute distress.    Appearance: She is not ill-appearing, toxic-appearing or diaphoretic.  HENT:     Head: Normocephalic.  Eyes:     General: No scleral icterus.       Right eye: No discharge.        Left eye: No discharge.  Cardiovascular:     Rate and Rhythm: Normal rate.  Pulmonary:     Effort: Pulmonary effort is normal.  Abdominal:     General: Abdomen is flat. Bowel sounds are normal. There is no distension. There are no signs of injury.     Palpations: Abdomen is soft. There is no mass or pulsatile mass.     Tenderness: There is abdominal tenderness in the right upper quadrant and right lower quadrant. There is no right CVA tenderness, left CVA tenderness, guarding or rebound.     Hernia: There is no hernia in the umbilical area or ventral area.  Skin:    General: Skin is warm and dry.   Neurological:     General: No focal deficit present.     Mental Status: She is alert.  Psychiatric:        Behavior: Behavior is cooperative.     ED Results / Procedures / Treatments   Labs (all labs ordered are listed, but only abnormal results are displayed) Labs Reviewed  URINALYSIS, ROUTINE W REFLEX MICROSCOPIC - Abnormal; Notable for the following components:      Result Value   Leukocytes,Ua MODERATE (*)    Bacteria, UA RARE (*)    All other components within normal limits  LIPASE, BLOOD  COMPREHENSIVE METABOLIC PANEL  CBC  POC URINE PREG, ED    EKG None  Radiology CT ABDOMEN PELVIS W CONTRAST  Result Date: 09/03/2021 CLINICAL DATA:  Right lower quadrant abdominal pain. EXAM: CT ABDOMEN AND PELVIS WITH CONTRAST TECHNIQUE: Multidetector CT imaging of the abdomen and pelvis was performed using the standard protocol following bolus administration of intravenous contrast. RADIATION DOSE REDUCTION: This exam was performed according to  the departmental dose-optimization program which includes automated exposure control, adjustment of the mA and/or kV according to patient size and/or use of iterative reconstruction technique. CONTRAST:  OMNIPAQUE IOHEXOL 300 MG/ML  SOLN COMPARISON:  CT abdomen pelvis dated August 31, 2021. FINDINGS: Lower chest: No acute abnormality. Hepatobiliary: No suspicious hepatic lesion. Gallbladder is unremarkable. No biliary ductal dilation. Pancreas: No pancreatic ductal dilation or evidence of acute inflammation. Spleen: No splenomegaly. Adrenals/Urinary Tract: Bilateral adrenal glands appear normal. No hydronephrosis. Fluid density 4 cm left renal lesion is consistent with a cyst and considered benign requiring no independent imaging follow-up. Kidneys demonstrate symmetric enhancement and excretion of contrast material. Urinary bladder is unremarkable for degree of distension. Stomach/Bowel: No radiopaque enteric contrast material was administered.  Tiny hiatal hernia otherwise the stomach is unremarkable for degree of distension. No pathologic dilation of small or large bowel. Appendix is not confidently identified however there is no periappendiceal inflammation. Vascular/Lymphatic: Normal caliber abdominal aorta. No pathologically enlarged abdominal or pelvic lymph nodes. Reproductive: Uterus and bilateral adnexa are unremarkable. Other: Similar appearance of the central fat density focus of inflammation anterior to the ascending colon on image 49/2 without colonic wall thickening consistent with epiploic appendagitis. Musculoskeletal: No acute or significant osseous findings. IMPRESSION: 1. Similar appearance of the central fat density focus of inflammation anterior to the ascending colon without colonic inflammation consistent with epiploic appendagitis, which is a benign self-limiting condition which can be a cause of abdominal pain but requires no surgical intervention or imaging follow-up. 2. Appendix not confidently identified however no pericecal inflammatory changes are present to suggest acute appendicitis. 3. No evidence of colitis or diverticulitis. Electronically Signed   By: Maudry Mayhew M.D.   On: 09/03/2021 14:41    Procedures Procedures    Medications Ordered in ED Medications  morphine (PF) 4 MG/ML injection 4 mg (4 mg Intravenous Given 09/03/21 1358)  ondansetron (ZOFRAN) injection 4 mg (4 mg Intravenous Given 09/03/21 1357)  iohexol (OMNIPAQUE) 300 MG/ML solution 100 mL (100 mLs Intravenous Contrast Given 09/03/21 1415)    ED Course/ Medical Decision Making/ A&P Clinical Course as of 09/03/21 1513  Tue Sep 03, 2021  1507 I spoke to general surgeon Dr. Robyne Peers who advised that there is no surgical intervention needed at this time.  Recommended following up with PCP if pain is not improved. [PB]    Clinical Course User Index [PB] Haskel Schroeder, PA-C                           Medical Decision Making Amount  and/or Complexity of Data Reviewed Labs: ordered. Radiology: ordered.  Risk Prescription drug management.   Alert 51 year old female in no acute distress, nontoxic-appearing.  Presents to the emergency department complaining of abdominal pain  Information was obtained from patient and patient's mother at bedside.  I reviewed patient's past medical records including previous prior notes, labs, and imaging.  Patient has medical history as outlined in HPI which complicates her care.  Per chart review patient was seen on 08/31/2021 and [facility] emergency department for similar complaints.  Patient had CT imaging at that time which showed epiploic appendagitis to ascending colon.  Labs were reassuring.  Patient advised to take pain medication as needed and follow-up with general surgery if pain did not improve.  With worsening of abdominal pain and pain to right lower extremity cannot rule out acute appendicitis at this time.  Will obtain repeat labs and  CT imaging.  I personally viewed and interpreted patient's lab results.  Pertinent findings include: -CMP, CBC, and lipase unremarkable -UA shows no signs of infection  I personally viewed and interpreted patient CT imaging.  Agree with radiology interpretation of similar appearance of the central fat density focus of inflammation anterior to the ascending colon without colonic inflammation consistent with epiploic appendagitis.  Appendix not confidently identified however no pericecal inflammatory changes are present 6 suggest acute appendicitis.  I spoke with general surgeon Dr. Robyne Peers who advised there is no surgical intervention is needed at this time.  On repeat examination abdomen soft, nondistended, with improvement in tenderness.  Patient does report improvement pain after receiving morphine.  Will prescribe patient with course of Percocet.  Patient to follow-up with PCP as needed if pain does not improve.  Based on patient's chief  complaint, I considered admission might be necessary, however after reassuring ED workup feel patient is reasonable for discharge.  Discussed results, findings, treatment and follow up. Patient advised of return precautions. Patient verbalized understanding and agreed with plan.  Portions of this note were generated with Scientist, clinical (histocompatibility and immunogenetics). Dictation errors may occur despite best attempts at proofreading.         Final Clinical Impression(s) / ED Diagnoses Final diagnoses:  Epiploic appendagitis    Rx / DC Orders ED Discharge Orders          Ordered    oxyCODONE-acetaminophen (PERCOCET/ROXICET) 5-325 MG tablet  Every 6 hours PRN        09/03/21 1518              Berneice Heinrich 09/03/21 1525    Rondel Baton, MD 09/04/21 [phone]

## 2021-09-13 ENCOUNTER — Ambulatory Visit (INDEPENDENT_AMBULATORY_CARE_PROVIDER_SITE_OTHER): Payer: Self-pay | Admitting: Family Medicine

## 2021-09-13 ENCOUNTER — Encounter: Payer: Self-pay | Admitting: Family Medicine

## 2021-09-13 VITALS — BP 130/60 | HR 75 | Temp 98.5°F | Ht 64.5 in | Wt 186.3 lb

## 2021-09-13 DIAGNOSIS — I10 Essential (primary) hypertension: Secondary | ICD-10-CM

## 2021-09-13 DIAGNOSIS — F332 Major depressive disorder, recurrent severe without psychotic features: Secondary | ICD-10-CM

## 2021-09-13 DIAGNOSIS — Z833 Family history of diabetes mellitus: Secondary | ICD-10-CM

## 2021-09-13 DIAGNOSIS — Z1211 Encounter for screening for malignant neoplasm of colon: Secondary | ICD-10-CM

## 2021-09-13 LAB — POCT GLYCOSYLATED HEMOGLOBIN (HGB A1C): Hemoglobin A1C: 5.6 % (ref 4.0–5.6)

## 2021-09-13 MED ORDER — IRBESARTAN 300 MG PO TABS: 300.0000 mg | ORAL_TABLET | Freq: Every day | ORAL | 5 refills | Status: DC

## 2021-09-13 NOTE — Progress Notes (Signed)
   Established Patient Office Visit  Subjective   Patient ID: Nancy Barnes, female    DOB: 04-16-1970  Age: 51 y.o. MRN: [id number]  Chief Complaint  Patient presents with  . Establish Care    Patient is here for transition of care visit.  HTN -- Pt reports she just recently had apendagitis, states she went to the ER last month with severe RLQ pain. BP was elevated at that time. States she was told to rest and take pain medication until it improved. She reports the pain is much better, nearly resolved. Today's BP is 130/60. Pt states she was checking her BP at home and it was in the 150's.    {History (Optional):23778}  ROS    Objective:     BP 130/60 (BP Location: Left Arm, Patient Position: Sitting, Cuff Size: Normal)   Pulse 75   Temp 98.5 F (36.9 C) (Oral)   Ht 5' 4.5" (1.638 m)   Wt 186 lb 4.8 oz (84.5 kg)   SpO2 98%   BMI 31.48 kg/m  {Vitals History (Optional):23777}  Physical Exam   No results found for any visits on 09/13/21.  {Labs (Optional):23779}  The 10-year ASCVD risk score (Arnett DK, et al., 2019) is: 2.1%    Assessment & Plan:   Problem List Items Addressed This Visit       Cardiovascular and Mediastinum   Hypertension - Primary   Relevant Medications   irbesartan (AVAPRO) 300 MG tablet     Other   Depression   Other Visit Diagnoses     Colon cancer screening       Relevant Orders   Cologuard   Family history of diabetes mellitus       Relevant Orders   POC HgB A1c       Return in about 6 months (around 03/14/2022) for Well Woman exam (pap and pelvic).    Karie Georges, MD

## 2021-09-18 NOTE — Assessment & Plan Note (Signed)
Stable sx on effexor 225 mg daily, continue this medication.  Flowsheet Row Office Visit from 09/13/2021 in [facility] [place]  PHQ-9 Total Score 8

## 2021-09-18 NOTE — Assessment & Plan Note (Addendum)
Current hypertension medications:      Sig   diltiazem (CARDIZEM CD) 120 MG 24 hr capsule (Taking) Take 1 capsule (120 mg total) by mouth daily.   irbesartan (AVAPRO) 300 MG tablet Take 1 tablet (300 mg total) by mouth daily.    BP is improved today, she was started on the diltiazem at the last visit on 05/13/21.  I advised that she continue to monitor her BP at home and if it remains elevated then she should schedule another visit with me to increase her medication. RTC in 6 months.

## 2021-09-26 ENCOUNTER — Encounter (HOSPITAL_COMMUNITY): Payer: Self-pay | Admitting: Psychiatry

## 2021-09-26 ENCOUNTER — Telehealth (HOSPITAL_BASED_OUTPATIENT_CLINIC_OR_DEPARTMENT_OTHER): Payer: PRIVATE HEALTH INSURANCE | Admitting: Psychiatry

## 2021-09-26 DIAGNOSIS — F902 Attention-deficit hyperactivity disorder, combined type: Secondary | ICD-10-CM

## 2021-09-26 DIAGNOSIS — F411 Generalized anxiety disorder: Secondary | ICD-10-CM

## 2021-09-26 DIAGNOSIS — F99 Mental disorder, not otherwise specified: Secondary | ICD-10-CM

## 2021-09-26 DIAGNOSIS — F5105 Insomnia due to other mental disorder: Secondary | ICD-10-CM

## 2021-09-26 DIAGNOSIS — F332 Major depressive disorder, recurrent severe without psychotic features: Secondary | ICD-10-CM

## 2021-09-26 MED ORDER — VENLAFAXINE HCL ER 75 MG PO CP24: 225.0000 mg | ORAL_CAPSULE | Freq: Every day | ORAL | 0 refills | Status: DC

## 2021-09-26 NOTE — Progress Notes (Signed)
Virtual Visit via Video Note  I connected with Nancy Barnes on 09/26/21 at  3:30 PM EDT by  a video enabled telemedicine application and verified that I am speaking with the correct person using two identifiers.  Location: Patient: work Provider: office   I discussed the limitations of evaluation and management by telemedicine and the availability of in person appointments. The patient expressed understanding and agreed to proceed.  History of Present Illness: Nancy Barnes share she has been very busy with work and school. Her daughter's wedding is at the end of October. They have been very busy planning. Her anxiety has been stable. It is situational but she feels her response is appropriate to the situation and she has not needed Vistaril. She has been so busy she has not had time to think about being depressed. She can't recall the last time she was depressed. She denies anhedonia. She uses her CPAP most nights and sleeps well. Her energy is generally on the low side. She denies SI/HI. Her focus is poor. She uses lists to get things done. She is always self critical.    Observations/Objective: Psychiatric Specialty Exam: ROS  There were no vitals taken for this visit.There is no height or weight on file to calculate BMI.  General Appearance: Fairly Groomed and Neat  Eye Contact:  Good  Speech:  Clear and Coherent and Normal Rate  Volume:  Normal  Mood:  Euthymic  Affect:  Full Range  Thought Process:  Goal Directed, Linear, and Descriptions of Associations: Intact  Orientation:  Full (Time, Place, and Person)  Thought Content:  Logical  Suicidal Thoughts:  No  Homicidal Thoughts:  No  Memory:  Immediate;   Good  Judgement:  Good  Insight:  Good  Psychomotor Activity:  Normal  Concentration:  Concentration: Good  Recall:  Good  Fund of Knowledge:  Good  Language:  Good  Akathisia:  No  Handed:  Right  AIMS (if indicated):     Assets:  Communication Skills Desire for  Improvement Financial Resources/Insurance Housing Leisure Time Resilience Social Support Talents/Skills Transportation Vocational/Educational  ADL's:  Intact  Cognition:  WNL  Sleep:        Assessment and Plan:     09/26/2021    3:50 PM 09/13/2021    3:04 PM 07/18/2021    3:19 PM 05/03/2021    2:22 PM 01/10/2021   10:15 AM  Depression screen PHQ 2/9  Decreased Interest 0 0 0 1 0  Down, Depressed, Hopeless 0 0 1 1 1   PHQ - 2 Score 0 0 1 2 1   Altered sleeping 0 2  1   Tired, decreased energy 3 3  3    Change in appetite 0 1  0   Feeling bad or failure about yourself  3 2  1    Trouble concentrating 3 0  0   Moving slowly or fidgety/restless 0 0  0   Suicidal thoughts 0 0  0   PHQ-9 Score 9 8  7    Difficult doing work/chores  Somewhat difficult  Somewhat difficult     Flowsheet Row Video Visit from 09/26/2021 in [facility] ED from 09/03/2021 in [facility] ED from 08/31/2021 in [facility] EMERGENCY DEPARTMENT  C-SSRS RISK CATEGORY No Risk No Risk No Risk        Pt is aware that these meds carry a teratogenic risk. Pt will discuss plan of action if she does or plans to become  pregnant in the future.  Status of current problems: stable  Meds: no refill on Vistaril - she has enough 1. GAD (generalized anxiety disorder) - venlafaxine XR (EFFEXOR-XR) 75 MG 24 hr capsule; Take 3 capsules (225 mg total) by mouth daily with breakfast.  Dispense: 270 capsule; Refill: 0  2. Severe episode of recurrent major depressive disorder, without psychotic features (HCC) - venlafaxine XR (EFFEXOR-XR) 75 MG 24 hr capsule; Take 3 capsules (225 mg total) by mouth daily with breakfast.  Dispense: 270 capsule; Refill: 0  3. Insomnia due to other mental disorder  4. Attention deficit hyperactivity disorder (ADHD), combined type     Labs: none    Therapy: brief supportive therapy provided. Discussed psychosocial stressors in detail.     Collaboration of Care: Other none  Patient/Guardian was advised Release of Information must be obtained prior to any record release in order to collaborate their care with an outside provider. Patient/Guardian was advised if they have not already done so to contact the registration department to sign all necessary forms in order for [place] to release information regarding their care.   Consent: Patient/Guardian gives verbal consent for treatment and assignment of benefits for services provided during this visit. Patient/Guardian expressed understanding and agreed to proceed.    Follow Up Instructions: Follow up in 3 months or sooner if needed    I discussed the assessment and treatment plan with the patient. The patient was provided an opportunity to ask questions and all were answered. The patient agreed with the plan and demonstrated an understanding of the instructions.   The patient was advised to call back or seek an in-person evaluation if the symptoms worsen or if the condition fails to improve as anticipated.  I provided 12 minutes of non-face-to-face time during this encounter.   [name], MD

## 2021-09-27 ENCOUNTER — Encounter: Payer: Self-pay | Admitting: Family Medicine

## 2021-09-27 ENCOUNTER — Telehealth (INDEPENDENT_AMBULATORY_CARE_PROVIDER_SITE_OTHER): Payer: Self-pay | Admitting: Family Medicine

## 2021-09-27 VITALS — Ht 64.5 in

## 2021-09-27 DIAGNOSIS — R350 Frequency of micturition: Secondary | ICD-10-CM

## 2021-09-27 DIAGNOSIS — N390 Urinary tract infection, site not specified: Secondary | ICD-10-CM

## 2021-09-27 MED ORDER — FOSFOMYCIN TROMETHAMINE 3 G PO PACK: 3.0000 g | PACK | Freq: Once | ORAL | 0 refills | Status: AC

## 2021-09-27 NOTE — Telephone Encounter (Signed)
I called the patient and scheduled a virtual visit with Dr [place] today at 4:30pm.

## 2021-09-27 NOTE — Progress Notes (Signed)
Virtual Visit via Video Note I connected with Nancy Barnes on 09/27/21 by a video enabled telemedicine application and verified that I am speaking with the correct person using two identifiers.  Location patient: home Location provider:work office Persons participating in the virtual visit: patient, provider Caregibility failed, could not get video, just sound.  I discussed the limitations of evaluation and management by telemedicine and the availability of in person appointments. The patient expressed understanding and agreed to proceed.  Chief Complaint  Patient presents with   Cystitis    Patient complains of "bladder pain and sensation of bladder being full' x1 day, denies pain with urination and states she performed a dipstick at home   HPI: Nancy Barnes is a 51 yo female with hx of OSA, depression, GAD, and asthma complaining of urinary frequency that started yesterday as described above. She performed a home Azo strip testing, which was positive for leukocytes and negative for nitrite. She has also have suprapubic pressure like pain, not radiated. She has not noted changes in urine smell, color, dysuria, or gross hematuria.  Urinary Frequency  This is a new problem. The current episode started yesterday. The problem has been unchanged. The pain is at a severity of 6/10. The pain is moderate. There has been no fever. She is Not sexually active. There is No history of pyelonephritis. Associated symptoms include frequency and urgency. Pertinent negatives include no chills, discharge, flank pain, hematuria, hesitancy, nausea, possible pregnancy, sweats or vomiting. There is no history of recurrent UTIs or a urological procedure.  Negative for changes in bowel habits, back pain, vomiting, vaginal bleeding, or skin rash. She has taking cranberry juice and Tylenol for pain. LMP in 2008.  ROS: See pertinent positives and negatives per HPI.  Past Medical History:  Diagnosis Date    Anxiety    Asthma    Chicken pox    Complication of anesthesia    unable to tolerate NSAIDS   Depression    Environmental allergies    food/medication   Family history of adverse reaction to anesthesia    mother skin glue allergy post surgery    GERD (gastroesophageal reflux disease)    Headache(784.0)    Hypertension    Hypokalemia    Pneumomediastinum (HCC)    Seasonal allergies    Sleep apnea    Suicidal ideations    UTI (urinary tract infection)    Past Surgical History:  Procedure Laterality Date   ABLATION     BREAST BIOPSY  04/2011   BREAST REDUCTION SURGERY Bilateral 09/14/2019   Procedure: BILATERAL BREAST REDUCTION WITH LIPOSUCTION;  Surgeon: Wallace Going, DO;  Location: [place];  Service: Plastics;  Laterality: Bilateral;  3 hours   TUBAL LIGATION     Family History  Problem Relation Age of Onset   Heart attack Mother 29       Bypass x5   Allergies Mother    CAD Mother    High blood pressure Mother    High Cholesterol Mother    Heart disease Mother    Arthritis Mother    Heart failure Mother    Diabetes Mellitus II Father    Hypertension Father    Heart disease Father    CAD Father    Other Father        Agent orange exposure   Arthritis Father    Depression Father    Atrial fibrillation Father    Allergies Maternal Grandmother    Atrial  fibrillation Maternal Grandmother    Stroke Maternal Grandmother    Asthma Maternal Grandfather    Asthma Paternal Grandmother    Asthma Paternal Grandfather    Allergies Daughter    Psoriasis Daughter    Arthritis Daughter        psoriatic   Hypothyroidism Daughter    Suicidality Neg Hx    Anxiety disorder Neg Hx     Social History   Socioeconomic History   Marital status: Divorced    Spouse name: Not on file   Number of children: Not on file   Years of education: Not on file   Highest education level: Associate degree: occupational, Scientist, product/process development, or vocational program   Occupational History   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 Lives at home with 2 teenage children. Never smoker.   Social Determinants of Health   Financial Resource Strain: Medium Risk (05/03/2021)   Overall Financial Resource Strain (CARDIA)    Difficulty of Paying Living Expenses: Somewhat hard  Food Insecurity: Unknown (05/03/2021)   Hunger Vital Sign    Worried About Running Out of Food in the Last Year: Patient refused    Ran Out of Food in the Last Year: Patient refused  Transportation Needs: No Transportation Needs (05/03/2021)   PRAPARE - Administrator, Civil Service (Medical): No    Lack of Transportation (Non-Medical): No  Physical Activity: Unknown (05/03/2021)   Exercise Vital Sign    Days of Exercise per Week: 0 days    Minutes of Exercise per Session: Not on file  Stress: Stress Concern Present (05/03/2021)   [employer] of Occupational Health - Occupational Stress Questionnaire    Feeling of Stress : Rather much  Social Connections: Moderately Integrated (05/03/2021)   Social Connection and Isolation Panel [NHANES]    Frequency of Communication with Friends and Family: More than three times a week    Frequency of Social Gatherings with Friends and Family: More than three times a week    Attends Religious Services: More than 4 times per year    Active Member of [employer] or Organizations: Yes    Attends Engineer, structural: More than 4 times per year     Marital Status: Divorced  Catering manager Violence: Not on file   Current Outpatient Medications:    albuterol (VENTOLIN HFA) 108 (90 Base) MCG/ACT inhaler, INHALE 2 PUFFS BY MOUTH EVERY 4 HOURS AS NEEDED FOR WHEEZING AND FOR SHORTNESS OF BREATH (Patient taking differently: Inhale 2 puffs into the lungs every 4 (four) hours as needed for wheezing or shortness of breath.), Disp: 18 g, Rfl: 12   budesonide (PULMICORT) 0.5 MG/2ML nebulizer solution, Take 2 mLs (0.5 mg total) by nebulization daily. (Patient taking differently: Take 0.5 mg by nebulization daily as needed (shortness of breath (winter months)).), Disp: 60 mL, Rfl: 5   cetirizine (ZYRTEC) 10 MG chewable tablet, Chew 10 mg by mouth daily., Disp: ,  Rfl:    diltiazem (CARDIZEM CD) 120 MG 24 hr capsule, Take 1 capsule (120 mg total) by mouth daily., Disp: 30 capsule, Rfl: 5   EPINEPHrine 0.3 mg/0.3 mL IJ SOAJ injection, Inject 0.3 mg into the muscle as needed for anaphylaxis., Disp: 1 each, Rfl: 5   fluticasone-salmeterol (WIXELA INHUB) 250-50 MCG/ACT AEPB, Inhale 1 puff then rinse mouth, twice daily, Disp: 60 each, Rfl: 12   fosfomycin (MONUROL) 3 g PACK, Take 3 g by mouth once for 1 dose., Disp: 3 g, Rfl: 0   hydrOXYzine (ATARAX) 10 MG tablet, Take 1 tablet (10 mg total) by mouth daily as needed for anxiety., Disp: 90 tablet, Rfl: 0   ipratropium (ATROVENT) 0.03 % nasal spray, Place 2 sprays into the nose 2 (two) times daily. 2 sprays each nostril twice daily, Disp: 30 mL, Rfl: 5   ipratropium-albuterol (DUONEB) 0.5-2.5 (3) MG/3ML SOLN, Take 3 mLs by nebulization every 6 (six) hours as needed (shortness of breath/wheezing.)., Disp: 360 mL, Rfl: 5   irbesartan (AVAPRO) 300 MG tablet, Take 1 tablet (300 mg total) by mouth daily., Disp: 30 tablet, Rfl: 5   venlafaxine XR (EFFEXOR-XR) 75 MG 24 hr capsule, Take 3 capsules (225 mg total) by mouth daily with breakfast., Disp: 270 capsule, Rfl: 0  EXAM:  VITALS per patient if applicable:Ht 5' 4.5"  (1.638 m)   BMI 31.48 kg/m   GENERAL: alert, oriented, appears well and in no acute distress LUNGS:Breathing rate appears normal, no obvious gross SOB, gasping or wheezing PSYCH/NEURO: pleasant and cooperative, no obvious depression or anxiety, speech and thought processing grossly intact  ASSESSMENT AND PLAN:  Discussed the following assessment and plan:  Urine frequency  UTI (urinary tract infection), uncomplicated - Plan: fosfomycin (MONUROL) 3 g PACK We discussed differential diagnosis. She has multiple medication allergies. She agrees with taking fosfomycin 3 g x 1. Monitor for new symptoms. Keep adequate hydration and continue cranberry juice. If symptoms are persistent, she will need to bring urine sample to be sent for urine culture. Clearly instructed about warning signs.  We discussed possible serious and likely etiologies, options for evaluation and workup, limitations of telemedicine visit vs in person visit, treatment, treatment risks and precautions. The patient was advised to call back or seek an in-person evaluation if the symptoms worsen or if the condition fails to improve as anticipated. I discussed the assessment and treatment plan with the patient. The patient was provided an opportunity to ask questions and all were answered. The patient agreed with the plan and demonstrated an understanding of the instructions.  Non face to face time of visit 10 min. Return if symptoms worsen or fail to improve.  Carrissa Taitano G. [place], MD  [facility]. [place] office.

## 2021-10-08 LAB — COLOGUARD: COLOGUARD: NEGATIVE

## 2021-10-09 NOTE — Progress Notes (Signed)
Negative cologard

## 2021-12-19 ENCOUNTER — Telehealth (HOSPITAL_BASED_OUTPATIENT_CLINIC_OR_DEPARTMENT_OTHER): Payer: Self-pay | Admitting: Psychiatry

## 2021-12-19 DIAGNOSIS — F411 Generalized anxiety disorder: Secondary | ICD-10-CM

## 2021-12-19 DIAGNOSIS — F332 Major depressive disorder, recurrent severe without psychotic features: Secondary | ICD-10-CM

## 2021-12-19 DIAGNOSIS — F5105 Insomnia due to other mental disorder: Secondary | ICD-10-CM

## 2021-12-19 MED ORDER — VENLAFAXINE HCL ER 75 MG PO CP24: 225.0000 mg | ORAL_CAPSULE | Freq: Every day | ORAL | 0 refills | Status: DC

## 2021-12-19 MED ORDER — MIRTAZAPINE 15 MG PO TABS: 15.0000 mg | ORAL_TABLET | Freq: Every day | ORAL | 0 refills | Status: DC

## 2021-12-19 NOTE — Progress Notes (Signed)
Virtual Visit via Video Note  I connected with Nancy Barnes on 12/19/21 at  1:00 PM EST by a video enabled telemedicine application and verified that I am speaking with the correct person using two identifiers.  Location: Patient: home Provider: office   I discussed the limitations of evaluation and management by telemedicine and the availability of in person appointments. The patient expressed understanding and agreed to proceed.  History of Present Illness: Nancy Barnes shares things are "alright". She admits she needs to learn to kept things herself. Nancy Barnes has ongoing depression with crying spells. The depression is mostly situational but has been going on for almost 3 months. It is not constant but she does feel down several days/week. She denies isolation and anhedonia.  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  Nancy Barnes failed a part of her insurance exam. 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 Nancy Barnes doesn't have many job options in her home town so she is trying to make best of it. Finances are a big stressor. Nancy Barnes stresses about a lot of things and admits she is dealing with a lot of anxiety. She has ruminating thoughts and is endorsing poor energy and motivation. At times she feels overwhelmed and stops cares about things. Her focus is poor. She denies panic attacks. Her sleep schedule is off and it takes her hours to fall asleep. [name] is getting 5-6 hrs/night. She denies SI/HI. Nancy Barnes doesn't want to take more meds and feels she is on high enough doses. She has not been taking Vistaril.   Observations/Objective: Psychiatric Specialty Exam: ROS  There were no vitals taken for this visit.There is no height or weight on file to calculate BMI.  General Appearance: Casual and Neat  Eye Contact:  Good  Speech:  Clear and Coherent and Normal Rate  Volume:  Normal  Mood:  Depressed  Affect:  Congruent and Tearful  Thought Process:  Goal Directed, Linear, and  Descriptions of Associations: Intact  Orientation:  Full (Time, Place, and Person)  Thought Content:  Logical  Suicidal Thoughts:  No  Homicidal Thoughts:  No  Memory:  Immediate;   Good  Judgement:  Good  Insight:  Good  Psychomotor Activity:  Normal  Concentration:  Concentration: Good  Recall:  Good  Fund of Knowledge:  Good  Language:  Good  Akathisia:  No  Handed:  Right  AIMS (if indicated):     Assets:  Communication Skills Desire for Improvement Financial Resources/Insurance Housing Leisure Time Resilience Social Support Talents/Skills Transportation Vocational/Educational  ADL's:  Intact  Cognition:  WNL  Sleep:        Assessment and Plan:     12/19/2021    1:17 PM 09/26/2021    3:50 PM 09/13/2021    3:04 PM 07/18/2021    3:19 PM 05/03/2021    2:22 PM  Depression screen PHQ 2/9  Decreased Interest 0 0 0 0 1  Down, Depressed, Hopeless 2 0 0 1 1  PHQ - 2 Score 2 0 0 1 2  Altered sleeping 3 0 2  1  Tired, decreased energy 3 3 3  3   Change in appetite 3 0 1  0  Feeling bad or failure about yourself  3 3 2  1   Trouble concentrating 3 3 0  0  Moving slowly or fidgety/restless 0 0 0  0  Suicidal thoughts 0 0 0  0  PHQ-9 Score 17 9 8  7  Difficult doing work/chores Very difficult  Somewhat difficult  Somewhat difficult    Flowsheet Row Video Visit from 09/26/2021 in [facility] from 09/03/2021 in [facility] EM[facility]GENCY DEPARTMENT ED from 08/31/2021 in [facility] EMERGENCY DEPARTMENT  C-SSRS RISK CATEGORY No Risk No Risk No Risk        Pt is aware that these meds carry a teratogenic risk. Pt will discuss plan of action if she does or plans to become pregnant in the future.  Status of current problems: ongoing depression and anxiety and insomnia  Meds: start trial of Remeron 15mg  po qHS for sleep, anxiety and depression 1. GAD (generalized anxiety disorder) - venlafaxine XR (EFFEXOR-XR) 75 MG 24 hr capsule; Take 3  capsules (225 mg total) by mouth daily with breakfast.  Dispense: 270 capsule; Refill: 0 - mirtazapine (REMERON) 15 MG tablet; Take 1 tablet (15 mg total) by mouth at bedtime.  Dispense: 15 tablet; Refill: 0  2. Severe episode of recurrent major depressive disorder, without psychotic features (HCC) - venlafaxine XR (EFFEXOR-XR) 75 MG 24 hr capsule; Take 3 capsules (225 mg total) by mouth daily with breakfast.  Dispense: 270 capsule; Refill: 0 - mirtazapine (REMERON) 15 MG tablet; Take 1 tablet (15 mg total) by mouth at bedtime.  Dispense: 15 tablet; Refill: 0  3. Insomnia due to other mental disorder - mirtazapine (REMERON) 15 MG tablet; Take 1 tablet (15 mg total) by mouth at bedtime.  Dispense: 15 tablet; Refill: 0     Labs: none    Therapy: brief supportive therapy provided. Discussed psychosocial stressors in detail.    Collaboration of Care: Other none  Patient/Guardian was advised Release of Information must be obtained prior to any record release in order to collaborate their care with an outside provider. Patient/Guardian was advised if they have not already done so to contact the registration department to sign all necessary forms in order for to release information regarding their care.   Consent: Patient/Guardian gives verbal consent for treatment and assignment of benefits for services provided during this visit. Patient/Guardian expressed understanding and agreed to proceed.     Follow Up Instructions: Follow up in 2 weeks or sooner if needed    I discussed the assessment and treatment plan with the patient. The patient was provided an opportunity to ask questions and all were answered. The patient agreed with the plan and demonstrated an understanding of the instructions.   The patient was advised to call back or seek an in-person evaluation if the symptoms worsen or if the condition fails to improve as anticipated.  I provided 17 minutes of non-face-to-face time  during this encounter.   [place], MD

## 2021-12-30 ENCOUNTER — Other Ambulatory Visit: Payer: Self-pay

## 2021-12-30 ENCOUNTER — Encounter (HOSPITAL_COMMUNITY): Payer: Self-pay | Admitting: Emergency Medicine

## 2021-12-30 ENCOUNTER — Emergency Department (HOSPITAL_COMMUNITY)
Admission: EM | Admit: 2021-12-30 | Discharge: 2022-01-01 | Disposition: A | Discharge status: Home / Self Care | Payer: Medicaid Other | Attending: Emergency Medicine | Admitting: Emergency Medicine

## 2021-12-30 DIAGNOSIS — F419 Anxiety disorder, unspecified: Secondary | ICD-10-CM | POA: Insufficient documentation

## 2021-12-30 DIAGNOSIS — Z01818 Encounter for other preprocedural examination: Secondary | ICD-10-CM | POA: Insufficient documentation

## 2021-12-30 DIAGNOSIS — R45851 Suicidal ideations: Secondary | ICD-10-CM | POA: Insufficient documentation

## 2021-12-30 DIAGNOSIS — I10 Essential (primary) hypertension: Secondary | ICD-10-CM | POA: Insufficient documentation

## 2021-12-30 DIAGNOSIS — F339 Major depressive disorder, recurrent, unspecified: Secondary | ICD-10-CM | POA: Insufficient documentation

## 2021-12-30 LAB — CBC
HCT: 40.1 % (ref 36.0–46.0)
Hemoglobin: 13.3 g/dL (ref 12.0–15.0)
MCH: 27.8 pg (ref 26.0–34.0)
MCHC: 33.2 g/dL (ref 30.0–36.0)
MCV: 83.7 fL (ref 80.0–100.0)
Platelets: 408 10*3/uL — ABNORMAL HIGH (ref 150–400)
RBC: 4.79 MIL/uL (ref 3.87–5.11)
RDW: 13.2 % (ref 11.5–15.5)
WBC: 9.3 10*3/uL (ref 4.0–10.5)
nRBC: 0 % (ref 0.0–0.2)

## 2021-12-30 LAB — COMPREHENSIVE METABOLIC PANEL
ALT: 22 U/L (ref 0–44)
AST: 17 U/L (ref 15–41)
Albumin: 4.2 g/dL (ref 3.5–5.0)
Alkaline Phosphatase: 133 U/L — ABNORMAL HIGH (ref 38–126)
Anion gap: 8 (ref 5–15)
BUN: 12 mg/dL (ref 6–20)
CO2: 23 mmol/L (ref 22–32)
Calcium: 9.2 mg/dL (ref 8.9–10.3)
Chloride: 108 mmol/L (ref 98–111)
Creatinine, Ser: 0.73 mg/dL (ref 0.44–1.00)
GFR, Estimated: >60 mL/min (ref >60)
Glucose, Bld: 153 mg/dL — ABNORMAL HIGH (ref 70–99)
Potassium: 3.7 mmol/L (ref 3.5–5.1)
Sodium: 139 mmol/L (ref 135–145)
Total Bilirubin: 0.3 mg/dL (ref 0.3–1.2)
Total Protein: 7.9 g/dL (ref 6.5–8.1)

## 2021-12-30 LAB — ETHANOL: Alcohol, Ethyl (B): <10 mg/dL (ref <10)

## 2021-12-30 LAB — SALICYLATE LEVEL: Salicylate Lvl: <7 mg/dL — ABNORMAL LOW (ref 7.0–30.0)

## 2021-12-30 LAB — ACETAMINOPHEN LEVEL: Acetaminophen (Tylenol), Serum: <10 ug/mL — ABNORMAL LOW (ref 10–30)

## 2021-12-30 NOTE — ED Triage Notes (Signed)
Pt states her depression is high and she is not sleeping well. Pt told her kids to take her gun in case she started having SI thoughts.

## 2021-12-31 DIAGNOSIS — F339 Major depressive disorder, recurrent, unspecified: Secondary | ICD-10-CM

## 2021-12-31 LAB — POC URINE PREG, ED: Preg Test, Ur: NEGATIVE

## 2021-12-31 LAB — RAPID URINE DRUG SCREEN, HOSP PERFORMED
Amphetamines: NOT DETECTED
Barbiturates: NOT DETECTED
Benzodiazepines: NOT DETECTED
Cocaine: NOT DETECTED
Opiates: NOT DETECTED
Tetrahydrocannabinol: NOT DETECTED

## 2021-12-31 MED ORDER — HYDROXYZINE HCL 10 MG PO TABS: 10.0000 mg | ORAL_TABLET | Freq: Every day | ORAL | Status: DC | PRN

## 2021-12-31 MED ORDER — IPRATROPIUM BROMIDE 0.03 % NA SOLN: 2.0000 | Freq: Two times a day (BID) | NASAL | Status: DC

## 2021-12-31 MED ORDER — MOMETASONE FURO-FORMOTEROL FUM 200-5 MCG/ACT IN AERO
2.0000 | INHALATION_SPRAY | Freq: Two times a day (BID) | RESPIRATORY_TRACT | Status: DC
  Filled 2021-12-31: qty 8.8

## 2021-12-31 MED ORDER — IPRATROPIUM-ALBUTEROL 0.5-2.5 (3) MG/3ML IN SOLN: 3.0000 mL | Freq: Four times a day (QID) | RESPIRATORY_TRACT | Status: DC | PRN

## 2021-12-31 MED ORDER — HYDROXYZINE HCL 25 MG PO TABS
25.0000 mg | ORAL_TABLET | Freq: Once | ORAL | Status: AC
  Administered 2021-12-31: 25 mg via ORAL
  Filled 2021-12-31: qty 1

## 2021-12-31 MED ORDER — MIRTAZAPINE 15 MG PO TABS
15.0000 mg | ORAL_TABLET | Freq: Every day | ORAL | Status: DC
  Administered 2021-12-31: 15 mg via ORAL
  Filled 2021-12-31: qty 1

## 2021-12-31 MED ORDER — VENLAFAXINE HCL ER 37.5 MG PO CP24
225.0000 mg | ORAL_CAPSULE | Freq: Every day | ORAL | Status: DC
  Administered 2022-01-01: 225 mg via ORAL
  Filled 2021-12-31: qty 6

## 2021-12-31 MED ORDER — ALBUTEROL SULFATE HFA 108 (90 BASE) MCG/ACT IN AERS: 2.0000 | INHALATION_SPRAY | RESPIRATORY_TRACT | Status: DC | PRN

## 2021-12-31 MED ORDER — DILTIAZEM HCL ER COATED BEADS 120 MG PO CP24
120.0000 mg | ORAL_CAPSULE | Freq: Every day | ORAL | Status: DC
  Administered 2021-12-31 – 2022-01-01 (×2): 120 mg via ORAL
  Filled 2021-12-31 (×2): qty 1

## 2021-12-31 MED ORDER — ALBUTEROL SULFATE (2.5 MG/3ML) 0.083% IN NEBU: 2.5000 mg | INHALATION_SOLUTION | RESPIRATORY_TRACT | Status: DC | PRN

## 2021-12-31 MED ORDER — IRBESARTAN 150 MG PO TABS
300.0000 mg | ORAL_TABLET | Freq: Every day | ORAL | Status: DC
  Administered 2021-12-31 – 2022-01-01 (×2): 300 mg via ORAL
  Filled 2021-12-31 (×2): qty 2

## 2021-12-31 MED ORDER — LORATADINE 10 MG PO TABS
10.0000 mg | ORAL_TABLET | Freq: Every day | ORAL | Status: DC
  Administered 2021-12-31: 10 mg via ORAL
  Filled 2021-12-31 (×2): qty 1

## 2021-12-31 NOTE — ED Notes (Addendum)
Patients father Zniya Cottone arrived at the ED per the pts request to pick up some of the patients belongings. He will be taking the patients purse, wallet, money and car keys. Pts father is waiting for security to get belongings out of the safe. Pt has agreed that belongings are in the purse. Security confirmed with patient all money, keys and belongings are present and correct. and completed the protocol for paperwork. Father has left the building with pts, purse, wallet, money, and car keys.
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
## 2021-12-31 NOTE — Consult Note (Cosign Needed Addendum)
Telepsych Consultation   Reason for Consult: Depression Referring Physician: Dr. Stark Jock Location of Patient: [id number] Location of Provider: [facility]  Patient Identification: Nancy Barnes  MRN:  [id number]  Principal Diagnosis: Major depressive disorder recurrent.  Diagnosis: SI  Total Time spent with patient: 30 minutes  Subjective:   Nancy Barnes is a 51 y.o. female patient admitted for evaluation of suicidal thoughts.  HPI:  Nancy Barnes is a 51 y.o. female with past medical history of depression, anxiety, hypertension, GERD.  Patient presenting today for evaluation of suicidal thoughts.  She reports depression recently and states finances and her children's drug use as inciting events.  She reports having what she describes as "panic attacks" throughout the day today.  She asked her children to secure her firearm to "take that temptation away".  Assessment: Patient was seen and examined via telepsych sitting in a private room at [facility] ED.  Alert and oriented x 4, speech clear coherent with normal volume and pattern.  Patient presents with anxious and depressed mood, and affect appropriate and congruent.  During encounter patient reports that she had a panic attack which she had not experienced for a long time.  Patient thought it was from a drug resistance of Effexor that she is currently taking.  Reports she had similar episode in 2015 when she was resistant to Pristiq and was treated with TCMS therapy.  Patient reports giving her gun to her children, just in case it was her depression causing her anxiety attack. 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  Patient plans to find a new job by first of the year patient.  Reports that she never thought about harming or hurting herself.  Further reports that she was  only being proactive by giving her gun to her adult children.  Reports her gun is for self protection and not to harm herself.  Urine drug screen negative for any substance.  BAL less than 10.  CMP: Glucose 153 high, alkaline phosphatase 133 high, otherwise normal.  CBC: Platelet 408 high, otherwise normal.  Patient to follow-up with her outpatient PCP for all abnormal labs.  Patient reports that she has an appointment scheduled with Dr.Agarwal, her psychiatrist on 01/02/2022.  She denies SI/HI/AVH.  Denies SIB or suicide attempt in the past.  Denies drug use, alcohol use, marijuana use or tobacco use.  Reports sleeping for 6 hours last night and having a good appetite.  Reports access to firearms as indicated above.  Above note discussed with the treatment team and consult with Dr. Caswell Corwin via this note.  Disposition: Patient is not at imminent danger to self or others, she did not meet the criteria for inpatient psychiatric admission.  Safety and risk modification plans discussed with patient.  She is to see her psychiatric as scheduled on 01/02/2022.  She was able to contract for safety.  Patient is conscious about sharp objects around the house.  She reiterated that she never thought about hurting herself.  Her gun is kept away from her at this time.  Supportive therapy provided on ongoing stressors.  Instructed to call 911, suicide hotline, or go to nearby emergency room in case of crisis.  [facility] treatment staff and [facility], ED physician made aware of patient disposition.  Past Psychiatric History: Generalized anxiety disorder,  major depressive disorder recurrent.  Patient was admitted, treated and discharged from [facility] for major depressive disorder recurrent severe, generalized anxiety disorder in 2015.  Risk to Self:  No Risk to Others:  No Prior Inpatient Therapy:  Yes Prior Outpatient Therapy:  Yes  Past Medical History:  Past Medical History:  Diagnosis Date   Anxiety     Asthma    Chicken pox    Complication of anesthesia    unable to tolerate NSAIDS   Depression    Environmental allergies    food/medication   Family history of adverse reaction to anesthesia    mother skin glue allergy post surgery    GERD (gastroesophageal reflux disease)    Headache(784.0)    Hypertension    Hypokalemia    Pneumomediastinum (HCC)    Seasonal allergies    Sleep apnea    Suicidal ideations    UTI (urinary tract infection)     Past Surgical History:  Procedure Laterality Date   ABLATION     BREAST BIOPSY  04/2011   BREAST REDUCTION SURGERY Bilateral 09/14/2019   Procedure: BILATERAL BREAST REDUCTION WITH LIPOSUCTION;  Surgeon: Peggye Form, DO;  Location: [facility];  Service: Plastics;  Laterality: Bilateral;  3 hours   TUBAL LIGATION     Family History:  Family History  Problem Relation Age of Onset   Heart attack Mother 35       Bypass x5   Allergies Mother    CAD Mother    High blood pressure Mother    High Cholesterol Mother    Heart disease Mother    Arthritis Mother    Heart failure Mother    Diabetes Mellitus II Father    Hypertension Father    Heart disease Father    CAD Father    Other Father        Agent orange exposure   Arthritis Father    Depression Father    Atrial fibrillation Father    Allergies Maternal Grandmother    Atrial fibrillation Maternal Grandmother    Stroke Maternal Grandmother    Asthma Maternal Grandfather    Asthma Paternal Grandmother    Asthma Paternal Grandfather    Allergies Daughter    Psoriasis Daughter    Arthritis Daughter        psoriatic   Hypothyroidism Daughter    Suicidality Neg Hx    Anxiety disorder Neg Hx    Family Psychiatric  History: Patient's daughter diagnosed with depression, and social anxiety disorder.  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 Lives at home with 2 teenage children. Never smoker.   Social Determinants of Health   Financial Resource Strain: Medium Risk (05/03/2021)   Overall Financial Resource Strain (CARDIA)    Difficulty of Paying Living Expenses: Somewhat hard  Food Insecurity: Unknown (05/03/2021)   Hunger Vital Sign    Worried About Running Out of Food in the Last Year: Patient refused    [place] in the Last Year: Patient refused  Transportation Needs: No Transportation Needs (05/03/2021)   PRAPARE - Hydrologist (Medical): No    Lack of Transportation (Non-Medical): No  Physical Activity: Unknown (05/03/2021)   Exercise Vital Sign    Days of Exercise per Week: 0 days    Minutes of Exercise per Session: Not on file  Stress: Stress Concern Present (05/03/2021)   [place]    Feeling of Stress : Rather much  Social Connections: Moderately Integrated (05/03/2021)   Social Connection and Isolation Panel [NHANES]    Frequency of Communication with Friends and Family: More than  three times a week    Frequency of Social Gatherings with Friends and Family: More than three times a week    Attends Religious Services: More than 4 times per year    Active Member of [employer] or Organizations: Yes    Attends Music therapist: More than 4 times per year    Marital Status: Divorced   Additional Social History:   Allergies:   Allergies  Allergen Reactions   Nsaids Anaphylaxis and Shortness Of Breath   Shellfish Allergy Anaphylaxis   Abilify [Aripiprazole] Other (See Comments)    hallucinations   Amlodipine     swelling   Amoxicillin Hives   Fish Allergy Other (See Comments)    Reaction unknown   Latuda [Lurasidone Hcl] Other (See Comments)    Uncontrollable shaking, hopelessness.   Levaquin [Levofloxacin In D5w] Other (See Comments)    Reaction unknown   Other Other (See Comments)    Reaction unknown to nuts   Penicillins Other (See Comments)    Reaction unknown Has patient had a PCN reaction causing immediate rash, facial/tongue/throat swelling, SOB or lightheadedness with hypotension: Yes Has patient had a PCN reaction causing severe rash involving mucus membranes or skin necrosis: No Has patient had a PCN reaction that required hospitalization: No Has patient had a PCN reaction occurring within the last 10 years: No If all of the above answers are "NO", then may proceed with Cephalosporin use.   Prednisone Other (See Comments)    Pruritus/ hives   Seroquel [Quetiapine Fumarate] Other (See Comments)    Patient states it made her not care about the world and she just sat down and didn't get up.   Ativan [Lorazepam] Anxiety    Shakiness   Sulfa Antibiotics Rash   Xanax [Alprazolam] Anxiety    Makes pt. anxious    Labs:  Results for orders placed or performed during the hospital encounter of 12/30/21 (from the past 48 hour(s))  Comprehensive metabolic panel     Status: Abnormal   Collection Time: 12/30/21  9:32 PM  Result Value Ref Range    Sodium 139 135 - 145 mmol/L   Potassium 3.7 3.5 - 5.1 mmol/L   Chloride 108 98 -  111 mmol/L   CO2 23 22 - 32 mmol/L   Glucose, Bld 153 (H) 70 - 99 mg/dL    Comment: Glucose reference range applies only to samples taken after fasting for at least 8 hours.   BUN 12 6 - 20 mg/dL   Creatinine, Ser 0.73 0.44 - 1.00 mg/dL   Calcium 9.2 8.9 - 10.3 mg/dL   Total Protein 7.9 6.5 - 8.1 g/dL   Albumin 4.2 3.5 - 5.0 g/dL   AST 17 15 - 41 U/L   ALT 22 0 - 44 U/L   Alkaline Phosphatase 133 (H) 38 - 126 U/L   Total Bilirubin 0.3 0.3 - 1.2 mg/dL   GFR, Estimated >60 >60 mL/min    Comment: (NOTE) Calculated using the CKD-EPI Creatinine Equation (2021)    Anion gap 8 5 - 15    Comment: Performed at [facility], [address], [place], [place] [address]  Ethanol     Status: None   Collection Time: 12/30/21  9:32 PM  Result Value Ref Range   Alcohol, Ethyl (B) <10 <10 mg/dL    Comment: (NOTE) Lowest detectable limit for serum alcohol is 10 mg/dL.  For medical purposes only. Performed at [facility], [address], [place], [place] [id number]   Salicylate level     Status: Abnormal   Collection Time: 12/30/21  9:32 PM  Result Value Ref Range   Salicylate Lvl [id number] (L) 7.0 - 30.0 mg/dL    Comment: Performed at [facility], [address] [place], [place] [address]  Acetaminophen level     Status: Abnormal   Collection Time: 12/30/21  9:32 PM  Result Value Ref Range   Acetaminophen (Tylenol), Serum <10 (L) 10 - 30 ug/mL    Comment: (NOTE) Therapeutic concentrations vary significantly. A range of 10-30 ug/mL  may be an effective concentration for many patients. However, some  are best treated at concentrations outside of this range. Acetaminophen concentrations >150 ug/mL at 4 hours after ingestion  and >50 ug/mL at 12 hours after ingestion are often associated with  toxic reactions.  Performed at [facility], [address] [place], [place] [address]   cbc     Status: Abnormal    Collection Time: 12/30/21  9:32 PM  Result Value Ref Range   WBC 9.3 4.0 - 10.5 K/uL   RBC 4.79 3.87 - 5.11 MIL/uL   Hemoglobin 13.3 12.0 - 15.0 g/dL   HCT 40.1 36.0 - 46.0 %   MCV 83.7 80.0 - 100.0 fL   MCH 27.8 26.0 - 34.0 pg   MCHC 33.2 30.0 - 36.0 g/dL   RDW 13.2 11.5 - 15.5 %   Platelets 408 (H) 150 - 400 K/uL   nRBC 0.0 0.0 - 0.2 %    Comment: Performed at [facility], [address], [place], [place] [address]  Rapid urine drug screen (hospital performed)     Status: None   Collection Time: 12/31/21 12:40 AM  Result Value Ref Range   Opiates NONE DETECTED NONE DETECTED   Cocaine NONE DETECTED NONE DETECTED   Benzodiazepines NONE DETECTED NONE DETECTED   Amphetamines NONE DETECTED NONE DETECTED   Tetrahydrocannabinol NONE DETECTED NONE DETECTED   Barbiturates NONE DETECTED NONE DETECTED    Comment: (NOTE) DRUG SCREEN FOR MEDICAL PURPOSES ONLY.  IF CONFIRMATION IS NEEDED FOR ANY PURPOSE, NOTIFY LAB WITHIN 5 DAYS.  LOWEST DETECTABLE LIMITS FOR URINE DRUG SCREEN Drug Class  Cutoff (ng/mL) Amphetamine and metabolites    1000 Barbiturate and metabolites    200 Benzodiazepine                 200 Opiates and metabolites        300 Cocaine and metabolites        300 THC                            50 Performed at [facility], [address], [place], [place] [address]   POC urine preg, ED     Status: None   Collection Time: 12/31/21 12:43 AM  Result Value Ref Range   Preg Test, Ur NEGATIVE NEGATIVE    Comment:        THE SENSITIVITY OF THIS METHODOLOGY IS >24 mIU/mL     Medications:  Current Facility-Administered Medications  Medication Dose Route Frequency Provider Last Rate Last Admin   albuterol (PROVENTIL) (2.5 MG/3ML) 0.083% nebulizer solution 2.5 mg  2.5 mg Nebulization Q4H PRN Wynona Dove A, DO       diltiazem (CARDIZEM CD) 24 hr capsule 120 mg  120 mg Oral Daily Wynona Dove A, DO   120 mg at 12/31/21 1345   hydrOXYzine (ATARAX) tablet 10  mg  10 mg Oral Daily PRN Wynona Dove A, DO       ipratropium-albuterol (DUONEB) 0.5-2.5 (3) MG/3ML nebulizer solution 3 mL  3 mL Nebulization Q6H PRN Wynona Dove A, DO       irbesartan (AVAPRO) tablet 300 mg  300 mg Oral Daily Wynona Dove A, DO       loratadine (CLARITIN) tablet 10 mg  10 mg Oral Daily Wynona Dove A, DO   10 mg at 12/31/21 1345   mirtazapine (REMERON) tablet 15 mg  15 mg Oral QHS Wynona Dove A, DO       mometasone-formoterol (DULERA) 200-5 MCG/ACT inhaler 2 puff  2 puff Inhalation BID Jeanell Sparrow, DO       [START ON 01/01/2022] venlafaxine XR (EFFEXOR-XR) 24 hr capsule 225 mg  225 mg Oral Q breakfast Jeanell Sparrow, DO       Current Outpatient Medications  Medication Sig Dispense Refill   albuterol (VENTOLIN HFA) 108 (90 Base) MCG/ACT inhaler INHALE 2 PUFFS BY MOUTH EVERY 4 HOURS AS NEEDED FOR WHEEZING AND FOR SHORTNESS OF BREATH (Patient taking differently: Inhale 2 puffs into the lungs every 4 (four) hours as needed for wheezing or shortness of breath.) 18 g 12   budesonide (PULMICORT) 0.5 MG/2ML nebulizer solution Take 2 mLs (0.5 mg total) by nebulization daily. (Patient taking differently: Take 0.5 mg by nebulization daily as needed (shortness of breath (winter months)).) 60 mL 5   cetirizine (ZYRTEC) 10 MG chewable tablet Chew 10 mg by mouth daily.     diltiazem (CARDIZEM CD) 120 MG 24 hr capsule Take 1 capsule (120 mg total) by mouth daily. 30 capsule 5   EPINEPHrine 0.3 mg/0.3 mL IJ SOAJ injection Inject 0.3 mg into the muscle as needed for anaphylaxis. 1 each 5   fluticasone-salmeterol (WIXELA INHUB) 250-50 MCG/ACT AEPB Inhale 1 puff then rinse mouth, twice daily 60 each 12   hydrOXYzine (ATARAX) 10 MG tablet Take 1 tablet (10 mg total) by mouth daily as needed for anxiety. 90 tablet 0   ipratropium (ATROVENT) 0.03 % nasal spray Place 2 sprays into the nose 2 (two) times daily. 2 sprays each nostril twice daily 30 mL  5   ipratropium-albuterol (DUONEB) 0.5-2.5 (3)  MG/3ML SOLN Take 3 mLs by nebulization every 6 (six) hours as needed (shortness of breath/wheezing.). 360 mL 5   irbesartan (AVAPRO) 300 MG tablet Take 1 tablet (300 mg total) by mouth daily. 30 tablet 5   mirtazapine (REMERON) 15 MG tablet Take 1 tablet (15 mg total) by mouth at bedtime. 15 tablet 0   venlafaxine XR (EFFEXOR-XR) 75 MG 24 hr capsule Take 3 capsules (225 mg total) by mouth daily with breakfast. 270 capsule 0    Musculoskeletal: Strength & Muscle Tone: within normal limits Gait & Station: normal Patient leans: N/A  Psychiatric Specialty Exam:  Presentation  General Appearance:  Appropriate for Environment; Bizarre  Eye Contact: Good  Speech: Clear and Coherent  Speech Volume: Normal  Handedness: Right  Mood and Affect  Mood: Anxious; Depressed  Affect: Appropriate; Congruent  Thought Process  Thought Processes: Coherent; Goal Directed  Descriptions of Associations:Intact  Orientation:Full (Time, Place and Person)  Thought Content:Logical  History of Schizophrenia/Schizoaffective disorder:No  Duration of Psychotic Symptoms:No data recorded Hallucinations:Hallucinations: None  Ideas of Reference:None  Suicidal Thoughts:Suicidal Thoughts: No  Homicidal Thoughts:Homicidal Thoughts: No  Sensorium  Memory: Immediate Good; Recent Good; Remote Good  Judgment: Intact  Insight: Present  Executive Functions  Concentration: Good  Attention Span: Good  Recall: Good  Fund of Knowledge: Good  Language: Good  Psychomotor Activity  Psychomotor Activity: Psychomotor Activity: Normal  Assets  Assets: Communication Skills; Desire for Improvement; Physical Health; Vocational/Educational  Sleep  Sleep: Sleep: Good Number of Hours of Sleep: 6  Physical Exam: Physical Exam Vitals and nursing note reviewed.    Review of Systems  Constitutional: Negative.   HENT: Negative.    Eyes: Negative.   Cardiovascular: Negative.    Gastrointestinal: Negative.   Skin: Negative.   Neurological: Negative.   Endo/Heme/Allergies: Negative.   Psychiatric/Behavioral:  Positive for depression and suicidal ideas. The patient is nervous/anxious.    Blood pressure 135/84, pulse 83, temperature 98.4 F (36.9 C), temperature source Oral, resp. rate 18, height 5\' 4"  (1.626 m), weight 78 kg, SpO2 98 %. Body mass index is 29.52 kg/m.  Treatment Plan Summary: Daily contact with patient to assess and evaluate symptoms and progress in treatment and Medication management  Disposition: No evidence of imminent risk to self or others at present.  Patient is to be discharged to home when medically stable.  Safety and risk modification plans discussed with patient as indicated above.  This service was provided via telemedicine using a 2-way, interactive audio and video technology.  Names of all persons participating in this telemedicine service and their role in this encounter. Name: Purcell Mouton Role: Patient  Name: Garrison [place] Role: NP provider  Name: Dr. Caswell Corwin Role: [facility] medical Director  Name: Dr. Stark Jock Role: [facility], ED physician    Laretta Bolster, [place] 12/31/2021 1:54 PM

## 2021-12-31 NOTE — ED Notes (Signed)
Pt having TTS assessment done

## 2021-12-31 NOTE — ED Notes (Signed)
Pts belongings are locked up in a locker. ( Shirt, pants, socks, crocs, jacket, purse)  Security has pts money and wallet locked up.

## 2021-12-31 NOTE — ED Provider Notes (Signed)
[facility] EMERGENCY DEPARTMENT Provider Note   CSN: [id number] Arrival date & time: 12/30/21  2038     History  Chief Complaint  Patient presents with   Medical Clearance    Nancy Barnes is a 51 y.o. female.  Patient is a 51 year old female with past medical history of depression, anxiety, hypertension, GERD.  Patient presenting today for evaluation of suicidal thoughts.  She reports depression recently and states finances and her children's drug use as inciting events.  She reports having what she describes as "panic attacks" throughout the day today.  She asked her children to secure her firearm to "take that temptation away".  The history is provided by the patient.       Home Medications Prior to Admission medications   Medication Sig Start Date End Date Taking? Authorizing Provider  albuterol (VENTOLIN HFA) 108 (90 Base) MCG/ACT inhaler INHALE 2 PUFFS BY MOUTH EVERY 4 HOURS AS NEEDED FOR WHEEZING AND FOR SHORTNESS OF BREATH Patient taking differently: Inhale 2 puffs into the lungs every 4 (four) hours as needed for wheezing or shortness of breath. 03/25/21   Jetty Duhamel D, MD  budesonide (PULMICORT) 0.5 MG/2ML nebulizer solution Take 2 mLs (0.5 mg total) by nebulization daily. Patient taking differently: Take 0.5 mg by nebulization daily as needed (shortness of breath (winter months)). 12/12/19   Wynn Banker, MD  cetirizine (ZYRTEC) 10 MG chewable tablet Chew 10 mg by mouth daily.    [provider]  diltiazem (CARDIZEM CD) 120 MG 24 hr capsule Take 1 capsule (120 mg total) by mouth daily. 05/13/21   Koberlein, Jannette Spanner C, MD  EPINEPHrine 0.3 mg/0.3 mL IJ SOAJ injection Inject 0.3 mg into the muscle as needed for anaphylaxis. 03/25/21   Waymon Budge, MD  fluticasone-salmeterol [facility] INHUB) 250-50 MCG/ACT AEPB Inhale 1 puff then rinse mouth, twice daily 03/25/21   Jetty Duhamel D, MD  hydrOXYzine (ATARAX) 10 MG tablet Take 1 tablet (10 mg total) by  mouth daily as needed for anxiety. 05/02/21   [name], MD  ipratropium (ATROVENT) 0.03 % nasal spray Place 2 sprays into the nose 2 (two) times daily. 2 sprays each nostril twice daily 12/12/19   Koberlein, Jannette Spanner C, MD  ipratropium-albuterol (DUONEB) 0.5-2.5 (3) MG/3ML SOLN Take 3 mLs by nebulization every 6 (six) hours as needed (shortness of breath/wheezing.). 12/12/19   Koberlein, Paris Lore, MD  irbesartan (AVAPRO) 300 MG tablet Take 1 tablet (300 mg total) by mouth daily. 09/13/21   Karie Georges, MD  mirtazapine (REMERON) 15 MG tablet Take 1 tablet (15 mg total) by mouth at bedtime. 12/19/21 12/19/22  [name], MD  venlafaxine XR (EFFEXOR-XR) 75 MG 24 hr capsule Take 3 capsules (225 mg total) by mouth daily with breakfast. 12/19/21   [name], MD      Allergies    Nsaids, Shellfish allergy, Abilify [aripiprazole], Amlodipine, Amoxicillin, Fish allergy, Latuda [lurasidone hcl], Levaquin [levofloxacin in d5w], Other, Penicillins, Prednisone, Seroquel [quetiapine fumarate], Ativan [lorazepam], Sulfa antibiotics, and Xanax [alprazolam]    Review of Systems   Review of Systems  All other systems reviewed and are negative.   Physical Exam Updated Vital Signs BP (!) 179/112 (BP Location: Right Arm)   Pulse 93   Temp 98.1 F (36.7 C) (Oral)   Ht 5\' 4"  (1.626 m)   Wt 78 kg   SpO2 97%   BMI 29.52 kg/m  Physical Exam Vitals and nursing note reviewed.  Constitutional:  General: She is not in acute distress.    Appearance: She is well-developed. She is not diaphoretic.  HENT:     Head: Normocephalic and atraumatic.  Cardiovascular:     Rate and Rhythm: Normal rate and regular rhythm.     Heart sounds: No murmur heard.    No friction rub. No gallop.  Pulmonary:     Effort: Pulmonary effort is normal. No respiratory distress.     Breath sounds: Normal breath sounds. No wheezing.  Abdominal:     General: Bowel sounds are normal. There is no distension.      Palpations: Abdomen is soft.     Tenderness: There is no abdominal tenderness.  Musculoskeletal:        General: Normal range of motion.     Cervical back: Normal range of motion and neck supple.  Skin:    General: Skin is warm and dry.  Neurological:     General: No focal deficit present.     Mental Status: She is alert and oriented to person, place, and time.  Psychiatric:        Attention and Perception: Attention normal.        Mood and Affect: Mood normal.        Speech: Speech normal.        Behavior: Behavior normal.        Thought Content: Thought content includes suicidal ideation. Thought content does not include homicidal ideation. Thought content does not include homicidal or suicidal plan.        Cognition and Memory: Cognition normal.     ED Results / Procedures / Treatments   Labs (all labs ordered are listed, but only abnormal results are displayed) Labs Reviewed  COMPREHENSIVE METABOLIC PANEL - Abnormal; Notable for the following components:      Result Value   Glucose, Bld 153 (*)    Alkaline Phosphatase 133 (*)    All other components within normal limits  SALICYLATE LEVEL - Abnormal; Notable for the following components:   Salicylate Lvl <7.0 (*)    All other components within normal limits  ACETAMINOPHEN LEVEL - Abnormal; Notable for the following components:   Acetaminophen (Tylenol), Serum <10 (*)    All other components within normal limits  CBC - Abnormal; Notable for the following components:   Platelets 408 (*)    All other components within normal limits  ETHANOL  RAPID URINE DRUG SCREEN, HOSP PERFORMED  POC URINE PREG, ED    EKG None  Radiology No results found.  Procedures Procedures    Medications Ordered in ED Medications - No data to display  ED Course/ Medical Decision Making/ A&P  Patient presenting here with complaints of suicidal thoughts, the details of which are described in the HPI.  She has been admitted in the past on  1 occasion for similar reasons.  She arrives here with stable vital signs and is clinically well-appearing.  Laboratory studies obtained including CBC, metabolic panel, EtOH, and salicylate and acetaminophen levels.  Studies all unremarkable.  Urine drug screen is negative.  Patient to undergo TTS evaluation with final disposition pending.  Final Clinical Impression(s) / ED Diagnoses Final diagnoses:  None    Rx / DC Orders ED Discharge Orders     None         Geoffery Lyons, MD 12/31/21 [phone]

## 2021-12-31 NOTE — ED Notes (Signed)
Patient expressed she does not feel safe to leave. She is feeling panic and anxiety. She has expressed the desire and need to stay in the hospital. I have informed TTS of this concern. TTS is suggesting discharge before informed of patient safety concern. RN requested a re-evaluation for patient safety.

## 2022-01-01 MED ORDER — MOMETASONE FURO-FORMOTEROL FUM 200-5 MCG/ACT IN AERO: 2.0000 | INHALATION_SPRAY | Freq: Two times a day (BID) | RESPIRATORY_TRACT | Status: DC | PRN

## 2022-01-01 NOTE — ED Notes (Signed)
Report given to Stephanie

## 2022-01-01 NOTE — ED Notes (Signed)
Pt received breakfast tray 

## 2022-01-01 NOTE — Consult Note (Signed)
Telepsych Consultation   Reason for Consult: Depression  Referring Physician: Dr. Judd Lien  Location of Patient: [facility]  Location of Provider: Behavioral Health TTS Department  Patient Identification: Nancy Barnes MRN:  [id number] Principal Diagnosis:  Major depressive disorder recurrent.  Diagnosis:  SI  Total Time spent with patient: 30 minutes  Subjective:   Nancy Barnes is a 51 y.o. female patient admitted for evaluation of suicidal thoughts.  .  Assessment: Patient seen and examined via telepsych sitting in a private room at [facility] ED.  Presents alert and oriented x 4 with clear, coherent and normal speech volume and pattern.  Presents with euthymic mood today and congruent affect.  Patient reports smiling, after the assessment yesterday, I had a panic attack and could not go home as planned.  Further added, "I am fine and can be discharged today to follow-up with my psychiatric appointment tomorrow."  Patient repeats back the Safety and Risk modification plans discussed yesterday and plans to follow through with them.  Reports she will continue to take her medication as ordered.  Patient contracts for safety again today.  She did not appear to be responding to internal or external stimuli and no signs of psychosis. Reports that she never thought about harming herself.  Patient again reiterated that she was only being proactive by giving her gun to her adult children.  Reports her gun is for self protection and not to harm herself.  Urine drug screen negative for any substance.  BAL less than 10.  CMP: Glucose 153 high, alkaline phosphatase 133 high, otherwise normal.  CBC: Platelet 408 high, otherwise normal.   Collateral information: Patient's daughter Nancy Barnes at [phone] called for more information regarding patient.  Patient daughter states that she is in agreement for patient to be discharged.  She also agreed with the safety and risk modification plan and  discussed.   She denies SI/HI/AVH.  Denies SIB or suicide attempt in the past.  Denies drug use, alcohol use, marijuana use or tobacco use.  Reports sleeping for 6 hours last night and having a good appetite.  Reports access to firearms and being put away by children as indicated above.  Above note discussed with the treatment team and consult with Dr. Sherron Flemings via this note.  HPI:   Nancy Barnes is a 51 y.o. female with past medical history of depression, anxiety, hypertension, GERD.  Patient presenting today for evaluation of suicidal thoughts.  She reports depression recently and states finances and her children's drug use as inciting events.  She reports having what she describes as "panic attacks" throughout the day today.  She asked her children to secure her firearm to "take that temptation away".   Disposition: Patient is not at imminent danger to self or others, she does not meet the criteria for inpatient psychiatric admission.  She is psych clear.  Safety and risk modification plans discussed with patient.  She is to see her psychiatrist as scheduled on 01/02/2022.  She was able to contract for safety.  Patient is conscious about sharp objects around the house.  She reiterated that she never thought about hurting herself.  Her gun is kept away from her at this time.  Supportive therapy provided on ongoing stressors.  Instructed to call 911, suicide hotline, or go to nearby emergency room in case of crisis.  [facility] staff and [facility], ED physician made aware of patient disposition.   Past  Psychiatric History:  Generalized anxiety disorder, major depressive disorder recurrent.  [facility] depressive disorder recurrent severe, generalized anxiety disorder in 2015.    Risk to Self:  No Risk to Others:  No Prior Inpatient Therapy:  Yes Prior Outpatient Therapy:  Yes  Past Medical History:  Past Medical  History:  Diagnosis Date   Anxiety    Asthma    Chicken pox    Complication of anesthesia    unable to tolerate NSAIDS   Depression    Environmental allergies    food/medication   Family history of adverse reaction to anesthesia    mother skin glue allergy post surgery    GERD (gastroesophageal reflux disease)    Headache(784.0)    Hypertension    Hypokalemia    Pneumomediastinum (HCC)    Seasonal allergies    Sleep apnea    Suicidal ideations    UTI (urinary tract infection)     Past Surgical History:  Procedure Laterality Date   ABLATION     BREAST BIOPSY  04/2011   BREAST REDUCTION SURGERY Bilateral 09/14/2019   Procedure: BILATERAL BREAST REDUCTION WITH LIPOSUCTION;  Surgeon: Peggye Formillingham, Claire S, DO;  Location: [facility];  Service: Plastics;  Laterality: Bilateral;  3 hours   TUBAL LIGATION     Family History:  Family History  Problem Relation Age of Onset   Heart attack Mother 2160       Bypass x5   Allergies Mother    CAD Mother    High blood pressure Mother    High Cholesterol Mother    Heart disease Mother    Arthritis Mother    Heart failure Mother    Diabetes Mellitus II Father    Hypertension Father    Heart disease Father    CAD Father    Other Father        Agent orange exposure   Arthritis Father    Depression Father    Atrial fibrillation Father    Allergies Maternal Grandmother    Atrial fibrillation Maternal Grandmother    Stroke Maternal Grandmother    Asthma Maternal Grandfather    Asthma Paternal Grandmother    Asthma Paternal Grandfather    Allergies Daughter    Psoriasis Daughter    Arthritis Daughter        psoriatic   Hypothyroidism Daughter    Suicidality Neg Hx    Anxiety disorder Neg Hx    Family Psychiatric  History: Patient's daughter diagnosed with depression, and social anxiety disorder.   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 Lives at home with 2 teenage children. Never smoker.   Social Determinants of Health   Financial Resource Strain: Medium Risk (05/03/2021)   Overall Financial Resource Strain (CARDIA)    Difficulty of Paying Living Expenses: Somewhat hard  Food Insecurity: Unknown (05/03/2021)   Hunger Vital Sign    Worried About Running Out of Food in the Last Year: Patient refused    Ran Out of Food in the Last Year: Patient refused  Transportation Needs: No Transportation Needs (05/03/2021)   PRAPARE - Administrator, Civil Service (Medical): No    Lack of Transportation (Non-Medical): No  Physical Activity: Unknown (05/03/2021)   Exercise Vital Sign    Days of Exercise per Week: 0 days    Minutes of Exercise per Session: Not on file  Stress: Stress Concern Present (05/03/2021)   [employer] of Occupational Health - Occupational Stress Questionnaire    Feeling of Stress : Rather much  Social Connections: Moderately Integrated (05/03/2021)   Social Connection and Isolation Panel [NHANES]    Frequency of  Communication with Friends and Family: More than three times a week    Frequency of Social Gatherings with Friends and Family: More than three times a week    Attends Religious Services: More than 4 times per year    Active Member of [employer] or Organizations: Yes    Attends Engineer, structural: More than 4 times per year    Marital Status: Divorced   Additional Social History:    Allergies:   Allergies  Allergen Reactions   Nsaids Anaphylaxis and Shortness Of Breath   Shellfish Allergy Anaphylaxis   Abilify [Aripiprazole] Other (See Comments)    hallucinations   Amlodipine     swelling   Amoxicillin Hives   Fish Allergy Other (See Comments)    Reaction unknown   Latuda [Lurasidone Hcl] Other (See Comments)    Uncontrollable shaking, hopelessness.   Levaquin [Levofloxacin In D5w] Other (See Comments)    Reaction unknown   Other Other (See Comments)    Reaction unknown to nuts   Penicillins Other (See Comments)    Reaction unknown Has patient had a PCN reaction causing immediate rash, facial/tongue/throat swelling, SOB or lightheadedness with hypotension: Yes Has patient had a PCN reaction causing severe rash involving mucus membranes or skin necrosis: No Has patient had a PCN reaction that required hospitalization: No Has patient had a PCN reaction occurring within the last 10 years: No If all of the above answers are "NO", then may proceed with Cephalosporin use.   Prednisone Other (See Comments)    Pruritus/ hives   Seroquel [Quetiapine Fumarate] Other (See Comments)    Patient states it made her not care about the world and she just sat down and didn't get up.   Ativan [Lorazepam] Anxiety    Shakiness   Sulfa Antibiotics Rash   Xanax [Alprazolam] Anxiety    Makes pt. anxious    Labs:  Results for orders placed or performed during the hospital encounter of 12/30/21 (from the past 48 hour(s))  Comprehensive metabolic panel     Status: Abnormal   Collection  Time: 12/30/21  9:32 PM  Result Value Ref Range   Sodium 139 135 - 145 mmol/L  Potassium 3.7 3.5 - 5.1 mmol/L   Chloride 108 98 - 111 mmol/L   CO2 23 22 - 32 mmol/L   Glucose, Bld 153 (H) 70 - 99 mg/dL    Comment: Glucose reference range applies only to samples taken after fasting for at least 8 hours.   BUN 12 6 - 20 mg/dL   Creatinine, Ser 6.37 0.44 - 1.00 mg/dL   Calcium 9.2 8.9 - 85.8 mg/dL   Total Protein 7.9 6.5 - 8.1 g/dL   Albumin 4.2 3.5 - 5.0 g/dL   AST 17 15 - 41 U/L   ALT 22 0 - 44 U/L   Alkaline Phosphatase 133 (H) 38 - 126 U/L   Total Bilirubin 0.3 0.3 - 1.2 mg/dL   GFR, Estimated >85 >02 mL/min    Comment: (NOTE) Calculated using the CKD-EPI Creatinine Equation (2021)    Anion gap 8 5 - 15    Comment: Performed at [facility], [address] [place], [place] [address]  Ethanol     Status: None   Collection Time: 12/30/21  9:32 PM  Result Value Ref Range   Alcohol, Ethyl (B) <10 <10 mg/dL    Comment: (NOTE) Lowest detectable limit for serum alcohol is 10 mg/dL.  For medical purposes only. Performed at [facility], [address], [place], [place] [address]   Salicylate level     Status: Abnormal   Collection Time: 12/30/21  9:32 PM  Result Value Ref Range   Salicylate Lvl <7.0 (L) 7.0 - 30.0 mg/dL    Comment: Performed at [facility], [address], [place], [place] [address]  Acetaminophen level     Status: Abnormal   Collection Time: 12/30/21  9:32 PM  Result Value Ref Range   Acetaminophen (Tylenol), Serum <10 (L) 10 - 30 ug/mL    Comment: (NOTE) Therapeutic concentrations vary significantly. A range of 10-30 ug/mL  may be an effective concentration for many patients. However, some  are best treated at concentrations outside of this range. Acetaminophen concentrations >150 ug/mL at 4 hours after ingestion  and >50 ug/mL at 12 hours after ingestion are often associated with  toxic reactions.  Performed at [facility], [address],  [place], [place] [address]   cbc     Status: Abnormal   Collection Time: 12/30/21  9:32 PM  Result Value Ref Range   WBC 9.3 4.0 - 10.5 K/uL   RBC 4.79 3.87 - 5.11 MIL/uL   Hemoglobin 13.3 12.0 - 15.0 g/dL   HCT 28.3 66.2 - 94.7 %   MCV 83.7 80.0 - 100.0 fL   MCH 27.8 26.0 - 34.0 pg   MCHC 33.2 30.0 - 36.0 g/dL   RDW 65.4 65.0 - 35.4 %   Platelets 408 (H) 150 - 400 K/uL   nRBC 0.0 0.0 - 0.2 %    Comment: Performed at [facility], [address], [place], [place] [address]  Rapid urine drug screen (hospital performed)     Status: None   Collection Time: 12/31/21 12:40 AM  Result Value Ref Range   Opiates NONE DETECTED NONE DETECTED   Cocaine NONE DETECTED NONE DETECTED   Benzodiazepines NONE DETECTED NONE DETECTED   Amphetamines NONE DETECTED NONE DETECTED   Tetrahydrocannabinol NONE DETECTED NONE DETECTED   Barbiturates NONE DETECTED NONE DETECTED    Comment: (NOTE) DRUG SCREEN FOR MEDICAL PURPOSES ONLY.  IF CONFIRMATION IS NEEDED FOR ANY PURPOSE, NOTIFY LAB WITHIN 5 DAYS.  LOWEST DETECTABLE LIMITS FOR URINE DRUG  SCREEN Drug Class                     Cutoff (ng/mL) Amphetamine and metabolites    1000 Barbiturate and metabolites    200 Benzodiazepine                 200 Opiates and metabolites        300 Cocaine and metabolites        300 THC                            50 Performed at [facility], [address], [place], [place] [address]   POC urine preg, ED     Status: None   Collection Time: 12/31/21 12:43 AM  Result Value Ref Range   Preg Test, Ur NEGATIVE NEGATIVE    Comment:        THE SENSITIVITY OF THIS METHODOLOGY IS >24 mIU/mL     Medications:  Current Facility-Administered Medications  Medication Dose Route Frequency Provider Last Rate Last Admin   albuterol (PROVENTIL) (2.5 MG/3ML) 0.083% nebulizer solution 2.5 mg  2.5 mg Nebulization Q4H PRN Tanda Rockers A, DO       diltiazem (CARDIZEM CD) 24 hr capsule 120 mg  120 mg Oral Daily Tanda Rockers A, DO   120  mg at 01/01/22 1126   hydrOXYzine (ATARAX) tablet 10 mg  10 mg Oral Daily PRN Tanda Rockers A, DO       ipratropium-albuterol (DUONEB) 0.5-2.5 (3) MG/3ML nebulizer solution 3 mL  3 mL Nebulization Q6H PRN Tanda Rockers A, DO       irbesartan (AVAPRO) tablet 300 mg  300 mg Oral Daily Tanda Rockers A, DO   300 mg at 01/01/22 1126   loratadine (CLARITIN) tablet 10 mg  10 mg Oral Daily Tanda Rockers A, DO   10 mg at 01/01/22 1125   mirtazapine (REMERON) tablet 15 mg  15 mg Oral QHS Tanda Rockers A, DO   15 mg at 12/31/21 2243   mometasone-formoterol (DULERA) 200-5 MCG/ACT inhaler 2 puff  2 puff Inhalation BID PRN Tanda Rockers A, DO       venlafaxine XR (EFFEXOR-XR) 24 hr capsule 225 mg  225 mg Oral Q breakfast Tanda Rockers A, DO   225 mg at 01/01/22 1126   Current Outpatient Medications  Medication Sig Dispense Refill   albuterol (VENTOLIN HFA) 108 (90 Base) MCG/ACT inhaler INHALE 2 PUFFS BY MOUTH EVERY 4 HOURS AS NEEDED FOR WHEEZING AND FOR SHORTNESS OF BREATH (Patient taking differently: Inhale 2 puffs into the lungs every 4 (four) hours as needed for wheezing or shortness of breath.) 18 g 12   budesonide (PULMICORT) 0.5 MG/2ML nebulizer solution Take 2 mLs (0.5 mg total) by nebulization daily. (Patient taking differently: Take 0.5 mg by nebulization daily as needed (shortness of breath (winter months)).) 60 mL 5   cetirizine (ZYRTEC) 10 MG chewable tablet Chew 10 mg by mouth daily.     diltiazem (CARDIZEM CD) 120 MG 24 hr capsule Take 1 capsule (120 mg total) by mouth daily. 30 capsule 5   EPINEPHrine 0.3 mg/0.3 mL IJ SOAJ injection Inject 0.3 mg into the muscle as needed for anaphylaxis. 1 each 5   fluticasone-salmeterol (WIXELA INHUB) 250-50 MCG/ACT AEPB Inhale 1 puff then rinse mouth, twice daily 60 each 12   hydrOXYzine (ATARAX) 10 MG tablet Take 1 tablet (10 mg total) by mouth daily as needed for  anxiety. 90 tablet 0   ipratropium (ATROVENT) 0.03 % nasal spray Place 2 sprays into the nose 2 (two)  times daily. 2 sprays each nostril twice daily 30 mL 5   ipratropium-albuterol (DUONEB) 0.5-2.5 (3) MG/3ML SOLN Take 3 mLs by nebulization every 6 (six) hours as needed (shortness of breath/wheezing.). 360 mL 5   irbesartan (AVAPRO) 300 MG tablet Take 1 tablet (300 mg total) by mouth daily. 30 tablet 5   omeprazole (PRILOSEC) 20 MG capsule Take 20 mg by mouth daily.     venlafaxine XR (EFFEXOR-XR) 75 MG 24 hr capsule Take 3 capsules (225 mg total) by mouth daily with breakfast. (Patient taking differently: Take 225 mg by mouth every evening.) 270 capsule 0   fosfomycin (MONUROL) 3 g PACK SMARTSIG:3 Gram(s) By Mouth Once (Patient not taking: Reported on 12/31/2021)     mirtazapine (REMERON) 15 MG tablet Take 1 tablet (15 mg total) by mouth at bedtime. 15 tablet 0    Musculoskeletal: Strength & Muscle Tone: within normal limits Gait & Station: normal Patient leans: N/A  Psychiatric Specialty Exam:  Presentation  General Appearance:  Appropriate for Environment; Casual; Fairly Groomed  Eye Contact: Good  Speech: Clear and Coherent  Speech Volume: Normal  Handedness: Right  Mood and Affect  Mood: Euthymic  Affect: Appropriate; Congruent  Thought Process  Thought Processes: Coherent  Descriptions of Associations:Intact  Orientation:Full (Time, Place and Person)  Thought Content:Logical; WDL  History of Schizophrenia/Schizoaffective disorder:No  Duration of Psychotic Symptoms:No data recorded Hallucinations:Hallucinations: None  Ideas of Reference:None  Suicidal Thoughts:Suicidal Thoughts: No  Homicidal Thoughts:Homicidal Thoughts: No  Sensorium  Memory: Immediate Good; Recent Good; Remote Good  Judgment: Good  Insight: Good  Executive Functions  Concentration: Good  Attention Span: Good  Recall: Good  Fund of Knowledge: Good  Language: Good  Psychomotor Activity  Psychomotor Activity: Psychomotor Activity: Normal  Assets   Assets: Communication Skills; Desire for Improvement; Housing; Physical Health  Sleep  Sleep: Sleep: Good Number of Hours of Sleep: 9  Physical Exam: Physical Exam Vitals and nursing note reviewed.    Review of Systems  Constitutional: Negative.   HENT: Negative.    Eyes: Negative.   Respiratory: Negative.    Cardiovascular: Negative.        Blood pressure 154/104, pulse 70.  Patient continues on her blood pressure medication as scheduled.  Nursing staff to recheck vital signs.   Gastrointestinal: Negative.   Skin: Negative.   Neurological: Negative.   Psychiatric/Behavioral:  Positive for depression and suicidal ideas. The patient is nervous/anxious.    Blood pressure (!) 154/104, pulse 70, temperature 98.1 F (36.7 C), temperature source Oral, resp. rate 18, height  (1.626 m), weight 78 kg, SpO2 100 %. Body mass index is 29.52 kg/m.  Treatment Plan Summary: Daily contact with patient to assess and evaluate symptoms and progress in treatment and Medication management  Disposition: No evidence of imminent risk to self or others at present.  Patient is discharged to home to follow-up with outpatient psychiatric services.  This service was provided via telemedicine using a 2-way, interactive audio and video technology.  Names of all persons participating in this telemedicine service and their role in this encounter. Name: Nancy Barnes Role: Patient  Name: Alan Mulder Role: NP provider  Name: Dr. Sherron Flemings Role: [facility] medical Director  Name: Dr. Judd Lien Role: [facility], ED physician    Cecilie Lowers, FNP 01/01/2022 11:30 AM

## 2022-01-01 NOTE — ED Provider Notes (Signed)
Emergency Medicine Observation Re-evaluation Note  Nancy Barnes is a 51 y.o. female, seen on rounds today.  Pt initially presented to the ED for complaints of Medical Clearance, Depression, and Suicidal Currently, the patient is sleeping in NAD.  Physical Exam  BP (!) 154/104 (BP Location: Left Arm)   Pulse 70   Temp 98.1 F (36.7 C) (Oral)   Resp 18   Ht 5\' 4"  (1.626 m)   Wt 78 kg   SpO2 100%   BMI 29.52 kg/m  Physical Exam General: Appears to be resting comfortably in bed, no acute distress. Cardiac: Regular rate, normal heart rate, non-emergent blood pressure for this morning's vitals. Lungs: No increased work of breathing.  Equal chest rise appreciated Psych: Calm, asleep in bed.   ED Course / MDM  EKG:   I have reviewed the labs performed to date as well as medications administered while in observation.   Plan  Current plan is for psychiatric reconsultation.  On chart review, she has been assessed by [facility] yesterday was deemed stable for outpatient care and management.  However at time of discharge Nancy Barnes expressed that she did not feel safe.  Pending repeat psychiatric evaluation today after further overnight observation.    [facility], MD 01/01/22 [phone]

## 2022-01-01 NOTE — Discharge Instructions (Signed)
Follow as instructed by behavior health

## 2022-01-02 ENCOUNTER — Telehealth (HOSPITAL_BASED_OUTPATIENT_CLINIC_OR_DEPARTMENT_OTHER): Payer: Self-pay | Admitting: Psychiatry

## 2022-01-02 DIAGNOSIS — F332 Major depressive disorder, recurrent severe without psychotic features: Secondary | ICD-10-CM

## 2022-01-02 DIAGNOSIS — F99 Mental disorder, not otherwise specified: Secondary | ICD-10-CM

## 2022-01-02 DIAGNOSIS — F411 Generalized anxiety disorder: Secondary | ICD-10-CM

## 2022-01-02 DIAGNOSIS — F5105 Insomnia due to other mental disorder: Secondary | ICD-10-CM

## 2022-01-02 MED ORDER — MIRTAZAPINE 15 MG PO TABS: 15.0000 mg | ORAL_TABLET | Freq: Every day | ORAL | 0 refills | Status: DC
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
## 2022-01-21 DIAGNOSIS — G4733 Obstructive sleep apnea (adult) (pediatric): Secondary | ICD-10-CM | POA: Diagnosis not present

## 2022-01-28 ENCOUNTER — Other Ambulatory Visit: Payer: Self-pay | Admitting: *Deleted

## 2022-01-29 MED ORDER — DILTIAZEM HCL ER COATED BEADS 120 MG PO CP24: 120.0000 mg | ORAL_CAPSULE | Freq: Every day | ORAL | 5 refills | Status: DC

## 2022-01-30 ENCOUNTER — Telehealth (HOSPITAL_BASED_OUTPATIENT_CLINIC_OR_DEPARTMENT_OTHER): Payer: Medicaid Other | Admitting: Psychiatry

## 2022-01-30 ENCOUNTER — Telehealth (HOSPITAL_COMMUNITY): Payer: Self-pay | Admitting: Psychiatry

## 2022-01-30 DIAGNOSIS — F411 Generalized anxiety disorder: Secondary | ICD-10-CM | POA: Diagnosis not present

## 2022-01-30 DIAGNOSIS — F5105 Insomnia due to other mental disorder: Secondary | ICD-10-CM | POA: Diagnosis not present

## 2022-01-30 DIAGNOSIS — F902 Attention-deficit hyperactivity disorder, combined type: Secondary | ICD-10-CM

## 2022-01-30 DIAGNOSIS — F332 Major depressive disorder, recurrent severe without psychotic features: Secondary | ICD-10-CM | POA: Diagnosis not present

## 2022-01-30 DIAGNOSIS — F99 Mental disorder, not otherwise specified: Secondary | ICD-10-CM | POA: Diagnosis not present

## 2022-01-30 NOTE — Progress Notes (Signed)
Virtual Visit via Video Note  I connected with Nancy Barnes on 01/30/22 at  1:45 PM EST by  a video enabled telemedicine application and verified that I am speaking with the correct person using two identifiers.  Location: Patient: in parked car Provider: office   I discussed the limitations of evaluation and management by telemedicine and the availability of in person appointments. The patient expressed understanding and agreed to proceed.  History of Present Illness: "I'm good. I am doing good". Nancy Barnes shares she is sleeping better and is starting a new job in 3 days. 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 Nancy Barnes states she has not felt anxious in a while and can't even recall when she was last anxious. Her depression has improved. She has been active and motivated. Anhedonia has resolved. Nancy Barnes denies isolation. She denies SI/HI. Her ADHD symptoms are ongoing and she doesn't expect it to change without meds.  Nancy Barnes feels the Remeron has been very effective and denies SE. Nancy Barnes rarely takes Vistaril and last filled it 2 years ago.   Observations/Objective: Psychiatric Specialty Exam: ROS  There were no vitals taken for this visit.There is no height or weight on file to calculate BMI.  General Appearance: Fairly Groomed  Eye Contact:  Good  Speech:  Clear and Coherent and Normal Rate  Volume:  Normal  Mood:  Euthymic  Affect:  Full Range  Thought Process:  Goal Directed, Linear, and Descriptions of Associations: Intact  Orientation:  Full (Time, Place, and Person)  Thought Content:  Logical  Suicidal Thoughts:  No  Homicidal Thoughts:  No  Memory:  Immediate;   Good  Judgement:  Good  Insight:  Good  Psychomotor Activity:  Normal  Concentration:  Concentration: Good  Recall:  Good  Fund of Knowledge:  Good  Language:  Good  Akathisia:  No  Handed:  Right  AIMS (if indicated):     Assets:  Communication Skills Desire for Improvement Financial  Resources/Insurance Housing Leisure Time Resilience Social Support Talents/Skills Transportation Vocational/Educational  ADL's:  Intact  Cognition:  WNL  Sleep:        Assessment and Plan:     01/30/2022    1:54 PM 12/19/2021    1:17 PM 09/26/2021    3:50 PM 09/13/2021    3:04 PM 07/18/2021    3:19 PM  Depression screen PHQ 2/9  Decreased Interest 0 0 0 0 0  Down, Depressed, Hopeless 0 2 0 0 1  PHQ - 2 Score 0 2 0 0 1  Altered sleeping 0 3 0 2   Tired, decreased energy 1 3 3 3    Change in appetite 0 3 0 1   Feeling bad or failure about yourself  2 3 3 2    Trouble concentrating 3 3 3  0   Moving slowly or fidgety/restless 0 0 0 0   Suicidal thoughts 0 0 0 0   PHQ-9 Score 6 17 9 8    Difficult doing work/chores Not difficult at all Very difficult  Somewhat difficult     Flowsheet Row Video Visit from 01/30/2022 in [facility] Video Visit from 01/02/2022 in [facility] ED from 12/30/2021 in [place] No Risk No Risk No Risk          Pt is aware that these meds carry a teratogenic risk. Pt will discuss plan of action if she does or plans to become pregnant in the future.  Status of current problems: stable   Medication management with supportive therapy. Risks and benefits, side effects and alternative treatment options discussed with patient. Pt was given an opportunity to ask questions about medication, illness, and treatment. All current psychiatric medications have been reviewed and discussed with the patient and adjusted as clinically appropriate.  Pt verbalized understanding and verbal consent obtained for treatment.  Meds: no refills today Continue Remeron 15mg  po qHS Continue Effexor XR 225mg  po qD 1. GAD (generalized anxiety disorder)  2. Severe episode of recurrent major depressive disorder, without psychotic features ([place])  3. Insomnia due  to other mental disorder  4. Attention deficit hyperactivity disorder (ADHD), combined type  ADHD symptoms are self managed by coping skills.    Labs: none    Therapy: brief supportive therapy provided. Discussed psychosocial stressors in detail.   Collaboration of Care: Other none  Patient/Guardian was advised Release of Information must be obtained prior to any record release in order to collaborate their care with an outside provider. Patient/Guardian was advised if they have not already done so to contact the registration department to sign all necessary forms in order for [place] to release information regarding their care.   Consent: Patient/Guardian gives verbal consent for treatment and assignment of benefits for services provided during this visit. Patient/Guardian expressed understanding and agreed to proceed.      Follow Up Instructions: Follow up in 2-3 months or sooner if needed    I discussed the assessment and treatment plan with the patient. The patient was provided an opportunity to ask questions and all were answered. The patient agreed with the plan and demonstrated an understanding of the instructions.   The patient was advised to call back or seek an in-person evaluation if the symptoms worsen or if the condition fails to improve as anticipated.  I provided 11 minutes of non-face-to-face time during this encounter.   Charlcie Cradle, MD

## 2022-01-30 NOTE — Telephone Encounter (Signed)
Patient was not present on video platform used through mychart. I called the patient at our scheduled appointment time. There was no answer. I left a voice message for patient to call the clinic back at their convinence. There was no return phone call during out scheduled visit time. I was not able to speak with the patient today, as they were a no show for their scheduled appointment.   

## 2022-02-08 ENCOUNTER — Encounter (HOSPITAL_COMMUNITY): Payer: Self-pay | Admitting: *Deleted

## 2022-02-08 ENCOUNTER — Emergency Department (HOSPITAL_COMMUNITY): Payer: Medicaid Other

## 2022-02-08 ENCOUNTER — Other Ambulatory Visit: Payer: Self-pay

## 2022-02-08 ENCOUNTER — Emergency Department (HOSPITAL_COMMUNITY)
Admission: EM | Admit: 2022-02-08 | Discharge: 2022-02-08 | Disposition: A | Discharge status: Home / Self Care | Payer: Medicaid Other | Attending: Emergency Medicine | Admitting: Emergency Medicine

## 2022-02-08 DIAGNOSIS — Z79899 Other long term (current) drug therapy: Secondary | ICD-10-CM | POA: Diagnosis not present

## 2022-02-08 DIAGNOSIS — M79605 Pain in left leg: Secondary | ICD-10-CM

## 2022-02-08 DIAGNOSIS — Z7901 Long term (current) use of anticoagulants: Secondary | ICD-10-CM | POA: Insufficient documentation

## 2022-02-08 DIAGNOSIS — M7989 Other specified soft tissue disorders: Secondary | ICD-10-CM | POA: Diagnosis not present

## 2022-02-08 DIAGNOSIS — M79662 Pain in left lower leg: Secondary | ICD-10-CM | POA: Diagnosis not present

## 2022-02-08 DIAGNOSIS — I10 Essential (primary) hypertension: Secondary | ICD-10-CM | POA: Diagnosis not present

## 2022-02-08 DIAGNOSIS — I8002 Phlebitis and thrombophlebitis of superficial vessels of left lower extremity: Secondary | ICD-10-CM | POA: Diagnosis not present

## 2022-02-08 LAB — CBC
HCT: 38.2 % (ref 36.0–46.0)
Hemoglobin: 12.3 g/dL (ref 12.0–15.0)
MCH: 27.7 pg (ref 26.0–34.0)
MCHC: 32.2 g/dL (ref 30.0–36.0)
MCV: 86 fL (ref 80.0–100.0)
Platelets: 318 10*3/uL (ref 150–400)
RBC: 4.44 MIL/uL (ref 3.87–5.11)
RDW: 13.2 % (ref 11.5–15.5)
WBC: 5.8 10*3/uL (ref 4.0–10.5)
nRBC: 0 % (ref 0.0–0.2)

## 2022-02-08 LAB — BASIC METABOLIC PANEL
Anion gap: 9 (ref 5–15)
BUN: 11 mg/dL (ref 6–20)
CO2: 27 mmol/L (ref 22–32)
Calcium: 8.8 mg/dL — ABNORMAL LOW (ref 8.9–10.3)
Chloride: 103 mmol/L (ref 98–111)
Creatinine, Ser: 0.74 mg/dL (ref 0.44–1.00)
GFR, Estimated: >60 mL/min (ref >60)
Glucose, Bld: 111 mg/dL — ABNORMAL HIGH (ref 70–99)
Potassium: 3.3 mmol/L — ABNORMAL LOW (ref 3.5–5.1)
Sodium: 139 mmol/L (ref 135–145)

## 2022-02-08 MED ORDER — APIXABAN (ELIQUIS) VTE STARTER PACK (10MG AND 5MG): ORAL_TABLET | ORAL | 0 refills | Status: DC

## 2022-02-08 MED ORDER — DOXYCYCLINE HYCLATE 100 MG PO CAPS: 100.0000 mg | ORAL_CAPSULE | Freq: Two times a day (BID) | ORAL | 0 refills | Status: AC

## 2022-02-08 NOTE — ED Provider Notes (Signed)
[place] EMERGENCY DEPARTMENT AT [facility] Provider Note   CSN: [id number] Arrival date & time: 02/08/22  0920     History  Chief Complaint  Patient presents with   Leg Swelling    Nancy Barnes is a 52 y.o. female.  HPI   This patient is a 52 year old female, she has a history of mild hypertension on irbesartan and diltiazem, presents to the hospital after having left-sided leg pain and swelling.  She noticed a very specific local [place] on the anteromedial aspect of her left calf, this started within the last day or 2, she noted that this morning her leg was much more swollen than the right side but the swelling is gradually improved.  She has never had a blood clot though she does recall having phlebitis after of IV placement in the past, there is no history of DVT in the family, she has never had a history of cancer and has not had any recent vascular damage injury trauma surgical procedures travel immobilization or hormone use.  She denies tobacco use, denies any fevers or chills, she does note that the area was red and hard this morning  Home Medications Prior to Admission medications   Medication Sig Start Date End Date Taking? Authorizing Provider  APIXABAN (ELIQUIS) VTE STARTER PACK (10MG  AND 5MG ) Take as directed on package: start with two-5mg  tablets twice daily for 7 days. On day 8, switch to one-5mg  tablet twice daily. 02/08/22  Yes , MD  doxycycline (VIBRAMYCIN) 100 MG capsule Take 1 capsule (100 mg total) by mouth 2 (two) times daily for 7 days. 02/08/22 02/15/22 Yes 02/10/22, MD  albuterol (VENTOLIN HFA) 108 (90 Base) MCG/ACT inhaler INHALE 2 PUFFS BY MOUTH EVERY 4 HOURS AS NEEDED FOR WHEEZING AND FOR SHORTNESS OF BREATH Patient taking differently: Inhale 2 puffs into the lungs every 4 (four) hours as needed for wheezing or shortness of breath. 03/25/21   Eber Hong D, MD  budesonide (PULMICORT) 0.5 MG/2ML nebulizer solution Take 2 mLs  (0.5 mg total) by nebulization daily. Patient taking differently: Take 0.5 mg by nebulization daily as needed (shortness of breath (winter months)). 12/12/19   Jetty Duhamel, MD  cetirizine (ZYRTEC) 10 MG chewable tablet Chew 10 mg by mouth daily.    [provider]  diltiazem (CARDIZEM CD) 120 MG 24 hr capsule Take 1 capsule (120 mg total) by mouth daily. 01/29/22   Wynn Banker, MD  EPINEPHrine 0.3 mg/0.3 mL IJ SOAJ injection Inject 0.3 mg into the muscle as needed for anaphylaxis. Patient not taking: Reported on 01/30/2022 03/25/21   02/01/2022, MD  fluticasone-salmeterol [facility] INHUB) 250-50 MCG/ACT AEPB Inhale 1 puff then rinse mouth, twice daily 03/25/21   [place] D, MD  hydrOXYzine (ATARAX) 10 MG tablet Take 1 tablet (10 mg total) by mouth daily as needed for anxiety. 05/02/21   Jetty Duhamel, MD  ipratropium (ATROVENT) 0.03 % nasal spray Place 2 sprays into the nose 2 (two) times daily. 2 sprays each nostril twice daily 12/12/19   Koberlein, [name] C, MD  ipratropium-albuterol (DUONEB) 0.5-2.5 (3) MG/3ML SOLN Take 3 mLs by nebulization every 6 (six) hours as needed (shortness of breath/wheezing.). 12/12/19   Koberlein, Jannette Spanner, MD  irbesartan (AVAPRO) 300 MG tablet Take 1 tablet (300 mg total) by mouth daily. 09/13/21   Paris Lore, MD  mirtazapine (REMERON) 15 MG tablet Take 1 tablet (15 mg total) by mouth at bedtime. 01/02/22 01/02/23  Charlcie Cradle, MD  omeprazole (PRILOSEC) 20 MG capsule Take 20 mg by mouth daily.    [provider]  venlafaxine XR (EFFEXOR-XR) 75 MG 24 hr capsule Take 3 capsules (225 mg total) by mouth daily with breakfast. Patient taking differently: Take 225 mg by mouth every evening. 12/19/21   Charlcie Cradle, MD      Allergies    Nsaids, Shellfish allergy, Abilify [aripiprazole], Amlodipine, Amoxicillin, Fish allergy, Latuda [lurasidone hcl], Levaquin [levofloxacin in d5w], Other, Penicillins, Prednisone, Seroquel  [quetiapine fumarate], Ativan [lorazepam], Sulfa antibiotics, and Xanax [alprazolam]    Review of Systems   Review of Systems  All other systems reviewed and are negative.   Physical Exam Updated Vital Signs BP (!) 146/102 (BP Location: Left Arm)   Pulse 81   Temp 98.7 F (37.1 C) (Oral)   Resp 14   Ht 1.638 m (5' 4.5")   Wt 77.1 kg   SpO2 100%   BMI 28.73 kg/m  Physical Exam Vitals and nursing note reviewed.  Constitutional:      General: She is not in acute distress.    Appearance: She is well-developed.  HENT:     Head: Normocephalic and atraumatic.     Mouth/Throat:     Pharynx: No oropharyngeal exudate.  Eyes:     General: No scleral icterus.       Right eye: No discharge.        Left eye: No discharge.     Conjunctiva/sclera: Conjunctivae normal.     Pupils: Pupils are equal, round, and reactive to light.  Neck:     Thyroid: No thyromegaly.     Vascular: No JVD.  Cardiovascular:     Rate and Rhythm: Normal rate and regular rhythm.     Heart sounds: Normal heart sounds. No murmur heard.    No friction rub. No gallop.  Pulmonary:     Effort: Pulmonary effort is normal. No respiratory distress.     Breath sounds: Normal breath sounds. No wheezing or rales.  Abdominal:     General: Bowel sounds are normal. There is no distension.     Palpations: Abdomen is soft. There is no mass.     Tenderness: There is no abdominal tenderness.  Musculoskeletal:        General: Tenderness present. Normal range of motion.     Cervical back: Normal range of motion and neck supple.     Comments: The legs are symmetrical without any pitting edema, the pulses are normal and there is good capillary refill bilaterally.  There is an area of about 2 cm in diameter that is tender and nodular on the left anteromedial calf, there is no signs of lymphangitis or swelling or redness or streaking and no tenderness in the left inguinal region or lymphadenopathy in that region  Lymphadenopathy:      Cervical: No cervical adenopathy.  Skin:    General: Skin is warm and dry.     Findings: Erythema present. No rash.  Neurological:     Mental Status: She is alert.     Coordination: Coordination normal.  Psychiatric:        Behavior: Behavior normal.     ED Results / Procedures / Treatments   Labs (all labs ordered are listed, but only abnormal results are displayed) Labs Reviewed  BASIC METABOLIC PANEL - Abnormal; Notable for the following components:      Result Value   Potassium 3.3 (*)    Glucose, Bld 111 (*)  Calcium 8.8 (*)    All other components within normal limits  CBC    EKG None  Radiology US Venous Img Lower Unilateral Left (DVT)  Result Date: 02/08/2022 CLINICAL DATA:  Pain and swelling EXAM: LEFT LOWER EXTREMITY VENOUS DOPPLER ULTRASOUND TECHNIQUE: Gray-scale sonography with compression, as well as color and duplex ultrasound, were performed to evaluate the deep venous system(s) from the level of the common femoral vein through the popliteal and proximal calf veins. COMPARISON:  None Available. FINDINGS: VENOUS Normal compressibility of the common femoral, superficial femoral, and popliteal veins, as well as the visualized calf veins. Visualized portions of profunda femoral vein and great saphenous vein unremarkable. No filling defects to suggest DVT on grayscale or color Doppler imaging. Doppler waveforms show normal direction of venous flow, normal respiratory plasticity and response to augmentation. Limited views of the contralateral common femoral vein are unremarkable. OTHER Possible nonocclusive thrombus in a superficial vein at the site of the patient's pain. Limitations: none IMPRESSION: 1. No DVT. 2. Possible nonocclusive thrombus in a superficial vein at the site of the patient's pain. Electronically Signed   By: Gerome Sam III M.D.   On: 02/08/2022 10:49    Procedures Ultrasound ED DVT  Date/Time: 02/08/2022 9:54 AM  Performed by: Eber Hong, MD Authorized by: Eber Hong, MD   Procedure details:    Indications: lower extremity pain     Assessment for:  DVT   Images Archived: Yes     Limitations:  Positioning, patient compliance and body habitus LLE Findings:    Left common femoral vein:  Compressible   Left deep and superficial femoral veins:  Compressible   Left popliteal vein:  Compressible IMPRESSION:   DVT:     None Comments:           Medications Ordered in ED Medications - No data to display  ED Course/ Medical Decision Making/ A&P                             Medical Decision Making Amount and/or Complexity of Data Reviewed Labs: ordered.  Risk Prescription drug management.   This patient presents to the ED for concern of left calf pain, this involves an extensive number of treatment options, and is a complaint that carries with it a high risk of complications and morbidity.  The differential diagnosis includes DVT given the leg swelling, would also consider cellulitis, would also consider possible lymphadenopathy, there is no signs of lymphangitis or necrotizing fasciitis   Co morbidities that complicate the patient evaluation  Hypertension   Additional history obtained:  Additional history obtained from electronic medical record External records from outside source obtained and reviewed including multiple prior ED visits within the last year for minor issues, she follows with psychiatry because of generalized anxiety disorder, no recent admissions to the hospital   Lab Tests:  I Ordered, and personally interpreted labs.  The pertinent results include:  K of 3.3 -BC without anemia, renal function preserved   Imaging Studies ordered:  I ordered imaging studies including bedside ultrasound which reveals no signs of deep venous thrombosis I independently visualized and interpreted imaging which showed no signs of DVT, see procedure note I agree with the radiologist  interpretation   Cardiac Monitoring: / EKG:  The patient was maintained on a cardiac monitor.  I personally viewed and interpreted the cardiac monitored which showed an underlying rhythm of: Sinus rhythm  Problem List / ED Course / Critical interventions / Medication management  Ultrasound shows superficial possible clot at that area of tenderness, will treat with 1 month of Eliquis, family doctor can arrange follow-up and need for ongoing medicines I ordered medication including Eliquis, doxycycline   Reevaluation of the patient after these medicines showed that the patient improved I have reviewed the patients home medicines and have made adjustments as needed   Social Determinants of Health:  None   Test / Admission - Considered:  Considered admission but the patient is low risk for severe DVT, no signs of phlegmasia Patient updated on results, stable for discharge         Final Clinical Impression(s) / ED Diagnoses Final diagnoses:  Leg pain, anterior, left  Thrombophlebitis of superficial veins of left lower extremity    Rx / DC Orders ED Discharge Orders          Ordered    APIXABAN (ELIQUIS) VTE STARTER PACK (10MG  AND 5MG )        02/08/22 1057    doxycycline (VIBRAMYCIN) 100 MG capsule  2 times daily        02/08/22 1057              Noemi Chapel, MD 02/08/22 1059

## 2022-02-08 NOTE — ED Triage Notes (Signed)
Pt c/o swelling and pain to left lower leg next to her shin. Pt reports a "knot" came up a few days ago and denies any injury. 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 She elevated it and took Tylenol last night without any change in the swelling.

## 2022-02-08 NOTE — Discharge Instructions (Signed)
It would be best if you follow-up for an ultrasound with your doctor within the next couple of weeks.  The ultrasound here thought that you had a very superficial area that could have a small blood clot, there is no signs of deep blood clot.  In the meantime I want you to take the following medications  Doxycycline twice a day for 7 days to treat for potential infection  Eliquis, this is a blood thinner, I would only take 1 month of this and have your family doctor reevaluate to see if you need more.  I would also recommend that you should take medications such as Tylenol or ibuprofen if you are not allergic and use warm compresses over this area to help ease the pain and swelling  ER for severe worsening symptoms

## 2022-02-11 ENCOUNTER — Other Ambulatory Visit (HOSPITAL_COMMUNITY): Payer: Self-pay

## 2022-02-11 ENCOUNTER — Telehealth: Payer: Self-pay

## 2022-02-11 NOTE — Telephone Encounter (Signed)
PA request received via CMM for Wixela Inhub 250-50MCG/ACT aerosol powder  PA not submitted due to test claim showing that Brand Advair Diskus is preferred by patients current plan.  Key: [id number]

## 2022-02-14 ENCOUNTER — Encounter: Payer: Self-pay | Admitting: Family Medicine

## 2022-02-14 ENCOUNTER — Ambulatory Visit (INDEPENDENT_AMBULATORY_CARE_PROVIDER_SITE_OTHER): Payer: Medicaid Other | Admitting: Family Medicine

## 2022-02-14 VITALS — BP 130/90 | HR 85 | Temp 98.7°F | Ht 64.5 in | Wt 189.9 lb

## 2022-02-14 DIAGNOSIS — R0989 Other specified symptoms and signs involving the circulatory and respiratory systems: Secondary | ICD-10-CM | POA: Diagnosis not present

## 2022-02-14 DIAGNOSIS — I8002 Phlebitis and thrombophlebitis of superficial vessels of left lower extremity: Secondary | ICD-10-CM | POA: Diagnosis not present

## 2022-02-14 DIAGNOSIS — R600 Localized edema: Secondary | ICD-10-CM | POA: Diagnosis not present

## 2022-02-14 NOTE — Progress Notes (Signed)
Established Patient Office Visit  Subjective   Patient ID: Nancy Barnes, female    DOB: November 01, 1970  Age: 52 y.o. MRN: [id number]  Chief Complaint  Patient presents with   Leg Pain    Patient complains of recurrent left lower leg pain, lump present and swelling and states she is concerned with lack of color in nail beds of the left foot   Numbness    Bilateral UE and LE x2 weeks    Pt reports she went to the ER on 02/08/2022. Pt reports she developed a red knot on her left lower leg anteriorly. States that she was placed on doxycycline and eliquis. I have reviewed her venous US and discussed the results with the patient. There is NO DVT, there was a clot in the superficial veins under the spot that was tender and sore. She was given eliquis starter pack and doxycycline 100 mg BID for 7 days. Pt reports that the sore spot is less swollen and red, however she states her left foot is more pale/ colder than her right foot. States that the ER had to use a doppler to palpate her pulses in her foot.     Patient Active Problem List   Diagnosis Date Noted   Postoperative breast asymmetry 01/31/2020   Hypertension 09/02/2019   OSA (obstructive sleep apnea) 06/17/2019   Back pain 04/29/2018   Neck pain 04/29/2018   Symptomatic mammary hypertrophy 04/29/2018   GAD (generalized anxiety disorder) 11/03/2013   Major psychotic depression, recurrent ([place]) 11/03/2013   Seasonal and perennial allergic rhinitis 02/29/2012   Food allergy 02/29/2012   Medication intolerance 02/29/2012   Asthma, moderate persistent 12/23/2011   Depression 12/09/2011   Anxiety 12/09/2011      ROS    Objective:     BP (!) 130/90 (BP Location: Right Arm, Patient Position: Sitting, Cuff Size: Normal)   Pulse 85   Temp 98.7 F (37.1 C) (Oral)   Ht 5' 4.5" (1.638 m)   Wt 189 lb 14.4 oz (86.1 kg)   SpO2 98%   BMI 32.09 kg/m  BP Readings from Last 3 Encounters:  02/14/22 (!) 130/90  02/08/22 (!) 131/97   01/01/22 (!) 181/115      Physical Exam Vitals reviewed.  Constitutional:      Appearance: Normal appearance. She is obese.  Cardiovascular:     Pulses:          Dorsalis pedis pulses are 1+ on the left side.       Posterior tibial pulses are 1+ on the left side.     Comments: Delayed capillary refill on the left foot Musculoskeletal:        General: Swelling (trace pitting edema left lower leg) and tenderness (there is a round area of erythema and tenderness on the left anterior left just distal to the knee) present.  Neurological:     Mental Status: She is alert.      No results found for any visits on 02/14/22.    The 10-year ASCVD risk score (Arnett DK, et al., 2019) is: 2.1%    Assessment & Plan:   Problem List Items Addressed This Visit   None Visit Diagnoses     Thrombophlebitis of superficial veins of left lower extremity    -  Primary   Relevant Orders   The thrombus is superficial, her deep veins were negative for clot. Will discontinue the eliquis, this was explained to the patient in detail. Her pulses in the  left foot are palpable but not strong, her capillary refill is also delayed. I will send her to vascular surgery and order arterial duplex to looks at the arteries.  Ambulatory referral to Vascular Surgery   Mild peripheral edema       Relevant Orders   Ambulatory referral to Vascular Surgery       No follow-ups on file.    Farrel Conners, MD

## 2022-02-20 ENCOUNTER — Other Ambulatory Visit (HOSPITAL_BASED_OUTPATIENT_CLINIC_OR_DEPARTMENT_OTHER): Payer: Self-pay | Admitting: Family Medicine

## 2022-03-07 ENCOUNTER — Ambulatory Visit (INDEPENDENT_AMBULATORY_CARE_PROVIDER_SITE_OTHER): Payer: Medicaid Other

## 2022-03-07 DIAGNOSIS — R0989 Other specified symptoms and signs involving the circulatory and respiratory systems: Secondary | ICD-10-CM

## 2022-03-08 LAB — VAS US ABI WITH/WO TBI
Left ABI: 1.27
Right ABI: 1.28

## 2022-03-09 NOTE — Progress Notes (Unsigned)
[facility] [place]  ASSESSMENT / PLAN: 52 y.o. female with left lower extremity chronic venous insufficiency causing edema.  ABI normal. DVT scan shows possible thrombophlebitis.  Counseled patient about compression / elevation / exercise. Will see her back in three months with reflux scan to evaluate candidacy for saphenous vein ablation.  CHIEF COMPLAINT: left leg swelling  HISTORY OF PRESENT ILLNESS: Nancy Barnes is a 52 y.o. female who reports a history of a blood clot in the left leg and is currently experiencing pain in the same leg. The pain is localized in the thigh and calf areas, without radiation down the leg. The patient does not believe she has sciatica. the sciatic nerve.  The patient experiences significant swelling in the left leg, which progresses over the course of the day and is most noticeable at night and after prolonged periods of standing due to their occupation. They also report discoloration in the affected leg, with the skin appearing red at night.  Past Medical History:  Diagnosis Date   Anxiety    Asthma    Chicken pox    Complication of anesthesia    unable to tolerate NSAIDS   Depression    Environmental allergies    food/medication   Family history of adverse reaction to anesthesia    mother skin glue allergy post surgery    GERD (gastroesophageal reflux disease)    Headache(784.0)    Hypertension    Hypokalemia    Pneumomediastinum (HCC)    Seasonal allergies    Sleep apnea    Suicidal ideations    UTI (urinary tract infection)     Past Surgical History:  Procedure Laterality Date   ABLATION     BREAST BIOPSY  04/2011   BREAST REDUCTION SURGERY Bilateral 09/14/2019   Procedure: BILATERAL BREAST REDUCTION WITH LIPOSUCTION;  Surgeon: Wallace Going, DO;  Location: [facility];  Service: Plastics;  Laterality: Bilateral;  3 hours   TUBAL LIGATION      Family History  Problem Relation Age of  Onset   Heart attack Mother 55       Bypass x5   Allergies Mother    CAD Mother    High blood pressure Mother    High Cholesterol Mother    Heart disease Mother    Arthritis Mother    Heart failure Mother    Diabetes Mellitus II Father    Hypertension Father    Heart disease Father    CAD Father    Other Father        Agent orange exposure   Arthritis Father    Depression Father    Atrial fibrillation Father    Allergies Maternal Grandmother    Atrial fibrillation Maternal Grandmother    Stroke Maternal Grandmother    Asthma Maternal Grandfather    Asthma Paternal Grandmother    Asthma Paternal Grandfather    Allergies Daughter    Psoriasis Daughter    Arthritis Daughter        psoriatic   Hypothyroidism Daughter    Suicidality Neg Hx    Anxiety disorder Neg Hx     Social History   Socioeconomic History   Marital status: Divorced    Spouse name: Not on file   Number of children: Not on file   Years of education: Not on file   Highest education level: Associate degree: occupational, Hotel manager, or vocational program  Occupational History   Occupation: Information systems manager and Sports administrator for  [employer]    Employer: [employer]    Comment: Processer   Occupation: Social research officer, government  Tobacco Use   Smoking status: Never   Smokeless tobacco: Never  Vaping Use   Vaping Use: Never used  Substance and Sexual Activity   Alcohol use: Not Currently    Comment: socially   Drug use: No   Sexual activity: Yes    Partners: Male    Birth control/protection: None, Condom  Other Topics Concern   Not on file  Social History Narrative   Works for [employer]. Lives at home with 2 teenage children. Never smoker.   Social Determinants of Health   Financial Resource Strain: Medium Risk (05/03/2021)   Overall Financial Resource Strain (CARDIA)    Difficulty of Paying Living Expenses: Somewhat hard  Food Insecurity: Unknown (05/03/2021)   Hunger Vital Sign     Worried About Running Out of Food in the Last Year: Patient refused    [place] in the Last Year: Patient refused  Transportation Needs: No Transportation Needs (05/03/2021)   PRAPARE - Hydrologist (Medical): No    Lack of Transportation (Non-Medical): No  Physical Activity: Unknown (05/03/2021)   Exercise Vital Sign    Days of Exercise per Week: 0 days    Minutes of Exercise per Session: Not on file  Stress: Stress Concern Present (05/03/2021)   [place]    Feeling of Stress : Rather much  Social Connections: Moderately Integrated (05/03/2021)   Social Connection and Isolation Panel [NHANES]    Frequency of Communication with Friends and Family: More than three times a week    Frequency of Social Gatherings with Friends and Family: More than three times a week    Attends Religious Services: More than 4 times per year    Active Member of [employer] or Organizations: Yes    Attends Music therapist: More than 4 times per year    Marital Status: Divorced  Human resources officer Violence: Not on file    Allergies  Allergen Reactions   Nsaids Anaphylaxis and Shortness Of Breath   Shellfish Allergy Anaphylaxis   Abilify [Aripiprazole] Other (See Comments)    hallucinations   Amlodipine     swelling   Amoxicillin Hives   Fish Allergy Other (See Comments)    Reaction unknown   Latuda [Lurasidone Hcl] Other (See Comments)    Uncontrollable shaking, hopelessness.   Levaquin [Levofloxacin In D5w] Other (See Comments)    Reaction unknown   Other Other (See Comments)    Reaction unknown to nuts   Penicillins Other (See Comments)    Reaction unknown Has patient had a PCN reaction causing immediate rash, facial/tongue/throat swelling, SOB or lightheadedness with hypotension: Yes Has patient had a PCN reaction causing severe rash involving mucus membranes or skin necrosis: No Has  patient had a PCN reaction that required hospitalization: No Has patient had a PCN reaction occurring within the last 10 years: No If all of the above answers are "NO", then may proceed with Cephalosporin use.   Prednisone Other (See Comments)    Pruritus/ hives   Seroquel [Quetiapine Fumarate] Other (See Comments)    Patient states it made her not care about the world and she just sat down and didn't get up.   Ativan [Lorazepam] Anxiety    Shakiness   Sulfa Antibiotics Rash   Xanax [Alprazolam] Anxiety    Makes  pt. anxious    Current Outpatient Medications  Medication Sig Dispense Refill   albuterol (VENTOLIN HFA) 108 (90 Base) MCG/ACT inhaler INHALE 2 PUFFS BY MOUTH EVERY 4 HOURS AS NEEDED FOR WHEEZING AND FOR SHORTNESS OF BREATH (Patient taking differently: Inhale 2 puffs into the lungs every 4 (four) hours as needed for wheezing or shortness of breath.) 18 g 12   budesonide (PULMICORT) 0.5 MG/2ML nebulizer solution Take 2 mLs (0.5 mg total) by nebulization daily. (Patient taking differently: Take 0.5 mg by nebulization daily as needed (shortness of breath (winter months)).) 60 mL 5   cetirizine (ZYRTEC) 10 MG chewable tablet Chew 10 mg by mouth daily.     diltiazem (CARDIZEM CD) 120 MG 24 hr capsule Take 1 capsule (120 mg total) by mouth daily. 30 capsule 5   EPINEPHrine 0.3 mg/0.3 mL IJ SOAJ injection Inject 0.3 mg into the muscle as needed for anaphylaxis. 1 each 5   fluticasone-salmeterol (WIXELA INHUB) 250-50 MCG/ACT AEPB Inhale 1 puff then rinse mouth, twice daily 60 each 12   hydrOXYzine (ATARAX) 10 MG tablet Take 1 tablet (10 mg total) by mouth daily as needed for anxiety. 90 tablet 0   ipratropium (ATROVENT) 0.03 % nasal spray Place 2 sprays into the nose 2 (two) times daily. 2 sprays each nostril twice daily 30 mL 5   ipratropium-albuterol (DUONEB) 0.5-2.5 (3) MG/3ML SOLN Take 3 mLs by nebulization every 6 (six) hours as needed (shortness of breath/wheezing.). 360 mL 5    irbesartan (AVAPRO) 300 MG tablet Take 1 tablet (300 mg total) by mouth daily. 30 tablet 5   mirtazapine (REMERON) 15 MG tablet Take 1 tablet (15 mg total) by mouth at bedtime. 90 tablet 0   omeprazole (PRILOSEC) 20 MG capsule Take 20 mg by mouth daily.     venlafaxine XR (EFFEXOR-XR) 75 MG 24 hr capsule Take 3 capsules (225 mg total) by mouth daily with breakfast. (Patient taking differently: Take 225 mg by mouth every evening.) 270 capsule 0   No current facility-administered medications for this visit.    PHYSICAL EXAM There were no vitals filed for this visit.  Well appearing in no distress Regular rate and rhythm Unlabored breathing Palpable radial and pedal pulses Mild edema of LLE with possible early dyspigmentation of medial mallelolar skin   PERTINENT LABORATORY AND RADIOLOGIC DATA  Most recent CBC    Latest Ref Rng & Units 02/08/2022   10:14 AM 12/30/2021    9:32 PM 09/03/2021   12:33 PM  CBC  WBC 4.0 - 10.5 K/uL 5.8  9.3  7.6   Hemoglobin 12.0 - 15.0 g/dL 12.3  13.3  13.3   Hematocrit 36.0 - 46.0 % 38.2  40.1  39.6   Platelets 150 - 400 K/uL 318  408  389      Most recent CMP    Latest Ref Rng & Units 02/08/2022   10:14 AM 12/30/2021    9:32 PM 09/03/2021   12:33 PM  CMP  Glucose 70 - 99 mg/dL 111  153  97   BUN 6 - 20 mg/dL '11  12  9   '$ Creatinine 0.44 - 1.00 mg/dL 0.74  0.73  0.71   Sodium 135 - 145 mmol/L 139  139  137   Potassium 3.5 - 5.1 mmol/L 3.3  3.7  3.5   Chloride 98 - 111 mmol/L 103  108  103   CO2 22 - 32 mmol/L '27  23  26   '$ Calcium 8.9 -  10.3 mg/dL 8.8  9.2  9.0   Total Protein 6.5 - 8.1 g/dL  7.9  7.3   Total Bilirubin 0.3 - 1.2 mg/dL  0.3  1.0   Alkaline Phos 38 - 126 U/L  133  116   AST 15 - 41 U/L  17  21   ALT 0 - 44 U/L  22  35     Renal function CrCl cannot be calculated (Patient's most recent lab result is older than the maximum 21 days allowed.).  Hemoglobin A1C (%)  Date Value  09/13/2021 5.6    LDL Cholesterol (Calc)  Date  Value Ref Range Status  05/03/2021 145 (H) mg/dL (calc) Final    Comment:    Reference range: <100 . Desirable range <100 mg/dL for primary prevention;   <70 mg/dL for patients with CHD or diabetic patients  with > or = 2 CHD risk factors. Marland Kitchen LDL-C is now calculated using the Martin-Hopkins  calculation, which is a validated novel method providing  better accuracy than the Friedewald equation in the  estimation of LDL-C.  Cresenciano Genre et al. Annamaria Helling. [id number]): 2061-2068  (http://education.[facility].com/faq/FAQ164)       +-------+-----------+-----------+------------+------------+  ABI/TBIToday's ABIToday's TBIPrevious ABIPrevious TBI  +-------+-----------+-----------+------------+------------+  Right 1.28       .88                                  +-------+-----------+-----------+------------+------------+  Left  1.27       .98                                  +-------+-----------+-----------+------------+------------+   DVT scan 02/08/22 - thrombophlebitis  Yevonne Aline. Stanford Breed, MD FACS [facility] Phone Number: [phone] 03/09/2022 8:30 PM   Total time spent on preparing this encounter including chart review, data review, collecting history, examining the patient, coordinating care for this new patient, 45 minutes.  Portions of this report may have been transcribed using voice recognition software.  Every effort has been made to ensure accuracy; however, inadvertent computerized transcription errors may still be present.

## 2022-03-10 ENCOUNTER — Encounter: Payer: Self-pay | Admitting: Vascular Surgery

## 2022-03-10 ENCOUNTER — Ambulatory Visit (INDEPENDENT_AMBULATORY_CARE_PROVIDER_SITE_OTHER): Payer: Medicaid Other | Admitting: Vascular Surgery

## 2022-03-10 VITALS — BP 136/93 | HR 78 | Temp 98.1°F | Resp 14 | Ht 64.5 in | Wt 191.0 lb

## 2022-03-10 DIAGNOSIS — M7989 Other specified soft tissue disorders: Secondary | ICD-10-CM

## 2022-03-10 DIAGNOSIS — I872 Venous insufficiency (chronic) (peripheral): Secondary | ICD-10-CM

## 2022-03-13 ENCOUNTER — Other Ambulatory Visit: Payer: Self-pay

## 2022-03-13 DIAGNOSIS — I872 Venous insufficiency (chronic) (peripheral): Secondary | ICD-10-CM

## 2022-03-14 ENCOUNTER — Ambulatory Visit: Payer: Self-pay | Admitting: Family Medicine

## 2022-03-17 ENCOUNTER — Other Ambulatory Visit (HOSPITAL_COMMUNITY)
Admission: RE | Admit: 2022-03-17 | Discharge: 2022-03-17 | Disposition: A | Discharge status: Home / Self Care | Payer: Medicaid Other | Source: Ambulatory Visit | Attending: Family Medicine | Admitting: Family Medicine

## 2022-03-17 ENCOUNTER — Ambulatory Visit (INDEPENDENT_AMBULATORY_CARE_PROVIDER_SITE_OTHER): Payer: Medicaid Other | Admitting: Family Medicine

## 2022-03-17 ENCOUNTER — Other Ambulatory Visit (INDEPENDENT_AMBULATORY_CARE_PROVIDER_SITE_OTHER): Payer: Medicaid Other

## 2022-03-17 ENCOUNTER — Encounter: Payer: Self-pay | Admitting: Family Medicine

## 2022-03-17 VITALS — BP 104/60 | HR 80 | Temp 98.3°F | Ht 66.0 in | Wt 193.7 lb

## 2022-03-17 DIAGNOSIS — R87619 Unspecified abnormal cytological findings in specimens from cervix uteri: Secondary | ICD-10-CM | POA: Diagnosis not present

## 2022-03-17 DIAGNOSIS — Z1231 Encounter for screening mammogram for malignant neoplasm of breast: Secondary | ICD-10-CM

## 2022-03-17 DIAGNOSIS — Z01419 Encounter for gynecological examination (general) (routine) without abnormal findings: Secondary | ICD-10-CM

## 2022-03-17 DIAGNOSIS — Z124 Encounter for screening for malignant neoplasm of cervix: Secondary | ICD-10-CM | POA: Insufficient documentation

## 2022-03-17 DIAGNOSIS — E782 Mixed hyperlipidemia: Secondary | ICD-10-CM

## 2022-03-17 DIAGNOSIS — I10 Essential (primary) hypertension: Secondary | ICD-10-CM

## 2022-03-17 DIAGNOSIS — Z0001 Encounter for general adult medical examination with abnormal findings: Secondary | ICD-10-CM | POA: Diagnosis not present

## 2022-03-17 LAB — LIPID PANEL
Cholesterol: 209 mg/dL — ABNORMAL HIGH (ref 0–200)
HDL: 57.3 mg/dL (ref >39.00)
LDL Cholesterol: 126 mg/dL — ABNORMAL HIGH (ref 0–99)
NonHDL: 151.53
Total CHOL/HDL Ratio: 4
Triglycerides: 126 mg/dL (ref 0.0–149.0)
VLDL: 25.2 mg/dL (ref 0.0–40.0)

## 2022-03-17 MED ORDER — IRBESARTAN 300 MG PO TABS: 300.0000 mg | ORAL_TABLET | Freq: Every day | ORAL | 5 refills | Status: DC

## 2022-03-17 NOTE — Progress Notes (Signed)
Complete physical exam  Patient: Nancy Barnes   DOB: 09-Sep-1970   52 y.o. Female  MRN: [id number]  Subjective:    Chief Complaint  Patient presents with   Annual Exam    Nancy Barnes is a 52 y.o. female who presents today for a complete physical exam. She reports consuming a general diet. The patient does not participate in regular exercise at present. She generally feels well. She reports sleeping well. She does not have additional problems to discuss today.    Most recent fall risk assessment:     No data to display           Most recent depression screenings:    03/17/2022   10:53 AM 02/14/2022   11:23 AM  PHQ 2/9 Scores  PHQ - 2 Score 0 0  PHQ- 9 Score 5 5    Vision:Within last year and Dental: No current dental problems and No regular dental care   Patient Active Problem List   Diagnosis Date Noted   Postoperative breast asymmetry 01/31/2020   Hypertension 09/02/2019   OSA (obstructive sleep apnea) 06/17/2019   Back pain 04/29/2018   Neck pain 04/29/2018   Symptomatic mammary hypertrophy 04/29/2018   GAD (generalized anxiety disorder) 11/03/2013   Major psychotic depression, recurrent ([place]) 11/03/2013   Seasonal and perennial allergic rhinitis 02/29/2012   Food allergy 02/29/2012   Medication intolerance 02/29/2012   Asthma, moderate persistent 12/23/2011   Depression 12/09/2011   Anxiety 12/09/2011      Patient Care Team: Farrel Conners, MD as PCP - General (Family Medicine) Early Osmond, MD as PCP - Cardiology (Cardiology) Luberta Robertson, Darden Dates, MD as Referring Physician (Allergy and Immunology) Harlen Labs, MD as Referring Physician (Optometry) Rhueland Hawthorne (Obstetrics and Gynecology)   Outpatient Medications Prior to Visit  Medication Sig   albuterol (VENTOLIN HFA) 108 (90 Base) MCG/ACT inhaler INHALE 2 PUFFS BY MOUTH EVERY 4 HOURS AS NEEDED FOR WHEEZING AND FOR SHORTNESS OF BREATH (Patient taking differently: Inhale  2 puffs into the lungs every 4 (four) hours as needed for wheezing or shortness of breath.)   budesonide (PULMICORT) 0.5 MG/2ML nebulizer solution Take 2 mLs (0.5 mg total) by nebulization daily. (Patient taking differently: Take 0.5 mg by nebulization daily as needed (shortness of breath (winter months)).)   cetirizine (ZYRTEC) 10 MG chewable tablet Chew 10 mg by mouth daily.   diltiazem (CARDIZEM CD) 120 MG 24 hr capsule Take 1 capsule (120 mg total) by mouth daily.   EPINEPHrine 0.3 mg/0.3 mL IJ SOAJ injection Inject 0.3 mg into the muscle as needed for anaphylaxis.   fluticasone-salmeterol (WIXELA INHUB) 250-50 MCG/ACT AEPB Inhale 1 puff then rinse mouth, twice daily   hydrOXYzine (ATARAX) 10 MG tablet Take 1 tablet (10 mg total) by mouth daily as needed for anxiety.   ipratropium (ATROVENT) 0.03 % nasal spray Place 2 sprays into the nose 2 (two) times daily. 2 sprays each nostril twice daily   ipratropium-albuterol (DUONEB) 0.5-2.5 (3) MG/3ML SOLN Take 3 mLs by nebulization every 6 (six) hours as needed (shortness of breath/wheezing.).   mirtazapine (REMERON) 15 MG tablet Take 1 tablet (15 mg total) by mouth at bedtime.   omeprazole (PRILOSEC) 20 MG capsule Take 20 mg by mouth daily.   venlafaxine XR (EFFEXOR-XR) 75 MG 24 hr capsule Take 3 capsules (225 mg total) by mouth daily with breakfast. (Patient taking differently: Take 225 mg by mouth every evening.)   [DISCONTINUED] irbesartan (AVAPRO) 300  MG tablet Take 1 tablet (300 mg total) by mouth daily.   No facility-administered medications prior to visit.    Review of Systems  HENT:  Negative for hearing loss.   Eyes:  Negative for blurred vision.  Respiratory:  Negative for shortness of breath.   Cardiovascular:  Negative for chest pain.  Gastrointestinal: Negative.   Genitourinary: Negative.   Musculoskeletal:  Negative for back pain.  Neurological:  Negative for headaches.  Psychiatric/Behavioral:  Negative for depression.   All  other systems reviewed and are negative.         Objective:     BP 104/60 (BP Location: Left Arm, Patient Position: Sitting, Cuff Size: Normal)   Pulse 80   Temp 98.3 F (36.8 C) (Oral)   Ht '5\' 6"'$  (1.676 m)   Wt 193 lb 11.2 oz (87.9 kg)   SpO2 98%   BMI 31.26 kg/m    Physical Exam Vitals reviewed. Exam conducted with a chaperone present.  Constitutional:      Appearance: She is obese.  Cardiovascular:     Rate and Rhythm: Normal rate and regular rhythm.     Pulses: Normal pulses.     Heart sounds: Normal heart sounds.  Pulmonary:     Effort: Pulmonary effort is normal.     Breath sounds: Normal breath sounds.  Chest:     Chest wall: No mass.  Breasts:    Tanner Score is 5.     Right: Normal. No mass or tenderness.     Left: Normal. No mass or tenderness.  Abdominal:     General: Abdomen is flat. Bowel sounds are normal.     Palpations: Abdomen is soft.  Genitourinary:    General: Normal vulva.     Exam position: Lithotomy position.     Tanner stage (genital): 5.     Vagina: Normal.     Cervix: Normal.     Uterus: Normal.      Adnexa: Right adnexa normal and left adnexa normal.     Rectum: Normal.        Comments: Pt had 3 polyps protruding from the cervical os Lymphadenopathy:     Upper Body:     Right upper body: No axillary adenopathy.     Left upper body: No axillary adenopathy.  Neurological:     General: No focal deficit present.     Mental Status: She is alert and oriented to person, place, and time.  Psychiatric:        Mood and Affect: Mood and affect normal.      No results found for any visits on 03/17/22.     Assessment & Plan:    Routine Health Maintenance and Physical Exam  Immunization History  Administered Date(s) Administered   Influenza Split 12/10/2011   Influenza,inj,Quad PF,6+ Mos 10/15/2012, 09/23/2019   Moderna Sars-Covid-2 Vaccination 01/20/2019, 03/01/2019, 01/14/2020   Tdap 09/26/2015    Health Maintenance   Topic Date Due   PAP SMEAR-Modifier  Never done   COVID-19 Vaccine (4 - 2023-24 season) 04/02/2022 (Originally 09/13/2021)   INFLUENZA VACCINE  04/13/2022 (Originally 08/13/2021)   Hepatitis C Screening  05/04/2022 (Originally 08/22/1989)   HIV Screening  05/04/2022 (Originally 08/23/1986)   Zoster Vaccines- Shingrix (1 of 2) 05/15/2022 (Originally 08/22/2021)   MAMMOGRAM  02/21/2023   Fecal DNA (Cologuard)  10/03/2024   DTaP/Tdap/Td (2 - Td or Tdap) 09/25/2025   HPV VACCINES  Aged Out    Discussed health benefits of physical activity, and  encouraged her to engage in regular exercise appropriate for her age and condition.  Problem List Items Addressed This Visit       Unprioritized   Hypertension   Relevant Medications   irbesartan (AVAPRO) 300 MG tablet   Other Visit Diagnoses     Breast cancer screening by mammogram    -  Primary   Relevant Orders   MM Digital Screening   Encounter for Papanicolaou smear for cervical cancer screening       Relevant Orders   Patient does have some polyps noted in the cervical os, pap was taken for evaluation today.  Otherwise physical exam findings were WNL.  PAP [[place]]   Mixed hyperlipidemia       Relevant Medications   irbesartan (AVAPRO) 300 MG tablet   Other Relevant Orders   Lipid Panel      No follow-ups on file.     Farrel Conners, MD

## 2022-03-17 NOTE — Patient Instructions (Signed)

## 2022-03-20 LAB — CYTOLOGY - PAP
Comment: NEGATIVE
High risk HPV: NEGATIVE

## 2022-03-21 ENCOUNTER — Telehealth: Payer: Self-pay | Admitting: Family Medicine

## 2022-03-21 NOTE — Addendum Note (Signed)
Addended by: Farrel Conners on: 03/21/2022 04:25 PM   Modules accepted: Orders

## 2022-03-21 NOTE — Telephone Encounter (Signed)
Pt is calling and she has view her pap results on mychart and per pt pap is abnormal and would like callback

## 2022-03-25 NOTE — Progress Notes (Signed)
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 Medications (nonsteroidal anti-inflammatory drugs, Toradol) caused facial caused facial swelling/angioedema. Foods-pecan, walnut caused itchiness and sores in mouth.             Shrimp, crab caused chest tightness 3 years ago. Seasonal allergic rhinitis- pollens and cats. Latex-rash Allergy Skin Test- 05/19/12- POS especially grass, tree, dust mite, cockroach. 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  Spends some time at her parents home.  Prefers to sleep on her stomach.  Says she gags with anything her mouth and not interested in oral appliance.  Not interested in surgical approaches.  Download reviewed.  She notes she gasps still occasionally if she lies on her back.  She is pressure limiting so we will increase the pressure range to 5-20.  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 Sleeping well with  CPAP. Some increase in chest tightness with recent pollen, needing inhaler refills.  ROS-see HPI   + = positive Constitutional:   No-   weight loss, night sweats, fevers, chills, fatigue, lassitude. HEENT:   No-  headaches, difficulty swallowing, tooth/dental problems, sore throat,      +sneezing, itching, ear ache, +nasal congestion, post nasal drip,  CV:  No-   chest pain, orthopnea, PND, swelling in lower extremities, anasarca, dizziness, +palpitations Resp: + shortness of breath with exertion or at rest.             + productive cough,  No non-productive cough,  No- coughing up of blood.              No-   change in color of mucus.  + wheezing.   Skin: No-   rash or lesions. GI:  + heartburn, indigestion, abdominal pain, nausea, vomiting,  GU: . MS:  No-   joint pain or swelling.   Neuro-     nothing unusual Psych:  No- change in mood or affect. + depression or +anxiety.  No memory loss.  OBJ- Physical Exam General- Alert, Oriented, Affect-appropriate, Distress- none acute Skin- rash-none, lesions- none, excoriation- none Lymphadenopathy- none Head- atraumatic            Eyes- Gross vision intact, PERRLA, conjunctivae and secretions clear            Ears- Hearing, canals-normal            Nose- clear, no-Septal dev, mucus, polyps, erosion, perforation             Throat- Mallampati II , mucosa clear , drainage- none, tonsils- atrophic Neck- flexible , trachea midline, no stridor , thyroid nl, carotid no bruit Chest -  symmetrical excursion , unlabored           Heart/CV- RRR , no murmur , no gallop  , no rub, nl s1 s2                           - JVD- none , edema- none, stasis changes- none, varices- none           Lung-  wheeze+minimal,  cough-none , dullness-none, rub- none           Chest wall-  Abd-  Br/ Gen/ Rectal- Not done, not indicated Extrem- cyanosis- none, clubbing, none, atrophy- none, strength- nl Neuro- grossly intact to observation

## 2022-03-27 ENCOUNTER — Ambulatory Visit: Payer: Medicaid Other | Admitting: Internal Medicine

## 2022-03-27 ENCOUNTER — Encounter: Payer: Self-pay | Admitting: Internal Medicine

## 2022-03-27 VITALS — BP 130/86 | HR 77 | Ht 66.0 in | Wt 195.2 lb

## 2022-03-27 DIAGNOSIS — G4733 Obstructive sleep apnea (adult) (pediatric): Secondary | ICD-10-CM

## 2022-03-27 DIAGNOSIS — J4540 Moderate persistent asthma, uncomplicated: Secondary | ICD-10-CM

## 2022-03-27 MED ORDER — FLUTICASONE-SALMETEROL 250-50 MCG/ACT IN AEPB: INHALATION_SPRAY | RESPIRATORY_TRACT | 12 refills | Status: DC

## 2022-03-27 MED ORDER — ALBUTEROL SULFATE HFA 108 (90 BASE) MCG/ACT IN AERS: INHALATION_SPRAY | RESPIRATORY_TRACT | 12 refills | Status: DC

## 2022-03-27 NOTE — Patient Instructions (Addendum)
Order- DME [facility]- continue auto 5-20. Please replace mask and supplies as needed  Refill script sent for Wixela and Ventolin  We discussed checking with [facility]- Dr Neldon Mc, et.al. they will have up to date information about use of Xolair to trat food allergy.

## 2022-03-30 DIAGNOSIS — G4733 Obstructive sleep apnea (adult) (pediatric): Secondary | ICD-10-CM | POA: Diagnosis not present

## 2022-04-08 ENCOUNTER — Other Ambulatory Visit (HOSPITAL_COMMUNITY): Payer: Self-pay | Admitting: Psychiatry

## 2022-04-08 DIAGNOSIS — F5105 Insomnia due to other mental disorder: Secondary | ICD-10-CM

## 2022-04-08 DIAGNOSIS — F332 Major depressive disorder, recurrent severe without psychotic features: Secondary | ICD-10-CM

## 2022-04-08 DIAGNOSIS — F411 Generalized anxiety disorder: Secondary | ICD-10-CM

## 2022-04-17 ENCOUNTER — Telehealth (HOSPITAL_BASED_OUTPATIENT_CLINIC_OR_DEPARTMENT_OTHER): Payer: Medicaid Other | Admitting: Psychiatry

## 2022-04-17 DIAGNOSIS — F902 Attention-deficit hyperactivity disorder, combined type: Secondary | ICD-10-CM | POA: Diagnosis not present

## 2022-04-17 DIAGNOSIS — F5105 Insomnia due to other mental disorder: Secondary | ICD-10-CM | POA: Diagnosis not present

## 2022-04-17 DIAGNOSIS — F411 Generalized anxiety disorder: Secondary | ICD-10-CM

## 2022-04-17 DIAGNOSIS — F332 Major depressive disorder, recurrent severe without psychotic features: Secondary | ICD-10-CM

## 2022-04-17 DIAGNOSIS — F99 Mental disorder, not otherwise specified: Secondary | ICD-10-CM

## 2022-04-17 MED ORDER — VENLAFAXINE HCL ER 75 MG PO CP24: 225.0000 mg | ORAL_CAPSULE | Freq: Every day | ORAL | 0 refills | Status: DC

## 2022-04-17 MED ORDER — MIRTAZAPINE 15 MG PO TABS: 15.0000 mg | ORAL_TABLET | Freq: Every day | ORAL | 0 refills | Status: DC

## 2022-04-17 MED ORDER — HYDROXYZINE HCL 10 MG PO TABS: 10.0000 mg | ORAL_TABLET | Freq: Every day | ORAL | 0 refills | Status: DC | PRN

## 2022-04-17 NOTE — Progress Notes (Signed)
Virtual Visit via Video Note  I connected with Nancy Barnes on 04/17/22 at  1:00 PM EDT by a video enabled telemedicine application and verified that I am speaking with the correct person using two identifiers.  Location: Patient: in parked car Provider: office   I discussed the limitations of evaluation and management by telemedicine and the availability of in person appointments. The patient expressed understanding and agreed to proceed.  History of Present Illness: Nancy Barnes shares she is doing well. Nothing new has happened since we last met in mid- January. "I have don't have anything spectacular to talk about". She is now working at the [employer]. Nancy Barnes is still taking steps to get involved in health insurance. Her concentration is good at work but not so much at home. She has "ADHD paralysis" when she tries to do things at home.  Her mood is stable. She has rare anxiety and it is always situational. When she does get anxious it is manageable. Nancy Barnes denies depression and anhedonia. She denies SI/HI. Her sleep is good. She is getting 8 hrs/night. The meds are working and she denies SE.    Observations/Objective: Psychiatric Specialty Exam: ROS  There were no vitals taken for this visit.There is no height or weight on file to calculate BMI.  General Appearance: Fairly Groomed and Neat  Eye Contact:  Good  Speech:  Clear and Coherent and Normal Rate  Volume:  Normal  Mood:  Euthymic  Affect:  Full Range  Thought Process:  Goal Directed, Linear, and Descriptions of Associations: Intact  Orientation:  Full (Time, Place, and Person)  Thought Content:  Logical  Suicidal Thoughts:  No  Homicidal Thoughts:  No  Memory:  Immediate;   Good  Judgement:  Good  Insight:  Good  Psychomotor Activity:  Normal  Concentration:  Concentration: Good  Recall:  Good  Fund of Knowledge:  Good  Language:  Good  Akathisia:  No  Handed:  Right  AIMS (if indicated):     Assets:  Communication  Skills Desire for Improvement Financial Resources/Insurance Housing Leisure Time Resilience Social Support Talents/Skills Transportation Vocational/Educational  ADL's:  Intact  Cognition:  WNL  Sleep:        Assessment and Plan:     04/17/2022    1:05 PM 03/17/2022   10:53 AM 02/14/2022   11:23 AM 01/30/2022    1:54 PM 12/19/2021    1:17 PM  Depression screen PHQ 2/9  Decreased Interest 0 0 0 0 0  Down, Depressed, Hopeless 0 0 0 0 2  PHQ - 2 Score 0 0 0 0 2  Altered sleeping  0 0 0 3  Tired, decreased energy  3 2 1 3   Change in appetite  0 0 0 3  Feeling bad or failure about yourself   2 3 2 3   Trouble concentrating  0  3 3  Moving slowly or fidgety/restless  0 0 0 0  Suicidal thoughts  0 0 0 0  PHQ-9 Score  5 5 6 17   Difficult doing work/chores  Not difficult at all  Not difficult at all Very difficult    Flowsheet Row Video Visit from 04/17/2022 in [facility] from 02/08/2022 in [facility] Department at [facility] Video Visit from 01/30/2022 in [facility] No Risk No Risk No Risk         Pt is aware that these meds carry a teratogenic  risk. Pt will discuss plan of action if she does or plans to become pregnant in the future.  Status of current problems: stable   Medication management with supportive therapy. Risks and benefits, side effects and alternative treatment options discussed with patient. Pt was given an opportunity to ask questions about medication, illness, and treatment. All current psychiatric medications have been reviewed and discussed with the patient and adjusted as clinically appropriate.  Pt verbalized understanding and verbal consent obtained for treatment.  Meds:  1. Attention deficit hyperactivity disorder (ADHD), combined type  2. GAD (generalized anxiety disorder) - hydrOXYzine (ATARAX) 10 MG tablet; Take 1 tablet (10 mg  total) by mouth daily as needed for anxiety.  Dispense: 90 tablet; Refill: 0 - mirtazapine (REMERON) 15 MG tablet; Take 1 tablet (15 mg total) by mouth at bedtime.  Dispense: 90 tablet; Refill: 0 - venlafaxine XR (EFFEXOR-XR) 75 MG 24 hr capsule; Take 3 capsules (225 mg total) by mouth daily with breakfast.  Dispense: 270 capsule; Refill: 0  3. Insomnia due to other mental disorder - hydrOXYzine (ATARAX) 10 MG tablet; Take 1 tablet (10 mg total) by mouth daily as needed for anxiety.  Dispense: 90 tablet; Refill: 0 - mirtazapine (REMERON) 15 MG tablet; Take 1 tablet (15 mg total) by mouth at bedtime.  Dispense: 90 tablet; Refill: 0  4. Severe episode of recurrent major depressive disorder, without psychotic features - mirtazapine (REMERON) 15 MG tablet; Take 1 tablet (15 mg total) by mouth at bedtime.  Dispense: 90 tablet; Refill: 0 - venlafaxine XR (EFFEXOR-XR) 75 MG 24 hr capsule; Take 3 capsules (225 mg total) by mouth daily with breakfast.  Dispense: 270 capsule; Refill: 0     Labs: none    Therapy: brief supportive therapy provided. Discussed psychosocial stressors in detail.      Collaboration of Care: Other none  Patient/Guardian was advised Release of Information must be obtained prior to any record release in order to collaborate their care with an outside provider. Patient/Guardian was advised if they have not already done so to contact the registration department to sign all necessary forms in order for [place] to release information regarding their care.   Consent: Patient/Guardian gives verbal consent for treatment and assignment of benefits for services provided during this visit. Patient/Guardian expressed understanding and agreed to proceed.      Follow Up Instructions: Follow up in 2-3 months or sooner if needed    I discussed the assessment and treatment plan with the patient. The patient was provided an opportunity to ask questions and all were answered. The patient  agreed with the plan and demonstrated an understanding of the instructions.   The patient was advised to call back or seek an in-person evaluation if the symptoms worsen or if the condition fails to improve as anticipated.  I provided 8 minutes of non-face-to-face time during this encounter.   Charlcie Cradle, MD

## 2022-04-21 ENCOUNTER — Encounter: Payer: Self-pay | Admitting: Family Medicine

## 2022-04-21 NOTE — Telephone Encounter (Signed)
Can you help with this?

## 2022-04-21 NOTE — Telephone Encounter (Signed)
Referral was sent off but I just rerouted it to [facility].

## 2022-04-29 ENCOUNTER — Ambulatory Visit
Admission: RE | Admit: 2022-04-29 | Discharge: 2022-04-29 | Disposition: A | Discharge status: Home / Self Care | Payer: Medicaid Other | Source: Ambulatory Visit | Attending: Family Medicine | Admitting: Family Medicine

## 2022-04-29 DIAGNOSIS — Z1231 Encounter for screening mammogram for malignant neoplasm of breast: Secondary | ICD-10-CM | POA: Diagnosis not present

## 2022-04-30 ENCOUNTER — Encounter: Payer: Self-pay | Admitting: Internal Medicine

## 2022-04-30 NOTE — Assessment & Plan Note (Addendum)
Persistent without significant exacerbation. Need to watch during pollen season. Plan- refill inhalers. Watch need to consider Biologic.

## 2022-04-30 NOTE — Assessment & Plan Note (Signed)
Benefits from CPAP Plan- DME to replace CPAP supplies. Continue auto 5-20

## 2022-05-19 ENCOUNTER — Encounter (HOSPITAL_COMMUNITY): Payer: Self-pay

## 2022-06-02 ENCOUNTER — Encounter: Payer: Self-pay | Admitting: Surgery

## 2022-06-02 ENCOUNTER — Ambulatory Visit (HOSPITAL_COMMUNITY)
Admission: RE | Admit: 2022-06-02 | Discharge: 2022-06-02 | Disposition: A | Discharge status: Home / Self Care | Payer: Medicaid Other | Source: Ambulatory Visit | Attending: Surgery | Admitting: Surgery

## 2022-06-02 ENCOUNTER — Ambulatory Visit (INDEPENDENT_AMBULATORY_CARE_PROVIDER_SITE_OTHER): Payer: Medicaid Other | Admitting: Surgery

## 2022-06-02 VITALS — BP 135/91 | HR 81 | Temp 97.6°F | Resp 16 | Ht 65.0 in | Wt 200.0 lb

## 2022-06-02 DIAGNOSIS — I890 Lymphedema, not elsewhere classified: Secondary | ICD-10-CM | POA: Diagnosis not present

## 2022-06-02 DIAGNOSIS — I872 Venous insufficiency (chronic) (peripheral): Secondary | ICD-10-CM | POA: Insufficient documentation

## 2022-06-02 NOTE — Progress Notes (Addendum)
[facility] [place]  Patient name: Nancy Barnes MRN: [id number] DOB: 09-Jun-1970 Sex: female   REASON FOR VISIT:    Follow up  HISOTRY OF PRESENT ILLNESS:    Nancy Barnes is a 52 y.o. female who was seen by Dr. Lenell Antu in February 2024 for left leg swelling.  The patient has a history of a SVT in the left leg.  She experiences swelling in the leg which progresses over the course of the day and is most noticeable at night when she has been standing for prolonged periods.  She also reports discoloration of the leg with the skin appearing red at night.  The patient was counseled about compression elevation and exercise.  She returns today for reflux imaging.  She states that still having issues with leg swelling.  The compression socks helped a little bit   PAST MEDICAL HISTORY:   Past Medical History:  Diagnosis Date   Anxiety    Asthma    Chicken pox    Complication of anesthesia    unable to tolerate NSAIDS   Depression    Environmental allergies    food/medication   Family history of adverse reaction to anesthesia    mother skin glue allergy post surgery    GERD (gastroesophageal reflux disease)    Headache(784.0)    Hypertension    Hypokalemia    Pneumomediastinum (HCC)    Seasonal allergies    Sleep apnea    Suicidal ideations    UTI (urinary tract infection)      FAMILY HISTORY:   Family History  Problem Relation Age of Onset   Heart attack Mother 20       Bypass x5   Allergies Mother    CAD Mother    High blood pressure Mother    High Cholesterol Mother    Heart disease Mother    Arthritis Mother    Heart failure Mother    Diabetes Mellitus II Father    Hypertension Father    Heart disease Father    CAD Father    Other Father        Agent orange exposure   Arthritis Father    Depression Father    Atrial fibrillation Father    Allergies Daughter    Psoriasis Daughter     Arthritis Daughter        psoriatic   Hypothyroidism Daughter    Breast cancer Maternal Aunt    Allergies Maternal Grandmother    Atrial fibrillation Maternal Grandmother    Stroke Maternal Grandmother    Asthma Maternal Grandfather    Asthma Paternal Grandmother    Asthma Paternal Grandfather    Suicidality Neg Hx    Anxiety disorder Neg Hx     SOCIAL HISTORY:   Social History   Tobacco Use   Smoking status: Never   Smokeless tobacco: Never  Substance Use Topics   Alcohol use: Not Currently    Comment: socially     ALLERGIES:   Allergies  Allergen Reactions   Nsaids Anaphylaxis and Shortness Of Breath   Shellfish Allergy Anaphylaxis   Abilify [Aripiprazole] Other (See Comments)    hallucinations   Amlodipine     swelling   Amoxicillin Hives   Fish Allergy Other (See Comments)    Reaction unknown   Latuda [Lurasidone Hcl] Other (See Comments)    Uncontrollable shaking, hopelessness.   Levaquin [Levofloxacin In D5w] Other (See Comments)    Reaction unknown  Other Other (See Comments)    Reaction unknown to nuts   Penicillins Other (See Comments)    Reaction unknown Has patient had a PCN reaction causing immediate rash, facial/tongue/throat swelling, SOB or lightheadedness with hypotension: Yes Has patient had a PCN reaction causing severe rash involving mucus membranes or skin necrosis: No Has patient had a PCN reaction that required hospitalization: No Has patient had a PCN reaction occurring within the last 10 years: No If all of the above answers are "NO", then may proceed with Cephalosporin use.   Prednisone Other (See Comments)    Pruritus/ hives   Seroquel [Quetiapine Fumarate] Other (See Comments)    Patient states it made her not care about the world and she just sat down and didn't get up.   Ativan [Lorazepam] Anxiety    Shakiness   Sulfa Antibiotics Rash   Xanax [Alprazolam] Anxiety    Makes pt. anxious     CURRENT MEDICATIONS:   Current  Outpatient Medications  Medication Sig Dispense Refill   albuterol (VENTOLIN HFA) 108 (90 Base) MCG/ACT inhaler INHALE 2 PUFFS BY MOUTH EVERY 4 HOURS AS NEEDED FOR WHEEZING AND FOR SHORTNESS OF BREATH 18 g 12   budesonide (PULMICORT) 0.5 MG/2ML nebulizer solution Take 2 mLs (0.5 mg total) by nebulization daily. (Patient taking differently: Take 0.5 mg by nebulization daily as needed (shortness of breath (winter months)).) 60 mL 5   cetirizine (ZYRTEC) 10 MG chewable tablet Chew 10 mg by mouth daily.     diltiazem (CARDIZEM CD) 120 MG 24 hr capsule Take 1 capsule (120 mg total) by mouth daily. 30 capsule 5   EPINEPHrine 0.3 mg/0.3 mL IJ SOAJ injection Inject 0.3 mg into the muscle as needed for anaphylaxis. 1 each 5   fluticasone-salmeterol (WIXELA INHUB) 250-50 MCG/ACT AEPB Inhale 1 puff then rinse mouth, twice daily 60 each 12   hydrOXYzine (ATARAX) 10 MG tablet Take 1 tablet (10 mg total) by mouth daily as needed for anxiety. 90 tablet 0   ipratropium (ATROVENT) 0.03 % nasal spray Place 2 sprays into the nose 2 (two) times daily. 2 sprays each nostril twice daily 30 mL 5   ipratropium-albuterol (DUONEB) 0.5-2.5 (3) MG/3ML SOLN Take 3 mLs by nebulization every 6 (six) hours as needed (shortness of breath/wheezing.). 360 mL 5   irbesartan (AVAPRO) 300 MG tablet Take 1 tablet (300 mg total) by mouth daily. 30 tablet 5   mirtazapine (REMERON) 15 MG tablet Take 1 tablet (15 mg total) by mouth at bedtime. 90 tablet 0   omeprazole (PRILOSEC) 20 MG capsule Take 20 mg by mouth daily.     venlafaxine XR (EFFEXOR-XR) 75 MG 24 hr capsule Take 3 capsules (225 mg total) by mouth daily with breakfast. 270 capsule 0   No current facility-administered medications for this visit.    REVIEW OF SYSTEMS:   [X]  denotes positive finding, [ ]  denotes negative finding Cardiac  Comments:  Chest pain or chest pressure:    Shortness of breath upon exertion:    Short of breath when lying flat:    Irregular heart  rhythm:        Vascular    Pain in calf, thigh, or hip brought on by ambulation:    Pain in feet at night that wakes you up from your sleep:     Blood clot in your veins:    Leg swelling:  x       Pulmonary    Oxygen at home:  Productive cough:     Wheezing:         Neurologic    Sudden weakness in arms or legs:     Sudden numbness in arms or legs:     Sudden onset of difficulty speaking or slurred speech:    Temporary loss of vision in one eye:     Problems with dizziness:         Gastrointestinal    Blood in stool:     Vomited blood:         Genitourinary    Burning when urinating:     Blood in urine:        Psychiatric    Major depression:         Hematologic    Bleeding problems:    Problems with blood clotting too easily:        Skin    Rashes or ulcers:        Constitutional    Fever or chills:      PHYSICAL EXAM:   Vitals:   06/02/22 1425  BP: (!) 135/91  Pulse: 81  Resp: 16  Temp: 97.6 F (36.4 C)  TempSrc: Temporal  SpO2: 95%  Weight: 200 lb (90.7 kg)  Height: 5\' 5"  (1.651 m)    GENERAL: The patient is a well-nourished female, in no acute distress. The vital signs are documented above. CARDIAC: There is a regular rate and rhythm.  VASCULAR: Bilateral leg swelling left greater than right.  Reddish discoloration of the left leg PULMONARY: Non-labored respirations MUSCULOSKELETAL: There are no major deformities or cyanosis. NEUROLOGIC: No focal weakness or paresthesias are detected. SKIN: There are no ulcers or rashes noted. PSYCHIATRIC: The patient has a normal affect.  STUDIES:   I have reviewed her reflux study with the following findings: +------------------+---------+------+-----------+------------+--------+  LEFT             Reflux NoRefluxReflux TimeDiameter cmsComments                              Yes                                   +------------------+---------+------+-----------+------------+--------+  CFV               no                                              +------------------+---------+------+-----------+------------+--------+  FV mid            no                                              +------------------+---------+------+-----------+------------+--------+  Popliteal        no                                              +------------------+---------+------+-----------+------------+--------+  GSV at SFJ                  yes    >  500 ms      0.90              +------------------+---------+------+-----------+------------+--------+  GSV prox thigh    no                            0.46              +------------------+---------+------+-----------+------------+--------+  GSV mid thigh     no                            0.37               +------------------+---------+------+-----------+------------+--------+  GSV dist thigh    no                            0.34              +------------------+---------+------+-----------+------------+--------+  GSV at knee       no                            0.30              +------------------+---------+------+-----------+------------+--------+  GSV prox calf     no                            0.29              +------------------+---------+------+-----------+------------+--------+  SSV Pop Fossa     no                            0.25              +------------------+---------+------+-----------+------------+--------+  anterior accessoryno                            0.34              +------------------+---------+------+-----------+------------+--------+       MEDICAL ISSUES:   Leg swelling: The patient's only reflux is at the saphenofemoral junction.  I think that the majority of her symptoms are more related to lymphedema.  She has tried compression and exercise therapy for the past 3 months without significant improvement.  She continues to have hyperpigmentation in her  leg.  I have encouraged her to continue to keep her legs elevated and wear compression socks.  I also think she would be a good candidate for lymphedema pump which I will try to arrange.    Charlena Cross, MD, FACS [facility] [phone] Pager [phone]

## 2022-06-08 DIAGNOSIS — H5213 Myopia, bilateral: Secondary | ICD-10-CM | POA: Diagnosis not present

## 2022-06-20 NOTE — Telephone Encounter (Signed)
Pt called to say she was never able to use the GYN referral created in March 2024, because they never called her.   Pt has Medicaid.  Pt is asking for another GYN referral, and please make sure they accept Medicaid.  Please advise.

## 2022-07-03 ENCOUNTER — Telehealth (HOSPITAL_BASED_OUTPATIENT_CLINIC_OR_DEPARTMENT_OTHER): Payer: Medicaid Other | Admitting: Psychiatry

## 2022-07-03 DIAGNOSIS — F5105 Insomnia due to other mental disorder: Secondary | ICD-10-CM | POA: Diagnosis not present

## 2022-07-03 DIAGNOSIS — F99 Mental disorder, not otherwise specified: Secondary | ICD-10-CM | POA: Diagnosis not present

## 2022-07-03 DIAGNOSIS — F332 Major depressive disorder, recurrent severe without psychotic features: Secondary | ICD-10-CM

## 2022-07-03 DIAGNOSIS — F411 Generalized anxiety disorder: Secondary | ICD-10-CM

## 2022-07-03 MED ORDER — MIRTAZAPINE 15 MG PO TABS: 15.0000 mg | ORAL_TABLET | Freq: Every day | ORAL | 1 refills | Status: DC

## 2022-07-03 MED ORDER — VENLAFAXINE HCL ER 75 MG PO CP24: 225.0000 mg | ORAL_CAPSULE | Freq: Every day | ORAL | 1 refills | Status: DC

## 2022-07-03 MED ORDER — HYDROXYZINE HCL 10 MG PO TABS: 10.0000 mg | ORAL_TABLET | Freq: Every day | ORAL | 1 refills | Status: DC | PRN

## 2022-07-03 NOTE — Progress Notes (Signed)
Virtual Visit via Video Note  I connected with Nancy Barnes on 07/03/22 at 10:30 AM EDT by a video enabled telemedicine application and verified that I am speaking with the correct person using two identifiers.  Location: Patient: home Provider: office   I discussed the limitations of evaluation and management by telemedicine and the availability of in person appointments. The patient expressed understanding and agreed to proceed.  History of Present Illness: "I'm good". Nancy Barnes continues to work at the [employer]. It is stress full. It is difficult to deal with customers. She loves her job most of the time. Her focus and concentration at home are poor. She gets overwhelmed at home. At work her focus is good and she is productive. Nancy Barnes shares her depression is stable. Today she is feeling a little off but recalled she forgot to take her meds yesterday. Nancy Barnes shares she wanted to follow me if I was taking on a position elsewhere. I explained that I am not taking on a new position anywhere. Nancy Barnes talked about her concerns with her PCP. Her anxiety is increased due to recent health problems. At least once a day she gets panic like symptoms out of nowhere a small issue. She tries to deal with it by talking herself down. Her reaction feels out of proportion to the stressor. Outside of this her anxiety is manageable. Her sleep and energy are good. She likes to spend her days off in her yard. She denies depression and anhedonia. She denies SI/HI.     Observations/Objective: Psychiatric Specialty Exam: ROS  There were no vitals taken for this visit.There is no height or weight on file to calculate BMI.  General Appearance: Casual and Fairly Groomed  Eye Contact:  Good  Speech:  Clear and Coherent and Normal Rate  Volume:  Normal  Mood:  Euthymic  Affect:  Full Range  Thought Process:  Goal Directed, Linear, and Descriptions of Associations: Intact  Orientation:  Full (Time, Place, and Person)   Thought Content:  Logical  Suicidal Thoughts:  No  Homicidal Thoughts:  No  Memory:  Immediate;   Good  Judgement:  Good  Insight:  Good  Psychomotor Activity:  Normal  Concentration:  Concentration: Good  Recall:  Good  Fund of Knowledge:  Good  Language:  Good  Akathisia:  No  Handed:  Right  AIMS (if indicated):     Assets:  Communication Skills Desire for Improvement Financial Resources/Insurance Housing Leisure Time Resilience Social Support Talents/Skills Transportation Vocational/Educational  ADL's:  Intact  Cognition:  WNL  Sleep:        Assessment and Plan:     07/03/2022   10:53 AM 04/17/2022    1:05 PM 03/17/2022   10:53 AM 02/14/2022   11:23 AM 01/30/2022    1:54 PM  Depression screen PHQ 2/9  Decreased Interest 0 0 0 0 0  Down, Depressed, Hopeless 0 0 0 0 0  PHQ - 2 Score 0 0 0 0 0  Altered sleeping   0 0 0  Tired, decreased energy   3 2 1   Change in appetite   0 0 0  Feeling bad or failure about yourself    2 3 2   Trouble concentrating   0  3  Moving slowly or fidgety/restless   0 0 0  Suicidal thoughts   0 0 0  PHQ-9 Score   5 5 6   Difficult doing work/chores   Not difficult at all  Not difficult at all  Flowsheet Row Video Visit from 07/03/2022 in [facility] Video Visit from 04/17/2022 in [facility] from 02/08/2022 in [facility] Emergency Department at [facility]  C-SSRS RISK CATEGORY No Risk No Risk No Risk          Pt is aware that these meds carry a teratogenic risk. Pt will discuss plan of action if she does or plans to become pregnant in the future.  Status of current problems: stable   Medication management with supportive therapy. Risks and benefits, side effects and alternative treatment options discussed with patient. Pt was given an opportunity to ask questions about medication, illness, and treatment. All current psychiatric medications  have been reviewed and discussed with the patient and adjusted as clinically appropriate.  Pt verbalized understanding and verbal consent obtained for treatment.  Meds:  1. GAD (generalized anxiety disorder) - hydrOXYzine (ATARAX) 10 MG tablet; Take 1 tablet (10 mg total) by mouth daily as needed for anxiety.  Dispense: 90 tablet; Refill: 1 - mirtazapine (REMERON) 15 MG tablet; Take 1 tablet (15 mg total) by mouth at bedtime.  Dispense: 90 tablet; Refill: 1 - venlafaxine XR (EFFEXOR-XR) 75 MG 24 hr capsule; Take 3 capsules (225 mg total) by mouth daily with breakfast.  Dispense: 270 capsule; Refill: 1  2. Insomnia due to other mental disorder - hydrOXYzine (ATARAX) 10 MG tablet; Take 1 tablet (10 mg total) by mouth daily as needed for anxiety.  Dispense: 90 tablet; Refill: 1 - mirtazapine (REMERON) 15 MG tablet; Take 1 tablet (15 mg total) by mouth at bedtime.  Dispense: 90 tablet; Refill: 1  3. Severe episode of recurrent major depressive disorder, without psychotic features (HCC) - mirtazapine (REMERON) 15 MG tablet; Take 1 tablet (15 mg total) by mouth at bedtime.  Dispense: 90 tablet; Refill: 1 - venlafaxine XR (EFFEXOR-XR) 75 MG 24 hr capsule; Take 3 capsules (225 mg total) by mouth daily with breakfast.  Dispense: 270 capsule; Refill: 1     Labs: none    Therapy: brief supportive therapy provided. Discussed psychosocial stressors in detail.  Recommended she try out the Calm and Headspace apps for coping skills.   Collaboration of Care: Other none  Patient/Guardian was advised Release of Information must be obtained prior to any record release in order to collaborate their care with an outside provider. Patient/Guardian was advised if they have not already done so to contact the registration department to sign all necessary forms in order for [place] to release information regarding their care.   Consent: Patient/Guardian gives verbal consent for treatment and assignment of benefits for  services provided during this visit. Patient/Guardian expressed understanding and agreed to proceed.   Follow Up Instructions: Follow up in 5-6 months or sooner if needed with a new psychiatrist  Patient informed that I am leaving [facility] in 07/2022 and I relayed that they will be getting a new provider after that. Patient verbalized understanding and agreed with the plan.     I discussed the assessment and treatment plan with the patient. The patient was provided an opportunity to ask questions and all were answered. The patient agreed with the plan and demonstrated an understanding of the instructions.   The patient was advised to call back or seek an in-person evaluation if the symptoms worsen or if the condition fails to improve as anticipated.  I provided 18 minutes of non-face-to-face time during this encounter.   [name], MD

## 2022-07-23 DIAGNOSIS — I890 Lymphedema, not elsewhere classified: Secondary | ICD-10-CM | POA: Diagnosis not present

## 2022-07-23 DIAGNOSIS — G4733 Obstructive sleep apnea (adult) (pediatric): Secondary | ICD-10-CM | POA: Diagnosis not present

## 2022-07-27 ENCOUNTER — Other Ambulatory Visit: Payer: Self-pay | Admitting: Family Medicine

## 2022-07-28 NOTE — Telephone Encounter (Signed)
Pt is calling and dr [name] fogleman accept [facility] per pt dr Prudencio Pair phone number is [phone]. The referral needs to come for dr Casimiro Needle due to pt had abnormal pap with our office.

## 2022-08-26 ENCOUNTER — Telehealth: Payer: Self-pay | Admitting: Family Medicine

## 2022-08-26 NOTE — Telephone Encounter (Signed)
Pt says provider is does not have referral [id number]. Please resend.

## 2022-08-28 ENCOUNTER — Encounter (INDEPENDENT_AMBULATORY_CARE_PROVIDER_SITE_OTHER): Payer: Self-pay

## 2022-08-29 ENCOUNTER — Other Ambulatory Visit: Payer: Self-pay | Admitting: Oncology

## 2022-08-29 DIAGNOSIS — Z006 Encounter for examination for normal comparison and control in clinical research program: Secondary | ICD-10-CM

## 2022-09-05 NOTE — Telephone Encounter (Signed)
Resent

## 2022-09-17 ENCOUNTER — Ambulatory Visit: Payer: Medicaid Other | Admitting: Family Medicine

## 2022-09-17 ENCOUNTER — Encounter: Payer: Self-pay | Admitting: Family Medicine

## 2022-09-17 VITALS — BP 130/80 | HR 72 | Temp 98.2°F | Ht 65.0 in | Wt 203.0 lb

## 2022-09-17 DIAGNOSIS — L680 Hirsutism: Secondary | ICD-10-CM | POA: Diagnosis not present

## 2022-09-17 DIAGNOSIS — I10 Essential (primary) hypertension: Secondary | ICD-10-CM | POA: Diagnosis not present

## 2022-09-17 DIAGNOSIS — R635 Abnormal weight gain: Secondary | ICD-10-CM | POA: Diagnosis not present

## 2022-09-17 MED ORDER — IRBESARTAN 300 MG PO TABS: 300.0000 mg | ORAL_TABLET | Freq: Every day | ORAL | 5 refills | Status: DC

## 2022-09-17 NOTE — Patient Instructions (Addendum)
Total calories per day: 1800 calories  Total Carbs per day: try to stay around 70 grams or less  Total protein per day: try to shoot for 100 grams per day  Apps to try: Lose It! Or Carb manager

## 2022-09-17 NOTE — Progress Notes (Signed)
Established Patient Office Visit  Subjective   Patient ID: Nancy Barnes, female    DOB: 1970/10/13  Age: 52 y.o. MRN: [id number]  Chief Complaint  Patient presents with   Medical Management of Chronic Issues    Pt is here for 6 month follow up. She reports she has gained about 8 pounds since her last visit. States that she feels like she is also having more acne that has been going on for about 4-5 months. Reports she has not had a period since 2008 -- had a uterine ablation at that time, but has had some spotting since her last visit. States that she used to have hot flashes but they seem to have improved in the last 4-5 months.   Patient also reports increase  hair growth on her chin. Has not been able to get in to see GYN yet for the abormal pap, states that she has had trouble finding someone who will accept her insurance.     Current Outpatient Medications  Medication Instructions   albuterol (VENTOLIN HFA) 108 (90 Base) MCG/ACT inhaler INHALE 2 PUFFS BY MOUTH EVERY 4 HOURS AS NEEDED FOR WHEEZING AND FOR SHORTNESS OF BREATH   budesonide (PULMICORT) 0.5 mg, Nebulization, Daily   Cartia XT 120 mg, Oral, Daily   cetirizine (ZYRTEC) 10 mg, Oral, Daily   EPINEPHrine (EPI-PEN) 0.3 mg, Intramuscular, As needed   fluticasone-salmeterol (WIXELA INHUB) 250-50 MCG/ACT AEPB Inhale 1 puff then rinse mouth, twice daily   hydrOXYzine (ATARAX) 10 mg, Oral, Daily PRN   ipratropium (ATROVENT) 0.03 % nasal spray 2 sprays, Nasal, 2 times daily, 2 sprays each nostril twice daily   ipratropium-albuterol (DUONEB) 0.5-2.5 (3) MG/3ML SOLN 3 mLs, Nebulization, Every 6 hours PRN   irbesartan (AVAPRO) 300 mg, Oral, Daily   mirtazapine (REMERON) 15 mg, Oral, Daily at bedtime   omeprazole (PRILOSEC) 20 mg, Oral, Daily   venlafaxine XR (EFFEXOR-XR) 225 mg, Oral, Daily with breakfast    Patient Active Problem List   Diagnosis Date Noted   Postoperative breast asymmetry 01/31/2020   Hypertension  09/02/2019   OSA (obstructive sleep apnea) 06/17/2019   Back pain 04/29/2018   Neck pain 04/29/2018   Symptomatic mammary hypertrophy 04/29/2018   GAD (generalized anxiety disorder) 11/03/2013   Major psychotic depression, recurrent (HCC) 11/03/2013   Seasonal and perennial allergic rhinitis 02/29/2012   Food allergy 02/29/2012   Medication intolerance 02/29/2012   Asthma, moderate persistent 12/23/2011   Depression 12/09/2011   Anxiety 12/09/2011      Review of Systems  All other systems reviewed and are negative.     Objective:     BP 130/80 (BP Location: Left Arm, Patient Position: Sitting, Cuff Size: Normal)   Pulse 72   Temp 98.2 F (36.8 C) (Oral)   Ht 5\' 5"  (1.651 m)   Wt 203 lb (92.1 kg)   SpO2 99%   BMI 33.78 kg/m    Physical Exam Vitals reviewed.  Constitutional:      Appearance: Normal appearance. She is obese.  Cardiovascular:     Rate and Rhythm: Normal rate and regular rhythm.     Pulses: Normal pulses.  Pulmonary:     Effort: Pulmonary effort is normal.     Breath sounds: Normal breath sounds. No wheezing.  Neurological:     General: No focal deficit present.     Mental Status: She is alert and oriented to person, place, and time.  Psychiatric:        Mood  and Affect: Mood normal.        Behavior: Behavior normal.      No results found for any visits on 09/17/22.    The 10-year ASCVD risk score (Arnett DK, et al., 2019) is: 1.9%    Assessment & Plan:  Hirsutism Pt does not have periods to determine if she has developed PCOS or if this is postmenopausal, will check hormone labs to look for this.  -     Testosterone; Future  Weight gain -     Cortisol; Future -     Estradiol; Future -     Luteinizing hormone; Future -     Follicle stimulating hormone; Future  Primary hypertension Assessment & Plan: Current hypertension medications:       Sig   diltiazem (CARTIA XT) 120 MG 24 hr capsule (Taking) Take 1 capsule by mouth once  daily   irbesartan (AVAPRO) 300 MG tablet Take 1 tablet (300 mg total) by mouth daily.      Chronic, stable, BP is well controlled on the medication listed above, will refill her irbesartan today.   Orders: -     Irbesartan; Take 1 tablet (300 mg total) by mouth daily.  Dispense: 30 tablet; Refill: 5     Return in about 6 months (around 03/17/2023) for HTN, annual physical exam.    Karie Georges, MD

## 2022-09-19 NOTE — Assessment & Plan Note (Signed)
Current hypertension medications:       Sig   diltiazem (CARTIA XT) 120 MG 24 hr capsule (Taking) Take 1 capsule by mouth once daily   irbesartan (AVAPRO) 300 MG tablet Take 1 tablet (300 mg total) by mouth daily.      Chronic, stable, BP is well controlled on the medication listed above, will refill her irbesartan today.

## 2022-09-24 ENCOUNTER — Other Ambulatory Visit (HOSPITAL_COMMUNITY)
Admission: RE | Admit: 2022-09-24 | Discharge: 2022-09-24 | Disposition: A | Discharge status: Home / Self Care | Payer: Medicaid Other | Source: Ambulatory Visit | Attending: Family Medicine | Admitting: Family Medicine

## 2022-09-24 DIAGNOSIS — R635 Abnormal weight gain: Secondary | ICD-10-CM | POA: Diagnosis present

## 2022-09-24 DIAGNOSIS — L680 Hirsutism: Secondary | ICD-10-CM | POA: Diagnosis present

## 2022-09-24 LAB — CORTISOL: Cortisol, Plasma: 9.8 ug/dL

## 2022-09-25 LAB — ESTRADIOL: Estradiol: 9.6 pg/mL

## 2022-09-25 LAB — TESTOSTERONE: Testosterone: 42 ng/dL (ref 4–50)

## 2022-09-25 LAB — FOLLICLE STIMULATING HORMONE: FSH: 51.4 m[IU]/mL

## 2022-09-25 LAB — LUTEINIZING HORMONE: LH: 43.5 m[IU]/mL

## 2022-11-26 ENCOUNTER — Other Ambulatory Visit: Payer: Self-pay | Admitting: Family Medicine

## 2022-12-10 DIAGNOSIS — G4733 Obstructive sleep apnea (adult) (pediatric): Secondary | ICD-10-CM | POA: Diagnosis not present

## 2023-01-05 ENCOUNTER — Telehealth (HOSPITAL_COMMUNITY): Payer: Medicaid Other | Admitting: Psychiatry

## 2023-01-05 ENCOUNTER — Encounter (HOSPITAL_COMMUNITY): Payer: Self-pay | Admitting: Psychiatry

## 2023-01-05 VITALS — Wt 191.0 lb

## 2023-01-05 DIAGNOSIS — F411 Generalized anxiety disorder: Secondary | ICD-10-CM | POA: Diagnosis not present

## 2023-01-05 DIAGNOSIS — F332 Major depressive disorder, recurrent severe without psychotic features: Secondary | ICD-10-CM

## 2023-01-05 DIAGNOSIS — F99 Mental disorder, not otherwise specified: Secondary | ICD-10-CM

## 2023-01-05 DIAGNOSIS — F5105 Insomnia due to other mental disorder: Secondary | ICD-10-CM

## 2023-01-05 MED ORDER — VENLAFAXINE HCL ER 75 MG PO CP24
225.0000 mg | ORAL_CAPSULE | Freq: Every day | ORAL | 0 refills | Status: DC
Start: 2023-01-05 — End: 2023-04-06
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
## 2023-01-26 DIAGNOSIS — G4733 Obstructive sleep apnea (adult) (pediatric): Secondary | ICD-10-CM | POA: Diagnosis not present

## 2023-03-17 ENCOUNTER — Ambulatory Visit (INDEPENDENT_AMBULATORY_CARE_PROVIDER_SITE_OTHER): Payer: Medicaid Other | Admitting: Family Medicine

## 2023-03-17 ENCOUNTER — Encounter: Payer: Self-pay | Admitting: Family Medicine

## 2023-03-17 VITALS — BP 140/90 | HR 75 | Temp 98.5°F | Ht 65.0 in | Wt 189.1 lb

## 2023-03-17 DIAGNOSIS — R87619 Unspecified abnormal cytological findings in specimens from cervix uteri: Secondary | ICD-10-CM

## 2023-03-17 DIAGNOSIS — Z0000 Encounter for general adult medical examination without abnormal findings: Secondary | ICD-10-CM | POA: Diagnosis not present

## 2023-03-17 DIAGNOSIS — Z13220 Encounter for screening for lipoid disorders: Secondary | ICD-10-CM

## 2023-03-17 DIAGNOSIS — I10 Essential (primary) hypertension: Secondary | ICD-10-CM | POA: Diagnosis not present

## 2023-03-17 DIAGNOSIS — M109 Gout, unspecified: Secondary | ICD-10-CM | POA: Diagnosis not present

## 2023-03-17 LAB — LIPID PANEL
Cholesterol: 231 mg/dL — ABNORMAL HIGH (ref 0–200)
HDL: 58 mg/dL (ref >39.00)
LDL Cholesterol: 152 mg/dL — ABNORMAL HIGH (ref 0–99)
NonHDL: 173.09
Total CHOL/HDL Ratio: 4
Triglycerides: 105 mg/dL (ref 0.0–149.0)
VLDL: 21 mg/dL (ref 0.0–40.0)

## 2023-03-17 LAB — COMPREHENSIVE METABOLIC PANEL
ALT: 27 U/L (ref 0–35)
AST: 20 U/L (ref 0–37)
Albumin: 4.3 g/dL (ref 3.5–5.2)
Alkaline Phosphatase: 142 U/L — ABNORMAL HIGH (ref 39–117)
BUN: 9 mg/dL (ref 6–23)
CO2: 30 meq/L (ref 19–32)
Calcium: 9.3 mg/dL (ref 8.4–10.5)
Chloride: 101 meq/L (ref 96–112)
Creatinine, Ser: 0.78 mg/dL (ref 0.40–1.20)
GFR: 87.85 mL/min (ref >60.00)
Glucose, Bld: 98 mg/dL (ref 70–99)
Potassium: 3.6 meq/L (ref 3.5–5.1)
Sodium: 140 meq/L (ref 135–145)
Total Bilirubin: 0.9 mg/dL (ref 0.2–1.2)
Total Protein: 7.7 g/dL (ref 6.0–8.3)

## 2023-03-17 LAB — URIC ACID: Uric Acid, Serum: 6.4 mg/dL (ref 2.4–7.0)

## 2023-03-17 MED ORDER — IRBESARTAN 300 MG PO TABS
300.0000 mg | ORAL_TABLET | Freq: Every day | ORAL | 5 refills | Status: DC
Start: 2023-03-17 — End: 2023-09-17

## 2023-03-17 MED ORDER — DILTIAZEM HCL ER COATED BEADS 120 MG PO CP24
120.0000 mg | ORAL_CAPSULE | Freq: Every day | ORAL | 5 refills | Status: DC
Start: 2023-03-17 — End: 2023-09-17

## 2023-03-17 NOTE — Progress Notes (Signed)
 Complete physical exam  Patient: Nancy Barnes   DOB: June 21, 1970   53 y.o. Female  MRN: [id number]  Subjective:    Chief Complaint  Patient presents with   Medical Management of Chronic Issues    Nancy Barnes is a 53 y.o. female who presents today for a complete physical exam. She reports consuming a general diet. Has shellfish allergy as well as pecans and walnuts Gym/ health club routine includes light weights, stationary bike, and treadmill. She generally feels well. She reports sleeping well. She does not have additional problems to discuss today.    Most recent fall risk assessment:     No data to display           Most recent depression screenings:    03/17/2023    9:38 AM 09/17/2022    9:17 AM  PHQ 2/9 Scores  PHQ - 2 Score 0 0  PHQ- 9 Score 3 6    Vision:Within last year and Dental: No current dental problems and Last dental visit: 6 months ago, does not have a regular dentist  Patient Active Problem List   Diagnosis Date Noted   Postoperative breast asymmetry 01/31/2020   Hypertension 09/02/2019   OSA (obstructive sleep apnea) 06/17/2019   Back pain 04/29/2018   Neck pain 04/29/2018   Symptomatic mammary hypertrophy 04/29/2018   GAD (generalized anxiety disorder) 11/03/2013   Major psychotic depression, recurrent (HCC) 11/03/2013   Seasonal and perennial allergic rhinitis 02/29/2012   Food allergy 02/29/2012   Medication intolerance 02/29/2012   Asthma, moderate persistent 12/23/2011   Depression 12/09/2011   Anxiety 12/09/2011      Patient Care Team: Karie Georges, MD as PCP - General (Family Medicine) Orbie Pyo, MD as PCP - Cardiology (Cardiology) Fransico Setters, Tennis Must, MD as Referring Physician (Allergy and Immunology) Michaelle Copas, MD as Referring Physician (Optometry) Rhueland Hawthorne (Obstetrics and Gynecology)   Outpatient Medications Prior to Visit  Medication Sig   albuterol (VENTOLIN HFA) 108 (90 Base) MCG/ACT  inhaler INHALE 2 PUFFS BY MOUTH EVERY 4 HOURS AS NEEDED FOR WHEEZING AND FOR SHORTNESS OF BREATH   budesonide (PULMICORT) 0.5 MG/2ML nebulizer solution Take 2 mLs (0.5 mg total) by nebulization daily. (Patient taking differently: Take 0.5 mg by nebulization daily as needed (shortness of breath (winter months)).)   cetirizine (ZYRTEC) 10 MG chewable tablet Chew 10 mg by mouth daily.   EPINEPHrine 0.3 mg/0.3 mL IJ SOAJ injection Inject 0.3 mg into the muscle as needed for anaphylaxis.   fluticasone-salmeterol (WIXELA INHUB) 250-50 MCG/ACT AEPB Inhale 1 puff then rinse mouth, twice daily   hydrOXYzine (ATARAX) 10 MG tablet Take 1 tablet (10 mg total) by mouth daily as needed for anxiety.   ipratropium (ATROVENT) 0.03 % nasal spray Place 2 sprays into the nose 2 (two) times daily. 2 sprays each nostril twice daily   ipratropium-albuterol (DUONEB) 0.5-2.5 (3) MG/3ML SOLN Take 3 mLs by nebulization every 6 (six) hours as needed (shortness of breath/wheezing.).   omeprazole (PRILOSEC) 20 MG capsule Take 20 mg by mouth daily.   venlafaxine XR (EFFEXOR-XR) 75 MG 24 hr capsule Take 3 capsules (225 mg total) by mouth daily with breakfast.   [DISCONTINUED] diltiazem (CARDIZEM CD) 120 MG 24 hr capsule Take 1 capsule by mouth once daily   [DISCONTINUED] irbesartan (AVAPRO) 300 MG tablet Take 1 tablet (300 mg total) by mouth daily.   [DISCONTINUED] mirtazapine (REMERON) 15 MG tablet Take 1 tablet (15 mg total) by mouth  at bedtime. (Patient not taking: Reported on 01/05/2023)   No facility-administered medications prior to visit.    Review of Systems  HENT:  Negative for hearing loss.   Eyes:  Negative for blurred vision.  Respiratory:  Negative for shortness of breath.   Cardiovascular:  Negative for chest pain.  Gastrointestinal: Negative.   Genitourinary: Negative.   Musculoskeletal:  Negative for back pain.  Neurological:  Negative for headaches.  Psychiatric/Behavioral:  Negative for depression.         Objective:     BP (!) 140/90   Pulse 75   Temp 98.5 F (36.9 C) (Oral)   Ht 5\' 5"  (1.651 m)   Wt 189 lb 1.6 oz (85.8 kg)   SpO2 98%   BMI 31.47 kg/m    Physical Exam Vitals reviewed.  Constitutional:      Appearance: Normal appearance. She is well-groomed and normal weight.  HENT:     Right Ear: Tympanic membrane and ear canal normal.     Left Ear: Tympanic membrane and ear canal normal.     Mouth/Throat:     Mouth: Mucous membranes are moist.     Pharynx: No posterior oropharyngeal erythema.  Eyes:     Conjunctiva/sclera: Conjunctivae normal.  Neck:     Thyroid: No thyromegaly.  Cardiovascular:     Rate and Rhythm: Normal rate and regular rhythm.     Pulses: Normal pulses.     Heart sounds: S1 normal and S2 normal.  Pulmonary:     Effort: Pulmonary effort is normal.     Breath sounds: Normal breath sounds and air entry.  Abdominal:     General: Abdomen is flat. Bowel sounds are normal.     Palpations: Abdomen is soft.  Musculoskeletal:     Right lower leg: No edema.     Left lower leg: No edema.  Lymphadenopathy:     Cervical: No cervical adenopathy.  Neurological:     Mental Status: She is alert and oriented to person, place, and time. Mental status is at baseline.     Gait: Gait is intact.  Psychiatric:        Mood and Affect: Mood and affect normal.        Speech: Speech normal.        Behavior: Behavior normal.        Judgment: Judgment normal.      No results found for any visits on 03/17/23.     Assessment & Plan:    Routine Health Maintenance and Physical Exam  Immunization History  Administered Date(s) Administered   Influenza Split 12/10/2011   Influenza,inj,Quad PF,6+ Mos 10/15/2012, 09/23/2019   Influenza-Unspecified 01/17/2023   Moderna Sars-Covid-2 Vaccination 01/20/2019, 03/01/2019, 01/14/2020   Tdap 09/26/2015    Health Maintenance  Topic Date Due   Pneumococcal Vaccine 62-17 Years old (1 of 2 - PCV) Never done    Zoster Vaccines- Shingrix (1 of 2) Never done   COVID-19 Vaccine (4 - 2024-25 season) 09/14/2022   Hepatitis C Screening  09/17/2023 (Originally 08/22/1989)   HIV Screening  09/17/2023 (Originally 08/23/1986)   MAMMOGRAM  04/28/2024   Fecal DNA (Cologuard)  10/03/2024   DTaP/Tdap/Td (2 - Td or Tdap) 09/25/2025   Cervical Cancer Screening (HPV/Pap Cotest)  03/17/2027   INFLUENZA VACCINE  Completed   HPV VACCINES  Aged Out    Discussed health benefits of physical activity, and encouraged her to engage in regular exercise appropriate for her age and condition.  Routine adult  health maintenance       -   Normal physical exam findings today, I counseled the patient on healthy eating and exercise, handouts given as well. Labs ordered for annual surveillance.  Primary hypertension -     dilTIAZem HCl ER Coated Beads; Take 1 capsule (120 mg total) by mouth daily.  Dispense: 30 capsule; Refill: 5 -     Irbesartan; Take 1 tablet (300 mg total) by mouth daily.  Dispense: 30 tablet; Refill: 5 -     Comprehensive metabolic panel; Future  Abnormal cytological finding in specimen from cervix -     Ambulatory referral to Gynecology  Lipid screening -     Lipid panel; Future  Gout involving toe of left foot, unspecified cause, unspecified chronicity -     Uric acid; Future    Return in about 6 months (around 09/17/2023) for HTN.     Karie Georges, MD

## 2023-03-18 ENCOUNTER — Encounter: Payer: Self-pay | Admitting: Family Medicine

## 2023-03-27 ENCOUNTER — Ambulatory Visit: Payer: Medicaid Other | Admitting: Internal Medicine

## 2023-04-06 ENCOUNTER — Encounter (HOSPITAL_COMMUNITY): Payer: Self-pay | Admitting: Psychiatry

## 2023-04-06 ENCOUNTER — Telehealth (HOSPITAL_BASED_OUTPATIENT_CLINIC_OR_DEPARTMENT_OTHER): Payer: Medicaid Other | Admitting: Psychiatry

## 2023-04-06 VITALS — Wt 191.0 lb

## 2023-04-06 DIAGNOSIS — F99 Mental disorder, not otherwise specified: Secondary | ICD-10-CM

## 2023-04-06 DIAGNOSIS — F411 Generalized anxiety disorder: Secondary | ICD-10-CM | POA: Diagnosis not present

## 2023-04-06 DIAGNOSIS — F5105 Insomnia due to other mental disorder: Secondary | ICD-10-CM | POA: Diagnosis not present

## 2023-04-06 DIAGNOSIS — F332 Major depressive disorder, recurrent severe without psychotic features: Secondary | ICD-10-CM

## 2023-04-06 MED ORDER — VENLAFAXINE HCL ER 75 MG PO CP24
225.0000 mg | ORAL_CAPSULE | Freq: Every day | ORAL | 0 refills | Status: AC
Start: 2023-04-06 — End: ?

## 2023-04-06 MED ORDER — HYDROXYZINE HCL 25 MG PO TABS
25.0000 mg | ORAL_TABLET | Freq: Every day | ORAL | 0 refills | Status: AC | PRN
Start: 2023-04-06 — End: ?
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
## 2023-04-16 ENCOUNTER — Encounter: Payer: Self-pay | Admitting: Family Medicine

## 2023-05-27 NOTE — Progress Notes (Signed)
 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 Medications (nonsteroidal anti-inflammatory drugs, Toradol) caused facial caused facial swelling/angioedema. Foods-pecan, walnut caused itchiness and sores in mouth.             Shrimp, crab caused chest tightness 3 years ago. Seasonal allergic rhinitis- pollens and cats. Latex-rash Allergy  Skin Test- 05/19/12- POS especially grass, tree, dust mite, cockroach. 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 Sleeping well with CPAP. Some increase in chest tightness with recent pollen, needing inhaler refills.  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  History of Present Illness   Nancy Barnes is a 53 year old female with asthma and OSA who presents with wheezing.  She experiences wheezing today, possibly due  to weather changes or pollen levels, despite a recent decrease in pollen and rain. She uses Wixela and an albuterol  inhaler but has not used her nebulizer due to a lack of solution. Her albuterol  inhaler occasionally malfunctions, requiring disassembly to work properly. She describes persistent chest tightness with an itchy quality. She had seen her PCP occasionally with mild wheeze. Usually using rescue inhaler only occasionally. She has been erratic about CPAP use. Compliance and medical goals emphasized today.     Assessment and Plan:   Asthma mild intermittent with complicaton Asthma Asthma with wheezing, possibly weather-related. Current management with Wixela and albuterol  inhaler. Issues with albuterol  inhaler reported. - Refill nebulizer solution if needed. - Advised consultation with pharmacist regarding albuterol  inhaler issues. - Instructed to report if current medications do not manage symptoms adequately.  Obstructive Sleep Apnea Difficulty obtaining CPAP supplies, inadequate communication from DME supplier. - Request patient care coordinators to switch to a different DME supplier per patient request. - Instructed to inform the office if issues persist with obtaining CPAP supplies.     ROS-see HPI   + = positive Constitutional:   No-   weight loss, night sweats, fevers, chills, fatigue, lassitude. HEENT:   No-  headaches, difficulty swallowing, tooth/dental problems, sore throat,      +sneezing, itching, ear ache, +nasal congestion, post nasal drip,  CV:  No-   chest pain, orthopnea, PND, swelling in lower extremities, anasarca, dizziness, +palpitations Resp: + shortness of breath with exertion or at rest.             + productive cough,  No non-productive cough,  No- coughing up of blood.              No-   change in color of mucus.  + wheezing.   Skin: No-  rash or lesions. GI:  + heartburn, indigestion, abdominal pain, nausea, vomiting,  GU: . MS:  No-   joint pain or  swelling.   Neuro-     nothing unusual Psych:  No- change in mood or affect. + depression or +anxiety.  No memory loss.  OBJ- Physical Exam General- Alert, Oriented, Affect-appropriate, Distress- none acute Skin- rash-none, lesions- none, excoriation- none Lymphadenopathy- none Head- atraumatic            Eyes- Gross vision intact, PERRLA, conjunctivae and secretions clear            Ears- Hearing, canals-normal            Nose- clear, no-Septal dev, mucus, polyps, erosion, perforation             Throat- Mallampati II , mucosa clear , drainage- none, tonsils- atrophic Neck- flexible , trachea midline, no stridor , thyroid nl, carotid no bruit Chest - symmetrical excursion , unlabored           Heart/CV- RRR , no murmur , no gallop  , no rub, nl s1 s2                           - JVD- none , edema- none, stasis changes- none, varices- none           Lung-  wheeze-not heard today,  cough-none , dullness-none, rub- none           Chest wall-  Abd-  Br/ Gen/ Rectal- Not done, not indicated Extrem- cyanosis- none, clubbing, none, atrophy- none, strength- nl Neuro- grossly intact to observation

## 2023-05-28 ENCOUNTER — Ambulatory Visit: Admitting: Internal Medicine

## 2023-05-28 ENCOUNTER — Encounter: Payer: Self-pay | Admitting: Internal Medicine

## 2023-05-28 VITALS — BP 132/100 | HR 77 | Ht 65.0 in | Wt 189.0 lb

## 2023-05-28 DIAGNOSIS — G4733 Obstructive sleep apnea (adult) (pediatric): Secondary | ICD-10-CM | POA: Diagnosis not present

## 2023-05-28 NOTE — Patient Instructions (Signed)
 Order- [facility]- patient requests change of DME from [facility] to continue CPAP auto 5-20, mask of choice, heated humidity, supplies, AirView/ card. [employer] has not been sending supplies when requested.

## 2023-06-07 ENCOUNTER — Other Ambulatory Visit (HOSPITAL_COMMUNITY): Payer: Self-pay | Admitting: Psychiatry

## 2023-06-07 ENCOUNTER — Other Ambulatory Visit: Payer: Self-pay | Admitting: Internal Medicine

## 2023-06-07 DIAGNOSIS — F411 Generalized anxiety disorder: Secondary | ICD-10-CM

## 2023-06-07 DIAGNOSIS — J4540 Moderate persistent asthma, uncomplicated: Secondary | ICD-10-CM

## 2023-06-07 DIAGNOSIS — F332 Major depressive disorder, recurrent severe without psychotic features: Secondary | ICD-10-CM

## 2023-06-17 ENCOUNTER — Encounter: Payer: Self-pay | Admitting: Internal Medicine

## 2023-06-24 ENCOUNTER — Other Ambulatory Visit (HOSPITAL_COMMUNITY): Payer: Self-pay | Admitting: Psychiatry

## 2023-06-24 DIAGNOSIS — F5105 Insomnia due to other mental disorder: Secondary | ICD-10-CM

## 2023-06-24 DIAGNOSIS — F411 Generalized anxiety disorder: Secondary | ICD-10-CM

## 2023-06-25 ENCOUNTER — Telehealth (HOSPITAL_COMMUNITY): Admitting: Psychiatry

## 2023-06-26 ENCOUNTER — Encounter (HOSPITAL_COMMUNITY): Payer: Self-pay | Admitting: Psychiatry

## 2023-06-26 ENCOUNTER — Telehealth (HOSPITAL_BASED_OUTPATIENT_CLINIC_OR_DEPARTMENT_OTHER): Admitting: Psychiatry

## 2023-06-26 DIAGNOSIS — F5105 Insomnia due to other mental disorder: Secondary | ICD-10-CM | POA: Diagnosis not present

## 2023-06-26 DIAGNOSIS — F99 Mental disorder, not otherwise specified: Secondary | ICD-10-CM

## 2023-06-26 DIAGNOSIS — F411 Generalized anxiety disorder: Secondary | ICD-10-CM | POA: Diagnosis not present

## 2023-06-26 DIAGNOSIS — F332 Major depressive disorder, recurrent severe without psychotic features: Secondary | ICD-10-CM | POA: Diagnosis not present

## 2023-06-26 MED ORDER — HYDROXYZINE HCL 25 MG PO TABS
25.0000 mg | ORAL_TABLET | Freq: Every day | ORAL | 0 refills | Status: DC | PRN
Start: 2023-06-26 — End: 2023-09-25

## 2023-06-26 MED ORDER — VENLAFAXINE HCL ER 75 MG PO CP24
225.0000 mg | ORAL_CAPSULE | Freq: Every day | ORAL | 0 refills | Status: DC
Start: 2023-06-26 — End: 2023-09-25
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
## 2023-06-29 ENCOUNTER — Encounter: Payer: Self-pay | Admitting: Obstetrics and Gynecology

## 2023-06-29 ENCOUNTER — Ambulatory Visit (INDEPENDENT_AMBULATORY_CARE_PROVIDER_SITE_OTHER): Admitting: Obstetrics and Gynecology

## 2023-06-29 VITALS — BP 124/76 | HR 82

## 2023-06-29 DIAGNOSIS — K5900 Constipation, unspecified: Secondary | ICD-10-CM

## 2023-06-29 DIAGNOSIS — R87619 Unspecified abnormal cytological findings in specimens from cervix uteri: Secondary | ICD-10-CM | POA: Diagnosis not present

## 2023-06-29 DIAGNOSIS — Z8049 Family history of malignant neoplasm of other genital organs: Secondary | ICD-10-CM | POA: Diagnosis not present

## 2023-06-29 DIAGNOSIS — N950 Postmenopausal bleeding: Secondary | ICD-10-CM | POA: Diagnosis not present

## 2023-06-29 DIAGNOSIS — N951 Menopausal and female climacteric states: Secondary | ICD-10-CM

## 2023-06-29 NOTE — Progress Notes (Unsigned)
 GYNECOLOGY  VISIT   HPI: 53 y.o.   Divorced  Caucasian female   G2P0 with No LMP recorded. Patient has had an ablation.   here for: Referral for abnormal pap.  Pap 03/17/2022 showed epithelial atypia, neg HR HPV.       Hx endometrial ablation.  Noticed some vaginal bright bleeding about 3 months ago.   Sister with cervical and uterine cancer. Tx with surgery and XRT.  Two maternal aunts dx with uterine cancer.    Several maternal cousins and maternal great aunt with breast cancer.  No ovarian, colon, or prostate cancer.   Has some hot flashes.  Labs 09/24/22:  FSH 51.4, estradiol  9.6.   Splinting to have bowel movements.   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   GYNECOLOGIC HISTORY: No LMP recorded. Patient has had an ablation. Contraception:  Ablation 2007/2008 Menopausal hormone therapy:  n/a Last 2 paps:  03/17/22 Atrophic pattern with epithelial atypia, HR HPV neg,  History of abnormal Pap or positive HPV:  yes Mammogram:  04/29/22 Breast Density Cat A, BIRADS Cat 1 neg         OB History     Gravida  2   Para      Term      Preterm      AB      Living  2      SAB      IAB      Ectopic      Multiple      Live Births  2              Patient Active Problem List   Diagnosis Date Noted   Postoperative breast asymmetry 01/31/2020   Hypertension 09/02/2019   OSA (obstructive sleep apnea) 06/17/2019   Back pain 04/29/2018   Neck pain 04/29/2018   Symptomatic mammary hypertrophy 04/29/2018   GAD (generalized anxiety disorder) 11/03/2013   Major psychotic depression, recurrent (HCC) 11/03/2013   Seasonal and perennial allergic rhinitis 02/29/2012   Food allergy  02/29/2012   Medication intolerance 02/29/2012   Asthma, moderate persistent 12/23/2011   Depression 12/09/2011   Anxiety 12/09/2011    Past Medical History:  Diagnosis Date   Anxiety    Asthma    Chicken pox    Complication of anesthesia    unable to tolerate NSAIDS    Depression    Environmental allergies    food/medication   Family history of adverse reaction to anesthesia    mother skin glue allergy  post surgery    GERD (gastroesophageal reflux disease)    Headache(784.0)    Hypertension    Hypokalemia    Pneumomediastinum (HCC)    Seasonal allergies    Sleep apnea    Suicidal ideations    UTI (urinary tract infection)     Past Surgical History:  Procedure Laterality Date   ABLATION     endometrial ablation in 2007 or 2008   BREAST BIOPSY  04/14/2011   BREAST REDUCTION SURGERY Bilateral 09/14/2019   Procedure: BILATERAL BREAST REDUCTION WITH LIPOSUCTION;  Surgeon: Thornell Flirt, DO;  Location: [facility];  Service: Plastics;  Laterality: Bilateral;  3 hours   REDUCTION MAMMAPLASTY     TUBAL LIGATION      Current Outpatient Medications  Medication Sig Dispense Refill   ADVAIR  DISKUS 250-50 MCG/ACT AEPB INHALE 1 DOSE BY MOUTH TWICE DAILY, THEN RINSE MOUTH 60 each 11   budesonide  (PULMICORT ) 0.5 MG/2ML nebulizer solution Take  2 mLs (0.5 mg total) by nebulization daily. (Patient taking differently: Take 0.5 mg by nebulization daily as needed (shortness of breath (winter months)).) 60 mL 5   cetirizine (ZYRTEC) 10 MG chewable tablet Chew 10 mg by mouth daily.     diltiazem  (CARDIZEM  CD) 120 MG 24 hr capsule Take 1 capsule (120 mg total) by mouth daily. 30 capsule 5   EPINEPHrine  0.3 mg/0.3 mL IJ SOAJ injection Inject 0.3 mg into the muscle as needed for anaphylaxis. 1 each 5   hydrOXYzine  (ATARAX ) 25 MG tablet Take 1 tablet (25 mg total) by mouth daily as needed for anxiety. 60 tablet 0   ipratropium (ATROVENT ) 0.03 % nasal spray Place 2 sprays into the nose 2 (two) times daily. 2 sprays each nostril twice daily 30 mL 5   ipratropium-albuterol  (DUONEB) 0.5-2.5 (3) MG/3ML SOLN Take 3 mLs by nebulization every 6 (six) hours as needed (shortness of breath/wheezing.). 360 mL 5   irbesartan  (AVAPRO ) 300 MG tablet Take 1 tablet  (300 mg total) by mouth daily. 30 tablet 5   omeprazole (PRILOSEC) 20 MG capsule Take 20 mg by mouth daily.     venlafaxine  XR (EFFEXOR -XR) 75 MG 24 hr capsule Take 3 capsules (225 mg total) by mouth daily with breakfast. 270 capsule 0   VENTOLIN  HFA 108 (90 Base) MCG/ACT inhaler INHALE 2 PUFFS BY MOUTH EVERY 4 HOURS AS NEEDED FOR WHEEZING AND FOR SHORTNESS OF BREATH 18 g 11   No current facility-administered medications for this visit.     ALLERGIES: Nsaids, Shellfish allergy , Abilify [aripiprazole], Amlodipine, Amoxicillin, Fish allergy , Latuda [lurasidone hcl], Levaquin [levofloxacin in d5w], Other, Penicillins, Prednisone, Seroquel [quetiapine fumarate], Ativan  [lorazepam ], Sulfa antibiotics, and Xanax [alprazolam]  Family History  Problem Relation Age of Onset   Heart attack Mother 56       Bypass x5   Allergies Mother    CAD Mother    High blood pressure Mother    High Cholesterol Mother    Heart disease Mother    Arthritis Mother    Heart failure Mother    Diabetes Mellitus II Father    Hypertension Father    Heart disease Father    CAD Father    Other Father        Agent orange exposure   Arthritis Father    Depression Father    Atrial fibrillation Father    Cancer Sister        Uterine and cervical cancer   Breast cancer Maternal Aunt    Allergies Maternal Grandmother    Atrial fibrillation Maternal Grandmother    Stroke Maternal Grandmother    Asthma Maternal Grandfather    Asthma Paternal Grandmother    Asthma Paternal Grandfather    Allergies Daughter    Psoriasis Daughter    Arthritis Daughter        psoriatic   Hypothyroidism Daughter    Breast cancer Other    Suicidality Neg Hx    Anxiety disorder Neg Hx     Social History   Socioeconomic History   Marital status: Divorced    Spouse name: Not on file   Number of children: Not on file   Years of education: Not on file   Highest education level: Associate degree: occupational, Scientist, product/process development, or  vocational program  Occupational History   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 Lives at home with 2 teenage children. Never smoker.   Social Drivers of Health   Financial Resource Strain: High Risk (03/16/2023)   Overall Financial Resource Strain (CARDIA)    Difficulty of Paying Living Expenses: Hard  Food Insecurity: Food Insecurity Present (03/16/2023)   Hunger Vital Sign    Worried About Running Out of Food in the Last Year: Sometimes true    Ran Out of Food in the Last Year: Sometimes true  Transportation Needs: No Transportation Needs (03/16/2023)   PRAPARE - Administrator, Civil Service (Medical): No    Lack of Transportation (Non-Medical): No  Physical Activity: Sufficiently Active (03/16/2023)   Exercise Vital Sign    Days of Exercise per Week: 3 days    Minutes of Exercise per Session: 70 min  Stress: No Stress Concern Present (03/16/2023)   [employer] of Occupational Health - Occupational Stress Questionnaire    Feeling of Stress : Not at all  Social Connections: Moderately Integrated (03/16/2023)   Social Connection and Isolation Panel    Frequency of Communication with Friends and Family: Three times a week    Frequency of Social Gatherings with Friends and Family: More than three times a week    Attends Religious Services: More than 4 times per year    Active Member of [employer] or Organizations: Yes    Attends  Banker Meetings: More than 4 times per year    Marital Status: Divorced  Intimate Partner Violence: Unknown (04/17/2021)   Received from [facility]   HITS    Physically Hurt: Not on file    Insult or Talk Down To: Not on file    Threaten Physical Harm: Not on file    Scream or Curse: Not on file    Review of Systems  All other systems reviewed and are negative.   PHYSICAL EXAMINATION:   BP 124/76 (BP Location: Left Arm, Patient Position: Sitting)   Pulse 82   SpO2 97%     General appearance: alert, cooperative and appears stated age Head: Normocephalic, without obvious abnormality, atraumatic Neck: no adenopathy, supple, symmetrical, trachea midline and thyroid normal to inspection and palpation Lungs: clear to auscultation bilaterally Breasts: normal appearance, no masses or tenderness, No nipple retraction or dimpling, No nipple discharge or bleeding, No axillary or supraclavicular adenopathy Heart: regular rate and rhythm Abdomen: soft, non-tender, no masses,  no organomegaly Extremities: extremities normal, atraumatic, no cyanosis or edema Skin: Skin color, texture, turgor normal. No rashes or lesions Lymph nodes: Cervical, supraclavicular, and axillary nodes normal. No abnormal inguinal nodes palpated Neurologic: Grossly normal  Pelvic: External genitalia:  no lesions              Urethra:  normal appearing urethra with no masses, tenderness or lesions              Bartholins and Skenes: normal                 Vagina: normal appearing vagina with normal  color and discharge, no lesions              Cervix: nabothian cysts.                 Bimanual Exam:  Uterus:  normal size, contour, position, consistency, mobility, non-tender              Adnexa: no mass, fullness, tenderness              Rectal exam: {yes no:314532}.  Confirms.              Anus:  normal sphincter tone, no lesions  Chaperone was present for exam:  {BSCHAPERONE:31226::Emily F,  CMA}  ASSESSMENT:  Hx abnormal pap, epithelial atypia.  Cervical nabothian cysts.  Hx endometrial ablation.  Sounds like Novasure.  Menopausal symptoms.  Depression. On Effexor .  FH uterine cancer.  Splinting to have BMs.   PLAN:  [place]. Labs upon return visit with US .   {LABS (Optional):23779}  ***  total time was spent for this patient encounter, including preparation, face-to-face counseling with the patient, coordination of care, and documentation of the encounter.

## 2023-06-30 ENCOUNTER — Other Ambulatory Visit (HOSPITAL_COMMUNITY)
Admission: RE | Admit: 2023-06-30 | Discharge: 2023-06-30 | Disposition: A | Discharge status: Home / Self Care | Source: Ambulatory Visit | Attending: Obstetrics and Gynecology | Admitting: Obstetrics and Gynecology

## 2023-06-30 DIAGNOSIS — N950 Postmenopausal bleeding: Secondary | ICD-10-CM | POA: Insufficient documentation

## 2023-06-30 DIAGNOSIS — R87619 Unspecified abnormal cytological findings in specimens from cervix uteri: Secondary | ICD-10-CM | POA: Diagnosis present

## 2023-06-30 NOTE — Patient Instructions (Signed)
 Uterine Bleeding After Menopause: What It Means Menopause is the end of a female's fertile years. After menopause, your ovaries can no longer release an egg. You'll no longer be able to get pregnant, and you'll no longer get a period. Any type of bleeding after menopause should be checked by a health care provider, as this is not normal. Treatment will depend on the cause of the bleeding. What can cause bleeding after menopause? Your provider may do tests to find out why you're bleeding. You may have bleeding if: You take hormones to treat the symptoms of menopause. The lining of your uterus thins as a result of low estrogen. The lining of your uterus thickens as a result of too much estrogen. You have cancer in the uterus. You have growths in the uterus, such as: Polyps. Fibroids. Follow these instructions at home: Pay attention to any changes in your symptoms. Let your provider know about them. If told by your provider: Avoid using tampons and douches. Get regular pelvic exams, including Pap tests. Take iron supplements. Change your pads regularly. Take your medicines only as told. Contact a health care provider if: You have pain in your belly. You have headaches. You have a fever or chills. You feel faint or dizzy. Get help right away if: You pass large blood clots. You have heavy bleeding that needs more than 1 pad an hour and you've never had this before. This information is not intended to replace advice given to you by your health care provider. Make sure you discuss any questions you have with your health care provider. Document Revised: 11/11/2022 Document Reviewed: 09/08/2022 Elsevier Patient Education  2025 [employer].

## 2023-07-02 ENCOUNTER — Ambulatory Visit: Payer: Self-pay | Admitting: Obstetrics and Gynecology

## 2023-07-02 DIAGNOSIS — R87612 Low grade squamous intraepithelial lesion on cytologic smear of cervix (LGSIL): Secondary | ICD-10-CM

## 2023-07-02 LAB — CYTOLOGY - PAP
Comment: NEGATIVE
High risk HPV: NEGATIVE

## 2023-07-03 ENCOUNTER — Encounter: Payer: Self-pay | Admitting: Internal Medicine

## 2023-07-16 ENCOUNTER — Other Ambulatory Visit: Payer: Self-pay | Admitting: Internal Medicine

## 2023-07-21 ENCOUNTER — Other Ambulatory Visit: Admitting: Obstetrics and Gynecology

## 2023-07-21 ENCOUNTER — Ambulatory Visit (INDEPENDENT_AMBULATORY_CARE_PROVIDER_SITE_OTHER)

## 2023-07-21 ENCOUNTER — Encounter: Payer: Self-pay | Admitting: Obstetrics and Gynecology

## 2023-07-21 ENCOUNTER — Ambulatory Visit (INDEPENDENT_AMBULATORY_CARE_PROVIDER_SITE_OTHER): Admitting: Obstetrics and Gynecology

## 2023-07-21 ENCOUNTER — Other Ambulatory Visit

## 2023-07-21 VITALS — BP 134/86 | HR 85

## 2023-07-21 DIAGNOSIS — L989 Disorder of the skin and subcutaneous tissue, unspecified: Secondary | ICD-10-CM | POA: Diagnosis not present

## 2023-07-21 DIAGNOSIS — N950 Postmenopausal bleeding: Secondary | ICD-10-CM

## 2023-07-21 NOTE — Progress Notes (Unsigned)
 GYNECOLOGY  VISIT   HPI: 53 y.o.   Divorced  Caucasian female   G2P0 with No LMP recorded. Patient has had an ablation.   here for: u/s consult     Pap 03/17/2022 showed epithelial atypia, neg HR HPV.    Follow up pap 06/30/22 showed LGSIL, neg HR HPV.      Hx endometrial ablation.  Noticed some vaginal bright bleeding about 4 months ago.  It occurred once with wiping.  No recurrence of bleeding since then.   FSH 51.4 and estradiol  9.6 on 09/24/22.   She is unable to take black cohosh due to her Effexor  use.  Her pharmacist gave this advisory.  Has hot flashes.    She has rash on her legs, arms and face.  It is painful.  Does not have a dermatologist.   FH of uterine, cervical and breast cancer.  She will have genetics counseling and potential testing.   GYNECOLOGIC HISTORY: No LMP recorded. Patient has had an ablation. Contraception:  Ablation  Menopausal hormone therapy:  n/a Last 2 paps:  06/30/23 LSIL, HR HPV neg, 03/17/22 Atrophic pattern, HR HPV Neg  History of abnormal Pap or positive HPV:  yes Mammogram:  04/29/22 Breast Density Cat A, BIRADS Cat 1 neg         OB History     Gravida  2   Para      Term      Preterm      AB      Living  2      SAB      IAB      Ectopic      Multiple      Live Births  2              Patient Active Problem List   Diagnosis Date Noted   Postoperative breast asymmetry 01/31/2020   Hypertension 09/02/2019   OSA (obstructive sleep apnea) 06/17/2019   Back pain 04/29/2018   Neck pain 04/29/2018   Symptomatic mammary hypertrophy 04/29/2018   GAD (generalized anxiety disorder) 11/03/2013   Major psychotic depression, recurrent (HCC) 11/03/2013   Seasonal and perennial allergic rhinitis 02/29/2012   Food allergy  02/29/2012   Medication intolerance 02/29/2012   Asthma, moderate persistent 12/23/2011   Depression 12/09/2011   Anxiety 12/09/2011    Past Medical History:  Diagnosis Date   Anxiety    Asthma     Chicken pox    Complication of anesthesia    unable to tolerate NSAIDS   Depression    Environmental allergies    food/medication   Family history of adverse reaction to anesthesia    mother skin glue allergy  post surgery    GERD (gastroesophageal reflux disease)    Headache(784.0)    Hypertension    Hypokalemia    PID (pelvic inflammatory disease)    2007 - prior to ablation   Pneumomediastinum (HCC)    Seasonal allergies    Sleep apnea    Suicidal ideations    UTI (urinary tract infection)     Past Surgical History:  Procedure Laterality Date   ABLATION     endometrial ablation in 2007 or 2008   BREAST BIOPSY  04/14/2011   BREAST REDUCTION SURGERY Bilateral 09/14/2019   Procedure: BILATERAL BREAST REDUCTION WITH LIPOSUCTION;  Surgeon: Lowery Estefana RAMAN, DO;  Location: [facility];  Service: Plastics;  Laterality: Bilateral;  3 hours   REDUCTION MAMMAPLASTY     TUBAL LIGATION  Current Outpatient Medications  Medication Sig Dispense Refill   ADVAIR  DISKUS 250-50 MCG/ACT AEPB INHALE 1 DOSE BY MOUTH TWICE DAILY, THEN RINSE MOUTH 60 each 11   budesonide  (PULMICORT ) 0.5 MG/2ML nebulizer solution Take 2 mLs (0.5 mg total) by nebulization daily. (Patient taking differently: Take 0.5 mg by nebulization daily as needed (shortness of breath (winter months)).) 60 mL 5   cetirizine (ZYRTEC) 10 MG chewable tablet Chew 10 mg by mouth daily.     diltiazem  (CARDIZEM  CD) 120 MG 24 hr capsule Take 1 capsule (120 mg total) by mouth daily. 30 capsule 5   EPINEPHRINE  0.3 mg/0.3 mL IJ SOAJ injection INJECT 0.3MG  INTO THE MUSCLE AS NEEDED FOR ANAPHYLAXIS 2 each 0   hydrOXYzine  (ATARAX ) 25 MG tablet Take 1 tablet (25 mg total) by mouth daily as needed for anxiety. 60 tablet 0   ipratropium (ATROVENT ) 0.03 % nasal spray Place 2 sprays into the nose 2 (two) times daily. 2 sprays each nostril twice daily 30 mL 5   ipratropium-albuterol  (DUONEB) 0.5-2.5 (3) MG/3ML SOLN Take 3  mLs by nebulization every 6 (six) hours as needed (shortness of breath/wheezing.). 360 mL 5   irbesartan  (AVAPRO ) 300 MG tablet Take 1 tablet (300 mg total) by mouth daily. 30 tablet 5   omeprazole (PRILOSEC) 20 MG capsule Take 20 mg by mouth daily.     venlafaxine  XR (EFFEXOR -XR) 75 MG 24 hr capsule Take 3 capsules (225 mg total) by mouth daily with breakfast. 270 capsule 0   VENTOLIN  HFA 108 (90 Base) MCG/ACT inhaler INHALE 2 PUFFS BY MOUTH EVERY 4 HOURS AS NEEDED FOR WHEEZING AND FOR SHORTNESS OF BREATH 18 g 11   No current facility-administered medications for this visit.     ALLERGIES: Nsaids, Shellfish allergy , Abilify [aripiprazole], Amlodipine, Amoxicillin, Fish allergy , Latuda [lurasidone hcl], Levaquin [levofloxacin in d5w], Other, Penicillins, Prednisone, Seroquel [quetiapine fumarate], Ativan  [lorazepam ], Sulfa antibiotics, and Xanax [alprazolam]  Family History  Problem Relation Age of Onset   Heart attack Mother 42       Bypass x5   Allergies Mother    CAD Mother    High blood pressure Mother    High Cholesterol Mother    Heart disease Mother    Arthritis Mother    Heart failure Mother    Diabetes Mellitus II Father    Hypertension Father    Heart disease Father    CAD Father    Other Father        Agent orange exposure   Arthritis Father    Depression Father    Atrial fibrillation Father    Cancer Sister        Uterine and cervical cancer   Breast cancer Maternal Aunt    Allergies Maternal Grandmother    Atrial fibrillation Maternal Grandmother    Stroke Maternal Grandmother    Asthma Maternal Grandfather    Asthma Paternal Grandmother    Asthma Paternal Grandfather    Allergies Daughter    Psoriasis Daughter    Arthritis Daughter        psoriatic   Hypothyroidism Daughter    Breast cancer Other    Suicidality Neg Hx    Anxiety disorder Neg Hx     Social History   Socioeconomic History   Marital status: Divorced    Spouse name: Not on file    Number of children: Not on file   Years of education: Not on file   Highest education level: Associate degree: occupational, Scientist, product/process development, or vocational  program  Occupational History   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 Lives at home with 2 teenage children. Never smoker.   Social Drivers of Health   Financial Resource Strain: High Risk (03/16/2023)   Overall Financial Resource Strain (CARDIA)    Difficulty of Paying Living Expenses: Hard  Food Insecurity: Food Insecurity Present (03/16/2023)   Hunger Vital Sign    Worried About Running Out of Food in the Last Year: Sometimes true    Ran Out of Food in the Last Year: Sometimes true  Transportation Needs: No Transportation Needs (03/16/2023)   PRAPARE - Administrator, Civil Service (Medical): No    Lack of Transportation (Non-Medical): No  Physical Activity: Sufficiently Active (03/16/2023)   Exercise Vital Sign    Days of Exercise per Week: 3 days    Minutes of Exercise per Session: 70 min  Stress: No Stress Concern Present (03/16/2023)   [employer] of Occupational Health - Occupational Stress Questionnaire    Feeling of Stress : Not at all  Social Connections: Moderately Integrated (03/16/2023)   Social Connection and Isolation Panel    Frequency of Communication with Friends and Family: Three times a week    Frequency of Social Gatherings with Friends and Family: More than  three times a week    Attends Religious Services: More than 4 times per year    Active Member of [employer] or Organizations: Yes    Attends Banker Meetings: More than 4 times per year    Marital Status: Divorced  Intimate Partner Violence: Unknown (04/17/2021)   Received from [facility]   HITS    Physically Hurt: Not on file    Insult or Talk Down To: Not on file    Threaten Physical Harm: Not on file    Scream or Curse: Not on file    Review of Systems  All other systems reviewed and are negative.   PHYSICAL EXAMINATION:   BP 134/86 (BP Location: Left Arm, Patient Position: Sitting)   Pulse 85   SpO2 97%     General appearance: alert, cooperative and appears stated age   Pelvic US  Uterus 5.31 x 3.51 x 3.06 cm.  EMS 3.18 mm.  No masses or thickening.  Fluid in the cervical canal.  Left ovary 2.12 x 1.87 x 1.45 cm.   Atrophic.  Right ovary 2.08 x 1.22 x 1.33 cm.   Atrophic.  No adnexal masses.  No free fluid.  ASSESSMENT:  Postmenopausal bleeding.  Status post endometrial ablation.  Paps showing epithelial atypia and then LGSIL.  HR HPV negative.  FH uterine and cervical cancer.  Painful skin lesions.   PLAN:  US  images and report reviewed.  Reassurance given regarding US  findings.  Return for colposcopy.  Procedure reviewed.  Referral to dermatology.  30 min  total time was spent for this patient encounter,  including preparation, face-to-face counseling with the patient, coordination of care, and documentation of the encounter.

## 2023-07-22 LAB — ESTRADIOL: Estradiol: <15 pg/mL

## 2023-07-22 LAB — FOLLICLE STIMULATING HORMONE: FSH: 61.9 m[IU]/mL

## 2023-07-22 NOTE — Patient Instructions (Signed)
 Colposcopy  Colposcopy is a procedure to examine the lowest part of the uterus (cervix) for abnormalities or signs of disease. This procedure is done using an instrument that makes objects appear larger and provides light (colposcope). During the procedure, your health care provider may remove a tissue sample to look at under a microscope (biopsy). A biopsy may be done if any unusual cells are seen during the colposcopy. You may have a colposcopy if you have: An abnormal Pap smear, also called a Pap test. This screening test is used to check for signs of cancer or infection of the vagina, cervix, and uterus. An HPV (human papillomavirus) test and get a positive result for a type of HPV that puts you at high risk of cancer. Certain conditions or symptoms, such as: A sore, or lesion, on your cervix. Genital warts on your vulva, vagina, or cervix. Pain during sex. Vaginal bleeding, especially after sex. A growth on your cervix (cervical polyp) that needs to be removed. Let your health care provider know about: Any allergies you have, including allergies to medicines, latex, or iodine. All medicines you are taking, including vitamins, herbs, eye drops, creams, and over-the-counter medicines. Any bleeding problems you have. Any surgeries you have had. Any medical conditions you have, such as pelvic inflammatory disease (PID) or an endometrial disorder. The pattern of your menstrual cycles and the form of birth control (contraception) you use, if any. Your medical history, including any cervical treatments and how well you tolerated the procedure (if you have ever fainted). Whether you are pregnant or may be pregnant. What are the risks? Generally, this is a safe procedure. However, problems may occur, including: Infection. Symptoms of infection may include fever, bad-smelling vaginal discharge, or pelvic pain. Vaginal bleeding. Allergic reactions to medicines. Damage to nearby structures or  organs. What happens before the procedure? Medicines Ask your health care provider about: Changing or stopping your regular medicines. This is especially important if you are taking diabetes medicines or blood thinners. Taking medicines such as aspirin and ibuprofen. These medicines can thin your blood. Do not take these medicines unless your health care provider tells you to take them. Your health care provider will likely tell you to avoid taking aspirin, or medicine that contains aspirin, for 7 days before the procedure. Taking over-the-counter medicines, vitamins, herbs, and supplements. General instructions Tell your health care provider if you have your menstrual period now or will have it at the time of your procedure. A colposcopy is not normally done during your menstrual period. If you use contraception, continue to use it before your procedure. For 24 hours before the procedure: Do not use douche products or tampons. Do not use medicines, creams, or suppositories in the vagina. Do not have sex or insert anything into your vagina. Ask your health care provider what steps will be taken to prevent infection. What happens during the procedure? You will lie down on your back, with your feet in foot rests (stirrups). An instrument called a speculum will be inserted into your vagina. This will be used so your health care provider can see your cervix and the inside of your vagina. A cotton swab will be used to place a small amount of a liquid (solution) on the areas to be examined. This solution makes it easier to see abnormal cells. You may feel a slight burning during this part. The colposcope will be used to scan the cervix with a bright white light. The colposcope will be held near  your vulva and will make your vulva, vagina, and cervix look bigger so they can be seen better. If a biopsy is needed: You may be given a medicine to numb the area (local anesthetic). Surgical tools will be  used to remove mucus and cells through your vagina. You may feel mild pain while the tissue sample is removed. Bleeding may occur. A solution may be used to stop the bleeding. The tissue removed will be sent to a lab to be looked at under a microscope. The procedure may vary among health care providers and hospitals. What happens after the procedure? You may have some cramping in your abdomen. This should go away after a few minutes. It is up to you to get the results of your procedure. Ask your health care provider, or the department that is doing the procedure, when your results will be ready. Summary Colposcopy is a procedure to examine the lowest part of the uterus (cervix), for signs of disease. A biopsy may be done as part of the procedure. You may have some cramping in your abdomen. This should go away after a few minutes. It is up to you to get the results of your procedure. Ask your health care provider, or the department that is doing the procedure, when your results will be ready. This information is not intended to replace advice given to you by your health care provider. Make sure you discuss any questions you have with your health care provider. Document Revised: 05/27/2020 Document Reviewed: 05/27/2020 Elsevier Patient Education  2024 [employer].

## 2023-07-24 DIAGNOSIS — G4733 Obstructive sleep apnea (adult) (pediatric): Secondary | ICD-10-CM | POA: Diagnosis not present

## 2023-07-28 ENCOUNTER — Encounter: Payer: Self-pay | Admitting: Genetic Counselor

## 2023-07-28 ENCOUNTER — Inpatient Hospital Stay: Attending: Genetic Counselor | Admitting: Genetic Counselor

## 2023-07-28 ENCOUNTER — Other Ambulatory Visit: Payer: Self-pay | Admitting: Genetic Counselor

## 2023-07-28 ENCOUNTER — Inpatient Hospital Stay

## 2023-07-28 DIAGNOSIS — Z803 Family history of malignant neoplasm of breast: Secondary | ICD-10-CM | POA: Diagnosis present

## 2023-07-28 DIAGNOSIS — Z8049 Family history of malignant neoplasm of other genital organs: Secondary | ICD-10-CM | POA: Insufficient documentation

## 2023-07-28 DIAGNOSIS — Z1379 Encounter for other screening for genetic and chromosomal anomalies: Secondary | ICD-10-CM | POA: Insufficient documentation

## 2023-07-28 LAB — GENETIC SCREENING ORDER

## 2023-07-28 NOTE — Progress Notes (Signed)
 REFERRING PROVIDER: Cathlyn JAYSON Nikki Bobie FORBES, MD [address] [place],  [place] [address]  PRIMARY PROVIDER:  Ozell Heron HERO, MD  PRIMARY REASON FOR VISIT:  1. Family history of breast cancer   2. Family history of cervical cancer   3. Family history of uterine cancer    HISTORY OF PRESENT ILLNESS:   [name], a 53 y.o. female, was seen for a Stella cancer genetics consultation at the request of Dr. Cathlyn JAYSON Nikki due to a family history of cancer.  [name] presents to clinic today to discuss the possibility of a hereditary predisposition to cancer, to discuss genetic testing, and to further clarify her future cancer risks, as well as potential cancer risks for family members.   [name] is a 53 y.o. female with no personal history of cancer.    RISK FACTORS:  Menarche was at age 34.  First live birth at age 63.  OCP use for approximately 1.5 years.  Ovaries intact: yes.  Uterus intact: yes.  Menopausal status: postmenopausal, age is unknown due to uterine ablation HRT use: 0 years. Colonoscopy: no; normal cologuard. Mammogram within the last year: yes. Number of breast biopsies: reports one benign biopsy in 2012. Up to date with pelvic exams: yes. Any excessive radiation exposure in the past: no  Past Medical History:  Diagnosis Date   Anxiety    Asthma    Chicken pox    Complication of anesthesia    unable to tolerate NSAIDS   Depression    Environmental allergies    food/medication   Family history of adverse reaction to anesthesia    mother skin glue allergy  post surgery    GERD (gastroesophageal reflux disease)    Headache(784.0)    Hypertension    Hypokalemia    PID (pelvic inflammatory disease)    2007 - prior to ablation   Pneumomediastinum (HCC)    Seasonal allergies    Sleep apnea    Suicidal ideations    UTI (urinary tract infection)     Past Surgical History:  Procedure Laterality Date   ABLATION     endometrial  ablation in 2007 or 2008   BREAST BIOPSY  04/14/2011   BREAST REDUCTION SURGERY Bilateral 09/14/2019   Procedure: BILATERAL BREAST REDUCTION WITH LIPOSUCTION;  Surgeon: Lowery Estefana RAMAN, DO;  Location: [facility];  Service: Plastics;  Laterality: Bilateral;  3 hours   REDUCTION MAMMAPLASTY     TUBAL LIGATION      Social History   Socioeconomic History   Marital status: Divorced    Spouse name: Not on file   Number of children: Not on file   Years of education: Not on file   Highest education level: Associate degree: occupational, Scientist, product/process development, or vocational program  Occupational History   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 Lives at home  with 2 teenage children. Never smoker.   Social Drivers of Health   Financial Resource Strain: High Risk (03/16/2023)   Overall Financial Resource Strain (CARDIA)    Difficulty of Paying Living Expenses: Hard  Food Insecurity: Food Insecurity Present (03/16/2023)   Hunger Vital Sign    Worried About Running Out of Food in the Last Year: Sometimes true    Ran Out of Food in the Last Year: Sometimes true  Transportation Needs: No Transportation Needs (03/16/2023)   PRAPARE - Administrator, Civil Service (Medical): No    Lack of Transportation (Non-Medical): No  Physical Activity: Sufficiently Active (03/16/2023)   Exercise Vital Sign    Days of Exercise per Week: 3 days     Minutes of Exercise per Session: 70 min  Stress: No Stress Concern Present (03/16/2023)   [employer] of Occupational Health - Occupational Stress Questionnaire    Feeling of Stress : Not at all  Social Connections: Moderately Integrated (03/16/2023)   Social Connection and Isolation Panel    Frequency of Communication with Friends and Family: Three times a week    Frequency of Social Gatherings with Friends and Family: More than three times a week    Attends Religious Services: More than 4 times per year    Active Member of [employer] or Organizations: Yes    Attends Engineer, structural: More than 4 times per year    Marital Status: Divorced     FAMILY HISTORY:  We obtained a detailed, 4-generation family history.  Significant diagnoses are listed below:  Family History  Problem Relation Age of Onset   Cervical cancer Sister 48 - 84       met to uterus   Breast cancer Maternal Aunt        dx. <50   Cancer Maternal Aunt        uterine or cervical   Breast cancer Other        Ms. Brisbin is unaware of previous family history of genetic testing for hereditary cancer risks. There is no reported Ashkenazi Jewish ancestry.   GENETIC COUNSELING ASSESSMENT: Ms. Anastos is a 53 y.o. female with a family history of cancer which is somewhat suggestive of a hereditary predisposition to cancer. We, therefore, discussed and recommended the following at today's visit.   DISCUSSION: We discussed that 5 - 10% of cancer is hereditary, with most cases of hereditary breast cancer associated with BRCA1/2.  There are other genes that can be associated with hereditary breast and uterine cancer syndromes.  We discussed that testing is beneficial for several reasons, including knowing about other cancer risks, identifying potential screening and risk-reduction options that may be appropriate, and to understanding if other family members could be at risk for cancer and allowing them to undergo genetic  testing.  We reviewed the characteristics, features and inheritance patterns of hereditary cancer syndromes. We also discussed genetic testing, including the appropriate family members to test, the process of testing, insurance coverage and turn-around-time for results. We discussed the implications of a negative, positive, carrier and/or variant of uncertain significant result. We discussed that negative results would be uninformative given that Ms. Frater does not have a personal history of cancer. We recommended Ms. Kaminsky pursue genetic testing for a panel that contains genes associated with breast and uterine cancer.  Ms. Coccia was offered a common hereditary cancer panel (40 genes) and an expanded pan-cancer panel (77 genes). Ms. Cutler was informed of the benefits and  limitations of each panel, including that expanded pan-cancer panels contain several genes that do not have clear management guidelines at this point in time.  We also discussed that as the number of genes included on a panel increases, the chances of variants of uncertain significance increases.  After considering the benefits and limitations of each gene panel, Ms. Geeslin elected to have [facility] CancerNext-Expanded Panel.  The CancerNext-Expanded gene panel offered by [facility] and includes sequencing, rearrangement, and RNA analysis for the following 77 genes: AIP, ALK, APC, ATM, AXIN2, BAP1, BARD1, BMPR1A, BRCA1, BRCA2, BRIP1, CDC73, CDH1, CDK4, CDKN1B, CDKN2A, CEBPA, CHEK2, CTNNA1, DDX41, DICER1, ETV6, FH, FLCN, GATA2, LZTR1, MAX, MBD4, MEN1, MET, MLH1, MSH2, MSH3, MSH6, MUTYH, NF1, NF2, NTHL1, PALB2, PHOX2B, PMS2, POT1, PRKAR1A, PTCH1, PTEN, RAD51C, RAD51D, RB1, RET, RPS20, RUNX1, SDHA, SDHAF2, SDHB, SDHC, SDHD, SMAD4, SMARCA4, SMARCB1, SMARCE1, STK11, SUFU, TMEM127, TP53, TSC1, TSC2, VHL, and WT1 (sequencing and deletion/duplication); EGFR, HOXB13, KIT, MITF, PDGFRA, POLD1, and POLE (sequencing only); EPCAM and GREM1  (deletion/duplication only).    Based on Ms. Ricciardelli's family history of cancer, she meets medical criteria for genetic testing. Though Ms. Arango is not personally affected, there are no affected family members that are willing/able to undergo hereditary cancer testing. Despite that she meets criteria, she may still have an out of pocket cost. We discussed that if her out of pocket cost for testing is over $100, the laboratory should contact them to discuss self-pay prices, patient pay assistance programs, if applicable, and other billing options.  We discussed that some people do not want to undergo genetic testing due to fear of genetic discrimination.  A federal law called the Genetic Information Non-Discrimination Act (GINA) of 2008 helps protect individuals against genetic discrimination based on their genetic test results.  It impacts both health insurance and employment.  With health insurance, it protects against increased premiums, being kicked off insurance or being forced to take a test in order to be insured.  For employment it protects against hiring, firing and promoting decisions based on genetic test results.  GINA does not apply to those in the [employer], those who work for companies with less than 15 employees, and new life insurance or long-term disability insurance policies.  Health status due to a cancer diagnosis is not protected under GINA.  PLAN: After considering the risks, benefits, and limitations, Ms. Poirier provided informed consent to pursue genetic testing and the blood sample was sent to [facility] for analysis of the CancerNext-Expanded Panel. Results should be available within approximately 2-3 weeks' time, at which point they will be disclosed by telephone to Ms. Janvier, as will any additional recommendations warranted by these results. Ms. Mackintosh will receive a summary of her genetic counseling visit and a copy of her results once available. This information will also  be available in Epic.   Ms. Lisenby questions were answered to her satisfaction today. Our contact information was provided should additional questions or concerns arise. Thank you for the referral and allowing us  to share in the care of your patient.   Amaal Dimartino, MS, [facility] Genetic Counselor [email or web address] (P) [phone]  50 minutes were spent on the date of the encounter in service to the patient including preparation, face-to-face consultation, documentation and care coordination. The patient was seen alone.  Drs. Gudena and/or Lanny were available to discuss this case as needed.  _______________________________________________________________________ For Office Staff:  Number of people involved in session: 1 Was an Intern/ student involved with case: no

## 2023-08-09 ENCOUNTER — Ambulatory Visit
Admission: EM | Admit: 2023-08-09 | Discharge: 2023-08-09 | Disposition: A | Discharge status: Home / Self Care | Attending: Family Medicine | Admitting: Family Medicine

## 2023-08-09 ENCOUNTER — Other Ambulatory Visit: Payer: Self-pay

## 2023-08-09 ENCOUNTER — Encounter: Payer: Self-pay | Admitting: Emergency Medicine

## 2023-08-09 DIAGNOSIS — B029 Zoster without complications: Secondary | ICD-10-CM | POA: Diagnosis not present

## 2023-08-09 HISTORY — DX: Dysplasia of cervix uteri, unspecified: N87.9

## 2023-08-09 MED ORDER — VALACYCLOVIR HCL 1 G PO TABS: 1000.0000 mg | ORAL_TABLET | Freq: Three times a day (TID) | ORAL | 0 refills | Status: DC

## 2023-08-09 MED ORDER — METHYLPREDNISOLONE ACETATE 40 MG/ML IJ SUSP
40.0000 mg | Freq: Once | INTRAMUSCULAR | Status: AC
  Administered 2023-08-09: 40 mg via INTRAMUSCULAR

## 2023-08-09 NOTE — ED Provider Notes (Signed)
 [facility]    CSN: [id number] Arrival date & time: 08/09/23  9060      History   Chief Complaint Chief Complaint  Patient presents with   Back Pain    HPI Nancy Barnes is a 53 y.o. female.   Patient presenting today with about a week of right sided burning and stinging sensation from right mid back extending around right rib cage region and now red patchy rash in the area.  Denies new soaps or products, injury to the area, throat itching or swelling, chest tightness, shortness of breath, wheezing.  History of shingles that felt similar.  Trying ice, heat, stretches thinking it was initially a muscular strain with no relief.    Past Medical History:  Diagnosis Date   Anxiety    Asthma    Chicken pox    Complication of anesthesia    unable to tolerate NSAIDS   Depression    Dysplasia of cervix    Environmental allergies    food/medication   Family history of adverse reaction to anesthesia    mother skin glue allergy  post surgery    GERD (gastroesophageal reflux disease)    Headache(784.0)    Hypertension    Hypokalemia    PID (pelvic inflammatory disease)    2007 - prior to ablation   Pneumomediastinum (HCC)    Seasonal allergies    Sleep apnea    Suicidal ideations    UTI (urinary tract infection)     Patient Active Problem List   Diagnosis Date Noted   Postoperative breast asymmetry 01/31/2020   Hypertension 09/02/2019   OSA (obstructive sleep apnea) 06/17/2019   Back pain 04/29/2018   Neck pain 04/29/2018   Symptomatic mammary hypertrophy 04/29/2018   GAD (generalized anxiety disorder) 11/03/2013   Major psychotic depression, recurrent (HCC) 11/03/2013   Seasonal and perennial allergic rhinitis 02/29/2012   Food allergy  02/29/2012   Medication intolerance 02/29/2012   Asthma, moderate persistent 12/23/2011   Depression 12/09/2011   Anxiety 12/09/2011    Past Surgical History:  Procedure Laterality Date   ABLATION      endometrial ablation in 2007 or 2008   BREAST BIOPSY  04/14/2011   BREAST REDUCTION SURGERY Bilateral 09/14/2019   Procedure: BILATERAL BREAST REDUCTION WITH LIPOSUCTION;  Surgeon: Lowery Estefana RAMAN, DO;  Location: [facility];  Service: Plastics;  Laterality: Bilateral;  3 hours   REDUCTION MAMMAPLASTY     TUBAL LIGATION      OB History     Gravida  2   Para      Term      Preterm      AB      Living  2      SAB      IAB      Ectopic      Multiple      Live Births  2            Home Medications    Prior to Admission medications   Medication Sig Start Date End Date Taking? Authorizing Provider  valACYclovir  (VALTREX ) 1000 MG tablet Take 1 tablet (1,000 mg total) by mouth 3 (three) times daily. 08/09/23  Yes Stuart Vernell Norris, PA-C  ADVAIR  DISKUS 250-50 MCG/ACT AEPB INHALE 1 DOSE BY MOUTH TWICE DAILY, THEN RINSE MOUTH 06/08/23   Young, Reggy D, MD  budesonide  (PULMICORT ) 0.5 MG/2ML nebulizer solution Take 2 mLs (0.5 mg total) by nebulization daily. Patient taking differently: Take 0.5 mg by  nebulization daily as needed (shortness of breath (winter months)). 12/12/19   Koberlein, Junell C, MD  cetirizine (ZYRTEC) 10 MG chewable tablet Chew 10 mg by mouth daily.    [provider]  diltiazem  (CARDIZEM  CD) 120 MG 24 hr capsule Take 1 capsule (120 mg total) by mouth daily. 03/17/23   Ozell Heron HERO, MD  EPINEPHRINE  0.3 mg/0.3 mL IJ SOAJ injection INJECT 0.3MG  INTO THE MUSCLE AS NEEDED FOR ANAPHYLAXIS 07/16/23   Neysa Rama D, MD  hydrOXYzine  (ATARAX ) 25 MG tablet Take 1 tablet (25 mg total) by mouth daily as needed for anxiety. 06/26/23   Arfeen, Syed T, MD  ipratropium (ATROVENT ) 0.03 % nasal spray Place 2 sprays into the nose 2 (two) times daily. 2 sprays each nostril twice daily 12/12/19   Koberlein, Junell C, MD  ipratropium-albuterol  (DUONEB) 0.5-2.5 (3) MG/3ML SOLN Take 3 mLs by nebulization every 6 (six) hours as needed  (shortness of breath/wheezing.). 12/12/19   Koberlein, Junell C, MD  irbesartan  (AVAPRO ) 300 MG tablet Take 1 tablet (300 mg total) by mouth daily. 03/17/23   Ozell Heron HERO, MD  omeprazole (PRILOSEC) 20 MG capsule Take 20 mg by mouth daily.    [provider]  venlafaxine  XR (EFFEXOR -XR) 75 MG 24 hr capsule Take 3 capsules (225 mg total) by mouth daily with breakfast. 06/26/23   Arfeen, Leni DASEN, MD  VENTOLIN  HFA 108 (90 Base) MCG/ACT inhaler INHALE 2 PUFFS BY MOUTH EVERY 4 HOURS AS NEEDED FOR WHEEZING AND FOR SHORTNESS OF BREATH 06/08/23   Neysa Rama BIRCH, MD    Family History Family History  Problem Relation Age of Onset   Heart attack Mother 23       Bypass x5   Allergies Mother    CAD Mother    High blood pressure Mother    High Cholesterol Mother    Heart disease Mother    Arthritis Mother    Heart failure Mother    Diabetes Mellitus II Father    Hypertension Father    Heart disease Father    CAD Father    Other Father        Agent orange exposure   Arthritis Father    Depression Father    Atrial fibrillation Father    Cervical cancer Sister 57 - 21       met to uterus   Allergies Maternal Grandmother    Atrial fibrillation Maternal Grandmother    Stroke Maternal Grandmother    Asthma Maternal Grandfather    Asthma Paternal Grandmother    Asthma Paternal Grandfather    Allergies Daughter    Psoriasis Daughter    Arthritis Daughter        psoriatic   Hypothyroidism Daughter    Breast cancer Maternal Aunt        dx. <50   Cancer Maternal Aunt        uterine or cervical   Breast cancer Other    Suicidality Neg Hx    Anxiety disorder Neg Hx     Social History Social History   Tobacco Use   Smoking status: Never   Smokeless tobacco: Never  Vaping Use   Vaping status: Never Used  Substance Use Topics   Alcohol use: Not Currently    Comment: socially   Drug use: No     Allergies   Nsaids, Shellfish allergy , Abilify [aripiprazole], Amlodipine,  Amoxicillin, Fish allergy , Latuda [lurasidone hcl], Levaquin [levofloxacin in d5w], Other, Penicillins, Prednisone, Seroquel [quetiapine fumarate], Ativan  [lorazepam ],  Sulfa antibiotics, and Xanax [alprazolam]   Review of Systems Review of Systems Per HPI  Physical Exam Triage Vital Signs ED Triage Vitals  Encounter Vitals Group     BP 08/09/23 1003 (!) 171/101     Girls Systolic BP Percentile --      Girls Diastolic BP Percentile --      Boys Systolic BP Percentile --      Boys Diastolic BP Percentile --      Pulse Rate 08/09/23 1003 92     Resp 08/09/23 1003 20     Temp 08/09/23 1003 98.4 F (36.9 C)     Temp Source 08/09/23 1003 Oral     SpO2 08/09/23 1003 95 %     Weight --      Height --      Head Circumference --      Peak Flow --      Pain Score 08/09/23 1006 7     Pain Loc --      Pain Education --      Exclude from Growth Chart --    No data found.  Updated Vital Signs BP (!) 171/101 (BP Location: Right Arm) Comment: reports is on bp med, but has not taken todays dose.  Pulse 92   Temp 98.4 F (36.9 C) (Oral)   Resp 20   SpO2 95%   Visual Acuity Right Eye Distance:   Left Eye Distance:   Bilateral Distance:    Right Eye Near:   Left Eye Near:    Bilateral Near:     Physical Exam Vitals and nursing note reviewed.  Constitutional:      Appearance: Normal appearance. She is not ill-appearing.  HENT:     Head: Atraumatic.  Eyes:     Extraocular Movements: Extraocular movements intact.     Conjunctiva/sclera: Conjunctivae normal.  Cardiovascular:     Rate and Rhythm: Normal rate.  Pulmonary:     Effort: Pulmonary effort is normal.  Musculoskeletal:        General: Normal range of motion.     Cervical back: Normal range of motion and neck supple.  Skin:    General: Skin is warm.     Findings: Rash present.     Comments: Erythematous maculopapular rash in the linear pattern of her pain from the right mid back extending on the right flank   Neurological:     Mental Status: She is alert and oriented to person, place, and time.  Psychiatric:        Mood and Affect: Mood normal.        Thought Content: Thought content normal.        Judgment: Judgment normal.      UC Treatments / Results  Labs (all labs ordered are listed, but only abnormal results are displayed) Labs Reviewed - No data to display  EKG   Radiology No results found.  Procedures Procedures (including critical care time)  Medications Ordered in UC Medications  methylPREDNISolone  acetate (DEPO-MEDROL ) injection 40 mg (has no administration in time range)    Initial Impression / Assessment and Plan / UC Course  I have reviewed the triage vital signs and the nursing notes.  Pertinent labs & imaging results that were available during my care of the patient were reviewed by me and considered in my medical decision making (see chart for details).     Suspicious for shingles.  She states she cannot tolerate prednisone but tolerated Solu-Medrol  and Depo-Medrol  IM  well so we will give IM Depo-Medrol  in addition to starting Valtrex .  Discussed supportive over-the-counter medications, home care, risk for contagiousness, etc.  Return for worsening symptoms.  Final Clinical Impressions(s) / UC Diagnoses   Final diagnoses:  Herpes zoster without complication   Discharge Instructions   None    ED Prescriptions     Medication Sig Dispense Auth. Provider   valACYclovir  (VALTREX ) 1000 MG tablet Take 1 tablet (1,000 mg total) by mouth 3 (three) times daily. 21 tablet Stuart Vernell Norris, [place]      PDMP not reviewed this encounter.   Stuart Vernell Norris, [place] 08/09/23 1050

## 2023-08-09 NOTE — ED Triage Notes (Signed)
 Pt reports right sided burning/tingling x1 week under shoulder blade that radiates around rib cage. Denies injury. Denies urinary symptoms. Has tried ice/heat/stretches. Reports hx of shingles

## 2023-08-10 ENCOUNTER — Encounter: Admitting: Obstetrics and Gynecology

## 2023-08-14 ENCOUNTER — Encounter: Admitting: Obstetrics and Gynecology

## 2023-08-20 ENCOUNTER — Telehealth: Payer: Self-pay | Admitting: Genetic Counselor

## 2023-08-20 NOTE — Telephone Encounter (Signed)
 I contacted Ms. Carmical to discuss her genetic testing results. No pathogenic variants were identified in the 77 genes analyzed. Detailed clinic note to follow.  The test report has been scanned into EPIC and is located under the Molecular Pathology section of the Results Review tab.  A portion of the result report is included below for reference.   Arieona Swaggerty, MS, [facility] Genetic Counselor [email or web address] (P) [phone]

## 2023-08-21 ENCOUNTER — Encounter: Payer: Self-pay | Admitting: Genetic Counselor

## 2023-08-21 DIAGNOSIS — Z1379 Encounter for other screening for genetic and chromosomal anomalies: Secondary | ICD-10-CM | POA: Insufficient documentation

## 2023-08-25 ENCOUNTER — Ambulatory Visit: Payer: Self-pay | Admitting: Genetic Counselor

## 2023-08-25 DIAGNOSIS — Z1379 Encounter for other screening for genetic and chromosomal anomalies: Secondary | ICD-10-CM

## 2023-08-25 NOTE — Progress Notes (Signed)
 HPI:   Ms. Conkle was previously seen in the [facility] due to a family history of cancer and concerns regarding a hereditary predisposition to cancer. Please refer to our prior cancer genetics clinic note for more information regarding our discussion, assessment and recommendations, at the time. Ms. Eichhorn recent genetic test results were disclosed to her, as were recommendations warranted by these results. These results and recommendations are discussed in more detail below.  CANCER HISTORY:  Oncology History   No history exists.   FAMILY HISTORY:  We obtained a detailed, 4-generation family history.  Significant diagnoses are listed below:        Family History  Problem Relation Age of Onset   Cervical cancer Sister 77 - 87        met to uterus   Breast cancer Maternal Aunt          dx. <50   Cancer Maternal Aunt          uterine or cervical   Breast cancer Other             Ms. Bennion is unaware of previous family history of genetic testing for hereditary cancer risks. There is no reported Ashkenazi Jewish ancestry.   GENETIC TEST RESULTS:  The [facility] CancerNext-Expanded Panel found no pathogenic mutations.   The CancerNext-Expanded gene panel offered by [facility] and includes sequencing, rearrangement, and RNA analysis for the following 77 genes: AIP, ALK, APC, ATM, AXIN2, BAP1, BARD1, BMPR1A, BRCA1, BRCA2, BRIP1, CDC73, CDH1, CDK4, CDKN1B, CDKN2A, CEBPA, CHEK2, CTNNA1, DDX41, DICER1, ETV6, FH, FLCN, GATA2, LZTR1, MAX, MBD4, MEN1, MET, MLH1, MSH2, MSH3, MSH6, MUTYH, NF1, NF2, NTHL1, PALB2, PHOX2B, PMS2, POT1, PRKAR1A, PTCH1, PTEN, RAD51C, RAD51D, RB1, RET, RPS20, RUNX1, SDHA, SDHAF2, SDHB, SDHC, SDHD, SMAD4, SMARCA4, SMARCB1, SMARCE1, STK11, SUFU, TMEM127, TP53, TSC1, TSC2, VHL, and WT1 (sequencing and deletion/duplication); EGFR, HOXB13, KIT, MITF, PDGFRA, POLD1, and POLE (sequencing only); EPCAM and GREM1 (deletion/duplication only).    The test  report has been scanned into EPIC and is located under the Molecular Pathology section of the Results Review tab.  A portion of the result report is included below for reference. Genetic testing reported out on 08/03/2023.       Even though a pathogenic variant was not identified, possible explanations for the cancer in the family may include: There may be no hereditary risk for cancer in the family. The cancers in her family may be due to other genetic or environmental factors. There may be a gene mutation in one of these genes that current testing methods cannot detect, but that chance is small. There could be another gene that has not yet been discovered, or that we have not yet tested, that is responsible for the cancer diagnoses in the family.  It is also possible there is a hereditary cause for the cancer in the family that Ms. Dlouhy did not inherit.  Therefore, it is important to remain in touch with cancer genetics in the future so that we can continue to offer Ms. Dunigan the most up to date genetic testing.   ADDITIONAL GENETIC TESTING:  We discussed with Ms. Kulick that her genetic testing was fairly extensive.  If there are genes identified to increase cancer risk that can be analyzed in the future, we would be happy to discuss and coordinate this testing at that time.    CANCER SCREENING RECOMMENDATIONS:  Ms. Routson test result is considered negative (normal).  This means that we have  not identified a hereditary cause for her family history of cancer at this time.   An individual's cancer risk and medical management are not determined by genetic test results alone. Overall cancer risk assessment incorporates additional factors, including personal medical history, family history, and any available genetic information that may result in a personalized plan for cancer prevention and surveillance. Therefore, it is recommended she continue to follow the cancer management and screening  guidelines provided by her primary healthcare provider.  Based on the reported personal and family history, specific cancer screenings for Ms. Ivania Teagarden and her family include:  Breast Cancer Screening:  The Tyrer-Cuzick model is one of multiple prediction models developed to estimate an individual's lifetime risk of developing breast cancer. The Tyrer-Cuzick model is endorsed by the [employer] (NCCN). This model includes many risk factors such as family history, endogenous estrogen exposure, and benign breast disease. The calculation is highly-dependent on the accuracy of clinical data provided by the patient and can change over time. The Tyrer-Cuzick model may be repeated to reflect new information in her personal or family history in the future.   Ms. Yera Tyrer-Cuzick risk score is 5%. She is encouraged to continue to be mindful of her family history and be diligent with general population breast screening, including annual mammograms.  She is encouraged to contact us  regarding any changes to her personal or family history, as her recommendations for screening would be altered significantly if her lifetime risk is determined to be greater than 20% based on updated information.    RECOMMENDATIONS FOR FAMILY MEMBERS:   Since she did not inherit a mutation in a cancer predisposition gene included on this panel, her children could not have inherited a mutation from her in one of these genes. Other members of the family may still carry a pathogenic variant in one of these genes that Ms. Mennen did not inherit. Based on the family history, we recommend her mother have genetic counseling and testing.   FOLLOW-UP:  Cancer genetics is a rapidly advancing field and it is possible that new genetic tests will be appropriate for her and/or her family members in the future. We encouraged her to remain in contact with cancer genetics on an annual basis so we can update her  personal and family histories and let her know of advances in cancer genetics that may benefit this family.   Our contact number was provided. Ms. Scharnhorst questions were answered to her satisfaction, and she knows she is welcome to call us  at anytime with additional questions or concerns.   Eleonore Shippee, MS, [facility] Genetic Counselor [email or web address] (P) [phone]

## 2023-09-03 DIAGNOSIS — G4733 Obstructive sleep apnea (adult) (pediatric): Secondary | ICD-10-CM | POA: Diagnosis not present

## 2023-09-03 DIAGNOSIS — I10 Essential (primary) hypertension: Secondary | ICD-10-CM | POA: Diagnosis not present

## 2023-09-17 ENCOUNTER — Encounter: Payer: Self-pay | Admitting: Family Medicine

## 2023-09-17 ENCOUNTER — Ambulatory Visit: Admitting: Family Medicine

## 2023-09-17 VITALS — BP 120/82 | HR 75 | Temp 98.2°F | Ht 65.0 in | Wt 187.1 lb

## 2023-09-17 DIAGNOSIS — I10 Essential (primary) hypertension: Secondary | ICD-10-CM

## 2023-09-17 DIAGNOSIS — Z23 Encounter for immunization: Secondary | ICD-10-CM

## 2023-09-17 DIAGNOSIS — L989 Disorder of the skin and subcutaneous tissue, unspecified: Secondary | ICD-10-CM | POA: Diagnosis not present

## 2023-09-17 MED ORDER — TRIAMCINOLONE ACETONIDE 0.1 % EX CREA
1.0000 | TOPICAL_CREAM | Freq: Two times a day (BID) | CUTANEOUS | 2 refills | Status: AC
Start: 2023-09-17 — End: ?

## 2023-09-17 MED ORDER — DILTIAZEM HCL ER COATED BEADS 120 MG PO CP24
120.0000 mg | ORAL_CAPSULE | Freq: Every day | ORAL | 5 refills | Status: DC
Start: 2023-09-17 — End: 2023-11-17

## 2023-09-17 MED ORDER — IRBESARTAN 300 MG PO TABS
300.0000 mg | ORAL_TABLET | Freq: Every day | ORAL | 5 refills | Status: DC
Start: 2023-09-17 — End: 2023-11-17

## 2023-09-17 NOTE — Progress Notes (Unsigned)
 Established Patient Office Visit  Subjective   Patient ID: Nancy Barnes, female    DOB: January 31, 1970  Age: 53 y.o. MRN: [id number]  Chief Complaint  Patient presents with   Medical Management of Chronic Issues   Rash    Patient complains of painful bumps noted on the right upper arm, left forearm, posterior thigh and face x2 months, tried salicylic acid, and Neomycin, has upcoming appointment with dermatologist in January 2026    Pt reports she has had the rash for about 2 months, states that the rash on her legs has gotten worse, thought initially is was folliculitis, has tried different razors and it is still present, also on the backs of her arms and the face. States that she has been using the cerave with salycylic acid, has tried the pimple patches  and other retinol creams without any improvement. States that it will wax and wane sometimes. Pt states she has an appointment with dermatology in February 2026.   HTN -- BP in office performed and is well controlled. She  reports no side effects to the medications, no chest pain, SOB, dizziness or headaches. She has a BP cuff at home and is checking BP regularly, reports they are in the normal range.     Current Outpatient Medications  Medication Instructions   ADVAIR  DISKUS 250-50 MCG/ACT AEPB INHALE 1 DOSE BY MOUTH TWICE DAILY, THEN RINSE MOUTH   budesonide  (PULMICORT ) 0.5 mg, Nebulization, Daily   cetirizine (ZYRTEC) 10 mg, Daily   diltiazem  (CARDIZEM  CD) 120 mg, Oral, Daily   EPINEPHRINE  0.3 mg/0.3 mL IJ SOAJ injection INJECT 0.3MG  INTO THE MUSCLE AS NEEDED FOR ANAPHYLAXIS   hydrOXYzine  (ATARAX ) 25 mg, Oral, Daily PRN   ipratropium (ATROVENT ) 0.03 % nasal spray 2 sprays, Nasal, 2 times daily, 2 sprays each nostril twice daily   ipratropium-albuterol  (DUONEB) 0.5-2.5 (3) MG/3ML SOLN 3 mLs, Nebulization, Every 6 hours PRN   irbesartan  (AVAPRO ) 300 mg, Oral, Daily   omeprazole (PRILOSEC) 20 mg, Daily   triamcinolone  cream  (KENALOG ) 0.1 % 1 Application, Topical, 2 times daily   venlafaxine  XR (EFFEXOR -XR) 225 mg, Oral, Daily with breakfast   VENTOLIN  HFA 108 (90 Base) MCG/ACT inhaler INHALE 2 PUFFS BY MOUTH EVERY 4 HOURS AS NEEDED FOR WHEEZING AND FOR SHORTNESS OF BREATH    Patient Active Problem List   Diagnosis Date Noted   Genetic testing 08/21/2023   Postoperative breast asymmetry 01/31/2020   Hypertension 09/02/2019   OSA (obstructive sleep apnea) 06/17/2019   Back pain 04/29/2018   Neck pain 04/29/2018   Symptomatic mammary hypertrophy 04/29/2018   GAD (generalized anxiety disorder) 11/03/2013   Major psychotic depression, recurrent (HCC) 11/03/2013   Seasonal and perennial allergic rhinitis 02/29/2012   Food allergy  02/29/2012   Medication intolerance 02/29/2012   Asthma, moderate persistent 12/23/2011   Depression 12/09/2011   Anxiety 12/09/2011      Review of Systems  All other systems reviewed and are negative.     Objective:     BP 120/82   Pulse 75   Temp 98.2 F (36.8 C) (Oral)   Ht 5' 5 (1.651 m)   Wt 187 lb 1.6 oz (84.9 kg)   SpO2 98%   BMI 31.14 kg/m  {Vitals History (Optional):23777}  Physical Exam Vitals reviewed.  Constitutional:      Appearance: Normal appearance. She is obese.  Cardiovascular:     Rate and Rhythm: Normal rate and regular rhythm.     Pulses: Normal pulses.  Pulmonary:     Effort: Pulmonary effort is normal.     Breath sounds: Normal breath sounds. No wheezing.  Skin:    Findings: Rash (multiple raised papules on the backs of the thighs BL, some are excoriated, also patches of papules on the backs of her arms, 3 flat patches on her face that are erythematous, no purulence or edema, they appear to be patches denuded of top layer of skin) present.  Neurological:     General: No focal deficit present.     Mental Status: She is alert and oriented to person, place, and time.  Psychiatric:        Mood and Affect: Mood normal.        Behavior:  Behavior normal.      No results found for any visits on 09/17/23.  {Labs (Optional):23779}  The 10-year ASCVD risk score (Arnett DK, et al., 2019) is: 2%    Assessment & Plan:  Psoriasis-like skin disease -     Triamcinolone  Acetonide; Apply 1 Application topically 2 (two) times daily.  Dispense: 30 g; Refill: 2  Primary hypertension -     dilTIAZem  HCl ER Coated Beads; Take 1 capsule (120 mg total) by mouth daily.  Dispense: 30 capsule; Refill: 5 -     Irbesartan ; Take 1 tablet (300 mg total) by mouth daily.  Dispense: 30 tablet; Refill: 5  Immunization due -     Flu vaccine trivalent PF, 6mos and older(Flulaval,Afluria,Fluarix,Fluzone)     Return in about 6 months (around 03/16/2024) for annual physical exam.    Heron CHRISTELLA Sharper, MD

## 2023-09-23 NOTE — Assessment & Plan Note (Signed)
 Current hypertension medications:       Sig   diltiazem  (CARDIZEM  CD) 120 MG 24 hr capsule Take 1 capsule (120 mg total) by mouth daily.   irbesartan  (AVAPRO ) 300 MG tablet Take 1 tablet (300 mg total) by mouth daily.      Chronic, stable, BP is well controlled on the medication listed above, will refill her irbesartan  today.

## 2023-09-25 ENCOUNTER — Telehealth (HOSPITAL_COMMUNITY): Admitting: Psychiatry

## 2023-09-25 ENCOUNTER — Encounter (HOSPITAL_COMMUNITY): Payer: Self-pay | Admitting: Psychiatry

## 2023-09-25 VITALS — Wt 187.0 lb

## 2023-09-25 DIAGNOSIS — F5105 Insomnia due to other mental disorder: Secondary | ICD-10-CM | POA: Diagnosis not present

## 2023-09-25 DIAGNOSIS — F332 Major depressive disorder, recurrent severe without psychotic features: Secondary | ICD-10-CM | POA: Diagnosis not present

## 2023-09-25 DIAGNOSIS — F411 Generalized anxiety disorder: Secondary | ICD-10-CM

## 2023-09-25 DIAGNOSIS — F99 Mental disorder, not otherwise specified: Secondary | ICD-10-CM

## 2023-09-25 MED ORDER — HYDROXYZINE HCL 25 MG PO TABS: 25.0000 mg | ORAL_TABLET | Freq: Every day | ORAL | 0 refills | Status: AC | PRN

## 2023-09-25 MED ORDER — VENLAFAXINE HCL ER 75 MG PO CP24: 225.0000 mg | ORAL_CAPSULE | Freq: Every day | ORAL | 0 refills | Status: DC
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
## 2023-09-29 ENCOUNTER — Encounter: Payer: Self-pay | Admitting: Obstetrics and Gynecology

## 2023-09-29 ENCOUNTER — Other Ambulatory Visit (HOSPITAL_COMMUNITY)
Admission: RE | Admit: 2023-09-29 | Discharge: 2023-09-29 | Disposition: A | Discharge status: Home / Self Care | Source: Ambulatory Visit | Attending: Obstetrics and Gynecology | Admitting: Obstetrics and Gynecology

## 2023-09-29 ENCOUNTER — Ambulatory Visit (INDEPENDENT_AMBULATORY_CARE_PROVIDER_SITE_OTHER): Admitting: Obstetrics and Gynecology

## 2023-09-29 VITALS — BP 126/84 | HR 84

## 2023-09-29 DIAGNOSIS — R87612 Low grade squamous intraepithelial lesion on cytologic smear of cervix (LGSIL): Secondary | ICD-10-CM

## 2023-09-29 DIAGNOSIS — N950 Postmenopausal bleeding: Secondary | ICD-10-CM

## 2023-09-29 NOTE — Patient Instructions (Signed)
 Colposcopy, Care After  The following information offers guidance on how to care for yourself after your procedure. Your doctor may also give you more specific instructions. If you have problems or questions, contact your doctor. What can I expect after the procedure? If you did not have a sample of your tissue taken out (did not have a biopsy), you may only have some spotting of blood for a few days. You can go back to your normal activities. If you had a sample of your tissue taken out, it is common to have: Soreness and mild pain. These may last for a few days. Mild bleeding or fluid (discharge) coming from your vagina. The fluid will look dark and grainy. You may have this for a few days. The fluid may be caused by a liquid that was used during your procedure. You may need to wear a sanitary pad. Spotting of blood for at least 48 hours after the procedure. Follow these instructions at home: Medicines Take over-the-counter and prescription medicines only as told by your doctor. Ask your doctor what over-the-counter pain medicines and prescription medicines you can start taking again. This is very important if you take blood thinners. Activity For at least 3 days, or for as long as told by your doctor, avoid: Douching. Using tampons. Having sex. Return to your normal activities as told by your doctor. Ask your doctor what activities are safe for you. General instructions Ask your doctor if you may take baths, swim, or use a hot tub. You may take showers. If you use birth control (contraception), keep using it. Keep all follow-up visits. Contact a doctor if: You have a fever or chills. You faint or feel light-headed. Get help right away if: You bleed a lot from your vagina. A lot of bleeding means that the bleeding soaks through a pad in less than 1 hour. You have clumps of blood (blood clots) coming from your vagina. You have signs that could mean you have an infection. This may be  fluid coming from your vagina that is: Different than normal. Yellow. Bad-smelling. You have very bad pain or cramps in your lower belly that do not get better with medicine. Summary If you did not have a sample of your tissue taken out, you may only have some spotting of blood for a few days. You can go back to your normal activities. If you had a sample of your tissue taken out, it is common to have mild pain for a few days and spotting for 48 hours. Avoid douching, using tampons, and having sex for at least 3 days after the procedure or for as long as told. Get help right away if you have a lot of bleeding, very bad pain, or signs of infection. This information is not intended to replace advice given to you by your health care provider. Make sure you discuss any questions you have with your health care provider. Document Revised: 05/27/2020 Document Reviewed: 05/27/2020 Elsevier Patient Education  2024 [employer].

## 2023-09-30 LAB — SURGICAL PATHOLOGY

## 2023-10-02 ENCOUNTER — Ambulatory Visit: Payer: Self-pay | Admitting: Obstetrics and Gynecology

## 2023-11-12 ENCOUNTER — Other Ambulatory Visit: Payer: Self-pay | Admitting: Medical Genetics

## 2023-11-12 DIAGNOSIS — Z006 Encounter for examination for normal comparison and control in clinical research program: Secondary | ICD-10-CM

## 2023-11-17 ENCOUNTER — Encounter: Payer: Self-pay | Admitting: Family Medicine

## 2023-11-17 DIAGNOSIS — I10 Essential (primary) hypertension: Secondary | ICD-10-CM

## 2023-11-17 MED ORDER — DILTIAZEM HCL ER COATED BEADS 120 MG PO CP24: 120.0000 mg | ORAL_CAPSULE | Freq: Every day | ORAL | 5 refills | Status: AC

## 2023-11-17 MED ORDER — IRBESARTAN 300 MG PO TABS: 300.0000 mg | ORAL_TABLET | Freq: Every day | ORAL | 5 refills | Status: AC

## 2023-11-19 ENCOUNTER — Encounter (HOSPITAL_COMMUNITY): Payer: Self-pay

## 2023-11-19 ENCOUNTER — Encounter: Payer: Self-pay | Admitting: Internal Medicine

## 2023-11-20 NOTE — Telephone Encounter (Signed)
 I called and spoke with patient, Dr. Curry sent in 90 day orders in September so [facility] does not have any refills to transfer. Patient states she has enough medication to last until her appointment in Dec. Pharmacy in her chart has been updated

## 2023-11-24 ENCOUNTER — Other Ambulatory Visit: Payer: Self-pay | Admitting: Internal Medicine

## 2023-11-24 DIAGNOSIS — J4540 Moderate persistent asthma, uncomplicated: Secondary | ICD-10-CM

## 2023-11-24 MED ORDER — VENTOLIN HFA 108 (90 BASE) MCG/ACT IN AERS: INHALATION_SPRAY | RESPIRATORY_TRACT | 5 refills | Status: AC

## 2023-11-24 NOTE — Telephone Encounter (Signed)
 Copied from CRM #[id number]. Topic: Clinical - Medication Refill >> Nov 24, 2023  9:50 AM [name] wrote: Medication: VENTOLIN  HFA 108 (90 Base) MCG/ACT inhaler [[id number]]  Has the patient contacted their pharmacy? Yes (Agent: If no, request that the patient contact the pharmacy for the refill. If patient does not wish to contact the pharmacy document the reason why and proceed with request.) (Agent: If yes, when and what did the pharmacy advise?)  This is the patient's preferred pharmacy:  [facility] - [place], [place] - [address] [place] [address] Phone: [phone] Fax: [phone]  Is this the correct pharmacy for this prescription? Yes If no, delete pharmacy and type the correct one.   Has the prescription been filled recently? Yes  Is the patient out of the medication? Yes  Has the patient been seen for an appointment in the last year OR does the patient have an upcoming appointment? Yes  Can we respond through MyChart? No  Agent: Please be advised that Rx refills may take up to 3 business days. We ask that you follow-up with your pharmacy.

## 2023-11-25 ENCOUNTER — Telehealth: Payer: Self-pay | Admitting: *Deleted

## 2023-11-25 NOTE — Telephone Encounter (Signed)
 Copied from CRM [id number]. Topic: Clinical - Prescription Issue >> Nov 24, 2023  9:49 AM Nancy Barnes wrote: Reason for CRM: Patient is requesting for all of her prescriptions to be transferred to [facility]    Address: [address] [place], [place], [place] [address] Phone: [phone]  Called the pt and there was no answer- LMTCB

## 2023-11-27 MED ORDER — EPINEPHRINE 0.3 MG/0.3ML IJ SOAJ: 0.3000 mg | INTRAMUSCULAR | 0 refills | Status: AC | PRN

## 2023-11-27 MED ORDER — ADVAIR DISKUS 250-50 MCG/ACT IN AEPB: 1.0000 | INHALATION_SPRAY | Freq: Two times a day (BID) | RESPIRATORY_TRACT | 11 refills | Status: AC

## 2023-11-27 NOTE — Telephone Encounter (Signed)
Ventolin inhaler sent to pharmacy.

## 2023-11-27 NOTE — Telephone Encounter (Signed)
 This has been done in Mychart encounter. Nothing further needed.

## 2023-12-28 ENCOUNTER — Telehealth (HOSPITAL_COMMUNITY): Admitting: Psychiatry

## 2023-12-28 ENCOUNTER — Encounter (HOSPITAL_COMMUNITY): Payer: Self-pay | Admitting: Psychiatry

## 2023-12-28 VITALS — Wt 187.0 lb

## 2023-12-28 DIAGNOSIS — F411 Generalized anxiety disorder: Secondary | ICD-10-CM | POA: Diagnosis not present

## 2023-12-28 DIAGNOSIS — F5105 Insomnia due to other mental disorder: Secondary | ICD-10-CM

## 2023-12-28 DIAGNOSIS — F332 Major depressive disorder, recurrent severe without psychotic features: Secondary | ICD-10-CM

## 2023-12-28 DIAGNOSIS — F99 Mental disorder, not otherwise specified: Secondary | ICD-10-CM | POA: Diagnosis not present

## 2023-12-28 MED ORDER — VENLAFAXINE HCL ER 75 MG PO CP24: 225.0000 mg | ORAL_CAPSULE | Freq: Every day | ORAL | 0 refills | Status: AC
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
## 2023-12-29 ENCOUNTER — Encounter (HOSPITAL_COMMUNITY): Payer: Self-pay

## 2023-12-29 ENCOUNTER — Encounter (HOSPITAL_COMMUNITY): Payer: Self-pay | Admitting: *Deleted

## 2023-12-29 NOTE — Telephone Encounter (Signed)
 Yes go ahead for Genesight testing.

## 2023-12-29 NOTE — Telephone Encounter (Signed)
 Do you want her to get GeneSight tested? She has had ADHD testing at [facility]. I will send information to [facility].

## 2023-12-29 NOTE — Telephone Encounter (Signed)
 ADHD medicines are strong and I will recommend to have testing done before considering taking the medication.  TMS can be a better option at this time while awaiting further test results.  If agree please refer for TMS coordinator to follow-up.

## 2023-12-31 ENCOUNTER — Ambulatory Visit (HOSPITAL_COMMUNITY): Admitting: *Deleted

## 2023-12-31 ENCOUNTER — Telehealth (HOSPITAL_COMMUNITY): Payer: Self-pay | Admitting: *Deleted

## 2023-12-31 DIAGNOSIS — F411 Generalized anxiety disorder: Secondary | ICD-10-CM | POA: Diagnosis not present

## 2023-12-31 DIAGNOSIS — F5105 Insomnia due to other mental disorder: Secondary | ICD-10-CM | POA: Diagnosis not present

## 2023-12-31 DIAGNOSIS — F322 Major depressive disorder, single episode, severe without psychotic features: Secondary | ICD-10-CM | POA: Diagnosis not present

## 2023-12-31 DIAGNOSIS — F331 Major depressive disorder, recurrent, moderate: Secondary | ICD-10-CM

## 2023-12-31 NOTE — Telephone Encounter (Signed)
 Pt in office this morning for GeneSight testing. Consent reviewed and signed. Order placed for [employer] pick up.

## 2024-01-12 LAB — GENECONNECT MOLECULAR SCREEN: Genetic Analysis Overall Interpretation: NEGATIVE

## 2024-03-02 ENCOUNTER — Ambulatory Visit: Admitting: Dermatology

## 2024-03-18 ENCOUNTER — Encounter: Admitting: Family Medicine

## 2024-03-31 ENCOUNTER — Telehealth (HOSPITAL_COMMUNITY): Admitting: Psychiatry

## 2024-07-12 ENCOUNTER — Ambulatory Visit: Admitting: Obstetrics and Gynecology
